# Patient Record
Sex: Female | Born: 1935 | Race: White | Hispanic: No | Smoking: Never smoker
Health system: Southern US, Community
[De-identification: ages and names within clinical notes are randomized; demographics above are authoritative.]

## PROBLEM LIST (undated history)

## (undated) DIAGNOSIS — E039 Hypothyroidism, unspecified: Secondary | ICD-10-CM

## (undated) DIAGNOSIS — M1990 Unspecified osteoarthritis, unspecified site: Secondary | ICD-10-CM

## (undated) DIAGNOSIS — E875 Hyperkalemia: Secondary | ICD-10-CM

## (undated) DIAGNOSIS — I10 Essential (primary) hypertension: Secondary | ICD-10-CM

## (undated) DIAGNOSIS — K449 Diaphragmatic hernia without obstruction or gangrene: Secondary | ICD-10-CM

## (undated) DIAGNOSIS — F419 Anxiety disorder, unspecified: Secondary | ICD-10-CM

## (undated) DIAGNOSIS — I5032 Chronic diastolic (congestive) heart failure: Secondary | ICD-10-CM

## (undated) DIAGNOSIS — I480 Paroxysmal atrial fibrillation: Secondary | ICD-10-CM

## (undated) DIAGNOSIS — D509 Iron deficiency anemia, unspecified: Secondary | ICD-10-CM

## (undated) DIAGNOSIS — E785 Hyperlipidemia, unspecified: Secondary | ICD-10-CM

## (undated) DIAGNOSIS — E669 Obesity, unspecified: Secondary | ICD-10-CM

## (undated) DIAGNOSIS — K219 Gastro-esophageal reflux disease without esophagitis: Secondary | ICD-10-CM

## (undated) DIAGNOSIS — E559 Vitamin D deficiency, unspecified: Secondary | ICD-10-CM

## (undated) HISTORY — DX: Vitamin D deficiency, unspecified: E55.9

## (undated) HISTORY — DX: Anxiety disorder, unspecified: F41.9

## (undated) HISTORY — DX: Unspecified osteoarthritis, unspecified site: M19.90

## (undated) HISTORY — DX: Obesity, unspecified: E66.9

## (undated) HISTORY — DX: Diaphragmatic hernia without obstruction or gangrene: K44.9

## (undated) HISTORY — DX: Hyperlipidemia, unspecified: E78.5

## (undated) HISTORY — DX: Essential (primary) hypertension: I10

## (undated) HISTORY — DX: Gastro-esophageal reflux disease without esophagitis: K21.9

## (undated) HISTORY — PX: CHOLECYSTECTOMY: SHX55

## (undated) HISTORY — DX: Paroxysmal atrial fibrillation: I48.0

---

## 1958-12-10 HISTORY — PX: TUBAL LIGATION: SHX77

## 1999-06-05 ENCOUNTER — Other Ambulatory Visit: Admission: RE | Admit: 1999-06-05 | Discharge: 1999-06-05 | Payer: Self-pay | Admitting: Family Medicine

## 2001-02-17 ENCOUNTER — Encounter: Payer: Self-pay | Admitting: Family Medicine

## 2001-02-17 ENCOUNTER — Ambulatory Visit (HOSPITAL_COMMUNITY): Admission: RE | Admit: 2001-02-17 | Discharge: 2001-02-17 | Payer: Self-pay | Admitting: Family Medicine

## 2001-12-23 ENCOUNTER — Other Ambulatory Visit: Admission: RE | Admit: 2001-12-23 | Discharge: 2001-12-23 | Payer: Self-pay | Admitting: Family Medicine

## 2005-04-20 ENCOUNTER — Encounter: Admission: RE | Admit: 2005-04-20 | Discharge: 2005-04-20 | Payer: Self-pay | Admitting: Family Medicine

## 2005-11-28 ENCOUNTER — Encounter: Admission: RE | Admit: 2005-11-28 | Discharge: 2005-11-28 | Payer: Self-pay | Admitting: Family Medicine

## 2006-04-22 ENCOUNTER — Ambulatory Visit: Payer: Self-pay | Admitting: Gastroenterology

## 2006-05-10 ENCOUNTER — Ambulatory Visit: Payer: Self-pay | Admitting: Gastroenterology

## 2006-05-31 ENCOUNTER — Encounter: Admission: RE | Admit: 2006-05-31 | Discharge: 2006-05-31 | Payer: Self-pay | Admitting: Family Medicine

## 2007-06-02 ENCOUNTER — Encounter: Admission: RE | Admit: 2007-06-02 | Discharge: 2007-06-02 | Payer: Self-pay | Admitting: Family Medicine

## 2007-09-06 ENCOUNTER — Emergency Department (HOSPITAL_COMMUNITY): Admission: EM | Admit: 2007-09-06 | Discharge: 2007-09-06 | Payer: Self-pay | Admitting: Emergency Medicine

## 2008-03-08 ENCOUNTER — Inpatient Hospital Stay (HOSPITAL_COMMUNITY): Admission: EM | Admit: 2008-03-08 | Discharge: 2008-03-17 | Payer: Self-pay | Admitting: Emergency Medicine

## 2008-03-12 ENCOUNTER — Ambulatory Visit: Payer: Self-pay | Admitting: Vascular Surgery

## 2008-03-12 ENCOUNTER — Encounter (INDEPENDENT_AMBULATORY_CARE_PROVIDER_SITE_OTHER): Payer: Self-pay | Admitting: *Deleted

## 2008-06-23 ENCOUNTER — Encounter: Admission: RE | Admit: 2008-06-23 | Discharge: 2008-06-23 | Payer: Self-pay | Admitting: Obstetrics and Gynecology

## 2008-09-22 ENCOUNTER — Ambulatory Visit (HOSPITAL_COMMUNITY): Admission: RE | Admit: 2008-09-22 | Discharge: 2008-09-22 | Payer: Self-pay | Admitting: Family Medicine

## 2009-02-01 IMAGING — CR DG CHEST 2V
2 series · 2 of 2 positions shown · non-contrast
Comparison: [DATE]

CLINICAL DATA: Cough, congestion, hypertension

CHEST - 2 VIEW

[w chest lat *]
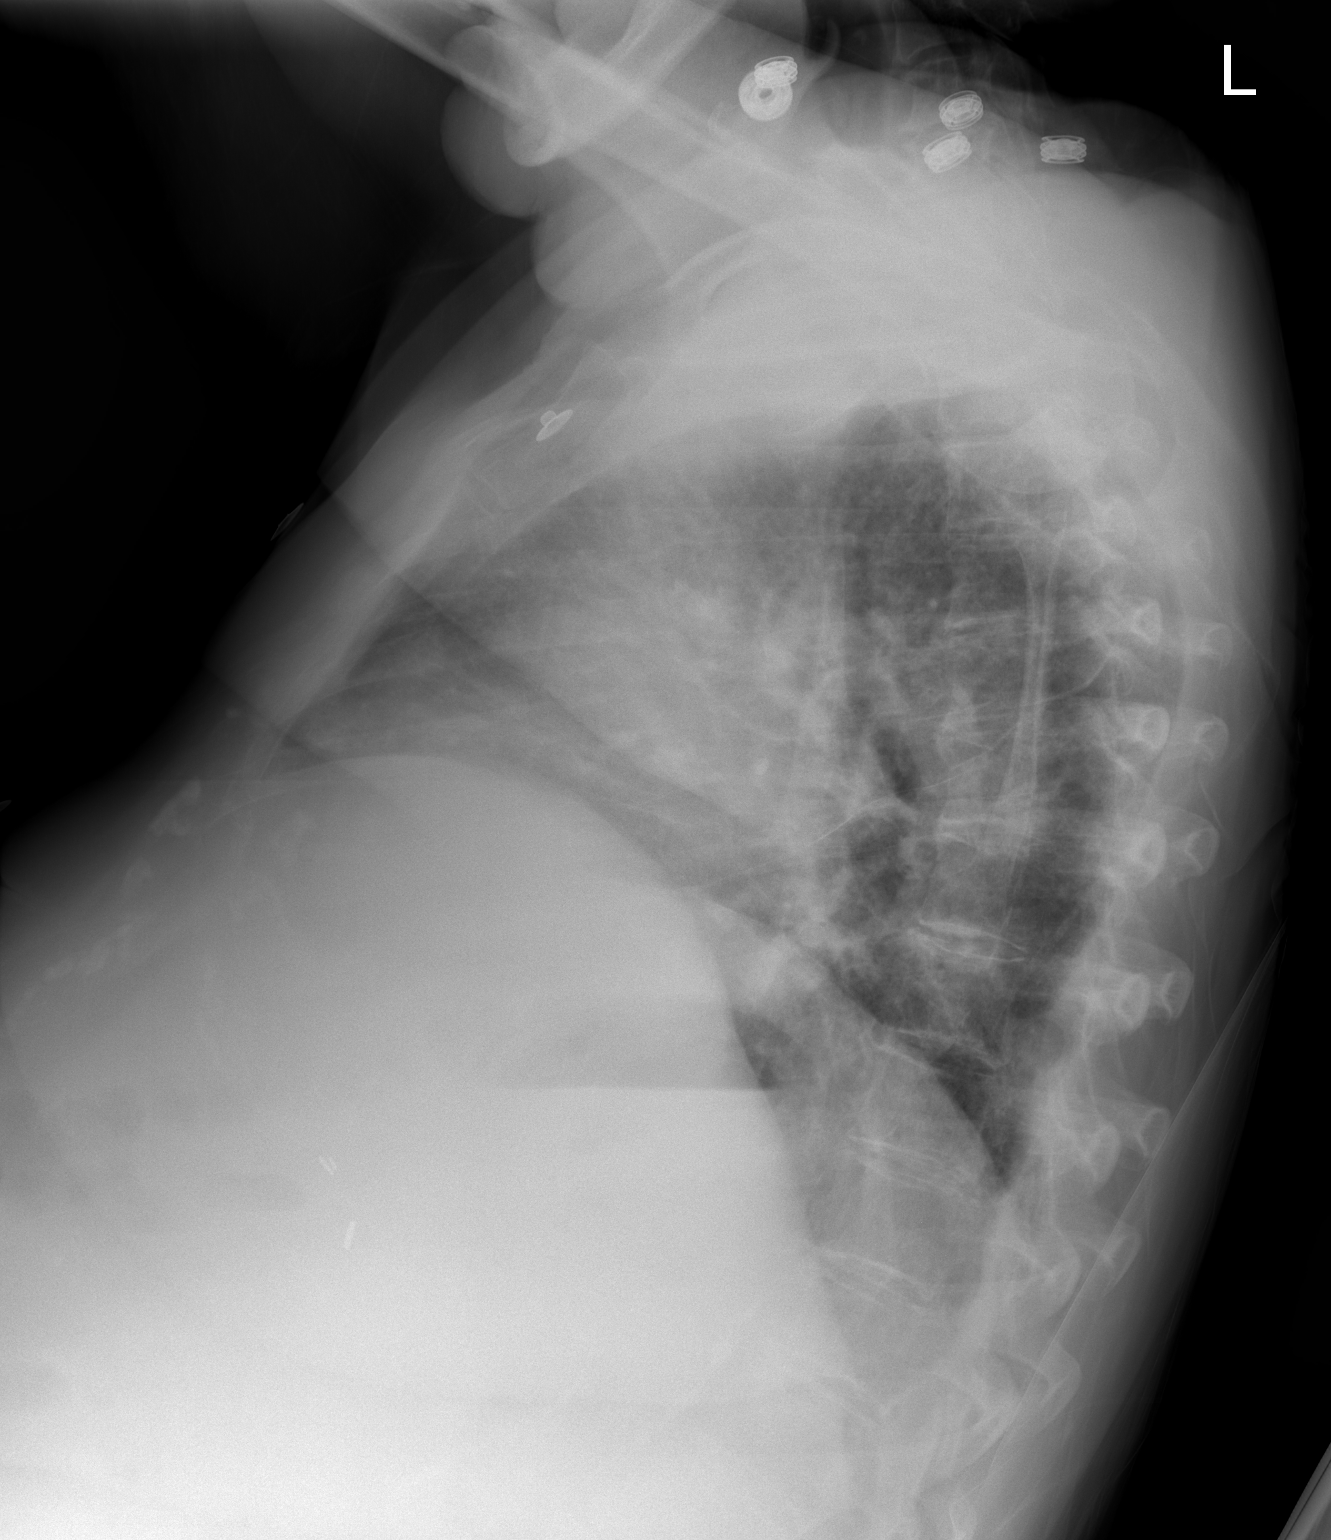

[view not recorded]
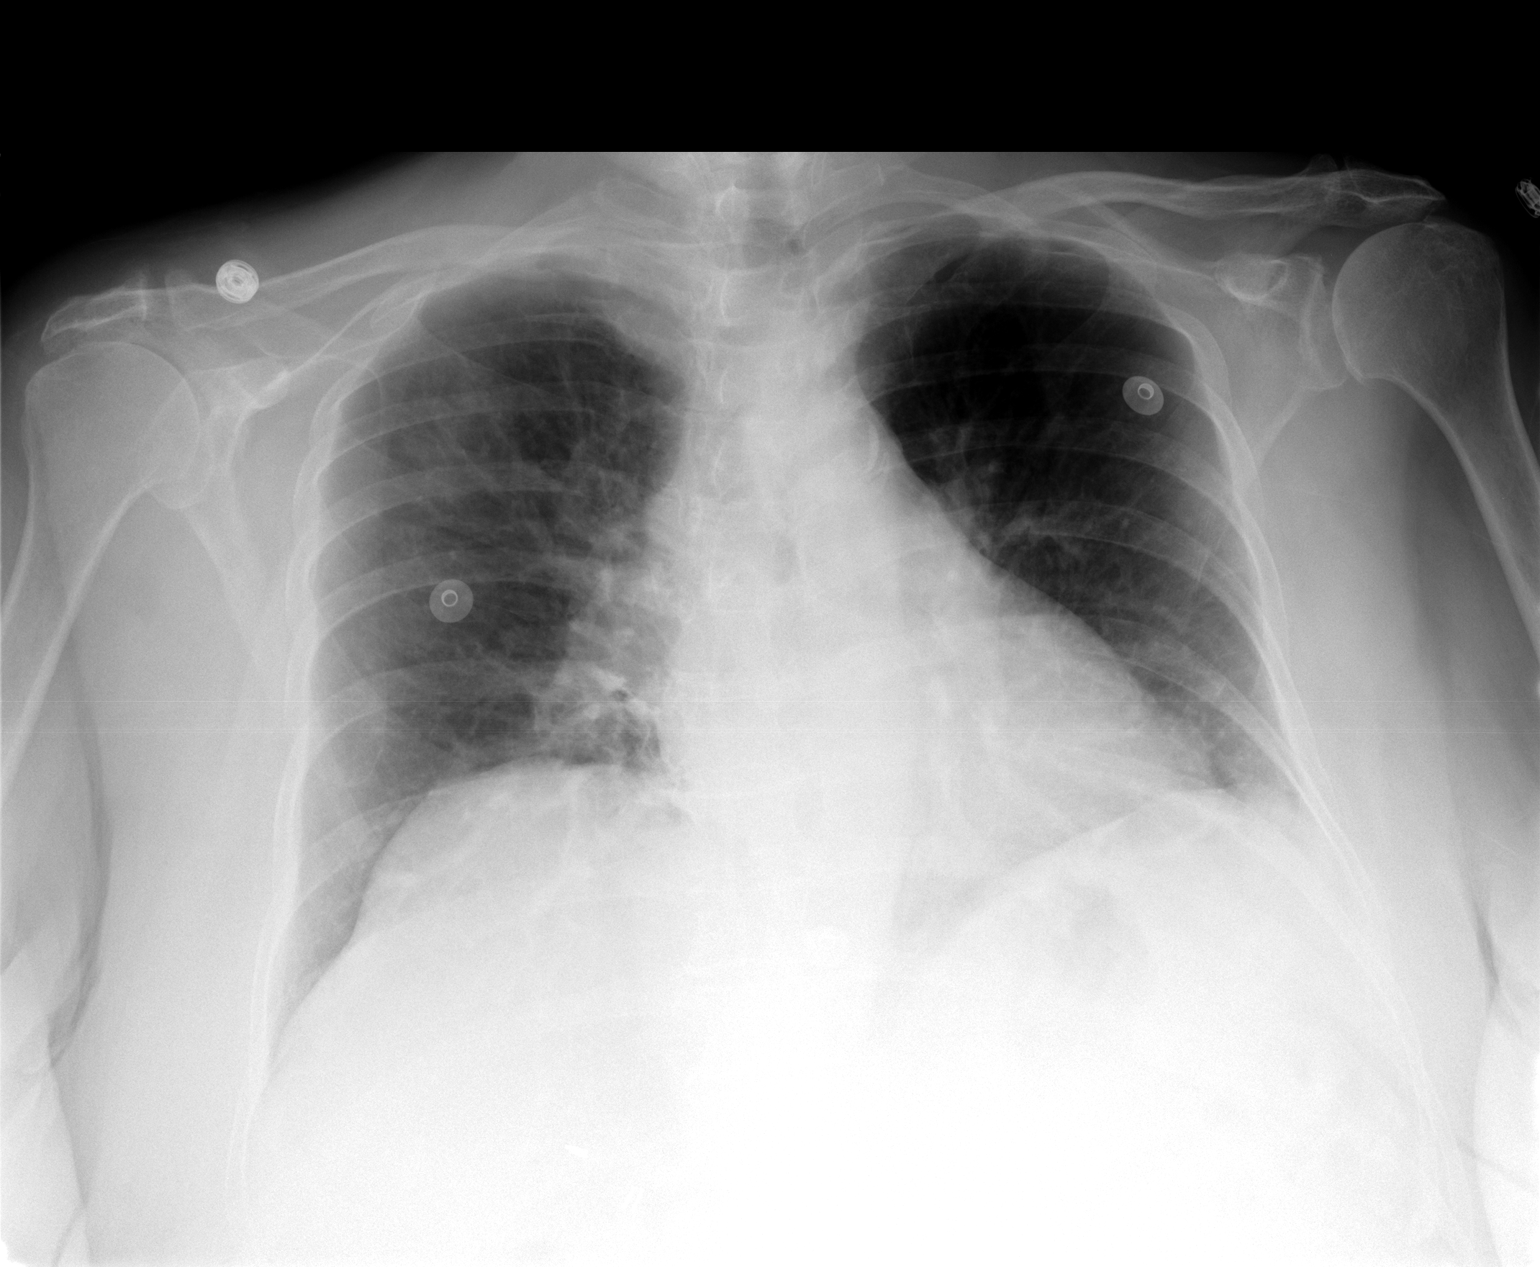

[2 of 2 positions shown; findings below may reference images not displayed]

FINDINGS: Low lung volumes, accentuating the cardiac and hilar
vascular markings.  Stable prominence of the right hilum.  Slight
increased density in the right upper lobe, developing infiltrate is
not excluded.  Bibasilar atelectasis..
IMPRESSION: Low volume exam.  Right upper lobe increased density, suspect
developing infiltrate or pneumonia.

Bibasilar atelectasis..

## 2009-06-27 ENCOUNTER — Encounter: Admission: RE | Admit: 2009-06-27 | Discharge: 2009-06-27 | Payer: Self-pay | Admitting: Obstetrics and Gynecology

## 2009-07-07 ENCOUNTER — Inpatient Hospital Stay (HOSPITAL_COMMUNITY): Admission: EM | Admit: 2009-07-07 | Discharge: 2009-07-11 | Payer: Self-pay | Admitting: Emergency Medicine

## 2010-12-31 ENCOUNTER — Encounter: Payer: Self-pay | Admitting: Obstetrics and Gynecology

## 2011-03-17 LAB — PROTIME-INR
INR: 2.2 — ABNORMAL HIGH (ref 0.00–1.49)
INR: 2.5 — ABNORMAL HIGH (ref 0.00–1.49)
Prothrombin Time: 29.1 seconds — ABNORMAL HIGH (ref 11.6–15.2)

## 2011-03-17 LAB — BASIC METABOLIC PANEL
BUN: 4 mg/dL — ABNORMAL LOW (ref 6–23)
BUN: 6 mg/dL (ref 6–23)
CO2: 22 mEq/L (ref 19–32)
Calcium: 8.7 mg/dL (ref 8.4–10.5)
Chloride: 103 mEq/L (ref 96–112)
Creatinine, Ser: 0.83 mg/dL (ref 0.4–1.2)
Creatinine, Ser: 0.95 mg/dL (ref 0.4–1.2)
GFR calc Af Amer: >60 mL/min (ref >60)
Glucose, Bld: 95 mg/dL (ref 70–99)
Glucose, Bld: 95 mg/dL (ref 70–99)
Sodium: 134 mEq/L — ABNORMAL LOW (ref 135–145)

## 2011-03-17 LAB — CBC
MCV: 86.5 fL (ref 78.0–100.0)
WBC: 4.4 10*3/uL (ref 4.0–10.5)

## 2011-03-18 LAB — COMPREHENSIVE METABOLIC PANEL
ALT: 22 U/L (ref 0–35)
AST: 31 U/L (ref 0–37)
Alkaline Phosphatase: 76 U/L (ref 39–117)
CO2: 26 mEq/L (ref 19–32)
Calcium: 9 mg/dL (ref 8.4–10.5)
Chloride: 78 mEq/L — CL (ref 96–112)
GFR calc Af Amer: 58 mL/min — ABNORMAL LOW (ref >60)
Glucose, Bld: 95 mg/dL (ref 70–99)
Total Protein: 7 g/dL (ref 6.0–8.3)

## 2011-03-18 LAB — BASIC METABOLIC PANEL
BUN: 10 mg/dL (ref 6–23)
BUN: 19 mg/dL (ref 6–23)
CO2: 29 mEq/L (ref 19–32)
CO2: 30 mEq/L (ref 19–32)
Calcium: 8.4 mg/dL (ref 8.4–10.5)
Calcium: 8.9 mg/dL (ref 8.4–10.5)
Calcium: 8.9 mg/dL (ref 8.4–10.5)
Calcium: 9 mg/dL (ref 8.4–10.5)
Calcium: 9.2 mg/dL (ref 8.4–10.5)
Chloride: 78 mEq/L — CL (ref 96–112)
Creatinine, Ser: 1 mg/dL (ref 0.4–1.2)
Creatinine, Ser: 1.04 mg/dL (ref 0.4–1.2)
GFR calc Af Amer: >60 mL/min (ref >60)
GFR calc Af Amer: >60 mL/min (ref >60)
GFR calc non Af Amer: 51 mL/min — ABNORMAL LOW (ref >60)
GFR calc non Af Amer: 54 mL/min — ABNORMAL LOW (ref >60)
GFR calc non Af Amer: 54 mL/min — ABNORMAL LOW (ref >60)
GFR calc non Af Amer: 55 mL/min — ABNORMAL LOW (ref >60)
GFR calc non Af Amer: >60 mL/min (ref >60)
Glucose, Bld: 97 mg/dL (ref 70–99)
Glucose, Bld: 98 mg/dL (ref 70–99)
Potassium: 3.2 mEq/L — ABNORMAL LOW (ref 3.5–5.1)
Potassium: 3.5 mEq/L (ref 3.5–5.1)
Potassium: 3.6 mEq/L (ref 3.5–5.1)
Potassium: 4.6 mEq/L (ref 3.5–5.1)
Potassium: 4.6 mEq/L (ref 3.5–5.1)
Sodium: 116 mEq/L — CL (ref 135–145)
Sodium: 117 mEq/L — CL (ref 135–145)
Sodium: 120 mEq/L — ABNORMAL LOW (ref 135–145)
Sodium: 124 mEq/L — ABNORMAL LOW (ref 135–145)
Sodium: 127 mEq/L — ABNORMAL LOW (ref 135–145)
Sodium: 131 mEq/L — ABNORMAL LOW (ref 135–145)

## 2011-03-18 LAB — CARDIAC PANEL(CRET KIN+CKTOT+MB+TROPI)
CK, MB: 1.6 ng/mL (ref 0.3–4.0)
CK, MB: 2 ng/mL (ref 0.3–4.0)
Relative Index: INVALID (ref 0.0–2.5)
Total CK: 48 U/L (ref 7–177)
Total CK: 59 U/L (ref 7–177)
Troponin I: 0.02 ng/mL (ref 0.00–0.06)

## 2011-03-18 LAB — DIC (DISSEMINATED INTRAVASCULAR COAGULATION)PANEL
Fibrinogen: 485 mg/dL — ABNORMAL HIGH (ref 204–475)
aPTT: 71 seconds — ABNORMAL HIGH (ref 24–37)

## 2011-03-18 LAB — AMMONIA: Ammonia: 29 umol/L (ref 11–35)

## 2011-03-18 LAB — CBC
HCT: 25.4 % — ABNORMAL LOW (ref 36.0–46.0)
HCT: 30.3 % — ABNORMAL LOW (ref 36.0–46.0)
Hemoglobin: 8.7 g/dL — ABNORMAL LOW (ref 12.0–15.0)
MCHC: 34 g/dL (ref 30.0–36.0)
Platelets: 151 10*3/uL (ref 150–400)
Platelets: 154 10*3/uL (ref 150–400)
RBC: 3.56 MIL/uL — ABNORMAL LOW (ref 3.87–5.11)
RDW: 16.8 % — ABNORMAL HIGH (ref 11.5–15.5)
RDW: 16.9 % — ABNORMAL HIGH (ref 11.5–15.5)
WBC: 4.1 10*3/uL (ref 4.0–10.5)

## 2011-03-18 LAB — PROTIME-INR
INR: 2.5 — ABNORMAL HIGH (ref 0.00–1.49)
Prothrombin Time: 36.2 seconds — ABNORMAL HIGH (ref 11.6–15.2)

## 2011-03-18 LAB — HEMOCCULT GUIAC POC 1CARD (OFFICE): Fecal Occult Bld: POSITIVE

## 2011-03-18 LAB — CORTISOL: Cortisol, Plasma: 16 ug/dL

## 2011-04-24 NOTE — H&P (Signed)
Katherine Tyler, Katherine Tyler                   ACCOUNT NO.:  [id number]   MEDICAL RECORD NO.:  [id number]          PATIENT TYPE:  EMS   LOCATION:  ED                           FACILITY:  [facility]   PHYSICIAN:  Lucita Ferrara, MD         DATE OF BIRTH:  [birth year]   DATE OF ADMISSION:  03/08/2008  DATE OF DISCHARGE:                              HISTORY & PHYSICAL   PRIMARY CARE PHYSICIAN:  Dr. Gennette Pac at [facility], last seen 1 month ago.   CHIEF COMPLAINT:  Falls and weakness, abdominal pain.   HISTORY OF PRESENT ILLNESS:  Katherine Tyler is a 77 year old female brought in  by her family members to [facility] with chief complaint  of worsening left lower abdominal pain that has been going on the not  for the last 3 days.  She has also been complaining of falls that has  intractable for the last 1 month. Falls accompanied by generalized  weakness, most occur after she suddenly gets out of her chair and she  denies any prodrome such as chest pain, shortness of breath.  There is  no noted or witnessed seizure activity such as a urinary incontinence  bowel incontinence, foaming at the mouth, or seizure-type activity.  There is also no focal weakness on one side of the body, and no changes  in her visual acuity or her speech content or character.  She states  that she has been falling mostly on her tail bone and her buttocks.  She  denies hitting her head.  She was last seen 1 month ago for generalized  check-up.  She denies a history of anemia.  She states that she is a  chronic drinker.  She has been drinking for the last 30-40 years.  30  years ago, apparently she had been diagnosed with having cirrhosis of  the liver.  This is what was told to her.  She never followed up for  complications secondary to cirrhosis.  She has never had an EGD  colonoscopy.  She denies a history of hemorrhoids she also denies any  fevers, chills.  She states that she has had some  diarrhea stools.  They  also have been black in color.  She denies hematemesis.  She also denies  coffee ground vomitus.  Otherwise 12 point review of systems is  negative.   PAST MEDICAL HISTORY:  1. Significant for hypertension.  2. Gastroesophageal reflux disease.  3. Anxiety.  4. Chronic alcohol intake.  She states she takes so she drinks a half      a pint of alcohol per day.  She is also taking aspirin 81 mg per      day.   ALLERGIES:  No known drug allergies.   MEDICATIONS:  Include Diovan hydrochlorothiazide 160/12.5 one tablet  p.o. daily, Nexium 40 mg daily, Xanax 0.5 mg twice a day, aspirin 81 mg  once daily.   ALLERGIES:  She has no known drug allergies.   SOCIAL HISTORY:  She denies drugs.  She also  denies tobacco.  She drinks  1/2 pint per day.   PAST SURGICAL HISTORY:  There is no mention of any surgeries in the  past.   PHYSICAL EXAMINATION:  Generally, she is a pleasant obese Caucasian  female in no acute distress.  She is talkative and alert, oriented x3,  answers questions appropriately.  Her blood pressure is 130/64, pulse 99, respirations 20, temperature  97.5.  HEENT: Mucous membranes were dry, extraocular muscles intact.  CARDIOVASCULAR:  S1, S2.  Regular rhythm with no rubs or clicks.  Abdomen is the is soft with tenderness generalized with no distention.  There is no hepatosplenomegaly.  RECTAL:  Sphincter tone is good.  Stool is dark in color.  Guaiac is  positive.  NEURO:  Patient is alert, oriented x3.  Cranial nerves 2-12 grossly  intact.  Strength is 5/5 bilateral upper and lower extremities.   EKG shows sinus tachycardia with the right bundle-branch block and T-  wave abnormalities in the lateral wall nonspecific.  ST-T wave changes.  Urinalysis shows negative leukocyte esterase and nitrites positive 100  blood.  Complete metabolic panel sodium 131, potassium 4.4, chloride 90,  CO2 18, BUN 59, creatinine 7.18, alk phos 140, a total bili was  4.6,  albumin low at 2.63, ALT 1151 and calcium is 8.7.  CBC shows a white  count of 8.0 hemoglobin 0.7, hematocrit 30.2 and platelets of 156,000.   ASSESSMENT/PLAN:  A 77 year old; nausea, vomiting, dehydration and  prerenal azotemia, metabolic derangement consistent with non-metabolic  acidosis is being admitted for weakness and falls.  1. Nausea, vomiting and black stools with diarrhea.  Given history of      alcohol abuse and current alcohol intake go ahead and rule out GI      bleed.  We will go ahead and hydrate the patient and repeat H&H,      type and cross and hold 2 units of packed red blood cells.  Check      INR as p.o. as needed.  IV fluids, strict Is and Os, IV antiemetics      Protonix 40 b.i.d. stool for C diff given diarrhea, abdominal      ultrasound and stool for occult blood.  2. Acute renal failure secondary to volume contraction/depletion and      the patient being on hydrochlorothiazide as a contributing factor.      Will go then hold the ACE and/or ARB.  No contrast over nephrotoxic      agents bilateral renal ultrasound strict Is and Os, daily weights      and if creatinine shows no improvement and/or patient's oliguric,      will proceed with a nephrology consultation.  3. Alcohol abuse of 1/2 pints per day.  Ativan protocol initiated and      [facility] 4/4, will go ahead and for completeness check urine drug      screen and blood alcohol level.  Obviously fall precautions.  4. Weakness/falls.  This could really be multifactorial, could be      secondary to #1, #2, or #3 such as the patient is intoxicated.      Will proceed with the PT/OT once the patient is stable.  5. If the patient truly does have a significant anemia and stool for      guaiac is positive the patient will need GI consultation during      this admission.  6. Deep vein thrombosis prophylaxis with a SCD, and GI prophylaxis  with Protonix.  Code status and ethics the patient is Full  Code.      Lucita Ferrara, MD  Electronically Signed     RR/MEDQ  D:  03/08/2008  T:  03/08/2008  Job:  [id number]

## 2011-04-24 NOTE — Discharge Summary (Signed)
Katherine Tyler, Katherine Tyler                   ACCOUNT NO.:  [id number]   MEDICAL RECORD NO.:  [id number]          PATIENT TYPE:  INP   LOCATION:  1433                         FACILITY:  [facility]   PHYSICIAN:  Mobolaji B. Bakare, M.D.DATE OF BIRTH:  11/30/1929   DATE OF ADMISSION:  03/08/2008  DATE OF DISCHARGE:  03/16/2008                               DISCHARGE SUMMARY   PRIMARY CARE PHYSICIAN:  Unassigned;  Dr. Gennette Pac at [facility].   FINAL DIAGNOSES:  1. Alcohol withdrawal.  2. Nausea, vomiting, diarrhea.  3. Alcoholic hepatitis.  4. Acute renal failure, resolved.  5. Microcytic anemia secondary to alcohol.  6. Thrombocytopenia secondary to alcohol.  7. Right knee gout-pseudogout.  8. Degenerative joint disease, right knee.  9. Probable right upper lobe pneumonia.  10.Hypokalemia.  11.Hypomagnesemia.  12.Left lower extremity deep vein thrombosis.  13.Asymptomatic gallstones.  14.Prolonged QTC interval.  15.Probable endometrial polyp.   The patient is to follow up with Dr. Sandria Manly ( [facility], telephone number [phone]).   PROCEDURES:  1. X-ray lumbar spine showed spondylosis without fracture or      subluxation.  2. X-ray of the pelvis shows no acute findings, degenerative changes      in the spine and hips.  3. Ultrasound of the kidneys showed no hydronephrosis, bilateral renal      cortical thinning, simple or mildly complex cyst in the right      kidney.  There is fluid in the endometrial canal with possible      polyp.  4. Chest x-ray done on March 10, 2008, showed no acute abnormality.  5. Ultrasound of the abdomen done on March 11, 2008, showed      splenomegaly and probable hepatomegaly.  There was no      hydronephrosis, possible gallstones. The gallbladder is not well      visualized.  6. Ultrasound of the pelvis showed irregular polypoid-like lesion      arising from the endometrium.  Endometritis cannot be  excluded      neither endometrial polyp. The ovid cannot identified.  There is      ascites.  7. Followup chest x-ray done on March 14, 2008, showed limited      examination right upper lobe increased density, suspect developing      infiltrate or pneumonia.  8. Right knee x-ray done on March 15, 2008, showed moderate to large      knee joint effusion, tricompartment degenerative changes with      possible large loose body anteriorly. No acute findings      demonstrated.  9. Lower extremity Dopplers done on March 12, 2008, showed DVT in the      popliteal posterior tibial veins on the left. No evidence of DVT on      the right.   BRIEF HISTORY:  Please refer to the admission H&P dictated by Dr. Flonnie Overman.  In brief, Katherine Tyler is a 77 year old Caucasian female who presented with  nausea, vomiting, dehydration, acute renal failure and acidosis.  She  was falling  at home just prior to hospitalization.  She was extremely  weak.  The patient drinks alcohol about 1/2 pint of liquor a day.  She  was in incipient alcohol withdrawal at the time of admission.  She  eventually went into full withdrawal during the course of  hospitalization with tachycardia and confusion.   HOSPITAL COURSE:  #1.  NAUSEA, VOMITING, DIARRHEA:  This was managed symptomatically with  IV fluid and Phenergan. Stool samples were sent, and occult blood was  negative.  Unfortunately,  no C. difficile was obtainable.  Nausea,  vomiting and diarrhea have resolved with symptomatic management.  This  is probably related to a viral infection.   #2.  ACUTE RENAL FAILURE:  The patient responded well to IV fluids.  She  had non-anion gap metabolic acidosis.  She was given bicarb infusion.  At the time of dictation, BUN was 14, creatinine 1.15, essentially  normal.  The patient was well hydrated.   #3.  ALCOHOLIC HEPATITIS:  She had abnormal liver enzymes, and this was  in keeping with alcohol-induced abnormality.  She had ultrasound  of the  gallbladder which showed probable gallstones, but CBD was normal.  Hepatitis panel was negative for hepatitis B and C serology.  At the  time of dictation, AST and ALT have improved to 93 and 40, respectively.  Total bilirubin is 2.2, and alkaline phosphatase is normal at 106.   #4.  BI-CYTOPENIA:  The patient had an anemia and thrombocytopenia, both  felt to be secondary to alcohol.  She had an anemia panel which showed  ferritin of 804 and TIBC of 172, normal iron of 94, percent saturation  was 55.  Vitamin B12 was 778, and folate was greater than 20.   #5.  ALCOHOL WITHDRAWAL:  The patient was placed on Ativan withdrawal  protocol.  She did fairly well, and tachycardia has subsided.  She was  placed on multivitamin, thiamine as well.  She will continue with these  as outpatient.  She no will need followup alcohol rehabilitation.   #6.  RIGHT KNEE DEGENERATIVE JOINT DISEASE, GOUT-PSEUDOGOUT:  The  patient developed acute right knee swelling, pain, and warmth.  X-ray of  the knee indicated degenerative joint disease with probable loose body.  She had light effusion.  At this time, the patient was fully  anticoagulated; hence, aspiration of the joint was not done.  She was  started on IV Solu-Medrol 20 mg q.6 h, and this will be transitioned to  a short course of prednisone.  She is also on colchicine 4.6 mg daily.   #7.  ELECTROLYTE IMBALANCE:  she had hypokalemia and hypomagnesemia.  These were corrected. In addition, she was noted to have prolonged QTC  interval, and this is trending downwards.  At the time of admission, the  patient's QTC interval was 630; the most recent QTC interval done on  April 5 was 513.  The patient is asymptomatic.   #8.  LEFT LOWER EXTREMITY DEEP VEIN THROMBOSIS:  The patient was noted  to have increased swelling in her left lower extremity.  An ultrasound  revealed popliteal and tibial vein deep vein thrombosis.  The patient  was started on  Lovenox and Coumadin.  At the time of dictation,  PT and  INR were 37.5 and 3.6, respectively.  Coumadin is being held today.  Further adjustment to Coumadin will be made prior to discharge. The  patient will need 3-6 months of treatment at the discretion of his  primary care physician.   #9.  ENDOMETRIAL POLYP:  This was noted on routine ultrasound of the  abdomen, and this was followed up by specific ultrasound of the pelvis.  Endometrial polyp was suspected, but endometrial cancer could not be  excluded.  Hence, discussion was held with Dr. Sandria Manly. on call for Wonda Olds on this particular day [facility],  telephone number [phone]).  Patient is to follow up with him in 1-2  weeks post discharge.  There would be sufficient coordination in view of  anticoagulation before any procedure can be done.   #10.  PROBABLE RIGHT UPPER LOBE PNEUMONIA:  The patient has abnormal  lung sounds; hence,  chest x-ray was obtained.  There was a subtle  change from previous chest x-ray suspicious for developing new  infiltrate.  She was started on Zosyn.  This will be transitioned to  Augmentin to complete 7 days of treatment.   DISPOSITION:  The patient was seen by PT and OT, and recommendation a  skilled nursing facility placement.   DISCHARGE MEDICATIONS:  1. Xanax 0.5 mg p.o. b.i.d.  2. Colchicine 0.6 mg p.o. daily.  3. Prednisone 40 mg daily through March 19, 2008.  4. Multivitamin one p.o. daily.  5. Protonix 40 mg p.o. daily.  6. Augmentin 875 mg p.o. b.i.d. through March 21, 2008.  7. Thiamine 100 mg daily.  8. Coumadin dosing to be included at the time of discharge.  9. Albuterol 2.5 mg q. 2 h p.r.n.  10.Vicodin one to two p.o. q. 4 h p.r.n. pain.   DISCHARGE LABORATORY DATA:  Magnesium 1.8, potassium 4.4. White cell  4.2, hemoglobin 9.8, hematocrit 28, MCV 104, platelets 68. Acute  hepatitis panel negative.  Uric acid level 9.8.  Sodium 137, chloride  103, CO2  26, glucose 95, BUN 14, creatinine 1.15.  Alkaline phosphatase  109, total bilirubin 2.2, AST 93, ALT 42, total protein 12.2,  albumin  2.1, calcium 7.3.  Fecal occult blood was negative.   RECOMMENDATIONS:  1. Continue PT, OT at the skilled nursing facility.  2. Check BMET and magnesium 1 week post discharge.  3. Follow up with Dr. Sandria Manly at [facility],      telephone number [phone], two  weeks post discharge.      Mobolaji B. Corky Downs, M.D.  Electronically Signed     MBB/MEDQ  D:  03/16/2008  T:  03/16/2008  Job:  [id number]   cc:   Dr. Gennette Pac  [facility]

## 2011-04-24 NOTE — Discharge Summary (Signed)
Katherine Tyler, Katherine Tyler                   ACCOUNT NO.:  [id number]   MEDICAL RECORD NO.:  [id number]          PATIENT TYPE:  INP   LOCATION:  3004                         FACILITY:  [facility]   PHYSICIAN:  Lonia Blood, M.D.DATE OF BIRTH:  Mar 30, 1929   DATE OF ADMISSION:  07/07/2009  DATE OF DISCHARGE:  07/11/2009                               DISCHARGE SUMMARY   PRIMARY CARE PHYSICIAN:  [facility].   DISCHARGE DIAGNOSES:  1. Severe hypokalemia ischemia severe hyponatremia.      a.     Sodium 113 initially.      b.     Felt to be dehydration and diuretic-therapy related.      c.     Completely resolved with sodium 134 at discharge.      d.     Diuretic therapy discontinued.  2. Deep venous thrombosis, on chronic Coumadin therapy.  3. Hypertension.  4. Gout.  5. Status post cholecystectomy.  6. 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      a.     Discontinued alcohol abuse 1 year ago.      b.     LFTs normal.  7. Presbyesophagus:  Calcium channel blocker trial initiated.  8. Normocytic anemia.      a.     Studies suggestive of iron deficiency.      b.     Hemoglobin 8.7 but stable.      c.     Guaiac positive.      d.     Recommended outpatient screening colonoscopy if not       accomplished within the past 2 years.  9. Allergy to [facility].   DISCHARGE MEDICATIONS:  1. Cardizem CD 180 mg p.o. daily  2. Vitamin B1 100 mg p.o. daily.  3. RioCite Plus 1 capsule p.o. daily  4. Coumadin 3 mg on Tuesday, Thursday, Saturday and Sunday and 4.5 mg      on Monday, Wednesday, Friday.  5. Hydrocodone/acetaminophen 5/500 one p.o. q.6h. p.r.n. severe low      back pain.  6. Alprazolam 0.5 mg p.o. t.i.d. p.r.n. anxiety.  7. Hyoscyamine 0.125 mg p.o. t.i.d.  8. Magnesium oxide 400 mg p.o. daily.  9. Colchicine 0.6 mg p.o. daily   FOLLOW UP:  The patient is advised to follow up with her primary care  physician at [facility] family practice in 3 to 5 days.   It is  recommended that a BMET be obtained at that time to reassess the  patient's sodium.  As discussed above, it is 134 at the time of her  discharge.  Additionally, the patient's INR will need to be assessed at  that time, given her chronic Coumadin therapy.  Furthermore,  arrangements should be made to schedule an outpatient colonoscopy for  evaluation of this patient's guaiac-positive normocytic anemia unless  the patient has undergone a recent colonoscopy in the outpatient  setting.   PROCEDURE:  Barium esophagram July 08, 2009:  Esophageal dysmotility  most consistent with presbyesophagus.  Small hiatal hernia, mild  gastroesophageal reflux.  CONSULTATIONS:  None.   HOSPITAL COURSE:  Ms. Katherine Tyler is a very pleasant 77 year old female  who presented to her primary care physician with complaints of  generalized lethargy.  Lab values were obtained through the office, and  the patient was found to have a sodium of 113.  She was therefore  contacted and told to report to the emergency room at the hospital.  In  the emergency room, the patient complained of ongoing severe lethargy.  She was admitted to the hospital for acute evaluation and treatment of  her hyponatremia.   LABORATORY STUDIES:  Osmolality was assessed.  Ultimately final  diagnosis was a severe hyponatremia due to volume depletion as well as  ongoing diuretic therapy.  All diuretics were discontinued.  The patient  was volume-resuscitated using crystalloid isotonic solution.  The  patient tolerated this without difficulty.  The patient's sodium level  improved nicely.  The patient was followed to assure that her sodium did  not correct greater than 12 mEq per 24-hour period.  With improvement of  the patient's sodium to greater than 130, the patient's fatigue  resolved.  She was able to ambulate without difficulty.  Mental status  was at baseline.  The patient, though originally orthostatic,  experienced no  further orthostasis or hypotension after her volume was  replaced.   The patient does have a history of chronic DVT and is on chronic  Coumadin therapy.  Throughout the hospital stay, no complications were  encountered concerning her Coumadin therapy.  INR remained therapeutic  during her stay.  She is advised to follow up with her primary care  physician for ongoing close monitoring of her Coumadin therapy.  No  changes were made to her dosing regimen, and no complications were  encountered.  She is allowed to keep her previously scheduled follow-up  appointments concerning her anticoagulation.   During the patient's hospital stay, as her fatigue began to improve, the  patient also complained of some dysphagia and odynophagia.  As a result,  a barium esophagram was carried out.  Findings were most consistent with  presbyesophagus.  Calcium channel blocker was initiated empirically.  The patient tolerated this without difficulty.  She in effect  experienced significant improvement in her symptoms.  In the future,  should the patient's symptoms return, consideration could be given to  adding a proton pump inhibitor and possibly Reglan therapy.   During the patient's hospital stay, a significant anemia was  appreciated.  With hydration, the patient's hemoglobin stabilized at  approximately 8.7.  MCV was 86.  Stools were guaiaced and found to be  trace positive.  Review of old records suggested the patient has a  history of chronic iron deficiency anemia.  The extent to which she has  undergone an outpatient workup is not clear.  The patient is advised  that she would benefit from an outpatient screening colonoscopy if she  has not received one in the last 2 years.  In that the patient's  hemoglobin is stable, and there is no evidence of significant blood  loss, it is not felt that prolongation of inpatient stay is necessary.  Outpatient evaluation is, however, felt to be appropriate.    On July 11, 2009, the patient was deemed to be stable for discharge  home.  Vital signs were stable.  She was afebrile.  Sodium level was  stable at 134, and INR was 2.5.      Lonia Blood, M.D.  Electronically  Signed     Lonia Blood, M.D.  Electronically Signed    JTM/MEDQ  D:  07/11/2009  T:  07/11/2009  Job:  [id number]   cc:   [facility]

## 2011-04-24 NOTE — H&P (Signed)
Katherine Tyler, Katherine Tyler                   ACCOUNT NO.:  [id number]   MEDICAL RECORD NO.:  [id number]          PATIENT TYPE:  INP   LOCATION:  4732                         FACILITY:  [facility]   PHYSICIAN:  Eduard Clos, MDDATE OF BIRTH:  Nov 29, 1929   DATE OF ADMISSION:  07/07/2009  DATE OF DISCHARGE:                              HISTORY & PHYSICAL   PRIMARY CARE PHYSICIAN:  [facility].   CHIEF COMPLAINT:  Abnormal labs, sodium very low.   HISTORY OF PRESENT ILLNESS:  A 77 year old female with history of  cirrhosis secondary to alcohol, history of DVT on Coumadin, iron  deficiency anemia, chronic pain, hypertension was having fatigue over  the last few weeks, had gone to her primary care physician yesterday.  Had done some labs and was found to have intense severe hyponatremia  around 113 and was told to come to the hospital.  Presently, the patient  is in the hospital, is only complaining of fatigue and weakness, but she  is unable to walk well.  Denies any loss of consciousness, dizziness,  chest pain, shortness of breath, weakness of limbs, nausea, vomiting.  Does have difficulty swallowing and notes when she eats something, she  has pain.  At this time, labs have been ordered.  EKG done did not show  any acute changes except for nonspecific ST changes, particularly in the  inferior leads.  Reviewed following cardiac enzymes.  The patient has  been admitted for further observation and management.   PAST MEDICAL HISTORY:  1. Cirrhosis of liver second alcohol.  2. DVT on Coumadin.  3. Chronic pain.  4. Hypertension.  5. Gout.   PAST SURGICAL HISTORY:  Cholecystectomy.   ALLERGIES:  Keflex.   MEDICATIONS ON ADMISSION:  1. Aspirin 325 mg daily.  2. Zoloft 50 mg daily.  3. Coumadin 3 mg.  4. Vicodin 5/500 p.o. q.6 h p.r.n. pain.  5. Alprazolam 0.5 mg p.o. daily p.r.n. for anxiety.  6. Norvasc 10 mg daily.  7. Avalide 300 mg/2.5 international units  daily.  8. Omeprazole 20 mg p.o. daily.  9. Maxzide 40 mg p.o. daily.  10.Lasix 20 mg p.o. b.i.d.  11.Vitamin B1 p.o. daily.  12.Hemocyte Plus b.i.d.  13.Levsin 0.125 p.r.n.  14.Colchicine 0.6 mg p.o. daily.   FAMILY HISTORY:  Noncontributory.   SOCIAL HISTORY:  The patient quit drinking alcohol a year ago.  Denies  smoking cigarettes or drugs.  Living in close proximity with her family  who checks on her every day.  The patient lives by herself.  Sometimes  the family cooks for her, sometimes she is mostly independent.   REVIEW OF SYSTEMS:  As per history of present illness, nothing else  significant.   PHYSICAL EXAMINATION:  GENERAL:  The patient examined at bedside not in  acute distress.  VITAL SIGNS:  Blood pressure 114/80 on standing is 109/70, pulse 80,  temperature 97.4, respirations 18, O2 sat 27%.  HEENT:  Anicteric.  No pallor.  CHEST:  Bilateral air entry present.  No rhonchi, no crepitation.  HEART:  S1, S2 heard.  ABDOMEN:  Soft, nontender.  Bowel sounds heard.  CNS:  Patient alert and oriented to time, place and person.  Moves upper  and lower extremities 5/5.  EXTREMITIES:  Peripheral pulses felt.  No edema.   LABORATORY DATA:  CBC, the patient had a WBC of 5.2, hemoglobin 10.1,  hematocrit 30.3, platelets 172.  C-met:  Urine sodium, urine lytes,  urine osmolality has been ordered, we await for the results.  EKG as  described did not show anything acute except for nonspecific ST wave  changes.   FINAL DIAGNOSES:  1. Severe hyponatremia.  At this time, probably from dehydration, but      could be also related to effects of hydrochlorothiazide which will      be discontinuing.  2. Normocytic normochromic anemia.  3. History of alcoholic liver cirrhosis.  4. Hypertension.  5. Dysphagia.   PLAN:  1. We will admit the patient to telemetry as the patient has some      chest pain which most likely is secondary to dysphagia.  We will      check cardiac enzymes  and if negative, probably discontinue      telemetry after that.  2. Will check B-mets frequently.  For next 24 hours, check urine      sodium, urine chloride, urine osmolality, serum osmolality, and      further recommendation of her hyponatremia will be based on these      results and how her B-met progresses.  For now, I am going to start      the patient on IV normal sinus 70 cc considering that she may be      dehydrated as she seems to be fatigued.  3. Will stop hydrochlorothiazide and continue most of her other      medications.  Coumadin will be followed by      pharmacy for DVT.  4. Check anemia profile, stool for occult blood for her anemia.  At      this time, the patient has no symptoms of any SBP, so no      antibiotics have been started, further recommendation as condition      evolves.      Eduard Clos, MD  Electronically Signed     Eduard Clos, MD  Electronically Signed    ANK/MEDQ  D:  07/07/2009  T:  07/07/2009  Job:  [id number]   cc:   [facility]

## 2011-04-24 NOTE — Discharge Summary (Signed)
NAMELERIN, JECH                   ACCOUNT NO.:  [id number]   MEDICAL RECORD NO.:  [id number]          PATIENT TYPE:  INP   LOCATION:  1433                         FACILITY:  [facility]   PHYSICIAN:  Hillery Aldo, M.D.   DATE OF BIRTH:  06-Jan-1929   DATE OF ADMISSION:  03/08/2008  DATE OF DISCHARGE:  03/17/2008                               DISCHARGE SUMMARY   ADDENDUM:   DATE OF DISCHARGE:  March 17, 2008, pending bed availability at a skilled  nursing facility   Addendum to previously dictated discharge summary.   ADDITIONAL DISCHARGE MEDICATIONS.:  Coumadin 4 mg daily, to begin March 18, 2008.  Thereafter, the Coumadin should be dosed and adjusted based on  a daily INR readings until a stable dose is found to maintain a goal INR  between 2-3.   REMAINDER OF HOSPITAL COURSE:  The patient remains medically stable and  awaiting skilled nursing facility placement.  For the full details of  her Hospital Course, please see the previously dictated Discharge  Summary done [facility].      Hillery Aldo, M.D.  Electronically Signed     CR/MEDQ  D:  03/17/2008  T:  03/17/2008  Job:  [id number]

## 2011-09-03 LAB — CBC
HCT: 26.8 — ABNORMAL LOW
HCT: 30.2 — ABNORMAL LOW
Hemoglobin: 10.7 — ABNORMAL LOW
Hemoglobin: 9.3 — ABNORMAL LOW
MCHC: 34.6
MCV: 103.8 — ABNORMAL HIGH
MCV: 104.4 — ABNORMAL HIGH
Platelets: 156
RBC: 2.89 — ABNORMAL LOW
RDW: 15.5
WBC: 8

## 2011-09-03 LAB — COMPREHENSIVE METABOLIC PANEL
AST: 111 — ABNORMAL HIGH
Albumin: 2.6 — ABNORMAL LOW
Alkaline Phosphatase: 125 — ABNORMAL HIGH
Alkaline Phosphatase: 148 — ABNORMAL HIGH
BUN: 58 — ABNORMAL HIGH
BUN: 59 — ABNORMAL HIGH
Calcium: 7.5 — ABNORMAL LOW
Creatinine, Ser: 5.69 — ABNORMAL HIGH
GFR calc Af Amer: 7 — ABNORMAL LOW
Glucose, Bld: 100 — ABNORMAL HIGH
Potassium: 4.1
Potassium: 4.4
Total Protein: 5 — ABNORMAL LOW
Total Protein: 6.1

## 2011-09-03 LAB — FOLATE: Folate: >20

## 2011-09-03 LAB — HEMOGLOBIN AND HEMATOCRIT, BLOOD: Hemoglobin: 9.3 — ABNORMAL LOW

## 2011-09-03 LAB — URINALYSIS, ROUTINE W REFLEX MICROSCOPIC
Leukocytes, UA: NEGATIVE
Nitrite: NEGATIVE
Protein, ur: 100 — AB
Urobilinogen, UA: 1
pH: 5

## 2011-09-03 LAB — DIFFERENTIAL
Basophils Relative: 1
Eosinophils Relative: 0
Lymphocytes Relative: 10 — ABNORMAL LOW
Monocytes Absolute: 0.5
Monocytes Relative: 7
Neutro Abs: 6.6

## 2011-09-03 LAB — RETICULOCYTES
RBC.: 2.81 — ABNORMAL LOW
Retic Count, Absolute: 36.5

## 2011-09-03 LAB — PROTIME-INR
INR: 1.3
Prothrombin Time: 16.1 — ABNORMAL HIGH

## 2011-09-03 LAB — CROSSMATCH: Antibody Screen: NEGATIVE

## 2011-09-03 LAB — MAGNESIUM: Magnesium: 1.6

## 2011-09-03 LAB — B-NATRIURETIC PEPTIDE (CONVERTED LAB): Pro B Natriuretic peptide (BNP): 209 — ABNORMAL HIGH

## 2011-09-03 LAB — URINE MICROSCOPIC-ADD ON

## 2011-09-03 LAB — ABO/RH: ABO/RH(D): A POS

## 2011-09-03 LAB — IRON AND TIBC: TIBC: 172 — ABNORMAL LOW

## 2011-09-04 LAB — COMPREHENSIVE METABOLIC PANEL
Albumin: 2.1 — ABNORMAL LOW
Alkaline Phosphatase: 109
Alkaline Phosphatase: 124 — ABNORMAL HIGH
BUN: 14
BUN: 50 — ABNORMAL HIGH
Calcium: 7.3 — ABNORMAL LOW
Chloride: 105
Creatinine, Ser: 1.15
GFR calc non Af Amer: 13 — ABNORMAL LOW
Glucose, Bld: 91
Potassium: 3.2 — ABNORMAL LOW
Potassium: 3.4 — ABNORMAL LOW
Total Bilirubin: 3.2 — ABNORMAL HIGH
Total Protein: 5 — ABNORMAL LOW
Total Protein: 5.2 — ABNORMAL LOW

## 2011-09-04 LAB — CBC
HCT: 24.4 — ABNORMAL LOW
HCT: 25.5 — ABNORMAL LOW
HCT: 27.1 — ABNORMAL LOW
HCT: 27.9 — ABNORMAL LOW
HCT: 27.9 — ABNORMAL LOW
HCT: 28.4 — ABNORMAL LOW
Hemoglobin: 9.1 — ABNORMAL LOW
Hemoglobin: 9.8 — ABNORMAL LOW
Hemoglobin: 9.9 — ABNORMAL LOW
Hemoglobin: 9.9 — ABNORMAL LOW
MCHC: 35.6
MCHC: 35.7
MCHC: 35.7
MCHC: 35.9
MCV: 103.9 — ABNORMAL HIGH
MCV: 104.6 — ABNORMAL HIGH
MCV: 104.6 — ABNORMAL HIGH
MCV: 105.3 — ABNORMAL HIGH
Platelets: 82 — ABNORMAL LOW
Platelets: 82 — ABNORMAL LOW
Platelets: 83 — ABNORMAL LOW
Platelets: 84 — ABNORMAL LOW
Platelets: 91 — ABNORMAL LOW
Platelets: 92 — ABNORMAL LOW
Platelets: 95 — ABNORMAL LOW
RBC: 2.44 — ABNORMAL LOW
RBC: 2.69 — ABNORMAL LOW
RDW: 15.1
RDW: 15.3
RDW: 15.4
RDW: 15.5
RDW: 15.7 — ABNORMAL HIGH
RDW: 15.8 — ABNORMAL HIGH
WBC: 4
WBC: 4.1
WBC: 4.2
WBC: 4.4
WBC: 7.9

## 2011-09-04 LAB — DIFFERENTIAL
Basophils Absolute: 0
Basophils Absolute: 0
Basophils Absolute: 0
Basophils Absolute: 0.1
Basophils Absolute: 0.1
Basophils Relative: 1
Basophils Relative: 1
Basophils Relative: 2 — ABNORMAL HIGH
Basophils Relative: 2 — ABNORMAL HIGH
Eosinophils Absolute: 0.1
Eosinophils Absolute: 0.2
Eosinophils Absolute: 0.2
Eosinophils Relative: 0
Eosinophils Relative: 0
Eosinophils Relative: 2
Eosinophils Relative: 4
Lymphocytes Relative: 12
Lymphocytes Relative: 22
Lymphocytes Relative: 22
Lymphocytes Relative: 26
Lymphocytes Relative: 26
Lymphocytes Relative: 9 — ABNORMAL LOW
Lymphs Abs: 0.5 — ABNORMAL LOW
Lymphs Abs: 0.7
Lymphs Abs: 1
Lymphs Abs: 1.1
Lymphs Abs: 1.2
Lymphs Abs: 1.2
Monocytes Absolute: 0.2
Monocytes Absolute: 0.4
Monocytes Absolute: 0.5
Monocytes Absolute: 0.6
Monocytes Absolute: 0.8
Monocytes Relative: 11
Monocytes Relative: 13 — ABNORMAL HIGH
Monocytes Relative: 16 — ABNORMAL HIGH
Neutro Abs: 2.4
Neutro Abs: 2.4
Neutro Abs: 2.5
Neutro Abs: 2.8
Neutro Abs: 3.1
Neutro Abs: 3.5
Neutro Abs: 6.8
Neutrophils Relative %: 55
Neutrophils Relative %: 56
Neutrophils Relative %: 59
Neutrophils Relative %: 62
Neutrophils Relative %: 65

## 2011-09-04 LAB — RENAL FUNCTION PANEL
Albumin: 2.1 — ABNORMAL LOW
BUN: 26 — ABNORMAL HIGH
CO2: 18 — ABNORMAL LOW
CO2: 20
Calcium: 7.1 — ABNORMAL LOW
Calcium: 7.2 — ABNORMAL LOW
Chloride: 112
Creatinine, Ser: 1.17
Creatinine, Ser: 1.83 — ABNORMAL HIGH
GFR calc Af Amer: 32 — ABNORMAL LOW
GFR calc non Af Amer: 45 — ABNORMAL LOW
Glucose, Bld: 117 — ABNORMAL HIGH
Glucose, Bld: 80
Phosphorus: 1.8 — ABNORMAL LOW
Phosphorus: 2.1 — ABNORMAL LOW
Potassium: 4
Sodium: 138
Sodium: 139

## 2011-09-04 LAB — BASIC METABOLIC PANEL WITH GFR
BUN: 16
BUN: 18
CO2: 28
CO2: 29
Calcium: 7.3 — ABNORMAL LOW
Calcium: 7.4 — ABNORMAL LOW
Chloride: 105
Chloride: 106
Creatinine, Ser: 1.03
Creatinine, Ser: 1.08
GFR calc non Af Amer: 49 — ABNORMAL LOW
GFR calc non Af Amer: 52 — ABNORMAL LOW
Glucose, Bld: 113 — ABNORMAL HIGH
Glucose, Bld: 91
Potassium: 3.5
Potassium: 3.5
Sodium: 139
Sodium: 142

## 2011-09-04 LAB — PROTIME-INR
INR: 1.3
INR: 1.3
INR: 1.6 — ABNORMAL HIGH
INR: 2.1 — ABNORMAL HIGH
INR: 3.6 — ABNORMAL HIGH
Prothrombin Time: 16.5 — ABNORMAL HIGH
Prothrombin Time: 16.6 — ABNORMAL HIGH
Prothrombin Time: 19.5 — ABNORMAL HIGH
Prothrombin Time: 37.5 — ABNORMAL HIGH
Prothrombin Time: 40.4 — ABNORMAL HIGH

## 2011-09-04 LAB — POTASSIUM: Potassium: 4.4

## 2011-09-04 LAB — HEPATIC FUNCTION PANEL
ALT: 38 — ABNORMAL HIGH
ALT: 40 — ABNORMAL HIGH
AST: 78 — ABNORMAL HIGH
AST: 90 — ABNORMAL HIGH
Albumin: 2.1 — ABNORMAL LOW
Alkaline Phosphatase: 112
Bilirubin, Direct: 1 — ABNORMAL HIGH
Indirect Bilirubin: 1.2 — ABNORMAL HIGH
Total Bilirubin: 2.2 — ABNORMAL HIGH
Total Protein: 5 — ABNORMAL LOW
Total Protein: 5 — ABNORMAL LOW

## 2011-09-04 LAB — HEPATITIS PANEL, ACUTE
HCV Ab: NEGATIVE
Hep A IgM: NEGATIVE
Hep B C IgM: NEGATIVE
Hepatitis B Surface Ag: NEGATIVE

## 2011-09-04 LAB — B-NATRIURETIC PEPTIDE (CONVERTED LAB): Pro B Natriuretic peptide (BNP): 261 — ABNORMAL HIGH

## 2011-09-04 LAB — HEMOGLOBIN AND HEMATOCRIT, BLOOD
HCT: 27.3 — ABNORMAL LOW
Hemoglobin: 9.7 — ABNORMAL LOW

## 2011-09-04 LAB — MAGNESIUM: Magnesium: 1 — ABNORMAL LOW

## 2011-09-04 LAB — URIC ACID: Uric Acid, Serum: 9.8 — ABNORMAL HIGH

## 2011-09-04 LAB — OCCULT BLOOD (STOOL CUP TO LAB): Fecal Occult Bld: NEGATIVE

## 2011-09-20 LAB — COMPREHENSIVE METABOLIC PANEL
ALT: 32
AST: 57 — ABNORMAL HIGH
Alkaline Phosphatase: 52
CO2: 26
Chloride: 89 — ABNORMAL LOW
GFR calc Af Amer: >60
GFR calc non Af Amer: 54 — ABNORMAL LOW
Glucose, Bld: 138 — ABNORMAL HIGH
Potassium: 2.7 — CL
Sodium: 128 — ABNORMAL LOW
Total Bilirubin: 1.3 — ABNORMAL HIGH

## 2011-09-20 LAB — URINE MICROSCOPIC-ADD ON

## 2011-09-20 LAB — DIFFERENTIAL
Basophils Absolute: 0
Basophils Relative: 0
Eosinophils Absolute: 0
Eosinophils Relative: 0
Lymphs Abs: 0.6 — ABNORMAL LOW
Neutrophils Relative %: 79 — ABNORMAL HIGH

## 2011-09-20 LAB — URINALYSIS, ROUTINE W REFLEX MICROSCOPIC
Glucose, UA: NEGATIVE
Hgb urine dipstick: NEGATIVE
Ketones, ur: NEGATIVE
pH: 7

## 2011-09-20 LAB — URINE CULTURE: Colony Count: 5000

## 2011-09-20 LAB — POCT CARDIAC MARKERS: Troponin i, poc: <0.05

## 2011-09-20 LAB — PROTIME-INR
INR: 1
Prothrombin Time: 13

## 2011-09-20 LAB — CBC
Hemoglobin: 12.9
MCHC: 34.1
RBC: 3.87
WBC: 5.3

## 2012-02-04 DIAGNOSIS — E559 Vitamin D deficiency, unspecified: Secondary | ICD-10-CM | POA: Diagnosis not present

## 2012-02-04 DIAGNOSIS — E039 Hypothyroidism, unspecified: Secondary | ICD-10-CM | POA: Diagnosis not present

## 2012-02-04 DIAGNOSIS — I10 Essential (primary) hypertension: Secondary | ICD-10-CM | POA: Diagnosis not present

## 2012-02-04 DIAGNOSIS — E785 Hyperlipidemia, unspecified: Secondary | ICD-10-CM | POA: Diagnosis not present

## 2012-03-03 DIAGNOSIS — H60399 Other infective otitis externa, unspecified ear: Secondary | ICD-10-CM | POA: Diagnosis not present

## 2012-03-03 DIAGNOSIS — I10 Essential (primary) hypertension: Secondary | ICD-10-CM | POA: Diagnosis not present

## 2012-03-31 DIAGNOSIS — R059 Cough, unspecified: Secondary | ICD-10-CM | POA: Diagnosis not present

## 2012-03-31 DIAGNOSIS — I10 Essential (primary) hypertension: Secondary | ICD-10-CM | POA: Diagnosis not present

## 2012-05-30 DIAGNOSIS — E559 Vitamin D deficiency, unspecified: Secondary | ICD-10-CM | POA: Diagnosis not present

## 2012-05-30 DIAGNOSIS — I10 Essential (primary) hypertension: Secondary | ICD-10-CM | POA: Diagnosis not present

## 2012-05-30 DIAGNOSIS — E119 Type 2 diabetes mellitus without complications: Secondary | ICD-10-CM | POA: Diagnosis not present

## 2012-05-30 DIAGNOSIS — E039 Hypothyroidism, unspecified: Secondary | ICD-10-CM | POA: Diagnosis not present

## 2012-05-30 DIAGNOSIS — E785 Hyperlipidemia, unspecified: Secondary | ICD-10-CM | POA: Diagnosis not present

## 2012-07-08 DIAGNOSIS — E1149 Type 2 diabetes mellitus with other diabetic neurological complication: Secondary | ICD-10-CM | POA: Diagnosis not present

## 2012-07-08 DIAGNOSIS — L851 Acquired keratosis [keratoderma] palmaris et plantaris: Secondary | ICD-10-CM | POA: Diagnosis not present

## 2012-08-01 DIAGNOSIS — E559 Vitamin D deficiency, unspecified: Secondary | ICD-10-CM | POA: Diagnosis not present

## 2012-08-01 DIAGNOSIS — I10 Essential (primary) hypertension: Secondary | ICD-10-CM | POA: Diagnosis not present

## 2012-08-01 DIAGNOSIS — E039 Hypothyroidism, unspecified: Secondary | ICD-10-CM | POA: Diagnosis not present

## 2012-08-01 DIAGNOSIS — E785 Hyperlipidemia, unspecified: Secondary | ICD-10-CM | POA: Diagnosis not present

## 2012-09-04 DIAGNOSIS — Z23 Encounter for immunization: Secondary | ICD-10-CM | POA: Diagnosis not present

## 2012-09-30 DIAGNOSIS — E559 Vitamin D deficiency, unspecified: Secondary | ICD-10-CM | POA: Diagnosis not present

## 2012-09-30 DIAGNOSIS — E039 Hypothyroidism, unspecified: Secondary | ICD-10-CM | POA: Diagnosis not present

## 2012-09-30 DIAGNOSIS — E119 Type 2 diabetes mellitus without complications: Secondary | ICD-10-CM | POA: Diagnosis not present

## 2012-09-30 DIAGNOSIS — R635 Abnormal weight gain: Secondary | ICD-10-CM | POA: Diagnosis not present

## 2012-09-30 DIAGNOSIS — E785 Hyperlipidemia, unspecified: Secondary | ICD-10-CM | POA: Diagnosis not present

## 2012-10-10 DIAGNOSIS — IMO0001 Reserved for inherently not codable concepts without codable children: Secondary | ICD-10-CM | POA: Diagnosis not present

## 2012-10-10 DIAGNOSIS — E785 Hyperlipidemia, unspecified: Secondary | ICD-10-CM | POA: Diagnosis not present

## 2012-10-28 DIAGNOSIS — L851 Acquired keratosis [keratoderma] palmaris et plantaris: Secondary | ICD-10-CM | POA: Diagnosis not present

## 2012-10-28 DIAGNOSIS — E1149 Type 2 diabetes mellitus with other diabetic neurological complication: Secondary | ICD-10-CM | POA: Diagnosis not present

## 2013-01-02 DIAGNOSIS — E785 Hyperlipidemia, unspecified: Secondary | ICD-10-CM | POA: Diagnosis not present

## 2013-01-02 DIAGNOSIS — E119 Type 2 diabetes mellitus without complications: Secondary | ICD-10-CM | POA: Diagnosis not present

## 2013-01-02 DIAGNOSIS — I10 Essential (primary) hypertension: Secondary | ICD-10-CM | POA: Diagnosis not present

## 2013-01-16 DIAGNOSIS — I10 Essential (primary) hypertension: Secondary | ICD-10-CM | POA: Diagnosis not present

## 2013-01-16 DIAGNOSIS — E785 Hyperlipidemia, unspecified: Secondary | ICD-10-CM | POA: Diagnosis not present

## 2013-03-23 ENCOUNTER — Other Ambulatory Visit: Payer: Self-pay | Admitting: *Deleted

## 2013-03-23 MED ORDER — LEVOTHYROXINE SODIUM 100 MCG PO TABS: 100.0000 ug | ORAL_TABLET | Freq: Every day | ORAL | Status: DC

## 2013-03-23 MED ORDER — CITALOPRAM HYDROBROMIDE 20 MG PO TABS: 20.0000 mg | ORAL_TABLET | Freq: Every day | ORAL | Status: DC

## 2013-04-16 ENCOUNTER — Encounter: Payer: Self-pay | Admitting: Family Medicine

## 2013-04-16 ENCOUNTER — Ambulatory Visit (INDEPENDENT_AMBULATORY_CARE_PROVIDER_SITE_OTHER): Payer: Medicare Other | Admitting: Family Medicine

## 2013-04-16 VITALS — BP 174/82 | HR 87 | Temp 98.7°F | Wt 215.0 lb

## 2013-04-16 DIAGNOSIS — E559 Vitamin D deficiency, unspecified: Secondary | ICD-10-CM | POA: Diagnosis not present

## 2013-04-16 DIAGNOSIS — E669 Obesity, unspecified: Secondary | ICD-10-CM | POA: Insufficient documentation

## 2013-04-16 DIAGNOSIS — I10 Essential (primary) hypertension: Secondary | ICD-10-CM | POA: Insufficient documentation

## 2013-04-16 DIAGNOSIS — E119 Type 2 diabetes mellitus without complications: Secondary | ICD-10-CM

## 2013-04-16 DIAGNOSIS — F329 Major depressive disorder, single episode, unspecified: Secondary | ICD-10-CM

## 2013-04-16 DIAGNOSIS — E785 Hyperlipidemia, unspecified: Secondary | ICD-10-CM | POA: Diagnosis not present

## 2013-04-16 DIAGNOSIS — F3289 Other specified depressive episodes: Secondary | ICD-10-CM

## 2013-04-16 DIAGNOSIS — E039 Hypothyroidism, unspecified: Secondary | ICD-10-CM | POA: Insufficient documentation

## 2013-04-16 DIAGNOSIS — F32A Depression, unspecified: Secondary | ICD-10-CM

## 2013-04-16 DIAGNOSIS — M21069 Valgus deformity, not elsewhere classified, unspecified knee: Secondary | ICD-10-CM | POA: Insufficient documentation

## 2013-04-16 LAB — TSH: TSH: 6.036 u[IU]/mL — ABNORMAL HIGH (ref 0.350–4.500)

## 2013-04-16 MED ORDER — DILTIAZEM HCL ER 240 MG PO CP24: 240.0000 mg | ORAL_CAPSULE | Freq: Two times a day (BID) | ORAL | Status: DC

## 2013-04-16 MED ORDER — METFORMIN HCL 500 MG PO TABS: 500.0000 mg | ORAL_TABLET | Freq: Every day | ORAL | Status: DC

## 2013-04-16 MED ORDER — LEVOTHYROXINE SODIUM 100 MCG PO TABS: 100.0000 ug | ORAL_TABLET | Freq: Every day | ORAL | Status: DC

## 2013-04-16 MED ORDER — LOSARTAN POTASSIUM 50 MG PO TABS: 50.0000 mg | ORAL_TABLET | Freq: Every day | ORAL | Status: DC

## 2013-04-16 MED ORDER — CITALOPRAM HYDROBROMIDE 20 MG PO TABS: 20.0000 mg | ORAL_TABLET | Freq: Every day | ORAL | Status: DC

## 2013-04-16 MED ORDER — CHLORTHALIDONE 25 MG PO TABS: 12.5000 mg | ORAL_TABLET | Freq: Every day | ORAL | Status: DC

## 2013-04-16 NOTE — Progress Notes (Signed)
Patient ID: Katherine Tyler, female   DOB: 10-May-1929, 79 y.o.   MRN: [id number] SUBJECTIVE: HPI: Patient is here for follow up of hypertension/hyperlipidemia/dm: denies Headache;deniesChest Pain;denies weakness;denies Shortness of Breath or Orthopnea;denies Visual changes;denies palpitations;denies cough;denies pedal edema;denies symptoms of TIA or stroke; admits to Compliance with medications. denies Problems with medications. Hasn't checked her sugars consistently.   PMH/PSH: reviewed/updated in Epic  SH/FH: reviewed/updated in Epic  Allergies: reviewed/updated in Epic  Medications: reviewed/updated in Epic  Immunizations: reviewed/updated in Epic  ROS: As above in the HPI. All other systems are stable or negative.  OBJECTIVE: APPEARANCE:  Patient in no acute distress.The patient appeared well nourished and normally developed. Acyanotic. Waist: VITAL SIGNS:BP 174/82  Pulse 87  Temp(Src) 98.7 F (37.1 C) (Oral)  Wt 215 lb (97.523 kg) Obese WF  SKIN: warm and  Dry without overt rashes, tattoos and scars  HEAD and Neck: without JVD, Head and scalp: normal Eyes:No scleral icterus. Fundi normal, eye movements normal. Ears: Auricle normal, canal normal, Tympanic membranes normal, insufflation normal. Nose: normal Throat: normal Neck & thyroid: normal  CHEST & LUNGS: Chest wall: normal Lungs: Clear  CVS: Reveals the PMI to be normally located. Regular rhythm, First and Second Heart sounds are normal,  absence of murmurs, rubs or gallops. Peripheral vasculature: Radial pulses: normal Dorsal pedis pulses: normal Posterior pulses: normal  ABDOMEN:  Appearance:obese Benign,, no organomegaly, no masses, no Abdominal Aortic enlargement. No Guarding , no rebound. No Bruits. Bowel sounds: normal  RECTAL: N/A GU: N/A  EXTREMETIES: nonedematous. Both Femoral and Pedal pulses are normal.  MUSCULOSKELETAL:  Spine: normal Joints: intact  NEUROLOGIC: oriented to  time,place and person; nonfocal. Strength is normal Sensory is normal Reflexes are normal Cranial Nerves are normal. Monofilament foot check intact.  ASSESSMENT: HTN (hypertension) - Plan: BASIC METABOLIC PANEL WITH GFR, diltiazem (DILACOR XR) 240 MG 24 hr capsule, chlorthalidone (HYGROTON) 25 MG tablet, losartan (COZAAR) 50 MG tablet  HLD (hyperlipidemia) - Plan: Hepatic function panel, NMR Lipoprofile with Lipids  Obesity, unspecified  Unspecified vitamin D deficiency - Plan: Vitamin D 25 hydroxy  Unspecified hypothyroidism - Plan: levothyroxine (SYNTHROID, LEVOTHROID) 100 MCG tablet  DM (diabetes mellitus) - Plan: POCT glycosylated hemoglobin (Hb A1C), POCT UA - Microalbumin, TSH, metFORMIN (GLUCOPHAGE) 500 MG tablet  Genu valgum (acquired)  Depression - Plan: citalopram (CELEXA) 20 MG tablet    PLAN: Orders Placed This Encounter  Procedures  . BASIC METABOLIC PANEL WITH GFR  . Hepatic function panel  . NMR Lipoprofile with Lipids  . TSH  . Vitamin D 25 hydroxy  . POCT glycosylated hemoglobin (Hb A1C)  . POCT UA - Microalbumin   Meds ordered this encounter  Medications  . DISCONTD: chlorthalidone (HYGROTON) 25 MG tablet    Sig: Take 12.5 mg by mouth daily.  Marland Kitchen DISCONTD: diltiazem (DILACOR XR) 240 MG 24 hr capsule    Sig: Take 240 mg by mouth 2 (two) times daily.   . fluticasone (FLONASE) 50 MCG/ACT nasal spray    Sig: Place 1 spray into the nose daily.   Marland Kitchen DISCONTD: metFORMIN (GLUCOPHAGE) 500 MG tablet    Sig: Take 500 mg by mouth daily with breakfast.   . aspirin 81 MG tablet    Sig: Take 81 mg by mouth daily.  . Ferrous Sulfate Dried (SLOW RELEASE IRON) 45 MG TBCR    Sig: Take 45 tablets by mouth daily.  . Cholecalciferol (D3-1000) 1000 UNITS capsule    Sig: Take 1,000 Units by mouth daily.  Marland Kitchen  magnesium oxide (MAG-OX) 400 MG tablet    Sig: Take 400 mg by mouth daily.  Marland Kitchen levothyroxine (SYNTHROID, LEVOTHROID) 100 MCG tablet    Sig: Take 1 tablet (100 mcg  total) by mouth daily.    Dispense:  30 tablet    Refill:  5  . citalopram (CELEXA) 20 MG tablet    Sig: Take 1 tablet (20 mg total) by mouth daily.    Dispense:  30 tablet    Refill:  5  . metFORMIN (GLUCOPHAGE) 500 MG tablet    Sig: Take 1 tablet (500 mg total) by mouth daily with breakfast.    Dispense:  30 tablet    Refill:  5  . diltiazem (DILACOR XR) 240 MG 24 hr capsule    Sig: Take 1 capsule (240 mg total) by mouth 2 (two) times daily.    Dispense:  60 capsule    Refill:  5  . chlorthalidone (HYGROTON) 25 MG tablet    Sig: Take 0.5 tablets (12.5 mg total) by mouth daily.    Dispense:  30 tablet    Refill:  5  . losartan (COZAAR) 50 MG tablet    Sig: Take 1 tablet (50 mg total) by mouth daily.    Dispense:  30 tablet    Refill:  5   Patient was supposed to be on an ACE/ARB. And somehow she is not on any.  Poor compliance with meds.  Diet and exercise, keeping active  Discussed. Patient to bring meds to every visit.  Meds adjusted.  RTC 2 weeks to check BP.  Caster Fayette P. Modesto Charon, M.D.

## 2013-04-17 LAB — HEPATIC FUNCTION PANEL
ALT: 12 U/L (ref 0–35)
AST: 20 U/L (ref 0–37)
Albumin: 4.2 g/dL (ref 3.5–5.2)
Alkaline Phosphatase: 70 U/L (ref 39–117)
Bilirubin, Direct: 0.1 mg/dL (ref 0.0–0.3)
Indirect Bilirubin: 0.3 mg/dL (ref 0.0–0.9)
Total Bilirubin: 0.4 mg/dL (ref 0.3–1.2)
Total Protein: 7.3 g/dL (ref 6.0–8.3)

## 2013-04-17 LAB — BASIC METABOLIC PANEL WITH GFR
BUN: 23 mg/dL (ref 6–23)
CO2: 28 mEq/L (ref 19–32)
Calcium: 9.7 mg/dL (ref 8.4–10.5)
Chloride: 95 mEq/L — ABNORMAL LOW (ref 96–112)
Creat: 1.11 mg/dL — ABNORMAL HIGH (ref 0.50–1.10)
GFR, Est African American: 54 mL/min — ABNORMAL LOW
GFR, Est Non African American: 47 mL/min — ABNORMAL LOW
Glucose, Bld: 88 mg/dL (ref 70–99)
Potassium: 3.7 mEq/L (ref 3.5–5.3)
Sodium: 132 mEq/L — ABNORMAL LOW (ref 135–145)

## 2013-04-17 LAB — VITAMIN D 25 HYDROXY (VIT D DEFICIENCY, FRACTURES): Vit D, 25-Hydroxy: 31 ng/mL (ref 30–89)

## 2013-04-20 LAB — NMR LIPOPROFILE WITH LIPIDS
Cholesterol, Total: 168 mg/dL (ref <200)
HDL Particle Number: 30.4 umol/L — ABNORMAL LOW (ref >=30.5)
HDL Size: 9.9 nm (ref >=9.2)
HDL-C: 58 mg/dL (ref >=40)
LDL (calc): 93 mg/dL (ref <100)
LDL Particle Number: 1291 nmol/L — ABNORMAL HIGH (ref <1000)
LDL Size: 21 nm (ref >20.5)
LP-IR Score: 37 (ref <=45)
Large HDL-P: 11.9 umol/L (ref >=4.8)
Large VLDL-P: 2.5 nmol/L (ref <=2.7)
Small LDL Particle Number: 478 nmol/L (ref <=527)
Triglycerides: 85 mg/dL (ref <150)
VLDL Size: 53.5 nm — ABNORMAL HIGH (ref <=46.6)

## 2013-04-21 ENCOUNTER — Other Ambulatory Visit: Payer: Self-pay | Admitting: Family Medicine

## 2013-04-21 DIAGNOSIS — E039 Hypothyroidism, unspecified: Secondary | ICD-10-CM

## 2013-04-21 MED ORDER — LEVOTHYROXINE SODIUM 112 MCG PO TABS: 112.0000 ug | ORAL_TABLET | Freq: Every day | ORAL | Status: DC

## 2013-04-30 ENCOUNTER — Encounter: Payer: Self-pay | Admitting: Family Medicine

## 2013-04-30 ENCOUNTER — Ambulatory Visit (INDEPENDENT_AMBULATORY_CARE_PROVIDER_SITE_OTHER): Payer: Medicare Other | Admitting: Family Medicine

## 2013-04-30 ENCOUNTER — Telehealth: Payer: Self-pay | Admitting: Family Medicine

## 2013-04-30 VITALS — BP 145/68 | HR 75 | Temp 97.4°F | Ht 64.0 in | Wt 212.0 lb

## 2013-04-30 DIAGNOSIS — E785 Hyperlipidemia, unspecified: Secondary | ICD-10-CM | POA: Diagnosis not present

## 2013-04-30 DIAGNOSIS — J309 Allergic rhinitis, unspecified: Secondary | ICD-10-CM

## 2013-04-30 DIAGNOSIS — E039 Hypothyroidism, unspecified: Secondary | ICD-10-CM

## 2013-04-30 DIAGNOSIS — I10 Essential (primary) hypertension: Secondary | ICD-10-CM

## 2013-04-30 DIAGNOSIS — J302 Other seasonal allergic rhinitis: Secondary | ICD-10-CM | POA: Insufficient documentation

## 2013-04-30 MED ORDER — FLUTICASONE PROPIONATE 50 MCG/ACT NA SUSP: 2.0000 | Freq: Every day | NASAL | Status: DC

## 2013-04-30 NOTE — Telephone Encounter (Signed)
Daughter called concerned that mother did not tell her that she had an appt today and daughter manages meds. Mom had forgot to let her know and caught a ride to appt. Advised daughter no change in meds. Will call in a few days with lab results

## 2013-04-30 NOTE — Progress Notes (Signed)
Patient ID: Katherine Tyler, female   DOB: 1929-11-07, 79 y.o.   MRN: [id number] SUBJECTIVE: HPI: Patient is here for follow up of hypertension: ARB was added BP is better.  denies Headache;deniesChest Pain;denies weakness;denies Shortness of Breath or Orthopnea;denies Visual changes;denies palpitations;denies cough;denies pedal edema;denies symptoms of TIA or stroke; admits to Compliance with medications. denies Problems with medications. Having allergy flare up. Congested nose.  PMH/PSH: reviewed/updated in Epic  SH/FH: reviewed/updated in Epic  Allergies: reviewed/updated in Epic  Medications: reviewed/updated in Epic  Immunizations: reviewed/updated in Epic  ROS: As above in the HPI. All other systems are stable or negative.  OBJECTIVE: APPEARANCE:  Patient in no acute distress.The patient appeared well nourished and normally developed. Acyanotic. Waist: VITAL SIGNS:  SKIN: warm and  Dry without overt rashes, tattoos and scars  HEAD and Neck: without JVD, Head and scalp: normal Eyes:No scleral icterus. Fundi normal, eye movements normal. Ears: Auricle normal, canal normal, Tympanic membranes normal, insufflation normal. Nose:Rhinitis rhinorrhea. Throat: normal Neck & thyroid: normal  CHEST & LUNGS: Chest wall: normal Lungs: Clear  CVS: Reveals the PMI to be normally located. Regular rhythm, First and Second Heart sounds are normal,  absence of murmurs, rubs or gallops. Peripheral vasculature: Radial pulses: normal Dorsal pedis pulses: normal Posterior pulses: normal  ABDOMEN:  Appearance: normal Benign,, no organomegaly, no masses, no Abdominal Aortic enlargement. No Guarding , no rebound. No Bruits. Bowel sounds: normal  RECTAL: N/A GU: N/A  EXTREMETIES: nonedematous.  NEUROLOGIC: oriented to time,place and person; nonfocal.  ASSESSMENT: HTN (hypertension)  HLD (hyperlipidemia)  Unspecified hypothyroidism  Seasonal allergic rhinitis - Plan:  fluticasone (FLONASE) 50 MCG/ACT nasal spray BP much better now.  PLAN: No orders of the defined types were placed in this encounter.   Results for orders placed in visit on 04/16/13  BASIC METABOLIC PANEL WITH GFR      Result Value Range   Sodium 132 (*) 135 - 145 mEq/L   Potassium 3.7  3.5 - 5.3 mEq/L   Chloride 95 (*) 96 - 112 mEq/L   CO2 28  19 - 32 mEq/L   Glucose, Bld 88  70 - 99 mg/dL   BUN 23  6 - 23 mg/dL   Creat 2.13 (*) 0.86 - 1.10 mg/dL   Calcium 9.7  8.4 - 57.8 mg/dL   GFR, Est African American 54 (*)    GFR, Est Non African American 47 (*)   HEPATIC FUNCTION PANEL      Result Value Range   Total Bilirubin 0.4  0.3 - 1.2 mg/dL   Bilirubin, Direct 0.1  0.0 - 0.3 mg/dL   Indirect Bilirubin 0.3  0.0 - 0.9 mg/dL   Alkaline Phosphatase 70  39 - 117 U/L   AST 20  0 - 37 U/L   ALT 12  0 - 35 U/L   Total Protein 7.3  6.0 - 8.3 g/dL   Albumin 4.2  3.5 - 5.2 g/dL  NMR LIPOPROFILE WITH LIPIDS      Result Value Range   LDL Particle Number [id number] (*) <1000 nmol/L   LDL (calc) 93  <100 mg/dL   HDL-C 58  >=46 mg/dL   Triglycerides 85  <962 mg/dL   Cholesterol, Total 952  <200 mg/dL   HDL Particle Number 84.1 (*) >=30.5 umol/L   Large HDL-P 11.9  >=4.8 umol/L   Large VLDL-P 2.5  <=2.7 nmol/L   Small LDL Particle Number 478  <=527 nmol/L   LDL Size 21.0  >20.5 nm  HDL Size 9.9  >=9.2 nm   VLDL Size 53.5 (*) <=46.6 nm   LP-IR Score 37  <=45  TSH      Result Value Range   TSH 6.036 (*) 0.350 - 4.500 uIU/mL  VITAMIN D 25 HYDROXY      Result Value Range   Vit D, 25-Hydroxy 31  30 - 89 ng/mL   Meds ordered this encounter  Medications  . fluticasone (FLONASE) 50 MCG/ACT nasal spray    Sig: Place 2 sprays into the nose daily.    Dispense:  16 g    Refill:  3  levothyroxine  Was increased recently RTC 6 weeks. For follow up of BP and TSH  Ab Leaming P. Modesto Charon, M.D.

## 2013-05-01 ENCOUNTER — Other Ambulatory Visit: Payer: Self-pay | Admitting: Family Medicine

## 2013-06-05 ENCOUNTER — Other Ambulatory Visit: Payer: Self-pay | Admitting: Nurse Practitioner

## 2013-06-15 ENCOUNTER — Ambulatory Visit: Payer: Medicare Other

## 2013-06-24 ENCOUNTER — Ambulatory Visit (INDEPENDENT_AMBULATORY_CARE_PROVIDER_SITE_OTHER): Payer: Medicare Other | Admitting: Family Medicine

## 2013-06-24 ENCOUNTER — Encounter: Payer: Self-pay | Admitting: Family Medicine

## 2013-06-24 VITALS — BP 157/67 | HR 77 | Temp 97.3°F | Ht 64.0 in | Wt 209.0 lb

## 2013-06-24 DIAGNOSIS — E039 Hypothyroidism, unspecified: Secondary | ICD-10-CM | POA: Diagnosis not present

## 2013-06-24 DIAGNOSIS — E669 Obesity, unspecified: Secondary | ICD-10-CM

## 2013-06-24 DIAGNOSIS — E559 Vitamin D deficiency, unspecified: Secondary | ICD-10-CM

## 2013-06-24 DIAGNOSIS — E119 Type 2 diabetes mellitus without complications: Secondary | ICD-10-CM

## 2013-06-24 DIAGNOSIS — M21069 Valgus deformity, not elsewhere classified, unspecified knee: Secondary | ICD-10-CM

## 2013-06-24 DIAGNOSIS — I8390 Asymptomatic varicose veins of unspecified lower extremity: Secondary | ICD-10-CM | POA: Insufficient documentation

## 2013-06-24 DIAGNOSIS — I10 Essential (primary) hypertension: Secondary | ICD-10-CM | POA: Diagnosis not present

## 2013-06-24 DIAGNOSIS — E785 Hyperlipidemia, unspecified: Secondary | ICD-10-CM

## 2013-06-24 LAB — POCT GLYCOSYLATED HEMOGLOBIN (HGB A1C): Hemoglobin A1C: 5.1

## 2013-06-24 NOTE — Progress Notes (Signed)
Patient ID: Katherine Tyler, female   DOB: 1929-01-10, 79 y.o.   MRN: [id number] SUBJECTIVE: CC: Chief Complaint  Patient presents with  . Follow-up    6 week  . knot on leg    HPI: Here to follow up on BP and do TSH. However, a knew soft nontender knot came out above the medial malleolus of the right ankle. No chest pain , no shortness of breath. Here to recheck the BP. She has brought her BPs in and they are all fine. But when she gets here she gets nervous and it goes up. denies Headache;deniesChest Pain;denies weakness;denies Shortness of Breath or Orthopnea;denies Visual changes;denies palpitations;denies cough;denies pedal edema;denies symptoms of TIA or stroke; admits to Compliance with medications. denies Problems with medications.  Past Medical History  Diagnosis Date  . Hyperlipidemia   . Hypertension   . Arthritis   . Hiatal hernia   . Anxiety   . GERD (gastroesophageal reflux disease)   . Vitamin D deficiency   . Thyroid disease   . Diabetes mellitus without complication   . Obesity    History reviewed. No pertinent past surgical history. History   Social History  . Marital Status: Widowed    Spouse Name: N/A    Number of Children: N/A  . Years of Education: N/A   Occupational History  . Not on file.   Social History Main Topics  . Smoking status: Never Smoker   . Smokeless tobacco: Not on file  . Alcohol Use: Not on file  . Drug Use: Not on file  . Sexually Active: Not on file   Other Topics Concern  . Not on file   Social History Narrative  . No narrative on file   History reviewed. No pertinent family history. Current Outpatient Prescriptions on File Prior to Visit  Medication Sig Dispense Refill  . aspirin 81 MG tablet Take 81 mg by mouth daily.      . chlorthalidone (HYGROTON) 25 MG tablet Take 0.5 tablets (12.5 mg total) by mouth daily.  30 tablet  5  . Cholecalciferol (D3-1000) 1000 UNITS capsule Take 1,000 Units by mouth daily.      .  citalopram (CELEXA) 20 MG tablet Take 1 tablet (20 mg total) by mouth daily.  30 tablet  5  . diltiazem (DILACOR XR) 240 MG 24 hr capsule Take 1 capsule (240 mg total) by mouth 2 (two) times daily.  60 capsule  5  . Ferrous Sulfate Dried (SLOW RELEASE IRON) 45 MG TBCR Take 45 tablets by mouth daily.      . fluticasone (FLONASE) 50 MCG/ACT nasal spray Place 2 sprays into the nose daily.  16 g  3  . levothyroxine (SYNTHROID, LEVOTHROID) 112 MCG tablet Take 1 tablet (112 mcg total) by mouth daily.  30 tablet  5  . losartan (COZAAR) 50 MG tablet Take 1 tablet (50 mg total) by mouth daily.  30 tablet  5  . magnesium oxide (MAG-OX) 400 MG tablet Take 400 mg by mouth daily.      . metFORMIN (GLUCOPHAGE) 500 MG tablet Take 1 tablet (500 mg total) by mouth daily with breakfast.  30 tablet  5   No current facility-administered medications on file prior to visit.   Allergies  Allergen Reactions  . Keflex (Cephalexin)    Immunization History  Administered Date(s) Administered  . Pneumococcal Polysaccharide 12/10/1996   Prior to Admission medications   Medication Sig Start Date End Date Taking? Authorizing Provider  aspirin 81  MG tablet Take 81 mg by mouth daily.   Yes Historical Provider, MD  chlorthalidone (HYGROTON) 25 MG tablet Take 0.5 tablets (12.5 mg total) by mouth daily. 04/16/13  Yes Ileana Ladd, MD  Cholecalciferol (D3-1000) 1000 UNITS capsule Take 1,000 Units by mouth daily.   Yes Historical Provider, MD  citalopram (CELEXA) 20 MG tablet Take 1 tablet (20 mg total) by mouth daily. 04/16/13  Yes Ileana Ladd, MD  diltiazem (DILACOR XR) 240 MG 24 hr capsule Take 1 capsule (240 mg total) by mouth 2 (two) times daily. 04/16/13  Yes Ileana Ladd, MD  Ferrous Sulfate Dried (SLOW RELEASE IRON) 45 MG TBCR Take 45 tablets by mouth daily.   Yes Historical Provider, MD  fluticasone (FLONASE) 50 MCG/ACT nasal spray Place 2 sprays into the nose daily. 04/30/13  Yes Ileana Ladd, MD  levothyroxine  (SYNTHROID, LEVOTHROID) 112 MCG tablet Take 1 tablet (112 mcg total) by mouth daily. 04/21/13  Yes Ileana Ladd, MD  losartan (COZAAR) 50 MG tablet Take 1 tablet (50 mg total) by mouth daily. 04/16/13  Yes Ileana Ladd, MD  magnesium oxide (MAG-OX) 400 MG tablet Take 400 mg by mouth daily.   Yes Historical Provider, MD  metFORMIN (GLUCOPHAGE) 500 MG tablet Take 1 tablet (500 mg total) by mouth daily with breakfast. 04/16/13  Yes Ileana Ladd, MD    ROS: As above in the HPI. All other systems are stable or negative.  OBJECTIVE: APPEARANCE:  Patient in no acute distress.The patient appeared well nourished and normally developed. Acyanotic. Waist: VITAL SIGNS:BP 157/67  Pulse 77  Temp(Src) 97.3 F (36.3 C) (Oral)  Ht 5\' 4"  (1.626 m)  Wt 209 lb (94.802 kg)  BMI 35.86 kg/m2 Obese WF Recheck BP 140/75  SKIN: warm and  Dry without overt rashes, tattoos and scars  HEAD and Neck: without JVD, Head and scalp: normal Eyes:No scleral icterus. Fundi normal, eye movements normal. Ears: Auricle normal, canal normal, Tympanic membranes normal, insufflation normal. Nose: normal Throat: normal Neck & thyroid: normal  CHEST & LUNGS: Chest wall: normal Lungs: Clear  CVS: Reveals the PMI to be normally located. Regular rhythm, First and Second Heart sounds are normal,  absence of murmurs, rubs or gallops. Peripheral vasculature: Radial pulses: normal Dorsal pedis pulses: normal Posterior pulses: normal Marked varicose veins with varix protuberances where the perforators are, are ballooned out.  ABDOMEN:  Appearance: obese Benign, no organomegaly, no masses, no Abdominal Aortic enlargement. No Guarding , no rebound. No Bruits. Bowel sounds: normal  RECTAL: N/A GU: N/A  EXTREMETIES: nonedematous. Both Femoral and Pedal pulses are normal.  MUSCULOSKELETAL:  Spine: normal Joints: hips and knees arthritic and ambulates with a cane.  NEUROLOGIC: oriented to time,place and  person; nonfocal.  ASSESSMENT: Varicose veins  Unspecified hypothyroidism - Plan: TSH  HTN (hypertension)  DM (diabetes mellitus) - Plan: POCT glycosylated hemoglobin (Hb A1C)  Genu valgum (acquired)  HLD (hyperlipidemia)  Obesity, unspecified  Unspecified vitamin D deficiency  bp is better. Acceptable for age.  PLAN:  Rx for knee high compression stockings 20-30 mmhg. #1 pair RF prnx3. Elevate  Legs when sitting. Discussed the varicosities. Patient defers any CVTS intervention at this time.  Orders Placed This Encounter  Procedures  . TSH  . POCT glycosylated hemoglobin (Hb A1C)   Reassurance about the varices. There are no thrombosed veins.  Return in about 2 months (around 08/25/2013).  Chi Woodham P. Modesto Charon, M.D.

## 2013-06-25 LAB — TSH: TSH: 1.816 u[IU]/mL (ref 0.350–4.500)

## 2013-06-27 NOTE — Progress Notes (Signed)
Quick Note:  Lab result at goal. Thyroid is at goal. however the hgba1C is too low She can stop the metformin Keep follow up. ______

## 2013-07-05 ENCOUNTER — Other Ambulatory Visit: Payer: Self-pay | Admitting: Family Medicine

## 2013-07-30 ENCOUNTER — Encounter: Payer: Self-pay | Admitting: *Deleted

## 2013-08-20 ENCOUNTER — Other Ambulatory Visit: Payer: Self-pay | Admitting: Family Medicine

## 2013-08-25 ENCOUNTER — Ambulatory Visit: Payer: Medicare Other | Admitting: Family Medicine

## 2013-08-26 ENCOUNTER — Ambulatory Visit: Payer: Medicare Other | Admitting: Family Medicine

## 2013-08-31 ENCOUNTER — Encounter: Payer: Self-pay | Admitting: Family Medicine

## 2013-08-31 ENCOUNTER — Ambulatory Visit (INDEPENDENT_AMBULATORY_CARE_PROVIDER_SITE_OTHER): Payer: Medicare Other | Admitting: Family Medicine

## 2013-08-31 VITALS — BP 147/72 | HR 80 | Temp 98.5°F | Ht 64.0 in | Wt 213.2 lb

## 2013-08-31 DIAGNOSIS — E119 Type 2 diabetes mellitus without complications: Secondary | ICD-10-CM

## 2013-08-31 DIAGNOSIS — M21069 Valgus deformity, not elsewhere classified, unspecified knee: Secondary | ICD-10-CM

## 2013-08-31 DIAGNOSIS — E785 Hyperlipidemia, unspecified: Secondary | ICD-10-CM

## 2013-08-31 DIAGNOSIS — Z23 Encounter for immunization: Secondary | ICD-10-CM | POA: Diagnosis not present

## 2013-08-31 DIAGNOSIS — I8390 Asymptomatic varicose veins of unspecified lower extremity: Secondary | ICD-10-CM

## 2013-08-31 DIAGNOSIS — R635 Abnormal weight gain: Secondary | ICD-10-CM | POA: Diagnosis not present

## 2013-08-31 DIAGNOSIS — E039 Hypothyroidism, unspecified: Secondary | ICD-10-CM

## 2013-08-31 DIAGNOSIS — I10 Essential (primary) hypertension: Secondary | ICD-10-CM

## 2013-08-31 DIAGNOSIS — E669 Obesity, unspecified: Secondary | ICD-10-CM

## 2013-08-31 DIAGNOSIS — E559 Vitamin D deficiency, unspecified: Secondary | ICD-10-CM

## 2013-08-31 NOTE — Progress Notes (Signed)
Patient ID: Katherine Tyler, female   DOB: 02/06/1929, 79 y.o.   MRN: [id number] SUBJECTIVE: CC: Chief Complaint  Patient presents with  . Follow-up    wants flu shot    HPI:  Breakfast: coffee and juice Lunch:baked chicken,potato Supper: piece of apple pie, piece of chicken.  Patient is here for follow up of Diabetes Mellitus: Symptoms evaluated: Denies Nocturia ,Denies Urinary Frequency , denies Blurred vision ,deniesDizziness,denies.Dysuria,denies paresthesias, denies extremity pain or ulcers.Marland Kitchendenies chest pain. has had an annual eye exam. do check the feet. Does check CBGs. Average CBG:not checking the sugars since the metformin was discontinued. Denies episodes of hypoglycemia. Does have an emergency hypoglycemic plan. admits toCompliance with medications. Denies Problems with medications.   Past Medical History  Diagnosis Date  . Hyperlipidemia   . Hypertension   . Arthritis   . Hiatal hernia   . Anxiety   . GERD (gastroesophageal reflux disease)   . Vitamin D deficiency   . Thyroid disease   . Diabetes mellitus without complication   . Obesity    No past surgical history on file. History   Social History  . Marital Status: Widowed    Spouse Name: N/A    Number of Children: N/A  . Years of Education: N/A   Occupational History  . Not on file.   Social History Main Topics  . Smoking status: Never Smoker   . Smokeless tobacco: Not on file  . Alcohol Use: Not on file  . Drug Use: Not on file  . Sexual Activity: Not on file   Other Topics Concern  . Not on file   Social History Narrative  . No narrative on file   No family history on file. Current Outpatient Prescriptions on File Prior to Visit  Medication Sig Dispense Refill  . aspirin 81 MG tablet Take 81 mg by mouth daily.      . chlorthalidone (HYGROTON) 25 MG tablet Take 0.5 tablets (12.5 mg total) by mouth daily.  30 tablet  5  . Cholecalciferol (D3-1000) 1000 UNITS capsule Take 1,000 Units by  mouth daily.      . citalopram (CELEXA) 20 MG tablet Take 1 tablet (20 mg total) by mouth daily.  30 tablet  5  . diltiazem (DILACOR XR) 240 MG 24 hr capsule Take 1 capsule (240 mg total) by mouth 2 (two) times daily.  60 capsule  5  . Ferrous Sulfate Dried (SLOW RELEASE IRON) 45 MG TBCR Take 45 tablets by mouth daily.      . fluticasone (FLONASE) 50 MCG/ACT nasal spray PLACE 2 SPRAYS INTO THE NOSE DAILY.  16 g  3  . levothyroxine (SYNTHROID, LEVOTHROID) 112 MCG tablet Take 1 tablet (112 mcg total) by mouth daily.  30 tablet  5  . magnesium oxide (MAG-OX) 400 (241.3 MG) MG tablet TAKE 1 TABLET BY MOUTH DAILY  30 tablet  2  . losartan (COZAAR) 50 MG tablet Take 1 tablet (50 mg total) by mouth daily.  30 tablet  5   No current facility-administered medications on file prior to visit.   Allergies  Allergen Reactions  . Keflex [Cephalexin]    Immunization History  Administered Date(s) Administered  . Pneumococcal Polysaccharide 12/10/1996   Prior to Admission medications   Medication Sig Start Date End Date Taking? Authorizing Provider  aspirin 81 MG tablet Take 81 mg by mouth daily.   Yes Historical Provider, MD  chlorthalidone (HYGROTON) 25 MG tablet Take 0.5 tablets (12.5 mg total) by mouth daily.  04/16/13  Yes Ileana Ladd, MD  Cholecalciferol (D3-1000) 1000 UNITS capsule Take 1,000 Units by mouth daily.   Yes Historical Provider, MD  citalopram (CELEXA) 20 MG tablet Take 1 tablet (20 mg total) by mouth daily. 04/16/13  Yes Ileana Ladd, MD  diltiazem (DILACOR XR) 240 MG 24 hr capsule Take 1 capsule (240 mg total) by mouth 2 (two) times daily. 04/16/13  Yes Ileana Ladd, MD  Ferrous Sulfate Dried (SLOW RELEASE IRON) 45 MG TBCR Take 45 tablets by mouth daily.   Yes Historical Provider, MD  fluticasone (FLONASE) 50 MCG/ACT nasal spray PLACE 2 SPRAYS INTO THE NOSE DAILY. 08/20/13  Yes Ernestina Penna, MD  levothyroxine (SYNTHROID, LEVOTHROID) 112 MCG tablet Take 1 tablet (112 mcg total) by  mouth daily. 04/21/13  Yes Ileana Ladd, MD  magnesium oxide (MAG-OX) 400 (241.3 MG) MG tablet TAKE 1 TABLET BY MOUTH DAILY 07/05/13  Yes Ileana Ladd, MD  losartan (COZAAR) 50 MG tablet Take 1 tablet (50 mg total) by mouth daily. 04/16/13   Ileana Ladd, MD     ROS: As above in the HPI. All other systems are stable or negative.  OBJECTIVE: APPEARANCE:  Patient in no acute distress.The patient appeared well nourished and normally developed. Acyanotic. Waist: VITAL SIGNS:BP 147/72  Pulse 80  Temp(Src) 98.5 F (36.9 C) (Oral)  Ht 5\' 4"  (1.626 m)  Wt 213 lb 3.2 oz (96.707 kg)  BMI 36.58 kg/m2 WF Obesity  SKIN: warm and  Dry without overt rashes, tattoos and scars  HEAD and Neck: without JVD, Head and scalp: normal Eyes:No scleral icterus. Fundi normal, eye movements normal. Ears: Auricle normal, canal normal, Tympanic membranes normal, insufflation normal. Nose: normal Throat: normal Neck & thyroid: normal  CHEST & LUNGS: Chest wall: normal Lungs: Clear  CVS: Reveals the PMI to be normally located. Regular rhythm, First and Second Heart sounds are normal,  absence of murmurs, rubs or gallops. Peripheral vasculature: Radial pulses: normal Dorsal pedis pulses: normal Posterior pulses: normal  ABDOMEN:  Appearance:Obesity Benign, no organomegaly, no masses, no Abdominal Aortic enlargement. No Guarding , no rebound. No Bruits. Bowel sounds: normal  RECTAL: N/A GU: N/A  EXTREMETIES: nonedematous.  MUSCULOSKELETAL:  Spine: normal Joints: genu valgus  NEUROLOGIC: oriented to time,place and person; nonfocal. Strength is normal Sensory is normal Reflexes are normal Cranial Nerves are normal.  Results for orders placed in visit on 06/24/13  TSH      Result Value Range   TSH 1.816  0.350 - 4.500 uIU/mL  POCT GLYCOSYLATED HEMOGLOBIN (HGB A1C)      Result Value Range   Hemoglobin A1C 5.1      ASSESSMENT: DM (diabetes mellitus)  Genu valgum  (acquired)  HLD (hyperlipidemia)  Obesity, unspecified  Unspecified hypothyroidism  HTN (hypertension)  Unspecified vitamin D deficiency  Varicose veins  Need for prophylactic vaccination and inoculation against influenza   PLAN: Doing well off of the metformin. Keep on a healthy lifestyle. Exercise in a chair. increase vegetables in diet Same medications. Labs reviewed with patient.  Return in about 2 months (around 10/31/2013) for Recheck medical problems.  Denece Shearer P. [place] Charon, M.D. .

## 2013-10-02 ENCOUNTER — Other Ambulatory Visit: Payer: Self-pay | Admitting: Family Medicine

## 2013-10-06 ENCOUNTER — Other Ambulatory Visit: Payer: Self-pay | Admitting: Family Medicine

## 2013-10-11 ENCOUNTER — Other Ambulatory Visit: Payer: Self-pay | Admitting: Family Medicine

## 2013-10-13 ENCOUNTER — Other Ambulatory Visit: Payer: Self-pay | Admitting: Family Medicine

## 2013-10-17 ENCOUNTER — Other Ambulatory Visit: Payer: Self-pay | Admitting: Family Medicine

## 2013-10-27 ENCOUNTER — Other Ambulatory Visit: Payer: Self-pay | Admitting: Family Medicine

## 2013-10-28 NOTE — Telephone Encounter (Signed)
NTBS.

## 2013-11-10 ENCOUNTER — Encounter: Payer: Self-pay | Admitting: Family Medicine

## 2013-11-10 ENCOUNTER — Ambulatory Visit (INDEPENDENT_AMBULATORY_CARE_PROVIDER_SITE_OTHER): Payer: Medicare Other | Admitting: Family Medicine

## 2013-11-10 VITALS — BP 166/73 | HR 86 | Temp 98.4°F | Ht 64.0 in | Wt 211.6 lb

## 2013-11-10 DIAGNOSIS — I8390 Asymptomatic varicose veins of unspecified lower extremity: Secondary | ICD-10-CM

## 2013-11-10 DIAGNOSIS — E785 Hyperlipidemia, unspecified: Secondary | ICD-10-CM

## 2013-11-10 DIAGNOSIS — I10 Essential (primary) hypertension: Secondary | ICD-10-CM | POA: Diagnosis not present

## 2013-11-10 DIAGNOSIS — M21069 Valgus deformity, not elsewhere classified, unspecified knee: Secondary | ICD-10-CM

## 2013-11-10 DIAGNOSIS — E559 Vitamin D deficiency, unspecified: Secondary | ICD-10-CM | POA: Diagnosis not present

## 2013-11-10 DIAGNOSIS — E119 Type 2 diabetes mellitus without complications: Secondary | ICD-10-CM | POA: Diagnosis not present

## 2013-11-10 DIAGNOSIS — E669 Obesity, unspecified: Secondary | ICD-10-CM

## 2013-11-10 DIAGNOSIS — E039 Hypothyroidism, unspecified: Secondary | ICD-10-CM | POA: Diagnosis not present

## 2013-11-10 DIAGNOSIS — J309 Allergic rhinitis, unspecified: Secondary | ICD-10-CM

## 2013-11-10 DIAGNOSIS — J302 Other seasonal allergic rhinitis: Secondary | ICD-10-CM

## 2013-11-10 LAB — POCT GLYCOSYLATED HEMOGLOBIN (HGB A1C): Hemoglobin A1C: 4.8

## 2013-11-10 NOTE — Progress Notes (Signed)
Patient ID: Katherine Tyler, female   DOB: 08/18/1929, 79 y.o.   MRN: [id number] SUBJECTIVE: CC: Chief Complaint  Patient presents with  . Follow-up    2 month follow up    HPI: Patient is here for follow up of Diabetes Mellitus/hypothyroidism/HTN/HLD: Symptoms evaluated: Denies Nocturia ,Denies Urinary Frequency , denies Blurred vision ,deniesDizziness,denies.Dysuria,denies paresthesias, denies extremity pain or ulcers.Marland Kitchendenies chest pain. has had an annual eye exam. do check the feet. Does check CBGs. Average CBG:117 Denies episodes of hypoglycemia. Does have an emergency hypoglycemic plan. admits toCompliance with medications. Denies Problems with medications.  Past Medical History  Diagnosis Date  . Hyperlipidemia   . Hypertension   . Arthritis   . Hiatal hernia   . Anxiety   . GERD (gastroesophageal reflux disease)   . Vitamin D deficiency   . Thyroid disease   . Diabetes mellitus without complication   . Obesity    No past surgical history on file. History   Social History  . Marital Status: Widowed    Spouse Name: N/A    Number of Children: N/A  . Years of Education: N/A   Occupational History  . Not on file.   Social History Main Topics  . Smoking status: Never Smoker   . Smokeless tobacco: Not on file  . Alcohol Use: Not on file  . Drug Use: Not on file  . Sexual Activity: Not on file   Other Topics Concern  . Not on file   Social History Narrative  . No narrative on file   No family history on file. Current Outpatient Prescriptions on File Prior to Visit  Medication Sig Dispense Refill  . aspirin 81 MG tablet Take 81 mg by mouth daily.      . chlorthalidone (HYGROTON) 25 MG tablet Take 0.5 tablets (12.5 mg total) by mouth daily.  30 tablet  5  . Cholecalciferol (D3-1000) 1000 UNITS capsule Take 1,000 Units by mouth daily.      . citalopram (CELEXA) 20 MG tablet TAKE 1 TABLET (20 MG TOTAL) BY MOUTH DAILY.  30 tablet  0  . diltiazem (DILACOR XR) 240 MG  24 hr capsule TAKE 1 CAPSULE (240 MG TOTAL) BY MOUTH 2 (TWO) TIMES DAILY.  60 capsule  2  . Ferrous Sulfate Dried (SLOW RELEASE IRON) 45 MG TBCR Take 45 tablets by mouth daily.      . fluticasone (FLONASE) 50 MCG/ACT nasal spray PLACE 2 SPRAYS INTO THE NOSE DAILY.  16 g  3  . levothyroxine (SYNTHROID, LEVOTHROID) 112 MCG tablet TAKE 1 TABLET (112 MCG TOTAL) BY MOUTH DAILY.  30 tablet  6  . losartan (COZAAR) 50 MG tablet TAKE 1 TABLET (50 MG TOTAL) BY MOUTH DAILY.  30 tablet  4  . magnesium oxide (MAG-OX) 400 (241.3 MG) MG tablet TAKE 1 TABLET BY MOUTH DAILY  30 tablet  3   No current facility-administered medications on file prior to visit.   Allergies  Allergen Reactions  . Keflex [Cephalexin]    Immunization History  Administered Date(s) Administered  . Influenza,inj,Quad PF,36+ Mos 08/31/2013  . Pneumococcal Polysaccharide-23 12/10/1996   Prior to Admission medications   Medication Sig Start Date End Date Taking? Authorizing Provider  aspirin 81 MG tablet Take 81 mg by mouth daily.    Historical Provider, MD  chlorthalidone (HYGROTON) 25 MG tablet Take 0.5 tablets (12.5 mg total) by mouth daily. 04/16/13   Ileana Ladd, MD  Cholecalciferol (D3-1000) 1000 UNITS capsule Take 1,000 Units by mouth  daily.    Historical Provider, MD  citalopram (CELEXA) 20 MG tablet TAKE 1 TABLET (20 MG TOTAL) BY MOUTH DAILY. 10/27/13   Ileana Ladd, MD  diltiazem (DILACOR XR) 240 MG 24 hr capsule TAKE 1 CAPSULE (240 MG TOTAL) BY MOUTH 2 (TWO) TIMES DAILY. 10/11/13   Ileana Ladd, MD  Ferrous Sulfate Dried (SLOW RELEASE IRON) 45 MG TBCR Take 45 tablets by mouth daily.    Historical Provider, MD  fluticasone (FLONASE) 50 MCG/ACT nasal spray PLACE 2 SPRAYS INTO THE NOSE DAILY. 08/20/13   Ernestina Penna, MD  levothyroxine (SYNTHROID, LEVOTHROID) 112 MCG tablet TAKE 1 TABLET (112 MCG TOTAL) BY MOUTH DAILY. 10/17/13   Ileana Ladd, MD  losartan (COZAAR) 50 MG tablet TAKE 1 TABLET (50 MG TOTAL) BY MOUTH DAILY.  10/02/13   Ileana Ladd, MD  magnesium oxide (MAG-OX) 400 (241.3 MG) MG tablet TAKE 1 TABLET BY MOUTH DAILY 10/06/13   Ileana Ladd, MD     ROS: As above in the HPI. All other systems are stable or negative.  OBJECTIVE: APPEARANCE:  Patient in no acute distress.The patient appeared well nourished and normally developed. Acyanotic. Waist: VITAL SIGNS:BP 166/73  Pulse 86  Temp(Src) 98.4 F (36.9 C) (Oral)  Ht 5\' 4"  (1.626 m)  Wt 211 lb 9.6 oz (95.981 kg)  BMI 36.30 kg/m2  WF obese  SKIN: warm and  Dry without overt rashes, tattoos and scars  HEAD and Neck: without JVD, Head and scalp: normal Eyes:No scleral icterus. Fundi normal, eye movements normal. Ears: Auricle normal, canal normal, Tympanic membranes normal, insufflation normal. Nose: normal Throat: normal Neck & thyroid: normal  CHEST & LUNGS: Chest wall: normal Lungs: Clear  CVS: Reveals the PMI to be normally located. Regular rhythm, First and Second Heart sounds are normal,  absence of murmurs, rubs or gallops. Peripheral vasculature: Radial pulses: normal Dorsal pedis pulses: normal Posterior pulses: normal  ABDOMEN:  Appearance: obese Benign, no organomegaly, no masses, no Abdominal Aortic enlargement. No Guarding , no rebound. No Bruits. Bowel sounds: normal  RECTAL: N/A GU: N/A  EXTREMETIES: nonedematous.  MUSCULOSKELETAL:  Spine: reduced ROM Joints: hip and knees reduced ROM. Crepitus of knees.  Genu Valgum of the left knee.  NEUROLOGIC: oriented to time,place and person; nonfocal. Strength is normal Sensory is normal Reflexes are normal Cranial Nerves are normal.  ASSESSMENT:  HTN (hypertension) - Plan: CMP14+EGFR  HLD (hyperlipidemia) - Plan: CMP14+EGFR, NMR, lipoprofile  DM (diabetes mellitus) - Plan: POCT glycosylated hemoglobin (Hb A1C)  Obesity, unspecified  Varicose veins  Unspecified vitamin D deficiency - Plan: Vit D  25 hydroxy (rtn osteoporosis  monitoring)  Unspecified hypothyroidism - Plan: TSH  Seasonal allergic rhinitis  Genu valgum (acquired)  PLAN:   DASH Diet Monitor BP and record and bring in. Plant based diet to help in control of medical problems and weight loss Exercise : water based, machine based. Because of arthritis of the knee arthritis and genu Valgum. Orders Placed This Encounter  Procedures  . CMP14+EGFR  . TSH  . NMR, lipoprofile  . Vit D  25 hydroxy (rtn osteoporosis monitoring)  . POCT glycosylated hemoglobin (Hb A1C)   No orders of the defined types were placed in this encounter.   There are no discontinued medications. Return in about 4 weeks (around 12/08/2013) for recheck BP.  Herlinda Heady P. [place] Charon, M.D.

## 2013-11-10 NOTE — Patient Instructions (Signed)
DASH Diet  The DASH diet stands for "Dietary Approaches to Stop Hypertension." It is a healthy eating plan that has been shown to reduce high blood pressure (hypertension) in as little as 14 days, while also possibly providing other significant health benefits. These other health benefits include reducing the risk of breast cancer after menopause and reducing the risk of type 2 diabetes, heart disease, colon cancer, and stroke. Health benefits also include weight loss and slowing kidney failure in patients with chronic kidney disease.   DIET GUIDELINES  · Limit salt (sodium). Your diet should contain less than 1500 mg of sodium daily.  · Limit refined or processed carbohydrates. Your diet should include mostly whole grains. Desserts and added sugars should be used sparingly.  · Include small amounts of heart-healthy fats. These types of fats include nuts, oils, and tub margarine. Limit saturated and trans fats. These fats have been shown to be harmful in the body.  CHOOSING FOODS   The following food groups are based on a 2000 calorie diet. See your Registered Dietitian for individual calorie needs.  Grains and Grain Products (6 to 8 servings daily)  · Eat More Often: Whole-wheat bread, brown rice, whole-grain or wheat pasta, quinoa, popcorn without added fat or salt (air popped).  · Eat Less Often: White bread, white pasta, white rice, cornbread.  Vegetables (4 to 5 servings daily)  · Eat More Often: Fresh, frozen, and canned vegetables. Vegetables may be raw, steamed, roasted, or grilled with a minimal amount of fat.  · Eat Less Often/Avoid: Creamed or fried vegetables. Vegetables in a cheese sauce.  Fruit (4 to 5 servings daily)  · Eat More Often: All fresh, canned (in natural juice), or frozen fruits. Dried fruits without added sugar. One hundred percent fruit juice (½ cup [237 mL] daily).  · Eat Less Often: Dried fruits with added sugar. Canned fruit in light or heavy syrup.  Lean Meats, Fish, and Poultry (2  servings or less daily. One serving is 3 to 4 oz [85-114 g]).  · Eat More Often: Ninety percent or leaner ground beef, tenderloin, sirloin. Round cuts of beef, chicken breast, turkey breast. All fish. Grill, bake, or broil your meat. Nothing should be fried.  · Eat Less Often/Avoid: Fatty cuts of meat, turkey, or chicken leg, thigh, or wing. Fried cuts of meat or fish.  Dairy (2 to 3 servings)  · Eat More Often: Low-fat or fat-free milk, low-fat plain or light yogurt, reduced-fat or part-skim cheese.  · Eat Less Often/Avoid: Milk (whole, 2%). Whole milk yogurt. Full-fat cheeses.  Nuts, Seeds, and Legumes (4 to 5 servings per week)  · Eat More Often: All without added salt.  · Eat Less Often/Avoid: Salted nuts and seeds, canned beans with added salt.  Fats and Sweets (limited)  · Eat More Often: Vegetable oils, tub margarines without trans fats, sugar-free gelatin. Mayonnaise and salad dressings.  · Eat Less Often/Avoid: Coconut oils, palm oils, butter, stick margarine, cream, half and half, cookies, candy, pie.  FOR MORE INFORMATION  The Dash Diet Eating Plan: www.dashdiet.org  Document Released: 11/15/2011 Document Revised: 02/18/2012 Document Reviewed: 11/15/2011  [employer]® Patient Information ©2014 [employer].

## 2013-11-12 ENCOUNTER — Other Ambulatory Visit: Payer: Self-pay | Admitting: Family Medicine

## 2013-11-12 DIAGNOSIS — E039 Hypothyroidism, unspecified: Secondary | ICD-10-CM

## 2013-11-12 LAB — NMR, LIPOPROFILE
Cholesterol: 161 mg/dL (ref <200)
HDL Cholesterol by NMR: 58 mg/dL (ref >=40)
HDL Particle Number: 34.2 umol/L (ref >=30.5)
LDL Particle Number: 1397 nmol/L — ABNORMAL HIGH (ref <1000)
LDL Size: 21.4 nm (ref >20.5)
LDLC SERPL CALC-MCNC: 86 mg/dL (ref <100)
LP-IR Score: <25 (ref <=45)
Small LDL Particle Number: 95 nmol/L (ref <=527)
Triglycerides by NMR: 85 mg/dL (ref <150)

## 2013-11-12 LAB — CMP14+EGFR
ALT: 13 IU/L (ref 0–32)
AST: 20 IU/L (ref 0–40)
Albumin/Globulin Ratio: 1.7 (ref 1.1–2.5)
Albumin: 4.4 g/dL (ref 3.5–4.7)
Alkaline Phosphatase: 75 IU/L (ref 39–117)
BUN/Creatinine Ratio: 19 (ref 11–26)
BUN: 21 mg/dL (ref 8–27)
CO2: 24 mmol/L (ref 18–29)
Calcium: 9.7 mg/dL (ref 8.6–10.2)
Chloride: 92 mmol/L — ABNORMAL LOW (ref 97–108)
Creatinine, Ser: 1.13 mg/dL — ABNORMAL HIGH (ref 0.57–1.00)
GFR calc Af Amer: 52 mL/min/{1.73_m2} — ABNORMAL LOW (ref >59)
GFR calc non Af Amer: 45 mL/min/{1.73_m2} — ABNORMAL LOW (ref >59)
Globulin, Total: 2.6 g/dL (ref 1.5–4.5)
Glucose: 89 mg/dL (ref 65–99)
Potassium: 4.3 mmol/L (ref 3.5–5.2)
Sodium: 132 mmol/L — ABNORMAL LOW (ref 134–144)
Total Bilirubin: 0.4 mg/dL (ref 0.0–1.2)
Total Protein: 7 g/dL (ref 6.0–8.5)

## 2013-11-12 LAB — VITAMIN D 25 HYDROXY (VIT D DEFICIENCY, FRACTURES): Vit D, 25-Hydroxy: 28.9 ng/mL — ABNORMAL LOW (ref 30.0–100.0)

## 2013-11-12 LAB — TSH: TSH: 6.14 u[IU]/mL — ABNORMAL HIGH (ref 0.450–4.500)

## 2013-11-12 MED ORDER — LEVOTHYROXINE SODIUM 125 MCG PO TABS: ORAL_TABLET | ORAL | Status: DC

## 2013-11-12 NOTE — Progress Notes (Signed)
Quick Note:  Call Patient Labs abnormal: Needs to increase the dose of her thyroid medication. Ordered in EPIC. Also needs to increase her vit D to 2000 units daily.double from the 1000 units  Recommendations: Keep the follow up in 4 weeks.   ______

## 2013-11-27 ENCOUNTER — Other Ambulatory Visit: Payer: Self-pay | Admitting: Family Medicine

## 2013-12-09 ENCOUNTER — Encounter: Payer: Self-pay | Admitting: Family Medicine

## 2013-12-09 ENCOUNTER — Ambulatory Visit (INDEPENDENT_AMBULATORY_CARE_PROVIDER_SITE_OTHER): Payer: Medicare Other | Admitting: Family Medicine

## 2013-12-09 ENCOUNTER — Ambulatory Visit: Payer: Medicare Other | Admitting: Family Medicine

## 2013-12-09 VITALS — BP 173/62 | HR 82 | Temp 98.1°F | Ht 64.0 in | Wt 211.4 lb

## 2013-12-09 DIAGNOSIS — E559 Vitamin D deficiency, unspecified: Secondary | ICD-10-CM | POA: Diagnosis not present

## 2013-12-09 DIAGNOSIS — M21069 Valgus deformity, not elsewhere classified, unspecified knee: Secondary | ICD-10-CM

## 2013-12-09 DIAGNOSIS — E785 Hyperlipidemia, unspecified: Secondary | ICD-10-CM

## 2013-12-09 DIAGNOSIS — E669 Obesity, unspecified: Secondary | ICD-10-CM

## 2013-12-09 DIAGNOSIS — E119 Type 2 diabetes mellitus without complications: Secondary | ICD-10-CM

## 2013-12-09 DIAGNOSIS — I10 Essential (primary) hypertension: Secondary | ICD-10-CM | POA: Diagnosis not present

## 2013-12-09 DIAGNOSIS — E039 Hypothyroidism, unspecified: Secondary | ICD-10-CM

## 2013-12-09 DIAGNOSIS — I8390 Asymptomatic varicose veins of unspecified lower extremity: Secondary | ICD-10-CM

## 2013-12-09 NOTE — Progress Notes (Signed)
Patient ID: Katherine Tyler, female   DOB: 07-02-1929, 79 y.o.   MRN: [id number] SUBJECTIVE: CC: Chief Complaint  Patient presents with  . Follow-up    4 week follow up BP changed pharmacy needs all new refills     HPI: Patient is here for follow up of hypertension: denies Headache;deniesChest Pain;denies weakness;denies Shortness of Breath or Orthopnea;denies Visual changes;denies palpitations;denies cough;denies pedal edema;denies symptoms of TIA or stroke; admits to Compliance with medications. denies Problems with medications. Claims that her BP is 119/57.  Past Medical History  Diagnosis Date  . Hyperlipidemia   . Hypertension   . Arthritis   . Hiatal hernia   . Anxiety   . GERD (gastroesophageal reflux disease)   . Vitamin D deficiency   . Thyroid disease   . Diabetes mellitus without complication   . Obesity    No past surgical history on file. History   Social History  . Marital Status: Widowed    Spouse Name: N/A    Number of Children: N/A  . Years of Education: N/A   Occupational History  . Not on file.   Social History Main Topics  . Smoking status: Never Smoker   . Smokeless tobacco: Not on file  . Alcohol Use: Not on file  . Drug Use: Not on file  . Sexual Activity: Not on file   Other Topics Concern  . Not on file   Social History Narrative  . No narrative on file   No family history on file. Current Outpatient Prescriptions on File Prior to Visit  Medication Sig Dispense Refill  . aspirin 81 MG tablet Take 81 mg by mouth daily.      . chlorthalidone (HYGROTON) 25 MG tablet Take 0.5 tablets (12.5 mg total) by mouth daily.  30 tablet  5  . Cholecalciferol (D3-1000) 1000 UNITS capsule Take 2,000 Units by mouth daily.       . citalopram (CELEXA) 20 MG tablet TAKE 1 TABLET (20 MG TOTAL) BY MOUTH DAILY.  30 tablet  2  . diltiazem (DILACOR XR) 240 MG 24 hr capsule TAKE 1 CAPSULE (240 MG TOTAL) BY MOUTH 2 (TWO) TIMES DAILY.  60 capsule  2  . Ferrous Sulfate  Dried (SLOW RELEASE IRON) 45 MG TBCR Take 45 tablets by mouth daily.      . fluticasone (FLONASE) 50 MCG/ACT nasal spray PLACE 2 SPRAYS INTO THE NOSE DAILY.  16 g  3  . levothyroxine (SYNTHROID, LEVOTHROID) 125 MCG tablet TAKE 1 TABLET (112 MCG TOTAL) BY MOUTH DAILY.  30 tablet  6  . losartan (COZAAR) 50 MG tablet TAKE 1 TABLET (50 MG TOTAL) BY MOUTH DAILY.  30 tablet  4  . magnesium oxide (MAG-OX) 400 (241.3 MG) MG tablet TAKE 1 TABLET BY MOUTH DAILY  30 tablet  3   No current facility-administered medications on file prior to visit.   Allergies  Allergen Reactions  . Keflex [Cephalexin]    Immunization History  Administered Date(s) Administered  . Influenza,inj,Quad PF,36+ Mos 08/31/2013  . Pneumococcal Polysaccharide-23 12/10/1996   Prior to Admission medications   Medication Sig Start Date End Date Taking? Authorizing Provider  aspirin 81 MG tablet Take 81 mg by mouth daily.    Historical Provider, MD  chlorthalidone (HYGROTON) 25 MG tablet Take 0.5 tablets (12.5 mg total) by mouth daily. 04/16/13   Ileana Ladd, MD  Cholecalciferol (D3-1000) 1000 UNITS capsule Take 1,000 Units by mouth daily.    Historical Provider, MD  citalopram (CELEXA)  20 MG tablet TAKE 1 TABLET (20 MG TOTAL) BY MOUTH DAILY. 11/27/13   Ileana Ladd, MD  diltiazem (DILACOR XR) 240 MG 24 hr capsule TAKE 1 CAPSULE (240 MG TOTAL) BY MOUTH 2 (TWO) TIMES DAILY. 10/11/13   Ileana Ladd, MD  Ferrous Sulfate Dried (SLOW RELEASE IRON) 45 MG TBCR Take 45 tablets by mouth daily.    Historical Provider, MD  fluticasone (FLONASE) 50 MCG/ACT nasal spray PLACE 2 SPRAYS INTO THE NOSE DAILY. 08/20/13   Ernestina Penna, MD  levothyroxine (SYNTHROID, LEVOTHROID) 125 MCG tablet TAKE 1 TABLET (112 MCG TOTAL) BY MOUTH DAILY. 11/12/13   Ileana Ladd, MD  losartan (COZAAR) 50 MG tablet TAKE 1 TABLET (50 MG TOTAL) BY MOUTH DAILY. 10/02/13   Ileana Ladd, MD  magnesium oxide (MAG-OX) 400 (241.3 MG) MG tablet TAKE 1 TABLET BY MOUTH  DAILY 10/06/13   Ileana Ladd, MD     ROS: As above in the HPI. All other systems are stable or negative.  OBJECTIVE: APPEARANCE:  Patient in no acute distress.The patient appeared well nourished and normally developed. Acyanotic. Waist: VITAL SIGNS:BP 173/62  Pulse 82  Temp(Src) 98.1 F (36.7 C) (Oral)  Ht 5\' 4"  (1.626 m)  Wt 211 lb 6.4 oz (95.89 kg)  BMI 36.27 kg/m2  Obese WF SKIN: warm and  Dry without overt rashes, tattoos and scars  HEAD and Neck: without JVD, Head and scalp: normal Eyes:No scleral icterus. Fundi normal, eye movements normal. Ears: Auricle normal, canal normal, Tympanic membranes normal, insufflation normal. Nose: normal Throat: normal Neck & thyroid: normal  CHEST & LUNGS: Chest wall: normal Lungs: Clear  CVS: Reveals the PMI to be normally located. Regular rhythm, First and Second Heart sounds are normal,  absence of murmurs, rubs or gallops. Peripheral vasculature: Radial pulses: normal Dorsal pedis pulses: normal Posterior pulses: normal  ABDOMEN:  Appearance: normal Benign, no organomegaly, no masses, no Abdominal Aortic enlargement. No Guarding , no rebound. No Bruits. Bowel sounds: normal  RECTAL: N/A GU: N/A  EXTREMETIES: nonedematous.  MUSCULOSKELETAL:  Spine: normal Joints: genu Valgum  NEUROLOGIC: oriented to time,place and person; nonfocal. Results for orders placed in visit on 11/10/13  CMP14+EGFR      Result Value Range   Glucose 89  65 - 99 mg/dL   BUN 21  8 - 27 mg/dL   Creatinine, Ser 1.61 (*) 0.57 - 1.00 mg/dL   GFR calc non Af Amer 45 (*) >59 mL/min/1.73   GFR calc Af Amer 52 (*) >59 mL/min/1.73   BUN/Creatinine Ratio 19  11 - 26   Sodium 132 (*) 134 - 144 mmol/L   Potassium 4.3  3.5 - 5.2 mmol/L   Chloride 92 (*) 97 - 108 mmol/L   CO2 24  18 - 29 mmol/L   Calcium 9.7  8.6 - 10.2 mg/dL   Total Protein 7.0  6.0 - 8.5 g/dL   Albumin 4.4  3.5 - 4.7 g/dL   Globulin, Total 2.6  1.5 - 4.5 g/dL    Albumin/Globulin Ratio 1.7  1.1 - 2.5   Total Bilirubin 0.4  0.0 - 1.2 mg/dL   Alkaline Phosphatase 75  39 - 117 IU/L   AST 20  0 - 40 IU/L   ALT 13  0 - 32 IU/L  TSH      Result Value Range   TSH 6.140 (*) 0.450 - 4.500 uIU/mL  NMR, LIPOPROFILE      Result Value Range   LDL Particle  Number [id number] (*) <1000 nmol/L   LDLC SERPL CALC-MCNC 86  <100 mg/dL   HDL Cholesterol by NMR 58  >=40 mg/dL   Triglycerides by NMR 85  <150 mg/dL   Cholesterol 119  <147 mg/dL   HDL Particle Number 82.9  >=56.2 umol/L   Small LDL Particle Number 95  <=527 nmol/L   LDL Size 21.4  >20.5 nm   LP-IR Score < 25  <= 45  VITAMIN D 25 HYDROXY      Result Value Range   Vit D, 25-Hydroxy 28.9 (*) 30.0 - 100.0 ng/mL  POCT GLYCOSYLATED HEMOGLOBIN (HGB A1C)      Result Value Range   Hemoglobin A1C 4.8      ASSESSMENT: DM (diabetes mellitus)  HLD (hyperlipidemia)  HTN (hypertension)  Obesity, unspecified  Unspecified vitamin D deficiency  Varicose veins  Unspecified hypothyroidism  Genu valgum (acquired)  BP not at goal. Patient needed to bring her machine to compare. She claims to have white coat syndrome.  PLAN:  No orders of the defined types were placed in this encounter.   Meds ordered this encounter  Medications  . DISCONTD: diltiazem (CARDIZEM CD) 240 MG 24 hr capsule    Sig:    Medications Discontinued During This Encounter  Medication Reason  . diltiazem (CARDIZEM CD) 240 MG 24 hr capsule Duplicate   Return in about 2 days (around 12/11/2013) for recheck BP and bring machine in. to compare.  Neal Oshea P. [place] Charon, M.D.

## 2013-12-11 ENCOUNTER — Ambulatory Visit (INDEPENDENT_AMBULATORY_CARE_PROVIDER_SITE_OTHER): Payer: Medicare Other | Admitting: *Deleted

## 2013-12-11 VITALS — BP 156/78 | HR 80

## 2013-12-11 DIAGNOSIS — I10 Essential (primary) hypertension: Secondary | ICD-10-CM

## 2013-12-11 NOTE — Progress Notes (Signed)
Patient came in today to check the accuracy of her home blood pressure monitors.  She has a wrist cuff and an arm cuff. I checked her blood pressure with both of her cuffs and compared to our automatic cuff and my manual reading. Our automatic cuff and my manual reading were consistent. Her cuffs read much higher. Advised patient that I would forward these readings to Dr. Modesto CharonWong and that we would let her know when to f/u.

## 2013-12-13 ENCOUNTER — Other Ambulatory Visit: Payer: Self-pay | Admitting: Family Medicine

## 2013-12-13 MED ORDER — LOSARTAN POTASSIUM 100 MG PO TABS: 100.0000 mg | ORAL_TABLET | Freq: Every day | ORAL | Status: DC

## 2013-12-13 NOTE — Progress Notes (Signed)
BP not at goal. Will need to increase the losartan. Will double dose to 100 mg daily. Follow up office visit in 2 weeks.

## 2013-12-14 NOTE — Progress Notes (Signed)
See note. I increased the losartan and she is to have a follow up in a couple of weeks with me to check BP AND BMP. Signe Tackitt P. [place] CharonWong, M.D.

## 2013-12-14 NOTE — Progress Notes (Signed)
Discussed recommendation with patient and appointment scheduled to follow-up in 2 weeks.

## 2013-12-29 ENCOUNTER — Ambulatory Visit (INDEPENDENT_AMBULATORY_CARE_PROVIDER_SITE_OTHER): Payer: Medicare Other | Admitting: Family Medicine

## 2013-12-29 ENCOUNTER — Encounter: Payer: Self-pay | Admitting: Family Medicine

## 2013-12-29 VITALS — BP 163/67 | HR 84 | Temp 97.4°F | Ht 64.0 in | Wt 208.8 lb

## 2013-12-29 DIAGNOSIS — E039 Hypothyroidism, unspecified: Secondary | ICD-10-CM

## 2013-12-29 DIAGNOSIS — M21069 Valgus deformity, not elsewhere classified, unspecified knee: Secondary | ICD-10-CM

## 2013-12-29 DIAGNOSIS — I8390 Asymptomatic varicose veins of unspecified lower extremity: Secondary | ICD-10-CM | POA: Diagnosis not present

## 2013-12-29 DIAGNOSIS — E559 Vitamin D deficiency, unspecified: Secondary | ICD-10-CM | POA: Diagnosis not present

## 2013-12-29 DIAGNOSIS — E785 Hyperlipidemia, unspecified: Secondary | ICD-10-CM

## 2013-12-29 DIAGNOSIS — E119 Type 2 diabetes mellitus without complications: Secondary | ICD-10-CM | POA: Diagnosis not present

## 2013-12-29 DIAGNOSIS — E669 Obesity, unspecified: Secondary | ICD-10-CM

## 2013-12-29 DIAGNOSIS — J302 Other seasonal allergic rhinitis: Secondary | ICD-10-CM

## 2013-12-29 DIAGNOSIS — J309 Allergic rhinitis, unspecified: Secondary | ICD-10-CM

## 2013-12-29 DIAGNOSIS — I10 Essential (primary) hypertension: Secondary | ICD-10-CM

## 2013-12-29 MED ORDER — TERAZOSIN HCL 1 MG PO CAPS: 1.0000 mg | ORAL_CAPSULE | Freq: Every day | ORAL | Status: DC

## 2013-12-29 NOTE — Patient Instructions (Signed)
DASH Diet  The DASH diet stands for "Dietary Approaches to Stop Hypertension." It is a healthy eating plan that has been shown to reduce high blood pressure (hypertension) in as little as 14 days, while also possibly providing other significant health benefits. These other health benefits include reducing the risk of breast cancer after menopause and reducing the risk of type 2 diabetes, heart disease, colon cancer, and stroke. Health benefits also include weight loss and slowing kidney failure in patients with chronic kidney disease.   DIET GUIDELINES  · Limit salt (sodium). Your diet should contain less than 1500 mg of sodium daily.  · Limit refined or processed carbohydrates. Your diet should include mostly whole grains. Desserts and added sugars should be used sparingly.  · Include small amounts of heart-healthy fats. These types of fats include nuts, oils, and tub margarine. Limit saturated and trans fats. These fats have been shown to be harmful in the body.  CHOOSING FOODS   The following food groups are based on a 2000 calorie diet. See your Registered Dietitian for individual calorie needs.  Grains and Grain Products (6 to 8 servings daily)  · Eat More Often: Whole-wheat bread, brown rice, whole-grain or wheat pasta, quinoa, popcorn without added fat or salt (air popped).  · Eat Less Often: White bread, white pasta, white rice, cornbread.  Vegetables (4 to 5 servings daily)  · Eat More Often: Fresh, frozen, and canned vegetables. Vegetables may be raw, steamed, roasted, or grilled with a minimal amount of fat.  · Eat Less Often/Avoid: Creamed or fried vegetables. Vegetables in a cheese sauce.  Fruit (4 to 5 servings daily)  · Eat More Often: All fresh, canned (in natural juice), or frozen fruits. Dried fruits without added sugar. One hundred percent fruit juice (½ cup [237 mL] daily).  · Eat Less Often: Dried fruits with added sugar. Canned fruit in light or heavy syrup.  Lean Meats, Fish, and Poultry (2  servings or less daily. One serving is 3 to 4 oz [85-114 g]).  · Eat More Often: Ninety percent or leaner ground beef, tenderloin, sirloin. Round cuts of beef, chicken breast, turkey breast. All fish. Grill, bake, or broil your meat. Nothing should be fried.  · Eat Less Often/Avoid: Fatty cuts of meat, turkey, or chicken leg, thigh, or wing. Fried cuts of meat or fish.  Dairy (2 to 3 servings)  · Eat More Often: Low-fat or fat-free milk, low-fat plain or light yogurt, reduced-fat or part-skim cheese.  · Eat Less Often/Avoid: Milk (whole, 2%). Whole milk yogurt. Full-fat cheeses.  Nuts, Seeds, and Legumes (4 to 5 servings per week)  · Eat More Often: All without added salt.  · Eat Less Often/Avoid: Salted nuts and seeds, canned beans with added salt.  Fats and Sweets (limited)  · Eat More Often: Vegetable oils, tub margarines without trans fats, sugar-free gelatin. Mayonnaise and salad dressings.  · Eat Less Often/Avoid: Coconut oils, palm oils, butter, stick margarine, cream, half and half, cookies, candy, pie.  FOR MORE INFORMATION  The Dash Diet Eating Plan: www.dashdiet.org  Document Released: 11/15/2011 Document Revised: 02/18/2012 Document Reviewed: 11/15/2011  [employer]® Patient Information ©2014 [employer].

## 2013-12-29 NOTE — Progress Notes (Signed)
Patient ID: Katherine Tyler, female   DOB: May 30, 1929, 80 y.o.   MRN: [id number] SUBJECTIVE: CC: Chief Complaint  Patient presents with  . Follow-up    2 WEEK FOLLOW UP BP    HPI: Patient is here for follow up of hypertension: denies Headache;deniesChest Pain;denies weakness;denies Shortness of Breath or Orthopnea;denies Visual changes;denies palpitations;denies cough;denies pedal edema;denies symptoms of TIA or stroke; admits to Compliance with medications.!!!! denies Problems with medications. BP still running high. She checks it with a wrist BP machine and had compared it the last time and it was close to our reading.   Past Medical History  Diagnosis Date  . Hyperlipidemia   . Hypertension   . Arthritis   . Hiatal hernia   . Anxiety   . GERD (gastroesophageal reflux disease)   . Vitamin D deficiency   . Thyroid disease   . Diabetes mellitus without complication   . Obesity    No past surgical history on file. History   Social History  . Marital Status: Widowed    Spouse Name: N/A    Number of Children: N/A  . Years of Education: N/A   Occupational History  . Not on file.   Social History Main Topics  . Smoking status: Never Smoker   . Smokeless tobacco: Not on file  . Alcohol Use: Not on file  . Drug Use: Not on file  . Sexual Activity: Not on file   Other Topics Concern  . Not on file   Social History Narrative  . No narrative on file   No family history on file. Current Outpatient Prescriptions on File Prior to Visit  Medication Sig Dispense Refill  . aspirin 81 MG tablet Take 81 mg by mouth daily.      . chlorthalidone (HYGROTON) 25 MG tablet Take 0.5 tablets (12.5 mg total) by mouth daily.  30 tablet  5  . Cholecalciferol (D3-1000) 1000 UNITS capsule Take 2,000 Units by mouth daily.       . citalopram (CELEXA) 20 MG tablet TAKE 1 TABLET (20 MG TOTAL) BY MOUTH DAILY.  30 tablet  2  . diltiazem (DILACOR XR) 240 MG 24 hr capsule TAKE 1 CAPSULE (240 MG TOTAL)  BY MOUTH 2 (TWO) TIMES DAILY.  60 capsule  2  . Ferrous Sulfate Dried (SLOW RELEASE IRON) 45 MG TBCR Take 45 tablets by mouth daily.      . fluticasone (FLONASE) 50 MCG/ACT nasal spray PLACE 2 SPRAYS INTO THE NOSE DAILY.  16 g  3  . levothyroxine (SYNTHROID, LEVOTHROID) 125 MCG tablet TAKE 1 TABLET (112 MCG TOTAL) BY MOUTH DAILY.  30 tablet  6  . losartan (COZAAR) 100 MG tablet Take 1 tablet (100 mg total) by mouth daily.  30 tablet  4  . magnesium oxide (MAG-OX) 400 (241.3 MG) MG tablet TAKE 1 TABLET BY MOUTH DAILY  30 tablet  3   No current facility-administered medications on file prior to visit.   Allergies  Allergen Reactions  . Keflex [Cephalexin]    Immunization History  Administered Date(s) Administered  . Influenza,inj,Quad PF,36+ Mos 08/31/2013  . Pneumococcal Polysaccharide-23 12/10/1996   Prior to Admission medications   Medication Sig Start Date End Date Taking? Authorizing Provider  aspirin 81 MG tablet Take 81 mg by mouth daily.   Yes Historical Provider, MD  chlorthalidone (HYGROTON) 25 MG tablet Take 0.5 tablets (12.5 mg total) by mouth daily. 04/16/13  Yes Vernie Shanks, MD  Cholecalciferol (D3-1000) 1000 UNITS capsule Take  2,000 Units by mouth daily.    Yes Historical Provider, MD  citalopram (CELEXA) 20 MG tablet TAKE 1 TABLET (20 MG TOTAL) BY MOUTH DAILY. 11/27/13  Yes Vernie Shanks, MD  diltiazem (DILACOR XR) 240 MG 24 hr capsule TAKE 1 CAPSULE (240 MG TOTAL) BY MOUTH 2 (TWO) TIMES DAILY. 10/11/13  Yes Vernie Shanks, MD  Ferrous Sulfate Dried (SLOW RELEASE IRON) 45 MG TBCR Take 45 tablets by mouth daily.   Yes Historical Provider, MD  fluticasone (FLONASE) 50 MCG/ACT nasal spray PLACE 2 SPRAYS INTO THE NOSE DAILY. 08/20/13  Yes Chipper Herb, MD  levothyroxine (SYNTHROID, LEVOTHROID) 125 MCG tablet TAKE 1 TABLET (112 MCG TOTAL) BY MOUTH DAILY. 11/12/13  Yes Vernie Shanks, MD  losartan (COZAAR) 100 MG tablet Take 1 tablet (100 mg total) by mouth daily. 12/13/13  Yes  Vernie Shanks, MD  magnesium oxide (MAG-OX) 400 (241.3 MG) MG tablet TAKE 1 TABLET BY MOUTH DAILY 10/06/13  Yes Vernie Shanks, MD     ROS: As above in the HPI. All other systems are stable or negative.  OBJECTIVE: APPEARANCE:  Patient in no acute distress.The patient appeared well nourished and normally developed. Acyanotic. Waist: VITAL SIGNS:BP 163/67  Pulse 84  Temp(Src) 97.4 F (36.3 C) (Oral)  Ht _0  (1.626 m)  Wt 208 lb 12.8 oz (94.711 kg)  BMI 35.82 kg/m2  OBbese elderly WF Limps due to OA of knees  SKIN: warm and  Dry without overt rashes, tattoos and scars  HEAD and Neck: without JVD, Head and scalp: normal Eyes:No scleral icterus. Fundi normal, eye movements normal. Ears: Auricle normal, canal normal, Tympanic membranes normal, insufflation normal. Nose: normal Throat: normal Neck & thyroid: normal  CHEST & LUNGS: Chest wall: normal Lungs: Clear  CVS: Reveals the PMI to be normally located. Regular rhythm, First and Second Heart sounds are normal,  absence of murmurs, rubs or gallops. Peripheral vasculature: Radial pulses: normal Dorsal pedis pulses: normal Posterior pulses: normal  ABDOMEN:  Appearance: obese. Benign, no organomegaly, no masses, no Abdominal Aortic enlargement. No Guarding , no rebound. No Bruits. Bowel sounds: normal  RECTAL: N/A GU: N/A  EXTREMETIES: nonedematous.  MUSCULOSKELETAL:  Spine: reduced ROM Joints: Genu Valgus with crepitus of knees.   NEUROLOGIC: oriented to time,place and person; nonfocal.  Results for orders placed in visit on 11/10/13  CMP14+EGFR      Result Value Range   Glucose 89  65 - 99 mg/dL   BUN 21  8 - 27 mg/dL   Creatinine, Ser 1.13 (*) 0.57 - 1.00 mg/dL   GFR calc non Af Amer 45 (*) >59 mL/min/1.73   GFR calc Af Amer 52 (*) >59 mL/min/1.73   BUN/Creatinine Ratio 19  11 - 26   Sodium 132 (*) 134 - 144 mmol/L   Potassium 4.3  3.5 - 5.2 mmol/L   Chloride 92 (*) 97 - 108 mmol/L   CO2  24  18 - 29 mmol/L   Calcium 9.7  8.6 - 10.2 mg/dL   Total Protein 7.0  6.0 - 8.5 g/dL   Albumin 4.4  3.5 - 4.7 g/dL   Globulin, Total 2.6  1.5 - 4.5 g/dL   Albumin/Globulin Ratio 1.7  1.1 - 2.5   Total Bilirubin 0.4  0.0 - 1.2 mg/dL   Alkaline Phosphatase 75  39 - 117 IU/L   AST 20  0 - 40 IU/L   ALT 13  0 - 32 IU/L  TSH  Result Value Range   TSH 6.140 (*) 0.450 - 4.500 uIU/mL  NMR, LIPOPROFILE      Result Value Range   LDL Particle Number [id number] (*) <1000 nmol/L   LDLC SERPL CALC-MCNC 86  <100 mg/dL   HDL Cholesterol by NMR 58  >=40 mg/dL   Triglycerides by NMR 85  <150 mg/dL   Cholesterol 161  <200 mg/dL   HDL Particle Number 34.2  >=30.5 umol/L   Small LDL Particle Number 95  <=527 nmol/L   LDL Size 21.4  >20.5 nm   LP-IR Score < 25  <= 45  VITAMIN D 25 HYDROXY      Result Value Range   Vit D, 25-Hydroxy 28.9 (*) 30.0 - 100.0 ng/mL  POCT GLYCOSYLATED HEMOGLOBIN (HGB A1C)      Result Value Range   Hemoglobin A1C 4.8      ASSESSMENT:  Obesity, unspecified  HTN (hypertension) - Plan: terazosin (HYTRIN) 1 MG capsule, BMP8+EGFR  HLD (hyperlipidemia)  DM (diabetes mellitus)  Varicose veins  Unspecified vitamin D deficiency - Plan: Vit D  25 hydroxy (rtn osteoporosis monitoring)  Unspecified hypothyroidism - Plan: TSH  Seasonal allergic rhinitis  Genu valgum (acquired) Thyroid medication was recently adjusted and the Vit D was incraesed.  PLAN:  DASH diet discussed and handout in the AVS.  Orders Placed This Encounter  Procedures  . BMP8+EGFR  . TSH  . Vit D  25 hydroxy (rtn osteoporosis monitoring)   Meds ordered this encounter  Medications  . DISCONTD: diltiazem (CARDIZEM CD) 240 MG 24 hr capsule    Sig:   . terazosin (HYTRIN) 1 MG capsule    Sig: Take 1 capsule (1 mg total) by mouth at bedtime.    Dispense:  30 capsule    Refill:  1   Medications Discontinued During This Encounter  Medication Reason  . diltiazem (CARDIZEM CD) 240 MG 24 hr  capsule Duplicate   Return in about 4 weeks (around 01/26/2014) for recheck BP.  Maythe Deramo P. Jacelyn Grip, M.D.

## 2013-12-30 LAB — BMP8+EGFR
BUN/Creatinine Ratio: 24 (ref 11–26)
BUN: 26 mg/dL (ref 8–27)
CO2: 19 mmol/L (ref 18–29)
Calcium: 9.9 mg/dL (ref 8.7–10.3)
Chloride: 95 mmol/L — ABNORMAL LOW (ref 97–108)
Creatinine, Ser: 1.08 mg/dL — ABNORMAL HIGH (ref 0.57–1.00)
GFR calc Af Amer: 55 mL/min/{1.73_m2} — ABNORMAL LOW (ref >59)
GFR calc non Af Amer: 48 mL/min/{1.73_m2} — ABNORMAL LOW (ref >59)
Glucose: 100 mg/dL — ABNORMAL HIGH (ref 65–99)
Potassium: 4.5 mmol/L (ref 3.5–5.2)
Sodium: 134 mmol/L (ref 134–144)

## 2013-12-30 LAB — TSH: TSH: 3.73 u[IU]/mL (ref 0.450–4.500)

## 2013-12-30 LAB — VITAMIN D 25 HYDROXY (VIT D DEFICIENCY, FRACTURES): Vit D, 25-Hydroxy: 31.3 ng/mL (ref 30.0–100.0)

## 2014-01-13 ENCOUNTER — Other Ambulatory Visit: Payer: Self-pay | Admitting: *Deleted

## 2014-01-13 MED ORDER — DILTIAZEM HCL ER 240 MG PO CP24: ORAL_CAPSULE | ORAL | Status: DC

## 2014-01-26 ENCOUNTER — Ambulatory Visit: Payer: Medicare Other | Admitting: Family Medicine

## 2014-01-28 ENCOUNTER — Other Ambulatory Visit: Payer: Self-pay

## 2014-01-28 MED ORDER — CITALOPRAM HYDROBROMIDE 20 MG PO TABS: 20.0000 mg | ORAL_TABLET | Freq: Every day | ORAL | Status: DC

## 2014-02-03 ENCOUNTER — Other Ambulatory Visit: Payer: Self-pay | Admitting: *Deleted

## 2014-02-03 MED ORDER — MAGNESIUM OXIDE 400 (241.3 MG) MG PO TABS: 400.0000 mg | ORAL_TABLET | Freq: Every day | ORAL | Status: DC

## 2014-02-04 ENCOUNTER — Ambulatory Visit: Payer: Medicare Other | Admitting: Family Medicine

## 2014-02-08 ENCOUNTER — Telehealth: Payer: Self-pay | Admitting: Family Medicine

## 2014-02-08 ENCOUNTER — Other Ambulatory Visit: Payer: Self-pay | Admitting: Family Medicine

## 2014-02-08 MED ORDER — LOSARTAN POTASSIUM 100 MG PO TABS: 100.0000 mg | ORAL_TABLET | Freq: Every day | ORAL | Status: DC

## 2014-02-08 NOTE — Telephone Encounter (Signed)
Call patient : Prescription refilled & sent to pharmacy in [facility]. 

## 2014-02-08 NOTE — Telephone Encounter (Signed)
Sent in by RX request

## 2014-02-24 ENCOUNTER — Other Ambulatory Visit: Payer: Self-pay | Admitting: *Deleted

## 2014-02-24 DIAGNOSIS — I10 Essential (primary) hypertension: Secondary | ICD-10-CM

## 2014-02-24 MED ORDER — CHLORTHALIDONE 25 MG PO TABS: 12.5000 mg | ORAL_TABLET | Freq: Every day | ORAL | Status: DC

## 2014-02-24 NOTE — Telephone Encounter (Signed)
Call patient : Prescription refilled & sent to pharmacy in [facility]. 

## 2014-02-24 NOTE — Telephone Encounter (Signed)
Pt was suppose to return in Feb for B/P rck and didn't. Has appt 03/05/14. Hytrin was ordered 12/29/13. Please review. Thanks.

## 2014-03-05 ENCOUNTER — Ambulatory Visit (INDEPENDENT_AMBULATORY_CARE_PROVIDER_SITE_OTHER): Payer: Medicare Other | Admitting: Family Medicine

## 2014-03-05 ENCOUNTER — Encounter: Payer: Self-pay | Admitting: Family Medicine

## 2014-03-05 VITALS — BP 168/67 | HR 99 | Temp 97.1°F | Ht 64.0 in | Wt 203.8 lb

## 2014-03-05 DIAGNOSIS — I10 Essential (primary) hypertension: Secondary | ICD-10-CM | POA: Diagnosis not present

## 2014-03-05 DIAGNOSIS — E785 Hyperlipidemia, unspecified: Secondary | ICD-10-CM

## 2014-03-05 DIAGNOSIS — E119 Type 2 diabetes mellitus without complications: Secondary | ICD-10-CM | POA: Diagnosis not present

## 2014-03-05 DIAGNOSIS — E559 Vitamin D deficiency, unspecified: Secondary | ICD-10-CM

## 2014-03-05 DIAGNOSIS — E669 Obesity, unspecified: Secondary | ICD-10-CM

## 2014-03-05 DIAGNOSIS — E039 Hypothyroidism, unspecified: Secondary | ICD-10-CM | POA: Diagnosis not present

## 2014-03-05 DIAGNOSIS — M21069 Valgus deformity, not elsewhere classified, unspecified knee: Secondary | ICD-10-CM

## 2014-03-05 MED ORDER — DILTIAZEM HCL ER 180 MG PO CP24: 360.0000 mg | ORAL_CAPSULE | Freq: Every day | ORAL | Status: DC

## 2014-03-05 MED ORDER — CITALOPRAM HYDROBROMIDE 20 MG PO TABS: 20.0000 mg | ORAL_TABLET | Freq: Every day | ORAL | Status: DC

## 2014-03-05 NOTE — Patient Instructions (Signed)
DASH Diet  The DASH diet stands for "Dietary Approaches to Stop Hypertension." It is a healthy eating plan that has been shown to reduce high blood pressure (hypertension) in as little as 14 days, while also possibly providing other significant health benefits. These other health benefits include reducing the risk of breast cancer after menopause and reducing the risk of type 2 diabetes, heart disease, colon cancer, and stroke. Health benefits also include weight loss and slowing kidney failure in patients with chronic kidney disease.   DIET GUIDELINES  · Limit salt (sodium). Your diet should contain less than 1500 mg of sodium daily.  · Limit refined or processed carbohydrates. Your diet should include mostly whole grains. Desserts and added sugars should be used sparingly.  · Include small amounts of heart-healthy fats. These types of fats include nuts, oils, and tub margarine. Limit saturated and trans fats. These fats have been shown to be harmful in the body.  CHOOSING FOODS   The following food groups are based on a 2000 calorie diet. See your Registered Dietitian for individual calorie needs.  Grains and Grain Products (6 to 8 servings daily)  · Eat More Often: Whole-wheat bread, brown rice, whole-grain or wheat pasta, quinoa, popcorn without added fat or salt (air popped).  · Eat Less Often: White bread, white pasta, white rice, cornbread.  Vegetables (4 to 5 servings daily)  · Eat More Often: Fresh, frozen, and canned vegetables. Vegetables may be raw, steamed, roasted, or grilled with a minimal amount of fat.  · Eat Less Often/Avoid: Creamed or fried vegetables. Vegetables in a cheese sauce.  Fruit (4 to 5 servings daily)  · Eat More Often: All fresh, canned (in natural juice), or frozen fruits. Dried fruits without added sugar. One hundred percent fruit juice (½ cup [237 mL] daily).  · Eat Less Often: Dried fruits with added sugar. Canned fruit in light or heavy syrup.  Lean Meats, Fish, and Poultry (2  servings or less daily. One serving is 3 to 4 oz [85-114 g]).  · Eat More Often: Ninety percent or leaner ground beef, tenderloin, sirloin. Round cuts of beef, chicken breast, turkey breast. All fish. Grill, bake, or broil your meat. Nothing should be fried.  · Eat Less Often/Avoid: Fatty cuts of meat, turkey, or chicken leg, thigh, or wing. Fried cuts of meat or fish.  Dairy (2 to 3 servings)  · Eat More Often: Low-fat or fat-free milk, low-fat plain or light yogurt, reduced-fat or part-skim cheese.  · Eat Less Often/Avoid: Milk (whole, 2%). Whole milk yogurt. Full-fat cheeses.  Nuts, Seeds, and Legumes (4 to 5 servings per week)  · Eat More Often: All without added salt.  · Eat Less Often/Avoid: Salted nuts and seeds, canned beans with added salt.  Fats and Sweets (limited)  · Eat More Often: Vegetable oils, tub margarines without trans fats, sugar-free gelatin. Mayonnaise and salad dressings.  · Eat Less Often/Avoid: Coconut oils, palm oils, butter, stick margarine, cream, half and half, cookies, candy, pie.  FOR MORE INFORMATION  The Dash Diet Eating Plan: www.dashdiet.org  Document Released: 11/15/2011 Document Revised: 02/18/2012 Document Reviewed: 11/15/2011  [employer]® Patient Information ©2014 [employer].

## 2014-03-05 NOTE — Progress Notes (Signed)
Patient ID: Katherine Tyler, female   DOB: 04/23/1929, 80 y.o.   MRN: [id number] SUBJECTIVE: CC: Chief Complaint  Patient presents with  . Follow-up    HPI: Patient is here for follow up of hypertension: denies Headache;deniesChest Pain;denies weakness;denies Shortness of Breath or Orthopnea;denies Visual changes;denies palpitations;denies cough;denies pedal edema;denies symptoms of TIA or stroke; admits to Compliance with medications. denies Problems with medications. couldn't take the hytrin because it caused muscle and joint aches.  Past Medical History  Diagnosis Date  . Hyperlipidemia   . Hypertension   . Arthritis   . Hiatal hernia   . Anxiety   . GERD (gastroesophageal reflux disease)   . Vitamin D deficiency   . Thyroid disease   . Diabetes mellitus without complication   . Obesity    No past surgical history on file. History   Social History  . Marital Status: Widowed    Spouse Name: N/A    Number of Children: N/A  . Years of Education: N/A   Occupational History  . Not on file.   Social History Main Topics  . Smoking status: Never Smoker   . Smokeless tobacco: Not on file  . Alcohol Use: Not on file  . Drug Use: Not on file  . Sexual Activity: Not on file   Other Topics Concern  . Not on file   Social History Narrative  . No narrative on file   No family history on file. Current Outpatient Prescriptions on File Prior to Visit  Medication Sig Dispense Refill  . aspirin 81 MG tablet Take 81 mg by mouth daily.      . chlorthalidone (HYGROTON) 25 MG tablet Take 0.5 tablets (12.5 mg total) by mouth daily.  30 tablet  3  . Cholecalciferol (D3-1000) 1000 UNITS capsule Take 2,000 Units by mouth daily.       . Ferrous Sulfate Dried (SLOW RELEASE IRON) 45 MG TBCR Take 45 tablets by mouth daily.      . fluticasone (FLONASE) 50 MCG/ACT nasal spray PLACE 2 SPRAYS INTO THE NOSE DAILY.  16 g  3  . levothyroxine (SYNTHROID, LEVOTHROID) 125 MCG tablet TAKE 1 TABLET (112  MCG TOTAL) BY MOUTH DAILY.  30 tablet  6  . losartan (COZAAR) 100 MG tablet Take 1 tablet (100 mg total) by mouth daily.  30 tablet  4  . magnesium oxide (MAG-OX) 400 (241.3 MG) MG tablet Take 1 tablet (400 mg total) by mouth daily.  30 tablet  2   No current facility-administered medications on file prior to visit.   Allergies  Allergen Reactions  . Terazosin Hcl Other (See Comments)    Muscle aches  . Keflex [Cephalexin]    Immunization History  Administered Date(s) Administered  . Influenza,inj,Quad PF,36+ Mos 08/31/2013  . Pneumococcal Polysaccharide-23 12/10/1996   Prior to Admission medications   Medication Sig Start Date End Date Taking? Authorizing Provider  aspirin 81 MG tablet Take 81 mg by mouth daily.    Historical Provider, MD  chlorthalidone (HYGROTON) 25 MG tablet Take 0.5 tablets (12.5 mg total) by mouth daily. 02/24/14   Ileana Ladd, MD  Cholecalciferol (D3-1000) 1000 UNITS capsule Take 2,000 Units by mouth daily.     Historical Provider, MD  citalopram (CELEXA) 20 MG tablet Take 1 tablet (20 mg total) by mouth daily. 01/28/14   Ileana Ladd, MD  diltiazem (DILACOR XR) 240 MG 24 hr capsule TAKE 1 CAPSULE (240 MG TOTAL) BY MOUTH 2 (TWO) TIMES DAILY. 01/13/14  Ileana LaddFrancis P Cuthbert Turton, MD  Ferrous Sulfate Dried (SLOW RELEASE IRON) 45 MG TBCR Take 45 tablets by mouth daily.    Historical Provider, MD  fluticasone (FLONASE) 50 MCG/ACT nasal spray PLACE 2 SPRAYS INTO THE NOSE DAILY. 08/20/13   Ernestina Pennaonald W Moore, MD  levothyroxine (SYNTHROID, LEVOTHROID) 125 MCG tablet TAKE 1 TABLET (112 MCG TOTAL) BY MOUTH DAILY. 11/12/13   Ileana LaddFrancis P Fadumo Heng, MD  losartan (COZAAR) 100 MG tablet Take 1 tablet (100 mg total) by mouth daily. 02/08/14   Ileana LaddFrancis P Tryphena Perkovich, MD  magnesium oxide (MAG-OX) 400 (241.3 MG) MG tablet Take 1 tablet (400 mg total) by mouth daily. 02/03/14   Ileana LaddFrancis P Torsha Lemus, MD  terazosin (HYTRIN) 1 MG capsule Take 1 capsule (1 mg total) by mouth at bedtime. 12/29/13   Ileana LaddFrancis P Trayven Lumadue, MD      ROS: As above in the HPI. All other systems are stable or negative.  OBJECTIVE: APPEARANCE:  Patient in no acute distress.The patient appeared well nourished and normally developed. Acyanotic. Waist: VITAL SIGNS:BP 168/67  Pulse 99  Temp(Src) 97.1 F (36.2 C) (Oral)  Ht 5\' 4"  (1.626 m)  Wt 203 lb 12.8 oz (92.443 kg)  BMI 34.96 kg/m2 Obese WM Recheck BP 164/70  SKIN: warm and  Dry without overt rashes, tattoos and scars  HEAD and Neck: without JVD, Head and scalp: normal Eyes:No scleral icterus. Fundi normal, eye movements normal. Ears: Auricle normal, canal normal, Tympanic membranes normal, insufflation normal. Nose: normal Throat: normal Neck & thyroid: normal  CHEST & LUNGS: Chest wall: normal Lungs: Clear  CVS: Reveals the PMI to be normally located. Regular rhythm, First and Second Heart sounds are normal,  absence of murmurs, rubs or gallops. Peripheral vasculature: Radial pulses: normal Dorsal pedis pulses: normal Posterior pulses: normal  ABDOMEN:  Appearance: obese Benign, no organomegaly, no masses, no Abdominal Aortic enlargement. No Guarding , no rebound. No Bruits. Bowel sounds: normal  RECTAL: N/A GU: N/A  EXTREMETIES: nonedematous.  MUSCULOSKELETAL:  Spine: normal Joints: genu valgum Ambulates with a cane  NEUROLOGIC: oriented to time,place and person; nonfocal. Strength is normal Sensory is normal Reflexes are normal Cranial Nerves are normal.  ASSESSMENT:  HTN (hypertension)  Obesity, unspecified  HLD (hyperlipidemia)  DM (diabetes mellitus)  Unspecified hypothyroidism  Genu valgum (acquired)  Unspecified vitamin D deficiency  BP not at goal and patient had muscle and joint aches which is a very rare side effect of hytrin.   PLAN: Dash diet Will adjust the diltiazem dose. No orders of the defined types were placed in this encounter.   Meds ordered this encounter  Medications  . diltiazem (DILACOR XR) 180  MG 24 hr capsule    Sig: Take 2 capsules (360 mg total) by mouth daily.    Dispense:  60 capsule    Refill:  2  . citalopram (CELEXA) 20 MG tablet    Sig: Take 1 tablet (20 mg total) by mouth daily.    Dispense:  30 tablet    Refill:  6   Medications Discontinued During This Encounter  Medication Reason  . terazosin (HYTRIN) 1 MG capsule Side effect (s)  . diltiazem (DILACOR XR) 240 MG 24 hr capsule Reorder  . citalopram (CELEXA) 20 MG tablet Reorder   Return in about 4 weeks (around 04/02/2014) for recheck BP.  Shemika Robbs P. [place] CharonWong, M.D.

## 2014-04-05 ENCOUNTER — Encounter: Payer: Self-pay | Admitting: Family Medicine

## 2014-04-05 ENCOUNTER — Ambulatory Visit (INDEPENDENT_AMBULATORY_CARE_PROVIDER_SITE_OTHER): Payer: Medicare Other | Admitting: Family Medicine

## 2014-04-05 VITALS — BP 186/80 | HR 98 | Temp 97.6°F | Ht 64.0 in | Wt 209.8 lb

## 2014-04-05 DIAGNOSIS — I8390 Asymptomatic varicose veins of unspecified lower extremity: Secondary | ICD-10-CM

## 2014-04-05 DIAGNOSIS — E559 Vitamin D deficiency, unspecified: Secondary | ICD-10-CM | POA: Diagnosis not present

## 2014-04-05 DIAGNOSIS — E039 Hypothyroidism, unspecified: Secondary | ICD-10-CM

## 2014-04-05 DIAGNOSIS — M21069 Valgus deformity, not elsewhere classified, unspecified knee: Secondary | ICD-10-CM

## 2014-04-05 DIAGNOSIS — I10 Essential (primary) hypertension: Secondary | ICD-10-CM | POA: Diagnosis not present

## 2014-04-05 DIAGNOSIS — E785 Hyperlipidemia, unspecified: Secondary | ICD-10-CM | POA: Diagnosis not present

## 2014-04-05 DIAGNOSIS — E669 Obesity, unspecified: Secondary | ICD-10-CM

## 2014-04-05 DIAGNOSIS — E119 Type 2 diabetes mellitus without complications: Secondary | ICD-10-CM

## 2014-04-05 DIAGNOSIS — J302 Other seasonal allergic rhinitis: Secondary | ICD-10-CM

## 2014-04-05 DIAGNOSIS — J309 Allergic rhinitis, unspecified: Secondary | ICD-10-CM

## 2014-04-05 LAB — POCT GLYCOSYLATED HEMOGLOBIN (HGB A1C): Hemoglobin A1C: 5

## 2014-04-05 MED ORDER — DILTIAZEM HCL ER 180 MG PO CP24: 360.0000 mg | ORAL_CAPSULE | Freq: Every day | ORAL | Status: DC

## 2014-04-05 MED ORDER — LOSARTAN POTASSIUM 100 MG PO TABS: 100.0000 mg | ORAL_TABLET | Freq: Every day | ORAL | Status: DC

## 2014-04-05 MED ORDER — MAGNESIUM OXIDE 400 (241.3 MG) MG PO TABS: 400.0000 mg | ORAL_TABLET | Freq: Every day | ORAL | Status: DC

## 2014-04-05 MED ORDER — CHLORTHALIDONE 25 MG PO TABS: 12.5000 mg | ORAL_TABLET | Freq: Every day | ORAL | Status: DC

## 2014-04-05 MED ORDER — CITALOPRAM HYDROBROMIDE 20 MG PO TABS: 20.0000 mg | ORAL_TABLET | Freq: Every day | ORAL | Status: DC

## 2014-04-05 MED ORDER — LEVOTHYROXINE SODIUM 125 MCG PO TABS: ORAL_TABLET | ORAL | Status: DC

## 2014-04-05 MED ORDER — CLONIDINE HCL 0.1 MG PO TABS: 0.1000 mg | ORAL_TABLET | Freq: Every day | ORAL | Status: DC

## 2014-04-05 NOTE — Addendum Note (Signed)
Addended by: Prescott GumLAND, Shanece Cochrane M on: 04/05/2014 11:54 AM   Modules accepted: Orders

## 2014-04-05 NOTE — Patient Instructions (Signed)
DASH Diet The DASH diet stands for "Dietary Approaches to Stop Hypertension." It is a healthy eating plan that has been shown to reduce high blood pressure (hypertension) in as little as 14 days, while also possibly providing other significant health benefits. These other health benefits include reducing the risk of breast cancer after menopause and reducing the risk of type 2 diabetes, heart disease, colon cancer, and stroke. Health benefits also include weight loss and slowing kidney failure in patients with chronic kidney disease.  DIET GUIDELINES  Limit salt (sodium). Your diet should contain less than 1500 mg of sodium daily.  Limit refined or processed carbohydrates. Your diet should include mostly whole grains. Desserts and added sugars should be used sparingly.  Include small amounts of heart-healthy fats. These types of fats include nuts, oils, and tub margarine. Limit saturated and trans fats. These fats have been shown to be harmful in the body. CHOOSING FOODS  The following food groups are based on a 2000 calorie diet. See your Registered Dietitian for individual calorie needs. Grains and Grain Products (6 to 8 servings daily)  Eat More Often: Whole-wheat bread, brown rice, whole-grain or wheat pasta, quinoa, popcorn without added fat or salt (air popped).  Eat Less Often: White bread, white pasta, white rice, cornbread. Vegetables (4 to 5 servings daily)  Eat More Often: Fresh, frozen, and canned vegetables. Vegetables may be raw, steamed, roasted, or grilled with a minimal amount of fat.  Eat Less Often/Avoid: Creamed or fried vegetables. Vegetables in a cheese sauce. Fruit (4 to 5 servings daily)  Eat More Often: All fresh, canned (in natural juice), or frozen fruits. Dried fruits without added sugar. One hundred percent fruit juice ( cup [237 mL] daily).  Eat Less Often: Dried fruits with added sugar. Canned fruit in light or heavy syrup. Foot LockerLean Meats, Fish, and Poultry (2  servings or less daily. One serving is 3 to 4 oz [85-114 g]).  Eat More Often: Ninety percent or leaner ground beef, tenderloin, sirloin. Round cuts of beef, chicken breast, [place]key breast. All fish. Grill, bake, or broil your meat. Nothing should be fried.  Eat Less Often/Avoid: Fatty cuts of meat, Mal[place]key, or chicken leg, thigh, or wing. Fried cuts of meat or fish. Dairy (2 to 3 servings)  Eat More Often: Low-fat or fat-free milk, low-fat plain or light yogurt, reduced-fat or part-skim cheese.  Eat Less Often/Avoid: Milk (whole, 2%).Whole milk yogurt. Full-fat cheeses. Nuts, Seeds, and Legumes (4 to 5 servings per week)  Eat More Often: All without added salt.  Eat Less Often/Avoid: Salted nuts and seeds, canned beans with added salt. Fats and Sweets (limited)  Eat More Often: Vegetable oils, tub margarines without trans fats, sugar-free gelatin. Mayonnaise and salad dressings.  Eat Less Often/Avoid: Coconut oils, palm oils, butter, stick margarine, cream, half and half, cookies, candy, pie. FOR MORE INFORMATION The Dash Diet Eating Plan: www.dashdiet.org Document Released: 11/15/2011 Document Revised: 02/18/2012 Document Reviewed: 11/15/2011 [facility] Patient Information 2014 [place], [place].    Bring BP readings

## 2014-04-05 NOTE — Progress Notes (Addendum)
Patient ID: Katherine Tyler, female   DOB: Mar 02, 1929, 80 y.o.   MRN: [id number] SUBJECTIVE: CC: Chief Complaint  Patient presents with  . Follow-up    4 week follow up  BP  states wants refills on alll meds    HPI: Patient is here for follow up of hypertension: denies Headache;deniesChest Pain;denies weakness;denies Shortness of Breath or Orthopnea;denies Visual changes;denies palpitations;denies cough;denies pedal edema;denies symptoms of TIA or stroke; admits to Compliance with medications. denies Problems with medications. Drank strong black coffee prior to coming. Thinks her BPs are good at home and coming here raises her BP. She has a machine but doesn't check her BPs.   Past Medical History  Diagnosis Date  . Hyperlipidemia   . Hypertension   . Arthritis   . Hiatal hernia   . Anxiety   . GERD (gastroesophageal reflux disease)   . Vitamin D deficiency   . Thyroid disease   . Diabetes mellitus without complication   . Obesity    No past surgical history on file. History   Social History  . Marital Status: Widowed    Spouse Name: N/A    Number of Children: N/A  . Years of Education: N/A   Occupational History  . Not on file.   Social History Main Topics  . Smoking status: Never Smoker   . Smokeless tobacco: Not on file  . Alcohol Use: Not on file  . Drug Use: Not on file  . Sexual Activity: Not on file   Other Topics Concern  . Not on file   Social History Narrative  . No narrative on file   No family history on file. Current Outpatient Prescriptions on File Prior to Visit  Medication Sig Dispense Refill  . aspirin 81 MG tablet Take 81 mg by mouth daily.      . Cholecalciferol (D3-1000) 1000 UNITS capsule Take 2,000 Units by mouth daily.       . Ferrous Sulfate Dried (SLOW RELEASE IRON) 45 MG TBCR Take 45 tablets by mouth daily.      . fluticasone (FLONASE) 50 MCG/ACT nasal spray PLACE 2 SPRAYS INTO THE NOSE DAILY.  16 g  3   No current facility-administered  medications on file prior to visit.   Allergies  Allergen Reactions  . Terazosin Hcl Other (See Comments)    Muscle aches  . Keflex [Cephalexin]    Immunization History  Administered Date(s) Administered  . Influenza,inj,Quad PF,36+ Mos 08/31/2013  . Pneumococcal Polysaccharide-23 12/10/1996   Prior to Admission medications   Medication Sig Start Date End Date Taking? Authorizing Provider  aspirin 81 MG tablet Take 81 mg by mouth daily.   Yes Historical Provider, MD  chlorthalidone (HYGROTON) 25 MG tablet Take 0.5 tablets (12.5 mg total) by mouth daily. 02/24/14  Yes Vernie Shanks, MD  Cholecalciferol (D3-1000) 1000 UNITS capsule Take 2,000 Units by mouth daily.    Yes Historical Provider, MD  citalopram (CELEXA) 20 MG tablet Take 1 tablet (20 mg total) by mouth daily. 03/05/14  Yes Vernie Shanks, MD  diltiazem (DILACOR XR) 180 MG 24 hr capsule Take 2 capsules (360 mg total) by mouth daily. 03/05/14  Yes Vernie Shanks, MD  levothyroxine (SYNTHROID, LEVOTHROID) 125 MCG tablet TAKE 1 TABLET (112 MCG TOTAL) BY MOUTH DAILY. 11/12/13  Yes Vernie Shanks, MD  losartan (COZAAR) 100 MG tablet Take 1 tablet (100 mg total) by mouth daily. 02/08/14  Yes Vernie Shanks, MD  magnesium oxide (MAG-OX) 400 (241.3  MG) MG tablet Take 1 tablet (400 mg total) by mouth daily. 02/03/14  Yes Vernie Shanks, MD  Ferrous Sulfate Dried (SLOW RELEASE IRON) 45 MG TBCR Take 45 tablets by mouth daily.    Historical Provider, MD  fluticasone (FLONASE) 50 MCG/ACT nasal spray PLACE 2 SPRAYS INTO THE NOSE DAILY. 08/20/13   Chipper Herb, MD     ROS: As above in the HPI. All other systems are stable or negative.  OBJECTIVE: APPEARANCE:  Patient in no acute distress.The patient appeared well nourished and normally developed. Acyanotic. Waist: VITAL SIGNS:BP 186/80  Pulse 98  Temp(Src) 97.6 F (36.4 C) (Oral)  Ht 5' 4"  (1.626 m)  Wt 209 lb 12.8 oz (95.165 kg)  BMI 35.99 kg/m2  Recheck BP 180/80 WF Obese  SKIN:  warm and  Dry without overt rashes, tattoos and scars  HEAD and Neck: without JVD, Head and scalp: normal Eyes:No scleral icterus. Fundi normal, eye movements normal. Ears: Auricle normal, canal normal, Tympanic membranes normal, insufflation normal. Nose: normal Throat: normal Neck & thyroid: normal  CHEST & LUNGS: Chest wall: normal Lungs: Clear  CVS: Reveals the PMI to be normally located. Regular rhythm, First and Second Heart sounds are normal,  absence of murmurs, rubs or gallops. Peripheral vasculature: Radial pulses: normal Dorsal pedis pulses: normal Posterior pulses: normal  ABDOMEN:  Appearance: obese Benign, no organomegaly, no masses, no Abdominal Aortic enlargement. No Guarding , no rebound. No Bruits. Bowel sounds: normal  RECTAL: N/A GU: N/A  EXTREMETIES: nonedematous.  MUSCULOSKELETAL:  Spine: normal Joints: right Genu valgus. Crepitus of the right knee. Ambulates with a cane  NEUROLOGIC: oriented to time,place and person; nonfocal. Cranial Nerves are normal.   Results for orders placed in visit on 12/29/13  BMP8+EGFR      Result Value Ref Range   Glucose 100 (*) 65 - 99 mg/dL   BUN 26  8 - 27 mg/dL   Creatinine, Ser 1.08 (*) 0.57 - 1.00 mg/dL   GFR calc non Af Amer 48 (*) >59 mL/min/1.73   GFR calc Af Amer 55 (*) >59 mL/min/1.73   BUN/Creatinine Ratio 24  11 - 26   Sodium 134  134 - 144 mmol/L   Potassium 4.5  3.5 - 5.2 mmol/L   Chloride 95 (*) 97 - 108 mmol/L   CO2 19  18 - 29 mmol/L   Calcium 9.9  8.7 - 10.3 mg/dL  TSH      Result Value Ref Range   TSH 3.730  0.450 - 4.500 uIU/mL  VITAMIN D 25 HYDROXY      Result Value Ref Range   Vit D, 25-Hydroxy 31.3  30.0 - 100.0 ng/mL    ASSESSMENT: Obesity, unspecified  HTN (hypertension) - Plan: chlorthalidone (HYGROTON) 25 MG tablet, citalopram (CELEXA) 20 MG tablet, diltiazem (DILACOR XR) 180 MG 24 hr capsule, losartan (COZAAR) 100 MG tablet, cloNIDine (CATAPRES) 0.1 MG tablet,  CMP14+EGFR  HLD (hyperlipidemia) - Plan: NMR, lipoprofile  DM (diabetes mellitus) - Plan: cloNIDine (CATAPRES) 0.1 MG tablet, POCT UA - Microalbumin, POCT glycosylated hemoglobin (Hb A1C)  Hypothyroid - Plan: levothyroxine (SYNTHROID, LEVOTHROID) 125 MCG tablet, TSH  Unspecified vitamin D deficiency - Plan: Vit D  25 hydroxy (rtn osteoporosis monitoring)  Seasonal allergic rhinitis  Genu valgum (acquired)  Unspecified hypothyroidism  Varicose veins BP not at goal.  PLAN:  Added clonidine Dash diet in the AVS.. Discussed need for weight loss to improve her health status.    Orders Placed This Encounter  Procedures  . CMP14+EGFR  . NMR, lipoprofile  . TSH  . Vit D  25 hydroxy (rtn osteoporosis monitoring)  . POCT UA - Microalbumin  . POCT glycosylated hemoglobin (Hb A1C)   Meds ordered this encounter  Medications  . DISCONTD: diltiazem (CARDIZEM CD) 180 MG 24 hr capsule    Sig:   . chlorthalidone (HYGROTON) 25 MG tablet    Sig: Take 0.5 tablets (12.5 mg total) by mouth daily.    Dispense:  30 tablet    Refill:  3  . citalopram (CELEXA) 20 MG tablet    Sig: Take 1 tablet (20 mg total) by mouth daily.    Dispense:  30 tablet    Refill:  6  . diltiazem (DILACOR XR) 180 MG 24 hr capsule    Sig: Take 2 capsules (360 mg total) by mouth daily.    Dispense:  60 capsule    Refill:  2  . losartan (COZAAR) 100 MG tablet    Sig: Take 1 tablet (100 mg total) by mouth daily.    Dispense:  30 tablet    Refill:  4  . levothyroxine (SYNTHROID, LEVOTHROID) 125 MCG tablet    Sig: TAKE 1 TABLET (112 MCG TOTAL) BY MOUTH DAILY.    Dispense:  30 tablet    Refill:  6  . magnesium oxide (MAG-OX) 400 (241.3 MG) MG tablet    Sig: Take 1 tablet (400 mg total) by mouth daily.    Dispense:  30 tablet    Refill:  2  . cloNIDine (CATAPRES) 0.1 MG tablet    Sig: Take 1 tablet (0.1 mg total) by mouth daily.    Dispense:  30 tablet    Refill:  3   Medications Discontinued During This  Encounter  Medication Reason  . diltiazem (CARDIZEM CD) 180 MG 24 hr capsule Duplicate  . chlorthalidone (HYGROTON) 25 MG tablet Reorder  . citalopram (CELEXA) 20 MG tablet Reorder  . diltiazem (DILACOR XR) 180 MG 24 hr capsule Reorder  . losartan (COZAAR) 100 MG tablet Reorder  . levothyroxine (SYNTHROID, LEVOTHROID) 125 MCG tablet Reorder  . magnesium oxide (MAG-OX) 400 (241.3 MG) MG tablet Reorder   Return in about 2 weeks (around 04/19/2014) for recheck BP.  Keshun Berrett P. Jacelyn Grip, M.D.

## 2014-04-06 LAB — CMP14+EGFR
ALT: 16 IU/L (ref 0–32)
AST: 19 IU/L (ref 0–40)
Albumin/Globulin Ratio: 1.8 (ref 1.1–2.5)
Albumin: 4.4 g/dL (ref 3.5–4.7)
Alkaline Phosphatase: 62 IU/L (ref 39–117)
BUN/Creatinine Ratio: 24 (ref 11–26)
BUN: 26 mg/dL (ref 8–27)
CO2: 24 mmol/L (ref 18–29)
Calcium: 9.7 mg/dL (ref 8.7–10.3)
Chloride: 96 mmol/L — ABNORMAL LOW (ref 97–108)
Creatinine, Ser: 1.1 mg/dL — ABNORMAL HIGH (ref 0.57–1.00)
GFR calc Af Amer: 54 mL/min/{1.73_m2} — ABNORMAL LOW (ref >59)
GFR calc non Af Amer: 47 mL/min/{1.73_m2} — ABNORMAL LOW (ref >59)
Globulin, Total: 2.5 g/dL (ref 1.5–4.5)
Glucose: 96 mg/dL (ref 65–99)
Potassium: 4.8 mmol/L (ref 3.5–5.2)
Sodium: 135 mmol/L (ref 134–144)
Total Bilirubin: 0.2 mg/dL (ref 0.0–1.2)
Total Protein: 6.9 g/dL (ref 6.0–8.5)

## 2014-04-06 LAB — VITAMIN D 25 HYDROXY (VIT D DEFICIENCY, FRACTURES): Vit D, 25-Hydroxy: 28.7 ng/mL — ABNORMAL LOW (ref 30.0–100.0)

## 2014-04-06 LAB — NMR, LIPOPROFILE
Cholesterol: 154 mg/dL (ref <200)
HDL Cholesterol by NMR: 59 mg/dL (ref >=40)
HDL Particle Number: 27.2 umol/L — ABNORMAL LOW (ref >=30.5)
LDL Particle Number: 915 nmol/L (ref <1000)
LDL Size: 21.3 nm (ref >20.5)
LDLC SERPL CALC-MCNC: 76 mg/dL (ref <100)
LP-IR Score: <25 (ref <=45)
Small LDL Particle Number: 238 nmol/L (ref <=527)
Triglycerides by NMR: 93 mg/dL (ref <150)

## 2014-04-06 LAB — TSH: TSH: 4.15 u[IU]/mL (ref 0.450–4.500)

## 2014-04-19 ENCOUNTER — Encounter: Payer: Self-pay | Admitting: Family

## 2014-04-19 ENCOUNTER — Ambulatory Visit (INDEPENDENT_AMBULATORY_CARE_PROVIDER_SITE_OTHER): Payer: Medicare Other | Admitting: Family

## 2014-04-19 VITALS — BP 176/79 | HR 87 | Temp 98.6°F | Ht 64.0 in | Wt 209.0 lb

## 2014-04-19 DIAGNOSIS — I10 Essential (primary) hypertension: Secondary | ICD-10-CM | POA: Diagnosis not present

## 2014-04-19 MED ORDER — LOSARTAN POTASSIUM-HCTZ 100-12.5 MG PO TABS: 1.0000 | ORAL_TABLET | Freq: Every day | ORAL | Status: DC

## 2014-04-19 NOTE — Patient Instructions (Signed)

## 2014-04-19 NOTE — Progress Notes (Signed)
   Subjective:    Patient ID: Katherine Tyler, female    DOB: 06/15/1929, 80 y.o.   MRN: [id number]  HPI Pt here for follow-up for HTN. Pt was seen in office two weeks ago and was started on Clonidine 0.1mg  daily.  Pt states she her BP at home has been avg 170s/70s.  Pt denies any headache, pain, palpations, SOB or dizziness. States "I feel better".    Review of Systems  HENT: Negative.   Respiratory: Negative.   Cardiovascular: Negative.   Gastrointestinal: Negative.   Genitourinary: Negative.   Musculoskeletal: Negative.   All other systems reviewed and are negative.      Objective:   Physical Exam  Vitals reviewed. Constitutional: She is oriented to person, place, and time. She appears well-developed and well-nourished. No distress.  Cardiovascular: Normal rate, regular rhythm, normal heart sounds and intact distal pulses.   No murmur heard. Pulmonary/Chest: Effort normal and breath sounds normal. No respiratory distress. She has no wheezes.  Abdominal: Soft. Bowel sounds are normal. She exhibits no distension. There is no tenderness.  Musculoskeletal: Normal range of motion. She exhibits no edema and no tenderness.  Neurological: She is alert and oriented to person, place, and time.  Skin: Skin is warm and dry.  Psychiatric: She has a normal mood and affect. Her behavior is normal. Judgment and thought content normal.     BP 176/79  Pulse 87  Temp(Src) 98.6 F (37 C) (Oral)  Ht 5\' 4"  (1.626 m)  Wt 209 lb (94.802 kg)  BMI 35.86 kg/m2      Assessment & Plan:  1. Hypertension -Daily blood pressure log given with instructions on how to fill out and told to bring to next visit -Exercise encouraged - Stress Management  -Continue Clonidine and stop losartan -RTO in 2 weeks Meds ordered this encounter  Medications  . losartan-hydrochlorothiazide (HYZAAR) 100-12.5 MG per tablet    Sig: Take 1 tablet by mouth daily.    Dispense:  30 tablet    Refill:  3    Order Specific  Question:  Supervising Provider    Answer:  Ernestina PennaMOORE, DONALD W [[id number]]     Jannifer Rodneyhristy Maylon Sailors, FNP

## 2014-05-05 ENCOUNTER — Encounter: Payer: Self-pay | Admitting: Family

## 2014-05-05 ENCOUNTER — Ambulatory Visit (INDEPENDENT_AMBULATORY_CARE_PROVIDER_SITE_OTHER): Payer: Medicare Other | Admitting: Family

## 2014-05-05 VITALS — BP 165/80 | HR 80 | Temp 99.7°F | Ht 64.0 in | Wt 209.0 lb

## 2014-05-05 DIAGNOSIS — I10 Essential (primary) hypertension: Secondary | ICD-10-CM

## 2014-05-05 MED ORDER — MAGNESIUM OXIDE 400 (241.3 MG) MG PO TABS: 400.0000 mg | ORAL_TABLET | Freq: Every day | ORAL | Status: DC

## 2014-05-05 MED ORDER — LOSARTAN POTASSIUM-HCTZ 100-25 MG PO TABS: 1.0000 | ORAL_TABLET | Freq: Every day | ORAL | Status: DC

## 2014-05-05 NOTE — Progress Notes (Signed)
   Subjective:    Patient ID: Katherine Tyler, female    DOB: 09-17-1929, 80 y.o.   MRN: [id number]  Hypertension   Pt here for follow-up on her blood pressure. Pt was seen on 5/11 and started on Hyzaar 100-12.5. Pt brings her blood pressure log that was given to her with the avg 130s/70s. Pt denies any headache, palpations, or SOB.  Review of Systems  HENT: Negative.   Respiratory: Negative.   Cardiovascular: Negative.   Genitourinary: Negative.   Musculoskeletal: Negative.   Neurological: Negative.   Psychiatric/Behavioral: Negative.   All other systems reviewed and are negative.      Objective:   Physical Exam  Vitals reviewed. Constitutional: She is oriented to person, place, and time. She appears well-developed and well-nourished.  Cardiovascular: Normal rate, regular rhythm, normal heart sounds and intact distal pulses.   No murmur heard. Pulmonary/Chest: Effort normal and breath sounds normal. No respiratory distress. She has no wheezes.  Abdominal: Soft. Bowel sounds are normal. She exhibits no distension. There is no tenderness.  Musculoskeletal: Normal range of motion. She exhibits no edema and no tenderness.  Neurological: She is alert and oriented to person, place, and time.  Skin: Skin is warm and dry.  Psychiatric: She has a normal mood and affect. Her behavior is normal. Judgment and thought content normal.    BP 165/80  Pulse 80  Temp(Src) 99.7 F (37.6 C) (Oral)  Ht 5\' 4"  (1.626 m)  Wt 209 lb (94.802 kg)  BMI 35.86 kg/m2       Assessment & Plan:  1. HTN (hypertension) -Daily blood pressure log given with instructions on how to fill out and told to bring to next visit -Dash diet information given -Exercise encouraged - Stress Management  -Continue current meds, (Hyzaar increased to 100-25) -RTO in 2 weeks - losartan-hydrochlorothiazide (HYZAAR) 100-25 MG per tablet; Take 1 tablet by mouth daily.  Dispense: 90 tablet; Refill: 3  Jannifer Rodney, FNP

## 2014-05-05 NOTE — Patient Instructions (Signed)

## 2014-05-19 ENCOUNTER — Encounter: Payer: Self-pay | Admitting: Family

## 2014-05-19 ENCOUNTER — Ambulatory Visit (INDEPENDENT_AMBULATORY_CARE_PROVIDER_SITE_OTHER): Payer: Medicare Other | Admitting: Family

## 2014-05-19 VITALS — BP 178/82 | HR 73 | Temp 97.6°F | Ht 64.0 in | Wt 211.0 lb

## 2014-05-19 DIAGNOSIS — I10 Essential (primary) hypertension: Secondary | ICD-10-CM | POA: Diagnosis not present

## 2014-05-19 MED ORDER — METOPROLOL SUCCINATE ER 50 MG PO TB24: 50.0000 mg | ORAL_TABLET | Freq: Every day | ORAL | Status: DC

## 2014-05-19 NOTE — Progress Notes (Signed)
   Subjective:    Patient ID: Katherine Tyler, female    DOB: Dec 25, 1928, 80 y.o.   MRN: [id number]  Hypertension   Pt here for follow-up for hypertension. Pt currently taking clonidine 0.1 mg daily, Hyzaar 100-25mg  daily, and dilitiazem 180 mg daily. Pt brought in blood pressure log from home and average reading is 140s/70s.  Pt denies any headaches, palpations, or SOB.    Review of Systems  HENT: Negative.   Respiratory: Negative.   Cardiovascular: Negative.   Genitourinary: Negative.   Musculoskeletal: Negative.   Neurological: Negative.   Hematological: Negative.   Psychiatric/Behavioral: Negative.   All other systems reviewed and are negative.      Objective:   Physical Exam  Vitals reviewed. Constitutional: She is oriented to person, place, and time. She appears well-developed and well-nourished. No distress.  Cardiovascular: Normal rate, regular rhythm, normal heart sounds and intact distal pulses.   No murmur heard. Pulmonary/Chest: Effort normal and breath sounds normal. No respiratory distress. She has no wheezes.  Abdominal: Soft. Bowel sounds are normal. She exhibits no distension. There is no tenderness.  Musculoskeletal: Normal range of motion. She exhibits no edema and no tenderness.  Neurological: She is alert and oriented to person, place, and time.  Skin: Skin is warm and dry.  Psychiatric: She has a normal mood and affect. Her behavior is normal. Judgment and thought content normal.    BP 178/82  Pulse 73  Temp(Src) 97.6 F (36.4 C) (Oral)  Ht 5\' 4"  (1.626 m)  Wt 211 lb (95.709 kg)  BMI 36.20 kg/m2       Assessment & Plan:  1. HTN (hypertension) -Daily blood pressure log given with instructions on how to fill out and told to bring to next visit -Dash diet information given -Exercise encouraged - Stress Management  -Continue current meds -RTO in 2 weeks-If not better will refer to nephrologist - metoprolol succinate (TOPROL-XL) 50 MG 24 hr tablet;  Take 1 tablet (50 mg total) by mouth daily. Take with or immediately following a meal.  Dispense: 90 tablet; Refill: 3 - Basic Metabolic Panel  Jannifer Rodney, FNP

## 2014-05-19 NOTE — Patient Instructions (Signed)
Hypertension  As your heart beats, it forces blood through your arteries. This force is your blood pressure. If the pressure is too high, it is called hypertension (HTN) or high blood pressure. HTN is dangerous because you may have it and not know it. High blood pressure may mean that your heart has to work harder to pump blood. Your arteries may be narrow or stiff. The extra work puts you at risk for heart disease, stroke, and other problems.   Blood pressure consists of two numbers, a higher number over a lower, 110/72, for example. It is stated as "110 over 72." The ideal is below 120 for the top number (systolic) and under 80 for the bottom (diastolic). Write down your blood pressure today.  You should pay close attention to your blood pressure if you have certain conditions such as:   Heart failure.   Prior heart attack.   Diabetes   Chronic kidney disease.   Prior stroke.   Multiple risk factors for heart disease.  To see if you have HTN, your blood pressure should be measured while you are seated with your arm held at the level of the heart. It should be measured at least twice. A one-time elevated blood pressure reading (especially in the Emergency Department) does not mean that you need treatment. There may be conditions in which the blood pressure is different between your right and left arms. It is important to see your caregiver soon for a recheck.  Most people have essential hypertension which means that there is not a specific cause. This type of high blood pressure may be lowered by changing lifestyle factors such as:   Stress.   Smoking.   Lack of exercise.   Excessive weight.   Drug/tobacco/alcohol use.   Eating less salt.  Most people do not have symptoms from high blood pressure until it has caused damage to the body. Effective treatment can often prevent, delay or reduce that damage.  TREATMENT    When a cause has been identified, treatment for high blood pressure is directed at the cause. There are a large number of medications to treat HTN. These fall into several categories, and your caregiver will help you select the medicines that are best for you. Medications may have side effects. You should review side effects with your caregiver.  If your blood pressure stays high after you have made lifestyle changes or started on medicines,    Your medication(s) may need to be changed.   Other problems may need to be addressed.   Be certain you understand your prescriptions, and know how and when to take your medicine.   Be sure to follow up with your caregiver within the time frame advised (usually within two weeks) to have your blood pressure rechecked and to review your medications.   If you are taking more than one medicine to lower your blood pressure, make sure you know how and at what times they should be taken. Taking two medicines at the same time can result in blood pressure that is too low.  SEEK IMMEDIATE MEDICAL CARE IF:   You develop a severe headache, blurred or changing vision, or confusion.   You have unusual weakness or numbness, or a faint feeling.   You have severe chest or abdominal pain, vomiting, or breathing problems.  MAKE SURE YOU:    Understand these instructions.   Will watch your condition.   Will get help right away if you are not doing well   or get worse.  Document Released: 11/26/2005 Document Revised: 02/18/2012 Document Reviewed: 07/16/2008  Exit[employer] Patient Information 2014 [employer]  DASH Diet   The DASH diet stands for "Dietary Approaches to Stop Hypertension." It is a healthy eating plan that has been shown to reduce high blood pressure (hypertension) in as little as 14 days, while also possibly providing other significant health benefits. These other health benefits include reducing the risk of breast cancer after menopause and reducing the risk of type 2 diabetes, heart disease, colon cancer, and stroke. Health benefits also include weight loss and slowing kidney failure in patients with chronic kidney disease.   DIET GUIDELINES   Limit salt (sodium). Your diet should contain less than 1500 mg of sodium daily.   Limit refined or processed carbohydrates. Your diet should include mostly whole grains. Desserts and added sugars should be used sparingly.   Include small amounts of heart-healthy fats. These types of fats include nuts, oils, and tub margarine. Limit saturated and trans fats. These fats have been shown to be harmful in the body.  CHOOSING FOODS   The following food groups are based on a 2000 calorie diet. See your Registered Dietitian for individual calorie needs.  Grains and Grain Products (6 to 8 servings daily)   Eat More Often: Whole-wheat bread, brown rice, whole-grain or wheat pasta, quinoa, popcorn without added fat or salt (air popped).   Eat Less Often: White bread, white pasta, white rice, cornbread.  Vegetables (4 to 5 servings daily)   Eat More Often: Fresh, frozen, and canned vegetables. Vegetables may be raw, steamed, roasted, or grilled with a minimal amount of fat.   Eat Less Often/Avoid: Creamed or fried vegetables. Vegetables in a cheese sauce.  Fruit (4 to 5 servings daily)   Eat More Often: All fresh, canned (in natural juice), or frozen fruits. Dried fruits without added sugar. One hundred percent fruit juice ( cup [237 mL] daily).   Eat Less Often: Dried fruits with added sugar. Canned fruit in light or heavy syrup.   Lean Meats, Fish, and Poultry (2 servings or less daily. One serving is 3 to 4 oz [85-114 g]).   Eat More Often: Ninety percent or leaner ground beef, tenderloin, sirloin. Round cuts of beef, chicken breast, turkey breast. All fish. Grill, bake, or broil your meat. Nothing should be fried.   Eat Less Often/Avoid: Fatty cuts of meat, turkey, or chicken leg, thigh, or wing. Fried cuts of meat or fish.  Dairy (2 to 3 servings)   Eat More Often: Low-fat or fat-free milk, low-fat plain or light yogurt, reduced-fat or part-skim cheese.   Eat Less Often/Avoid: Milk (whole, 2%).Whole milk yogurt. Full-fat cheeses.  Nuts, Seeds, and Legumes (4 to 5 servings per week)   Eat More Often: All without added salt.   Eat Less Often/Avoid: Salted nuts and seeds, canned beans with added salt.  Fats and Sweets (limited)   Eat More Often: Vegetable oils, tub margarines without trans fats, sugar-free gelatin. Mayonnaise and salad dressings.   Eat Less Often/Avoid: Coconut oils, palm oils, butter, stick margarine, cream, half and half, cookies, candy, pie.  FOR MORE INFORMATION  The Dash Diet Eating Plan: www.dashdiet.org  Document Released: 11/15/2011 Document Revised: 02/18/2012 Document Reviewed: 11/15/2011  [employer] Patient Information 2014 [employer]

## 2014-06-04 ENCOUNTER — Encounter: Payer: Self-pay | Admitting: Family

## 2014-06-04 ENCOUNTER — Ambulatory Visit (INDEPENDENT_AMBULATORY_CARE_PROVIDER_SITE_OTHER): Payer: Medicare Other | Admitting: Family

## 2014-06-04 VITALS — BP 178/69 | HR 67 | Temp 97.8°F | Ht 64.0 in | Wt 215.2 lb

## 2014-06-04 DIAGNOSIS — I10 Essential (primary) hypertension: Secondary | ICD-10-CM | POA: Diagnosis not present

## 2014-06-04 NOTE — Patient Instructions (Signed)
Hypertension Hypertension, commonly called high blood pressure, is when the force of blood pumping through your arteries is too strong. Your arteries are the blood vessels that carry blood from your heart throughout your body. A blood pressure reading consists of a higher number over a lower number, such as 110/72. The higher number (systolic) is the pressure inside your arteries when your heart pumps. The lower number (diastolic) is the pressure inside your arteries when your heart relaxes. Ideally you want your blood pressure below 120/80. Hypertension forces your heart to work harder to pump blood. Your arteries may become narrow or stiff. Having hypertension puts you at risk for heart disease, stroke, and other problems.  RISK FACTORS Some risk factors for high blood pressure are controllable. Others are not.  Risk factors you cannot control include:   Race. You may be at higher risk if you are African American.  Age. Risk increases with age.  Gender. Men are at higher risk than women before age 45 years. After age 65, women are at higher risk than men. Risk factors you can control include:  Not getting enough exercise or physical activity.  Being overweight.  Getting too much fat, sugar, calories, or salt in your diet.  Drinking too much alcohol. SIGNS AND SYMPTOMS Hypertension does not usually cause signs or symptoms. Extremely high blood pressure (hypertensive crisis) may cause headache, anxiety, shortness of breath, and nosebleed. DIAGNOSIS  To check if you have hypertension, your health care provider will measure your blood pressure while you are seated, with your arm held at the level of your heart. It should be measured at least twice using the same arm. Certain conditions can cause a difference in blood pressure between your right and left arms. A blood pressure reading that is higher than normal on one occasion does not mean that you need treatment. If one blood pressure reading  is high, ask your health care provider about having it checked again. TREATMENT  Treating high blood pressure includes making lifestyle changes and possibly taking medication. Living a healthy lifestyle can help lower high blood pressure. You may need to change some of your habits. Lifestyle changes may include:  Following the DASH diet. This diet is high in fruits, vegetables, and whole grains. It is low in salt, red meat, and added sugars.  Getting at least 2 1/2 hours of brisk physical activity every week.  Losing weight if necessary.  Not smoking.  Limiting alcoholic beverages.  Learning ways to reduce stress. If lifestyle changes are not enough to get your blood pressure under control, your health care provider may prescribe medicine. You may need to take more than one. Work closely with your health care provider to understand the risks and benefits. HOME CARE INSTRUCTIONS  Have your blood pressure rechecked as directed by your health care provider.   Only take medicine as directed by your health care provider. Follow the directions carefully. Blood pressure medicines must be taken as prescribed. The medicine does not work as well when you skip doses. Skipping doses also puts you at risk for problems.   Do not smoke.   Monitor your blood pressure at home as directed by your health care provider. SEEK MEDICAL CARE IF:   You think you are having a reaction to medicines taken.  You have recurrent headaches or feel dizzy.  You have swelling in your ankles.  You have trouble with your vision. SEEK IMMEDIATE MEDICAL CARE IF:  You develop a severe headache or   confusion.  You have unusual weakness, numbness, or feel faint.  You have severe chest or abdominal pain.  You vomit repeatedly.  You have trouble breathing. MAKE SURE YOU:   Understand these instructions.  Will watch your condition.  Will get help right away if you are not doing well or get  worse. Document Released: 11/26/2005 Document Revised: 12/01/2013 Document Reviewed: 09/18/2013 [employer] Patient Information 2015 [employer]. This information is not intended to replace advice given to you by your health care provider. Make sure you discuss any questions you have with your health care provider.  

## 2014-06-04 NOTE — Progress Notes (Signed)
   Subjective:    Patient ID: Katherine Tyler, female    DOB: 11/07/1929, 80 y.o.   MRN: [id number]  HPI Pt presents to the office for a follow-up for hypertension. Pt currently taking clonidine 01mg  daily, hyzaar 100-25 daily, metoprolol 50 mg daily, and diltiazem 180 mg daily. Pt has brought in blood pressure log from home with her blood pressure averaging 140s'/60's. Pt states she has brought her blood pressure machine in recently and compared her blood pressure with our machine with little difference.  Pt denies any headaches, palpitations, or SOB.     Review of Systems  HENT: Negative.   Respiratory: Negative.   Cardiovascular: Negative.   Gastrointestinal: Negative.   Genitourinary: Negative.   Musculoskeletal: Negative.   Neurological: Negative.   Hematological: Negative.   All other systems reviewed and are negative.      Objective:   Physical Exam  Vitals reviewed. Constitutional: She is oriented to person, place, and time. She appears well-developed and well-nourished. No distress.  Cardiovascular: Normal rate, regular rhythm, normal heart sounds and intact distal pulses.   No murmur heard. Pulmonary/Chest: Effort normal and breath sounds normal. No respiratory distress. She has no wheezes.  Abdominal: Soft. Bowel sounds are normal. She exhibits no distension. There is no tenderness.  Musculoskeletal: Normal range of motion. She exhibits edema (mild edmea bil legs and ankles). She exhibits no tenderness.  Neurological: She is alert and oriented to person, place, and time. She has normal reflexes. No cranial nerve deficit.  Skin: Skin is warm and dry.  Psychiatric: She has a normal mood and affect. Her behavior is normal. Judgment and thought content normal.     BP 178/69  Pulse 67  Temp(Src) 97.8 F (36.6 C) (Oral)  Ht 5\' 4"  (1.626 m)  Wt 215 lb 3.2 oz (97.614 kg)  BMI 36.92 kg/m2      Assessment & Plan:  1. Essential hypertension -Daily blood pressure log given with  instructions on how to fill out and told to bring to next visit -Dash diet information given -Exercise encouraged - Stress Management  -Continue current med - Ambulatory referral to Nephrology -Pt to follow-up after nephrology appointment  Jannifer Rodneyhristy Hawks, FNP

## 2014-08-10 ENCOUNTER — Telehealth: Payer: Self-pay | Admitting: Family

## 2014-08-10 DIAGNOSIS — I10 Essential (primary) hypertension: Secondary | ICD-10-CM

## 2014-08-12 MED ORDER — CLONIDINE HCL 0.1 MG PO TABS: 0.1000 mg | ORAL_TABLET | Freq: Every day | ORAL | Status: DC

## 2014-08-12 MED ORDER — METOPROLOL SUCCINATE ER 50 MG PO TB24: 50.0000 mg | ORAL_TABLET | Freq: Every day | ORAL | Status: DC

## 2014-08-12 MED ORDER — DILTIAZEM HCL ER 180 MG PO CP24: 360.0000 mg | ORAL_CAPSULE | Freq: Every day | ORAL | Status: DC

## 2014-08-12 NOTE — Telephone Encounter (Signed)
RX sent to pharmacy  

## 2014-08-18 DIAGNOSIS — N183 Chronic kidney disease, stage 3 unspecified: Secondary | ICD-10-CM | POA: Diagnosis not present

## 2014-08-18 DIAGNOSIS — R809 Proteinuria, unspecified: Secondary | ICD-10-CM | POA: Diagnosis not present

## 2014-08-18 DIAGNOSIS — I10 Essential (primary) hypertension: Secondary | ICD-10-CM | POA: Diagnosis not present

## 2014-08-27 DIAGNOSIS — E559 Vitamin D deficiency, unspecified: Secondary | ICD-10-CM | POA: Diagnosis not present

## 2014-08-27 DIAGNOSIS — I10 Essential (primary) hypertension: Secondary | ICD-10-CM | POA: Diagnosis not present

## 2014-08-27 DIAGNOSIS — Z79899 Other long term (current) drug therapy: Secondary | ICD-10-CM | POA: Diagnosis not present

## 2014-08-27 DIAGNOSIS — R809 Proteinuria, unspecified: Secondary | ICD-10-CM | POA: Diagnosis not present

## 2014-08-27 DIAGNOSIS — D649 Anemia, unspecified: Secondary | ICD-10-CM | POA: Diagnosis not present

## 2014-08-27 DIAGNOSIS — N189 Chronic kidney disease, unspecified: Secondary | ICD-10-CM | POA: Diagnosis not present

## 2014-09-15 DIAGNOSIS — N183 Chronic kidney disease, stage 3 (moderate): Secondary | ICD-10-CM | POA: Diagnosis not present

## 2014-09-15 DIAGNOSIS — R809 Proteinuria, unspecified: Secondary | ICD-10-CM | POA: Diagnosis not present

## 2014-09-15 DIAGNOSIS — I10 Essential (primary) hypertension: Secondary | ICD-10-CM | POA: Diagnosis not present

## 2014-09-15 DIAGNOSIS — D649 Anemia, unspecified: Secondary | ICD-10-CM | POA: Diagnosis not present

## 2014-10-11 DIAGNOSIS — Z23 Encounter for immunization: Secondary | ICD-10-CM | POA: Diagnosis not present

## 2014-12-06 ENCOUNTER — Telehealth: Payer: Self-pay | Admitting: Family

## 2014-12-06 MED ORDER — LEVOTHYROXINE SODIUM 125 MCG PO TABS: 125.0000 ug | ORAL_TABLET | Freq: Every day | ORAL | Status: DC

## 2014-12-06 NOTE — Telephone Encounter (Signed)
Patient aware,refill done but must be seen for further refills.

## 2015-02-14 ENCOUNTER — Other Ambulatory Visit: Payer: Self-pay | Admitting: Family

## 2015-02-14 ENCOUNTER — Other Ambulatory Visit (INDEPENDENT_AMBULATORY_CARE_PROVIDER_SITE_OTHER): Payer: Medicare Other

## 2015-02-14 DIAGNOSIS — R809 Proteinuria, unspecified: Secondary | ICD-10-CM

## 2015-02-14 DIAGNOSIS — Z79899 Other long term (current) drug therapy: Secondary | ICD-10-CM

## 2015-02-14 DIAGNOSIS — N183 Chronic kidney disease, stage 3 unspecified: Secondary | ICD-10-CM

## 2015-02-14 DIAGNOSIS — E559 Vitamin D deficiency, unspecified: Secondary | ICD-10-CM

## 2015-02-14 DIAGNOSIS — D509 Iron deficiency anemia, unspecified: Secondary | ICD-10-CM | POA: Diagnosis not present

## 2015-02-14 DIAGNOSIS — I10 Essential (primary) hypertension: Secondary | ICD-10-CM

## 2015-02-14 MED ORDER — LOSARTAN POTASSIUM-HCTZ 100-25 MG PO TABS: 1.0000 | ORAL_TABLET | Freq: Every day | ORAL | Status: DC

## 2015-02-14 MED ORDER — LEVOTHYROXINE SODIUM 125 MCG PO TABS: 125.0000 ug | ORAL_TABLET | Freq: Every day | ORAL | Status: DC

## 2015-02-14 MED ORDER — MAGNESIUM OXIDE 400 (241.3 MG) MG PO TABS: 400.0000 mg | ORAL_TABLET | Freq: Every day | ORAL | Status: AC

## 2015-02-14 NOTE — Telephone Encounter (Signed)
rx's faxed to pharmacy as requested.

## 2015-02-14 NOTE — Progress Notes (Signed)
Lab work for Dr Salomon MastBelayenh Befekadu Fax number [phone]

## 2015-02-15 LAB — HEMOGLOBIN AND HEMATOCRIT, BLOOD
HEMATOCRIT: 31.8 % — AB (ref 34.0–46.6)
HEMOGLOBIN: 10.5 g/dL — AB (ref 11.1–15.9)

## 2015-02-15 LAB — RENAL FUNCTION PANEL
Albumin: 4.1 g/dL (ref 3.5–4.7)
BUN/Creatinine Ratio: 26 (ref 11–26)
BUN: 30 mg/dL — ABNORMAL HIGH (ref 8–27)
CO2: 22 mmol/L (ref 18–29)
Calcium: 9.7 mg/dL (ref 8.7–10.3)
Chloride: 88 mmol/L — ABNORMAL LOW (ref 97–108)
Creatinine, Ser: 1.17 mg/dL — ABNORMAL HIGH (ref 0.57–1.00)
GFR calc Af Amer: 50 mL/min/1.73 — ABNORMAL LOW
GFR calc non Af Amer: 43 mL/min/1.73 — ABNORMAL LOW
Glucose: 113 mg/dL — ABNORMAL HIGH (ref 65–99)
Phosphorus: 3 mg/dL (ref 2.5–4.5)
Potassium: 4.4 mmol/L (ref 3.5–5.2)
Sodium: 127 mmol/L — ABNORMAL LOW (ref 134–144)

## 2015-02-15 LAB — VITAMIN D 25 HYDROXY (VIT D DEFICIENCY, FRACTURES): Vit D, 25-Hydroxy: 29.6 ng/mL — ABNORMAL LOW (ref 30.0–100.0)

## 2015-02-15 LAB — PROTEIN / CREATININE RATIO, URINE
Creatinine, Urine: 52.2 mg/dL (ref 15.0–278.0)
Protein, Ur: 23.1 mg/dL — ABNORMAL HIGH (ref 0.0–15.0)
Protein/Creat Ratio: 443 mg/g{creat} — ABNORMAL HIGH (ref 0–200)

## 2015-02-15 LAB — PARATHYROID HORMONE, INTACT (NO CA): PTH: 30 pg/mL (ref 15–65)

## 2015-02-16 DIAGNOSIS — E871 Hypo-osmolality and hyponatremia: Secondary | ICD-10-CM | POA: Diagnosis not present

## 2015-02-16 DIAGNOSIS — R809 Proteinuria, unspecified: Secondary | ICD-10-CM | POA: Diagnosis not present

## 2015-02-16 DIAGNOSIS — N183 Chronic kidney disease, stage 3 (moderate): Secondary | ICD-10-CM | POA: Diagnosis not present

## 2015-02-16 DIAGNOSIS — E559 Vitamin D deficiency, unspecified: Secondary | ICD-10-CM | POA: Diagnosis not present

## 2015-03-29 ENCOUNTER — Other Ambulatory Visit: Payer: Self-pay

## 2015-04-08 ENCOUNTER — Other Ambulatory Visit: Payer: Self-pay | Admitting: Family

## 2015-04-11 ENCOUNTER — Other Ambulatory Visit: Payer: Self-pay

## 2015-04-11 ENCOUNTER — Telehealth: Payer: Self-pay | Admitting: Family

## 2015-04-11 MED ORDER — LEVOTHYROXINE SODIUM 125 MCG PO TABS: 125.0000 ug | ORAL_TABLET | Freq: Every day | ORAL | Status: DC

## 2015-04-11 NOTE — Telephone Encounter (Signed)
done

## 2015-04-18 ENCOUNTER — Other Ambulatory Visit: Payer: Self-pay | Admitting: Family

## 2015-04-18 DIAGNOSIS — I10 Essential (primary) hypertension: Secondary | ICD-10-CM

## 2015-04-19 MED ORDER — CHLORTHALIDONE 25 MG PO TABS: 12.5000 mg | ORAL_TABLET | Freq: Every day | ORAL | Status: DC

## 2015-04-19 NOTE — Telephone Encounter (Signed)
Pt aware rx sent to pharmacy and that no refills will be given until seen in office.

## 2015-04-19 NOTE — Telephone Encounter (Signed)
Spoke to pharmacy and they have the pt's rx, no further action needed.

## 2015-05-03 ENCOUNTER — Ambulatory Visit (INDEPENDENT_AMBULATORY_CARE_PROVIDER_SITE_OTHER): Payer: Medicare Other

## 2015-05-03 ENCOUNTER — Ambulatory Visit (INDEPENDENT_AMBULATORY_CARE_PROVIDER_SITE_OTHER): Payer: Medicare Other | Admitting: Family

## 2015-05-03 ENCOUNTER — Encounter: Payer: Self-pay | Admitting: Family

## 2015-05-03 ENCOUNTER — Ambulatory Visit: Payer: Medicare Other | Admitting: Family

## 2015-05-03 VITALS — BP 175/69 | HR 56 | Temp 97.1°F | Ht 64.0 in | Wt 209.8 lb

## 2015-05-03 DIAGNOSIS — Z23 Encounter for immunization: Secondary | ICD-10-CM

## 2015-05-03 DIAGNOSIS — E039 Hypothyroidism, unspecified: Secondary | ICD-10-CM | POA: Diagnosis not present

## 2015-05-03 DIAGNOSIS — Z418 Encounter for other procedures for purposes other than remedying health state: Secondary | ICD-10-CM | POA: Diagnosis not present

## 2015-05-03 DIAGNOSIS — E559 Vitamin D deficiency, unspecified: Secondary | ICD-10-CM | POA: Diagnosis not present

## 2015-05-03 DIAGNOSIS — Z780 Asymptomatic menopausal state: Secondary | ICD-10-CM

## 2015-05-03 DIAGNOSIS — I10 Essential (primary) hypertension: Secondary | ICD-10-CM | POA: Diagnosis not present

## 2015-05-03 DIAGNOSIS — J302 Other seasonal allergic rhinitis: Secondary | ICD-10-CM

## 2015-05-03 DIAGNOSIS — F411 Generalized anxiety disorder: Secondary | ICD-10-CM | POA: Diagnosis not present

## 2015-05-03 DIAGNOSIS — Z299 Encounter for prophylactic measures, unspecified: Secondary | ICD-10-CM

## 2015-05-03 MED ORDER — METOPROLOL SUCCINATE ER 50 MG PO TB24: 50.0000 mg | ORAL_TABLET | Freq: Every day | ORAL | Status: DC

## 2015-05-03 MED ORDER — LOSARTAN POTASSIUM 100 MG PO TABS: 100.0000 mg | ORAL_TABLET | Freq: Every day | ORAL | Status: DC

## 2015-05-03 MED ORDER — FLUTICASONE PROPIONATE 50 MCG/ACT NA SUSP: NASAL | Status: DC

## 2015-05-03 MED ORDER — CLONIDINE HCL 0.1 MG PO TABS: 0.1000 mg | ORAL_TABLET | Freq: Every day | ORAL | Status: DC

## 2015-05-03 MED ORDER — LEVOTHYROXINE SODIUM 125 MCG PO TABS: 125.0000 ug | ORAL_TABLET | Freq: Every day | ORAL | Status: DC

## 2015-05-03 MED ORDER — DILTIAZEM HCL ER 180 MG PO CP24: 360.0000 mg | ORAL_CAPSULE | Freq: Every day | ORAL | Status: DC

## 2015-05-03 MED ORDER — CITALOPRAM HYDROBROMIDE 20 MG PO TABS: 20.0000 mg | ORAL_TABLET | Freq: Every day | ORAL | Status: DC

## 2015-05-03 MED ORDER — CHLORTHALIDONE 25 MG PO TABS: 12.5000 mg | ORAL_TABLET | Freq: Every day | ORAL | Status: DC

## 2015-05-03 NOTE — Patient Instructions (Signed)
Health Maintenance Adopting a healthy lifestyle and getting preventive care can go a long way to promote health and wellness. Talk with your health care provider about what schedule of regular examinations is right for you. This is a good chance for you to check in with your provider about disease prevention and staying healthy. In between checkups, there are plenty of things you can do on your own. Experts have done a lot of research about which lifestyle changes and preventive measures are most likely to keep you healthy. Ask your health care provider for more information. WEIGHT AND DIET  Eat a healthy diet  Be sure to include plenty of vegetables, fruits, low-fat dairy products, and lean protein.  Do not eat a lot of foods high in solid fats, added sugars, or salt.  Get regular exercise. This is one of the most important things you can do for your health.  Most adults should exercise for at least 150 minutes each week. The exercise should increase your heart rate and make you sweat (moderate-intensity exercise).  Most adults should also do strengthening exercises at least twice a week. This is in addition to the moderate-intensity exercise.  Maintain a healthy weight  Body mass index (BMI) is a measurement that can be used to identify possible weight problems. It estimates body fat based on height and weight. Your health care provider can help determine your BMI and help you achieve or maintain a healthy weight.  For females 25 years of age and older:   A BMI below 18.5 is considered underweight.  A BMI of 18.5 to 24.9 is normal.  A BMI of 25 to 29.9 is considered overweight.  A BMI of 30 and above is considered obese.  Watch levels of cholesterol and blood lipids  You should start having your blood tested for lipids and cholesterol at 79 years of age, then have this test every 5 years.  You may need to have your cholesterol levels checked more often if:  Your lipid or  cholesterol levels are high.  You are older than 79 years of age.  You are at high risk for heart disease.  CANCER SCREENING   Lung Cancer  Lung cancer screening is recommended for adults 97-92 years old who are at high risk for lung cancer because of a history of smoking.  A yearly low-dose CT scan of the lungs is recommended for people who:  Currently smoke.  Have quit within the past 15 years.  Have at least a 30-pack-year history of smoking. A pack year is smoking an average of one pack of cigarettes a day for 1 year.  Yearly screening should continue until it has been 15 years since you quit.  Yearly screening should stop if you develop a health problem that would prevent you from having lung cancer treatment.  Breast Cancer  Practice breast self-awareness. This means understanding how your breasts normally appear and feel.  It also means doing regular breast self-exams. Let your health care provider know about any changes, no matter how small.  If you are in your 20s or 30s, you should have a clinical breast exam (CBE) by a health care provider every 1-3 years as part of a regular health exam.  If you are 76 or older, have a CBE every year. Also consider having a breast X-ray (mammogram) every year.  If you have a family history of breast cancer, talk to your health care provider about genetic screening.  If you are  at high risk for breast cancer, talk to your health care provider about having an MRI and a mammogram every year.  Breast cancer gene (BRCA) assessment is recommended for women who have family members with BRCA-related cancers. BRCA-related cancers include:  Breast.  Ovarian.  Tubal.  Peritoneal cancers.  Results of the assessment will determine the need for genetic counseling and BRCA1 and BRCA2 testing. Cervical Cancer Routine pelvic examinations to screen for cervical cancer are no longer recommended for nonpregnant women who are considered low  risk for cancer of the pelvic organs (ovaries, uterus, and vagina) and who do not have symptoms. A pelvic examination may be necessary if you have symptoms including those associated with pelvic infections. Ask your health care provider if a screening pelvic exam is right for you.   The Pap test is the screening test for cervical cancer for women who are considered at risk.  If you had a hysterectomy for a problem that was not cancer or a condition that could lead to cancer, then you no longer need Pap tests.  If you are older than 65 years, and you have had normal Pap tests for the past 10 years, you no longer need to have Pap tests.  If you have had past treatment for cervical cancer or a condition that could lead to cancer, you need Pap tests and screening for cancer for at least 20 years after your treatment.  If you no longer get a Pap test, assess your risk factors if they change (such as having a new sexual partner). This can affect whether you should start being screened again.  Some women have medical problems that increase their chance of getting cervical cancer. If this is the case for you, your health care provider may recommend more frequent screening and Pap tests.  The human papillomavirus (HPV) test is another test that may be used for cervical cancer screening. The HPV test looks for the virus that can cause cell changes in the cervix. The cells collected during the Pap test can be tested for HPV.  The HPV test can be used to screen women 30 years of age and older. Getting tested for HPV can extend the interval between normal Pap tests from three to five years.  An HPV test also should be used to screen women of any age who have unclear Pap test results.  After 79 years of age, women should have HPV testing as often as Pap tests.  Colorectal Cancer  This type of cancer can be detected and often prevented.  Routine colorectal cancer screening usually begins at 79 years of  age and continues through 79 years of age.  Your health care provider may recommend screening at an earlier age if you have risk factors for colon cancer.  Your health care provider may also recommend using home test kits to check for hidden blood in the stool.  A small camera at the end of a tube can be used to examine your colon directly (sigmoidoscopy or colonoscopy). This is done to check for the earliest forms of colorectal cancer.  Routine screening usually begins at age 50.  Direct examination of the colon should be repeated every 5-10 years through 79 years of age. However, you may need to be screened more often if early forms of precancerous polyps or small growths are found. Skin Cancer  Check your skin from head to toe regularly.  Tell your health care provider about any new moles or changes in   moles, especially if there is a change in a mole's shape or color.  Also tell your health care provider if you have a mole that is larger than the size of a pencil eraser.  Always use sunscreen. Apply sunscreen liberally and repeatedly throughout the day.  Protect yourself by wearing long sleeves, pants, a wide-brimmed hat, and sunglasses whenever you are outside. HEART DISEASE, DIABETES, AND HIGH BLOOD PRESSURE   Have your blood pressure checked at least every 1-2 years. High blood pressure causes heart disease and increases the risk of stroke.  If you are between 75 years and 42 years old, ask your health care provider if you should take aspirin to prevent strokes.  Have regular diabetes screenings. This involves taking a blood sample to check your fasting blood sugar level.  If you are at a normal weight and have a low risk for diabetes, have this test once every three years after 79 years of age.  If you are overweight and have a high risk for diabetes, consider being tested at a younger age or more often. PREVENTING INFECTION  Hepatitis B  If you have a higher risk for  hepatitis B, you should be screened for this virus. You are considered at high risk for hepatitis B if:  You were born in a country where hepatitis B is common. Ask your health care provider which countries are considered high risk.  Your parents were born in a high-risk country, and you have not been immunized against hepatitis B (hepatitis B vaccine).  You have HIV or AIDS.  You use needles to inject street drugs.  You live with someone who has hepatitis B.  You have had sex with someone who has hepatitis B.  You get hemodialysis treatment.  You take certain medicines for conditions, including cancer, organ transplantation, and autoimmune conditions. Hepatitis C  Blood testing is recommended for:  Everyone born from [birth year] through 1963.  Anyone with known risk factors for hepatitis C. Sexually transmitted infections (STIs)  You should be screened for sexually transmitted infections (STIs) including gonorrhea and chlamydia if:  You are sexually active and are younger than 79 years of age.  You are older than 79 years of age and your health care provider tells you that you are at risk for this type of infection.  Your sexual activity has changed since you were last screened and you are at an increased risk for chlamydia or gonorrhea. Ask your health care provider if you are at risk.  If you do not have HIV, but are at risk, it may be recommended that you take a prescription medicine daily to prevent HIV infection. This is called pre-exposure prophylaxis (PrEP). You are considered at risk if:  You are sexually active and do not regularly use condoms or know the HIV status of your partner(s).  You take drugs by injection.  You are sexually active with a partner who has HIV. Talk with your health care provider about whether you are at high risk of being infected with HIV. If you choose to begin PrEP, you should first be tested for HIV. You should then be tested every 3 months for  as long as you are taking PrEP.  PREGNANCY   If you are premenopausal and you may become pregnant, ask your health care provider about preconception counseling.  If you may become pregnant, take 400 to 800 micrograms (mcg) of folic acid every day.  If you want to prevent pregnancy, talk to your  health care provider about birth control (contraception). OSTEOPOROSIS AND MENOPAUSE   Osteoporosis is a disease in which the bones lose minerals and strength with aging. This can result in serious bone fractures. Your risk for osteoporosis can be identified using a bone density scan.  If you are 65 years of age or older, or if you are at risk for osteoporosis and fractures, ask your health care provider if you should be screened.  Ask your health care provider whether you should take a calcium or vitamin D supplement to lower your risk for osteoporosis.  Menopause may have certain physical symptoms and risks.  Hormone replacement therapy may reduce some of these symptoms and risks. Talk to your health care provider about whether hormone replacement therapy is right for you.  HOME CARE INSTRUCTIONS   Schedule regular health, dental, and eye exams.  Stay current with your immunizations.   Do not use any tobacco products including cigarettes, chewing tobacco, or electronic cigarettes.  If you are pregnant, do not drink alcohol.  If you are breastfeeding, limit how much and how often you drink alcohol.  Limit alcohol intake to no more than 1 drink per day for nonpregnant women. One drink equals 12 ounces of beer, 5 ounces of wine, or 1 ounces of hard liquor.  Do not use street drugs.  Do not share needles.  Ask your health care provider for help if you need support or information about quitting drugs.  Tell your health care provider if you often feel depressed.  Tell your health care provider if you have ever been abused or do not feel safe at home. Document Released: 06/11/2011  Document Revised: 04/12/2014 Document Reviewed: 10/28/2013 [employer] Patient Information 2015 [employer]. This information is not intended to replace advice given to you by your health care provider. Make sure you discuss any questions you have with your health care provider. Hypertension Hypertension, commonly called high blood pressure, is when the force of blood pumping through your arteries is too strong. Your arteries are the blood vessels that carry blood from your heart throughout your body. A blood pressure reading consists of a higher number over a lower number, such as 110/72. The higher number (systolic) is the pressure inside your arteries when your heart pumps. The lower number (diastolic) is the pressure inside your arteries when your heart relaxes. Ideally you want your blood pressure below 120/80. Hypertension forces your heart to work harder to pump blood. Your arteries may become narrow or stiff. Having hypertension puts you at risk for heart disease, stroke, and other problems.  RISK FACTORS Some risk factors for high blood pressure are controllable. Others are not.  Risk factors you cannot control include:   Race. You may be at higher risk if you are African American.  Age. Risk increases with age.  Gender. Men are at higher risk than women before age 45 years. After age 65, women are at higher risk than men. Risk factors you can control include:  Not getting enough exercise or physical activity.  Being overweight.  Getting too much fat, sugar, calories, or salt in your diet.  Drinking too much alcohol. SIGNS AND SYMPTOMS Hypertension does not usually cause signs or symptoms. Extremely high blood pressure (hypertensive crisis) may cause headache, anxiety, shortness of breath, and nosebleed. DIAGNOSIS  To check if you have hypertension, your health care provider will measure your blood pressure while you are seated, with your arm held at the level of your heart. It    should be measured at least twice using the same arm. Certain conditions can cause a difference in blood pressure between your right and left arms. A blood pressure reading that is higher than normal on one occasion does not mean that you need treatment. If one blood pressure reading is high, ask your health care provider about having it checked again. TREATMENT  Treating high blood pressure includes making lifestyle changes and possibly taking medicine. Living a healthy lifestyle can help lower high blood pressure. You may need to change some of your habits. Lifestyle changes may include:  Following the DASH diet. This diet is high in fruits, vegetables, and whole grains. It is low in salt, red meat, and added sugars.  Getting at least 2 hours of brisk physical activity every week.  Losing weight if necessary.  Not smoking.  Limiting alcoholic beverages.  Learning ways to reduce stress. If lifestyle changes are not enough to get your blood pressure under control, your health care provider may prescribe medicine. You may need to take more than one. Work closely with your health care provider to understand the risks and benefits. HOME CARE INSTRUCTIONS  Have your blood pressure rechecked as directed by your health care provider.   Take medicines only as directed by your health care provider. Follow the directions carefully. Blood pressure medicines must be taken as prescribed. The medicine does not work as well when you skip doses. Skipping doses also puts you at risk for problems.   Do not smoke.   Monitor your blood pressure at home as directed by your health care provider. SEEK MEDICAL CARE IF:   You think you are having a reaction to medicines taken.  You have recurrent headaches or feel dizzy.  You have swelling in your ankles.  You have trouble with your vision. SEEK IMMEDIATE MEDICAL CARE IF:  You develop a severe headache or confusion.  You have unusual weakness,  numbness, or feel faint.  You have severe chest or abdominal pain.  You vomit repeatedly.  You have trouble breathing. MAKE SURE YOU:   Understand these instructions.  Will watch your condition.  Will get help right away if you are not doing well or get worse. Document Released: 11/26/2005 Document Revised: 04/12/2014 Document Reviewed: 09/18/2013 [employer] Patient Information 2015 [employer]. This information is not intended to replace advice given to you by your health care provider. Make sure you discuss any questions you have with your health care provider.  

## 2015-05-03 NOTE — Progress Notes (Signed)
Subjective:    Patient ID: Katherine Tyler, female    DOB: May 06, 1929, 81 y.o.   MRN: [id number]  Hypertension This is a chronic problem. The current episode started more than 1 year ago. The problem has been waxing and waning since onset. The problem is uncontrolled. Pertinent negatives include no anxiety, headaches, palpitations, peripheral edema, shortness of breath or sweats. Risk factors for coronary artery disease include family history, obesity, post-menopausal state and sedentary lifestyle. Past treatments include angiotensin blockers, diuretics, direct vasodilators, calcium channel blockers and beta blockers. The current treatment provides mild improvement. Hypertensive end-organ damage includes kidney disease and a thyroid problem. There is no history of CAD/MI, CVA or heart failure.  Thyroid Problem Presents for follow-up visit. Symptoms include anxiety. Patient reports no depressed mood, diarrhea, heat intolerance, menstrual problem, palpitations or visual change. The symptoms have been stable. Past treatments include levothyroxine. The treatment provided significant relief. There is no history of heart failure.  Anxiety Presents for follow-up visit. Symptoms include excessive worry and nervous/anxious behavior. Patient reports no confusion, depressed mood, insomnia, palpitations or shortness of breath. Symptoms occur rarely. The severity of symptoms is mild. The symptoms are aggravated by family issues.   Her past medical history is significant for anxiety/panic attacks. There is no history of CAD or depression. Past treatments include SSRIs. The treatment provided significant relief. Compliance with prior treatments has been good.   *Pt is followed by Nephrologists every 6 months for CKD and uncontrolled HTN.   Review of Systems  Constitutional: Negative.   HENT: Negative.   Eyes: Negative.   Respiratory: Negative.  Negative for shortness of breath.   Cardiovascular: Negative.  Negative  for palpitations.  Gastrointestinal: Negative.  Negative for diarrhea.  Endocrine: Negative.  Negative for heat intolerance.  Genitourinary: Negative.  Negative for menstrual problem.  Musculoskeletal: Negative.   Neurological: Negative.  Negative for headaches.  Hematological: Negative.   Psychiatric/Behavioral: Negative for confusion. The patient is nervous/anxious. The patient does not have insomnia.   All other systems reviewed and are negative.      Objective:   Physical Exam  Constitutional: She is oriented to person, place, and time. She appears well-developed and well-nourished. No distress.  HENT:  Head: Normocephalic and atraumatic.  Right Ear: External ear normal.  Left Ear: External ear normal.  Nose: Nose normal.  Mouth/Throat: Oropharynx is clear and moist.  Eyes: Pupils are equal, round, and reactive to light.  Neck: Normal range of motion. Neck supple. No thyromegaly present.  Cardiovascular: Normal rate, regular rhythm, normal heart sounds and intact distal pulses.   No murmur heard. Pulmonary/Chest: Effort normal and breath sounds normal. No respiratory distress. She has no wheezes.  Abdominal: Soft. Bowel sounds are normal. She exhibits no distension. There is no tenderness.  Musculoskeletal: Normal range of motion. She exhibits no edema or tenderness.  Neurological: She is alert and oriented to person, place, and time. She has normal reflexes. No cranial nerve deficit.  Skin: Skin is warm and dry.  Psychiatric: She has a normal mood and affect. Her behavior is normal. Judgment and thought content normal.  Vitals reviewed.     BP 175/69 mmHg  Pulse 56  Temp(Src) 97.1 F (36.2 C) (Oral)  Ht _0  (1.626 m)  Wt 209 lb 12.8 oz (95.165 kg)  BMI 35.99 kg/m2     Assessment & Plan:  1. Essential hypertension - CMP14+EGFR - chlorthalidone (HYGROTON) 25 MG tablet; Take 0.5 tablets (12.5 mg total) by mouth daily.  Dispense: 90 tablet; Refill: 0 - metoprolol  succinate (TOPROL-XL) 50 MG 24 hr tablet; Take 1 tablet (50 mg total) by mouth daily. Take with or immediately following a meal.  Dispense: 90 tablet; Refill: 3 - diltiazem (DILACOR XR) 180 MG 24 hr capsule; Take 2 capsules (360 mg total) by mouth daily.  Dispense: 180 capsule; Refill: 3 - cloNIDine (CATAPRES) 0.1 MG tablet; Take 1 tablet (0.1 mg total) by mouth daily.  Dispense: 90 tablet; Refill: 3 - citalopram (CELEXA) 20 MG tablet; Take 1 tablet (20 mg total) by mouth daily.  Dispense: 90 tablet; Refill: 3 - losartan (COZAAR) 100 MG tablet; Take 1 tablet (100 mg total) by mouth daily. Changed by Fran Lowes , MD (kidney doctor)  Dispense: 90 tablet; Refill: 3  2. Hypothyroidism, unspecified hypothyroidism type - CMP14+EGFR - Thyroid Panel With TSH - levothyroxine (SYNTHROID, LEVOTHROID) 125 MCG tablet; Take 1 tablet (125 mcg total) by mouth daily before breakfast.  Dispense: 90 tablet; Refill: 3  3. Vitamin D deficiency - CMP14+EGFR - Vit D  25 hydroxy (rtn osteoporosis monitoring)  4. Seasonal allergic rhinitis - CMP14+EGFR - fluticasone (FLONASE) 50 MCG/ACT nasal spray; PLACE 2 SPRAYS INTO THE NOSE DAILY.  Dispense: 16 g; Refill: 3  5. Post-menopausal - CMP14+EGFR - Vit D  25 hydroxy (rtn osteoporosis monitoring) - DG Bone Density; Future  6. GAD (generalized anxiety disorder) - CMP14+EGFR - citalopram (CELEXA) 20 MG tablet; Take 1 tablet (20 mg total) by mouth daily.  Dispense: 90 tablet; Refill: 3   Continue all meds Labs pending Health Maintenance reviewed Diet and exercise encouraged RTO 4 months  Evelina Dun, FNP

## 2015-05-04 ENCOUNTER — Other Ambulatory Visit: Payer: Self-pay | Admitting: Family

## 2015-05-04 LAB — CMP14+EGFR
ALBUMIN: 4.4 g/dL (ref 3.5–4.7)
ALK PHOS: 57 IU/L (ref 39–117)
ALT: 18 IU/L (ref 0–32)
AST: 23 IU/L (ref 0–40)
Albumin/Globulin Ratio: 1.7 (ref 1.1–2.5)
BUN / CREAT RATIO: 23 (ref 11–26)
BUN: 26 mg/dL (ref 8–27)
Bilirubin Total: 0.3 mg/dL (ref 0.0–1.2)
CO2: 25 mmol/L (ref 18–29)
CREATININE: 1.11 mg/dL — AB (ref 0.57–1.00)
Calcium: 9.5 mg/dL (ref 8.7–10.3)
Chloride: 91 mmol/L — ABNORMAL LOW (ref 97–108)
GFR calc Af Amer: 53 mL/min/{1.73_m2} — ABNORMAL LOW (ref >59)
GFR, EST NON AFRICAN AMERICAN: 46 mL/min/{1.73_m2} — AB (ref >59)
GLUCOSE: 90 mg/dL (ref 65–99)
Globulin, Total: 2.6 g/dL (ref 1.5–4.5)
POTASSIUM: 4.3 mmol/L (ref 3.5–5.2)
Sodium: 131 mmol/L — ABNORMAL LOW (ref 134–144)
Total Protein: 7 g/dL (ref 6.0–8.5)

## 2015-05-04 LAB — VITAMIN D 25 HYDROXY (VIT D DEFICIENCY, FRACTURES): Vit D, 25-Hydroxy: 33.5 ng/mL (ref 30.0–100.0)

## 2015-05-04 LAB — THYROID PANEL WITH TSH
FREE THYROXINE INDEX: 2.4 (ref 1.2–4.9)
T3 Uptake Ratio: 25 % (ref 24–39)
T4, Total: 9.7 ug/dL (ref 4.5–12.0)
TSH: 7.31 u[IU]/mL — AB (ref 0.450–4.500)

## 2015-05-04 MED ORDER — LEVOTHYROXINE SODIUM 150 MCG PO TABS: 150.0000 ug | ORAL_TABLET | Freq: Every day | ORAL | Status: DC

## 2015-08-01 ENCOUNTER — Other Ambulatory Visit (INDEPENDENT_AMBULATORY_CARE_PROVIDER_SITE_OTHER): Payer: Medicare Other

## 2015-08-01 DIAGNOSIS — R809 Proteinuria, unspecified: Secondary | ICD-10-CM

## 2015-08-01 DIAGNOSIS — Z79899 Other long term (current) drug therapy: Secondary | ICD-10-CM

## 2015-08-01 DIAGNOSIS — I10 Essential (primary) hypertension: Secondary | ICD-10-CM | POA: Diagnosis not present

## 2015-08-01 DIAGNOSIS — N183 Chronic kidney disease, stage 3 unspecified: Secondary | ICD-10-CM

## 2015-08-01 DIAGNOSIS — E559 Vitamin D deficiency, unspecified: Secondary | ICD-10-CM | POA: Diagnosis not present

## 2015-08-01 DIAGNOSIS — R5383 Other fatigue: Secondary | ICD-10-CM | POA: Diagnosis not present

## 2015-08-01 NOTE — Progress Notes (Signed)
LAB ONLY 

## 2015-08-02 LAB — IRON AND TIBC
IRON SATURATION: 21 % (ref 15–55)
Iron: 66 ug/dL (ref 27–139)
Total Iron Binding Capacity: 315 ug/dL (ref 250–450)
UIBC: 249 ug/dL (ref 118–369)

## 2015-08-02 LAB — RENAL FUNCTION PANEL
Albumin: 4.5 g/dL (ref 3.5–4.7)
BUN / CREAT RATIO: 21 (ref 11–26)
BUN: 23 mg/dL (ref 8–27)
CHLORIDE: 87 mmol/L — AB (ref 97–108)
CO2: 25 mmol/L (ref 18–29)
Calcium: 9.9 mg/dL (ref 8.7–10.3)
Creatinine, Ser: 1.07 mg/dL — ABNORMAL HIGH (ref 0.57–1.00)
GFR, EST AFRICAN AMERICAN: 55 mL/min/{1.73_m2} — AB (ref >59)
GFR, EST NON AFRICAN AMERICAN: 48 mL/min/{1.73_m2} — AB (ref >59)
GLUCOSE: 105 mg/dL — AB (ref 65–99)
POTASSIUM: 4.3 mmol/L (ref 3.5–5.2)
Phosphorus: 3.3 mg/dL (ref 2.5–4.5)
SODIUM: 129 mmol/L — AB (ref 134–144)

## 2015-08-02 LAB — PROTEIN / CREATININE RATIO, URINE
Creatinine, Urine: 58.4 mg/dL
Protein, Ur: 34.8 mg/dL
Protein/Creat Ratio: 596 mg/g creat — ABNORMAL HIGH (ref 0–200)

## 2015-08-02 LAB — PARATHYROID HORMONE, INTACT (NO CA): PTH: 22 pg/mL (ref 15–65)

## 2015-08-02 LAB — HEMOGLOBIN: Hemoglobin: 12.1 g/dL (ref 11.1–15.9)

## 2015-08-02 LAB — FERRITIN: Ferritin: 99 ng/mL (ref 15–150)

## 2015-08-02 LAB — VITAMIN D 25 HYDROXY (VIT D DEFICIENCY, FRACTURES): VIT D 25 HYDROXY: 30.9 ng/mL (ref 30.0–100.0)

## 2015-08-02 LAB — HEMATOCRIT: Hematocrit: 36.9 % (ref 34.0–46.6)

## 2015-08-03 DIAGNOSIS — E559 Vitamin D deficiency, unspecified: Secondary | ICD-10-CM | POA: Diagnosis not present

## 2015-08-03 DIAGNOSIS — E871 Hypo-osmolality and hyponatremia: Secondary | ICD-10-CM | POA: Diagnosis not present

## 2015-08-03 DIAGNOSIS — R809 Proteinuria, unspecified: Secondary | ICD-10-CM | POA: Diagnosis not present

## 2015-08-03 DIAGNOSIS — N183 Chronic kidney disease, stage 3 (moderate): Secondary | ICD-10-CM | POA: Diagnosis not present

## 2015-08-08 ENCOUNTER — Telehealth: Payer: Self-pay | Admitting: Family

## 2015-08-08 NOTE — Telephone Encounter (Signed)
Patient has fallen a couple of times this month and her nephrologist suggested that she have gait training through physical therapy.  Appointment scheduled with Jannifer Rodney, FNP 08/10/15 to discuss and place referral if needed.

## 2015-08-10 ENCOUNTER — Ambulatory Visit (INDEPENDENT_AMBULATORY_CARE_PROVIDER_SITE_OTHER): Payer: Medicare Other | Admitting: Family

## 2015-08-10 ENCOUNTER — Encounter: Payer: Self-pay | Admitting: Family

## 2015-08-10 VITALS — BP 176/65 | HR 63 | Temp 98.2°F | Ht 64.0 in | Wt 206.8 lb

## 2015-08-10 DIAGNOSIS — W19XXXA Unspecified fall, initial encounter: Secondary | ICD-10-CM

## 2015-08-10 DIAGNOSIS — R2681 Unsteadiness on feet: Secondary | ICD-10-CM | POA: Diagnosis not present

## 2015-08-10 NOTE — Progress Notes (Signed)
   Subjective:    Patient ID: Katherine Tyler, female    DOB: 07/10/1929, 81 y.o.   MRN: [id number]  HPI Pt presents to the office today to discuss physical thearpy. Pt states she has fallen four times this year and states her gait is unsteady. Pt states she stopped using her cane and is now using a walker which has helped a little, but still does not feel like she is "steady". Pt states all four times she has fallen she was in her home walking to the bathroom, kitchen, or ect. Pt states one of these falls were last week. Pt states she was walking the bathroom and fell and hit a chair. Pt has large bruising on her right arm. Pt denies any injuries other than bruising.  Pt saw nephrologists on 08/01/15 and had blood work (Hgb, Hct, and renal function panel) that was stable.    Review of Systems  Constitutional: Negative.   HENT: Negative.   Eyes: Negative.   Respiratory: Negative.  Negative for shortness of breath.   Cardiovascular: Negative.  Negative for palpitations.  Gastrointestinal: Negative.   Endocrine: Negative.   Genitourinary: Negative.   Musculoskeletal: Negative.   Neurological: Negative.  Negative for headaches.  Hematological: Negative.   Psychiatric/Behavioral: Negative.   All other systems reviewed and are negative.      Objective:   Physical Exam  Constitutional: She is oriented to person, place, and time. She appears well-developed and well-nourished. No distress.  HENT:  Head: Normocephalic and atraumatic.  Eyes: Pupils are equal, round, and reactive to light.  Neck: Normal range of motion. Neck supple. No thyromegaly present.  Cardiovascular: Normal rate, regular rhythm, normal heart sounds and intact distal pulses.   No murmur heard. Pulmonary/Chest: Effort normal and breath sounds normal. No respiratory distress. She has no wheezes.  Abdominal: Soft. Bowel sounds are normal. She exhibits no distension. There is no tenderness.  Musculoskeletal: She exhibits no edema  or tenderness.  Unsteady gait, pt walking with walker  Neurological: She is alert and oriented to person, place, and time. She has normal reflexes. No cranial nerve deficit.  Skin: Skin is warm and dry.  Large Ecchymosis on right arm  Psychiatric: She has a normal mood and affect. Her behavior is normal. Judgment and thought content normal.  Vitals reviewed.   BP 176/65 mmHg  Pulse 63  Temp(Src) 98.2 F (36.8 C) (Oral)  Ht  (1.626 m)  Wt 206 lb 12.8 oz (93.804 kg)  BMI 35.48 kg/m2       Assessment & Plan:  1. Fall, initial encounter - Ambulatory referral to Physical Therapy  2. Unsteady gait - Ambulatory referral to Physical Therapy  Falls precaution discussed Pt to count to 5 seconds before moving  Exercising  discussed RTO as needed and keep chronic follow up   Jannifer Rodney, FNP

## 2015-08-10 NOTE — Patient Instructions (Signed)

## 2015-08-24 ENCOUNTER — Ambulatory Visit: Payer: Medicare Other | Attending: Family | Admitting: Physical Therapy

## 2015-08-24 DIAGNOSIS — R2681 Unsteadiness on feet: Secondary | ICD-10-CM | POA: Insufficient documentation

## 2015-08-24 NOTE — Therapy (Signed)
[facility] [address] [place], [place], [address] Phone: [phone]   Fax:  [phone]  Physical Therapy Evaluation  Patient Details  Name: Katherine Tyler MRN: [id number] Date of Birth: June 30, 1929 Referring Provider:  Junie Spencer, FNP  Encounter Date: 08/24/2015    Past Medical History  Diagnosis Date  . Hyperlipidemia   . Hypertension   . Arthritis   . Hiatal hernia   . Anxiety   . GERD (gastroesophageal reflux disease)   . Vitamin D deficiency   . Thyroid disease   . Diabetes mellitus without complication   . Obesity     No past surgical history on file.  There were no vitals filed for this visit.  Visit Diagnosis:  Unsteadiness - Plan: PT PLAN OF CARE CERT/RE-CERT      Subjective Assessment - 08/24/15 0952    Subjective I fall backward.   Patient Stated Goals Quit falling.            [facility] PT Assessment - 08/24/15 0001    Assessment   Medical Diagnosis Unsteady gait.   Onset Date/Surgical Date --  Several weeks.   Precautions   Precautions Fall  No ultrasound.   Precaution Comments Needs to use a walker at all times.   Restrictions   Weight Bearing Restrictions No   Balance Screen   Has the patient fallen in the past 6 months Yes   How many times? 4   Has the patient had a decrease in activity level because of a fear of falling?  No   Is the patient reluctant to leave their home because of a fear of falling?  No   Home [employer]ist information centre manager residence   Prior Function   Level of Independence Independent   Cognition   Overall Cognitive Status Within Functional Limits for tasks assessed   Observation/Other Assessments-Edema    Edema --   Circumferential Edema   Circumferential - Right --   Posture/Postural Control   Posture Comments Bilateral genu valgum.   ROM / Strength   AROM / PROM / Strength AROM;Strength   AROM   Overall AROM Comments WFL for LE's.   Strength    Overall Strength Comments Bilateral hip' knee and ankle strength= 4 to 4+/5.   Palpation   Palpation comment --   Ambulation/Gait   Gait Comments Patient walks safely with a standard walker with supervision.  Tried her with a FWW and she did very well.   Balance   Balance Assessed Yes   Standardized Balance Assessment   Standardized Balance Assessment Berg Balance Test;Timed Up and Go Test   Berg Balance Test   Sit to Stand Able to stand  independently using hands   Standing Unsupported Able to stand 2 minutes with supervision   Sitting with Back Unsupported but Feet Supported on Floor or Stool Able to sit safely and securely 2 minutes   Stand to Sit Controls descent by using hands   Transfers Able to transfer safely, definite need of hands   Standing Unsupported with Eyes Closed Needs help to keep from falling   Standing Ubsupported with Feet Together Needs help to attain position and unable to hold for 15 seconds   From Standing, Reach Forward with Outstretched Arm Can reach forward >5 cm safely (2")   From Standing Position, Pick up Object from Floor Able to pick up shoe, needs supervision   From Standing Position, Turn to Look Behind Over each Shoulder Looks  behind one side only/other side shows less weight shift   Turn 360 Degrees Able to turn 360 degrees safely but slowly   Standing Unsupported, Alternately Place Feet on Step/Stool Able to complete >2 steps/needs minimal assist   Standing Unsupported, One Foot in Front Needs help to step but can hold 15 seconds   Standing on One Leg Unable to try or needs assist to prevent fall   Total Score 28                             PT Short Term Goals - 08-27-15 1434    PT SHORT TERM GOAL #1   Title Ind with HEP.   Time 2   Period Weeks   Status New           PT Long Term Goals - 27-Aug-2015 1434    PT LONG TERM GOAL #1   Title Improve Berg score by 10 points.   Time 6   Period Weeks   Status New                Plan - August 27, 2015 1408    Clinical Impression Statement The patient reports 4 falls over the last 6 months.  She is now using a walker and has not fallen while using it.  She wants to improve her balance and avoid falling.   Pt will benefit from skilled therapeutic intervention in order to improve on the following deficits Pain;Decreased balance          G-Codes - Aug 27, 2015 1411    Functional Assessment Tool Used Clinical judgement.   Functional Limitation Mobility: Walking and moving around   Mobility: Walking and Moving Around Current Status [phone]) At least 60 percent but less than 80 percent impaired, limited or restricted   Mobility: Walking and Moving Around Goal Status [phone]) At least 40 percent but less than 60 percent impaired, limited or restricted       Problem List Patient Active Problem List   Diagnosis Date Noted  . GAD (generalized anxiety disorder) 05/03/2015  . Varicose veins 06/24/2013  . Seasonal allergic rhinitis 04/30/2013  . HTN (hypertension) 04/16/2013  . Obesity, unspecified 04/16/2013  . Vitamin D deficiency 04/16/2013  . Hypothyroidism 04/16/2013  . Genu valgum (acquired) 04/16/2013    Dona Walby, [place] MPT 08-27-15, 2:36 PM  [facility] [address] [place], [place], [address] Phone: [phone]   Fax:  [phone]

## 2015-08-29 ENCOUNTER — Encounter: Payer: Self-pay | Admitting: Physical Therapy

## 2015-08-29 ENCOUNTER — Ambulatory Visit: Payer: Medicare Other | Admitting: Physical Therapy

## 2015-08-29 DIAGNOSIS — R2681 Unsteadiness on feet: Secondary | ICD-10-CM | POA: Diagnosis not present

## 2015-08-29 NOTE — Therapy (Signed)
[facility] [place], [place], [address] Phone: [phone]   Fax:  [phone]  Physical Therapy Treatment  Patient Details  Name: Katherine Tyler MRN: [id number] Date of Birth: 1929/03/28 Referring Provider:  Sharion Balloon, FNP  Encounter Date: 08/29/2015      PT End of Session - 08/29/15 0946    Visit Number 2   Number of Visits 12   Date for PT Re-Evaluation 10/05/15   PT Start Time 0859   PT Stop Time 0946   PT Time Calculation (min) 47 min   Activity Tolerance Patient tolerated treatment well   Behavior During Therapy [facility] for tasks assessed/performed      Past Medical History  Diagnosis Date  . Hyperlipidemia   . Hypertension   . Arthritis   . Hiatal hernia   . Anxiety   . GERD (gastroesophageal reflux disease)   . Vitamin D deficiency   . Thyroid disease   . Diabetes mellitus without complication   . Obesity     History reviewed. No pertinent past surgical history.  There were no vitals filed for this visit.  Visit Diagnosis:  Unsteadiness      Subjective Assessment - 08/29/15 0904    Subjective no falls reported since before eval   Patient is accompained by: Family member   Patient Stated Goals Quit falling.   Currently in Pain? No/denies                         [facility] Adult PT Treatment/Exercise - 08/29/15 0001    Exercises   Exercises Lumbar;Knee/Hip   Knee/Hip Exercises: Aerobic   Nustep 27mn L3 UE/LE activity, monitored for progression             Balance Exercises - 08/29/15 0921    Balance Exercises: Standing   Standing Eyes Opened Narrow base of support (BOS);Wide (BOA);Foam/compliant surface;Time  356m on airex with SBA   Rockerboard Anterior/posterior  68m668m  Retro Gait Upper extremity support;5 reps  solid surface   Sidestepping Upper extremity support;5 reps  solid surface   Marching Limitations 2x10 with bil UE support   Heel Raises Limitations 2x10  with bil UE support   Sit to Stand Time x10 with uni to no UE support med height surface   Other Standing Exercises hip abd 2x10 with bil UE support   Overall Comments --  hip bridges x10           PT Education - 08/29/15 0916    Education provided Yes   Education Details HEP balance   Person(s) Educated Patient;Caregiver(s)   Methods Explanation;Demonstration;Handout   Comprehension Verbalized understanding;Returned demonstration          PT Short Term Goals - 08/29/15 0946    PT SHORT TERM GOAL #1   Title Ind with HEP.   Time 2   Period Weeks   Status Achieved           PT Long Term Goals - 08/29/15 0946    PT LONG TERM GOAL #1   Title Improve Berg score by 10 points.   Time 6   Period Weeks   Status On-going               Plan - 08/29/15 0946    Clinical Impression Statement Patient progressing with good understanding of fall prevention and initial HEP for balance. (Patient and family member) Patient was able to perform all balance activities today  with SBA and no LOB today. Met STG #1 and LTG ongoing due to balance dficits.   Pt will benefit from skilled therapeutic intervention in order to improve on the following deficits Pain;Decreased balance   PT Treatment/Interventions Neuromuscular re-education;Balance training;Therapeutic activities;Therapeutic exercise;Gait training   PT Next Visit Plan Balance program.   Consulted and Agree with Plan of Care Patient;Family member/caregiver        Problem List Patient Active Problem List   Diagnosis Date Noted  . GAD (generalized anxiety disorder) 05/03/2015  . Varicose veins 06/24/2013  . Seasonal allergic rhinitis 04/30/2013  . HTN (hypertension) 04/16/2013  . Obesity, unspecified 04/16/2013  . Vitamin D deficiency 04/16/2013  . Hypothyroidism 04/16/2013  . Genu valgum (acquired) 04/16/2013    DUNFORD, CHRISTINA P, PTA 08/29/2015, 9:49 AM  [facility] [place], [place], [address] Phone: [phone]   Fax:  [phone]

## 2015-08-29 NOTE — Patient Instructions (Signed)
  Toe-Up (Ankle Plantar Flexion and Dorsiflexion)   Holding a stable object, rise up on toes. Hold ____ seconds. Then rock back on heels and Hold 2____ seconds. Repeat _5-10___ times. Do _1-2___ sessions per day.  High Stepping   Using support, lift knees, taking high steps. Repeat __5-10__ times. Do __1-2__ sessions per day.  Walk to the Side   Step to the side with stronger leg and follow with involved leg. Then return. Hold chair if necessary. Repeat __5-10__ times. Do _1-2___ sessions per day.  .  Pelvic Tilt: Posterior - Legs Bent (Supine)  Bridging   Slowly raise buttocks from floor, keeping stomach tight. Repeat _10___ times per set. Do __2__ sets per session. Do __2__ sessions per day.   Sit to stand   Stand with feet shoulder width apart. Push buttocks backward and lower slowly, sitting in chair lightly and returning to standing position. Complete _2_ sets of 10_ repetitions. Perform __2-3_ sessions per day.

## 2015-08-31 ENCOUNTER — Ambulatory Visit: Payer: Medicare Other | Admitting: Physical Therapy

## 2015-08-31 ENCOUNTER — Encounter: Payer: Self-pay | Admitting: Physical Therapy

## 2015-08-31 DIAGNOSIS — R2681 Unsteadiness on feet: Secondary | ICD-10-CM | POA: Diagnosis not present

## 2015-08-31 NOTE — Therapy (Signed)
[facility] [address] [place], [place], [address] Phone: [phone]   Fax:  [phone]  Physical Therapy Treatment  Patient Details  Name: Katherine Tyler MRN: [id number] Date of Birth: [birth year] Referring Provider:  Junie Spencer, FNP  Encounter Date: 08/31/2015      PT End of Session - 08/31/15 0946    Visit Number 3   Number of Visits 12   Date for PT Re-Evaluation 10/05/15   PT Start Time 0901   PT Stop Time 0946   PT Time Calculation (min) 45 min   Activity Tolerance Patient tolerated treatment well   Behavior During Therapy [facility] for tasks assessed/performed      Past Medical History  Diagnosis Date  . Hyperlipidemia   . Hypertension   . Arthritis   . Hiatal hernia   . Anxiety   . GERD (gastroesophageal reflux disease)   . Vitamin D deficiency   . Thyroid disease   . Diabetes mellitus without complication   . Obesity     History reviewed. No pertinent past surgical history.  There were no vitals filed for this visit.  Visit Diagnosis:  Unsteadiness      Subjective Assessment - 08/31/15 0905    Subjective no falls reported since before eval and tolerated treatment well last visit   Patient is accompained by: Family member   Patient Stated Goals Quit falling.   Currently in Pain? No/denies                         [facility] Adult PT Treatment/Exercise - 08/31/15 0001    Knee/Hip Exercises: Aerobic   Nustep L4 UE/LE activity, monitored for progression             Balance Exercises - 08/31/15 0927    Balance Exercises: Standing   Standing Eyes Opened Wide (BOA);Foam/compliant surface  2# reahouts and D1/D2 with SBA   Standing Eyes Closed Wide (BOA);Solid surface;Head turns;5 reps;10 secs   Tandem Stance Eyes open;Intermittent upper extremity support;4 reps  tandem step   Rockerboard Anterior/posterior    Step Over Hurdles / Cones 5 cones step over and around x4   Sit to  Stand Time x10 sit to stand low surface with UE support   Other Standing Exercises toe taps 6'step 2x10 uni UE support             PT Short Term Goals - 08/29/15 0946    PT SHORT TERM GOAL #1   Title Ind with HEP.   Time 2   Period Weeks   Status Achieved           PT Long Term Goals - 08/29/15 0946    PT LONG TERM GOAL #1   Title Improve Berg score by 10 points.   Time 6   Period Weeks   Status On-going               Plan - 08/31/15 0947    Clinical Impression Statement Patient progressing with all activities. had no complaints today or after last treatment. Patient able to perform balance activities with none to very little LOB. Patient doing HEP at home per daughter. Goals ongoing due to balance deficits   Pt will benefit from skilled therapeutic intervention in order to improve on the following deficits Pain;Decreased balance   PT Treatment/Interventions Neuromuscular re-education;Balance training;Therapeutic activities;Therapeutic exercise;Gait training   PT Next Visit Plan Balance program.   Consulted and Agree with  Plan of Care Patient;Family member/caregiver        Problem List Patient Active Problem List   Diagnosis Date Noted  . GAD (generalized anxiety disorder) 05/03/2015  . Varicose veins 06/24/2013  . Seasonal allergic rhinitis 04/30/2013  . HTN (hypertension) 04/16/2013  . Obesity, unspecified 04/16/2013  . Vitamin D deficiency 04/16/2013  . Hypothyroidism 04/16/2013  . Genu valgum (acquired) 04/16/2013    DUNFORD, CHRISTINA P, PTA 08/31/2015, 9:51 AM  [facility] [address] [place], [place], [address] Phone: [phone]   Fax:  [phone]

## 2015-09-05 ENCOUNTER — Encounter: Payer: Self-pay | Admitting: Physical Therapy

## 2015-09-05 ENCOUNTER — Ambulatory Visit: Payer: Medicare Other | Admitting: Physical Therapy

## 2015-09-05 DIAGNOSIS — R2681 Unsteadiness on feet: Secondary | ICD-10-CM | POA: Diagnosis not present

## 2015-09-05 NOTE — Therapy (Signed)
[facility] [address] [place], [place], [address] Phone: [phone]   Fax:  [phone]  Physical Therapy Treatment  Patient Details  Name: Katherine Tyler MRN: [id number] Date of Birth: 1929/03/02 Referring Provider:  Junie Spencer, FNP  Encounter Date: 09/05/2015      PT End of Session - 09/05/15 1116    Visit Number 4   Number of Visits 12   Date for PT Re-Evaluation 10/05/15   PT Start Time 1030   PT Stop Time 1114   PT Time Calculation (min) 44 min   Activity Tolerance Patient tolerated treatment well   Behavior During Therapy [facility] for tasks assessed/performed      Past Medical History  Diagnosis Date  . Hyperlipidemia   . Hypertension   . Arthritis   . Hiatal hernia   . Anxiety   . GERD (gastroesophageal reflux disease)   . Vitamin D deficiency   . Thyroid disease   . Diabetes mellitus without complication   . Obesity     History reviewed. No pertinent past surgical history.  There were no vitals filed for this visit.  Visit Diagnosis:  Unsteadiness      Subjective Assessment - 09/05/15 1034    Subjective no falls reported since before eval and tolerated treatment well last visit   Patient is accompained by: Family member   Patient Stated Goals Quit falling.   Currently in Pain? No/denies            [facility] PT Assessment - 09/05/15 0001    Berg Balance Test   Sit to Stand Able to stand  independently using hands   Standing Unsupported Able to stand 2 minutes with supervision   Sitting with Back Unsupported but Feet Supported on Floor or Stool Able to sit safely and securely 2 minutes   Stand to Sit Controls descent by using hands   Transfers Able to transfer safely, definite need of hands   Standing Unsupported with Eyes Closed Able to stand 10 seconds with supervision   Standing Ubsupported with Feet Together Able to place feet together independently and stand for 1 minute with supervision   From  Standing, Reach Forward with Outstretched Arm Can reach forward >5 cm safely (2")   From Standing Position, Pick up Object from Floor Able to pick up shoe, needs supervision   From Standing Position, Turn to Look Behind Over each Shoulder Looks behind one side only/other side shows less weight shift   Turn 360 Degrees Able to turn 360 degrees safely but slowly   Standing Unsupported, Alternately Place Feet on Step/Stool Able to complete >2 steps/needs minimal assist   Standing Unsupported, One Foot in Front Needs help to step but can hold 15 seconds   Standing on One Leg Unable to try or needs assist to prevent fall   Total Score 34                     [facility] Adult PT Treatment/Exercise - 09/05/15 0001    Knee/Hip Exercises: Aerobic   Nustep L4 UE/LE activity, monitored for progression             Balance Exercises - 09/05/15 1052    Balance Exercises: Standing   Standing Eyes Opened Wide (BOA);Foam/compliant surface  3 min balance then 2# reachouts and D1/D2 SBA 2x10 each   Rockerboard Anterior/posterior    Step Ups 6 inch;Forward;UE support 1  2x10   Sit to Stand Time x10  sit to stand low surface with UE support             PT Short Term Goals - 08/29/15 0946    PT SHORT TERM GOAL #1   Title Ind with HEP.   Time 2   Period Weeks   Status Achieved           PT Long Term Goals - 09/05/15 1117    PT LONG TERM GOAL #1   Title Improve Berg score by 10 points.   Time 6   Period Weeks   Status On-going  improved by 6 points               Plan - 09/05/15 1118    Clinical Impression Statement Patient progressing with all balance and  strength activities. Patient able to perform balance activites with less LOB and able to perform step ups and standing balance with SBA. Patient improved BERG score by 6 points. Goals progressing yet ongoing due to balance deficits.    Pt will benefit from skilled therapeutic intervention in order to  improve on the following deficits Pain;Decreased balance   PT Treatment/Interventions Neuromuscular re-education;Balance training;Therapeutic activities;Therapeutic exercise;Gait training   PT Next Visit Plan Balance program.   Consulted and Agree with Plan of Care Patient        Problem List Patient Active Problem List   Diagnosis Date Noted  . GAD (generalized anxiety disorder) 05/03/2015  . Varicose veins 06/24/2013  . Seasonal allergic rhinitis 04/30/2013  . HTN (hypertension) 04/16/2013  . Obesity, unspecified 04/16/2013  . Vitamin D deficiency 04/16/2013  . Hypothyroidism 04/16/2013  . Genu valgum (acquired) 04/16/2013    DUNFORD, CHRISTINA P, PTA 09/05/2015, 11:22 AM  [facility] [address] [place], [place], [address] Phone: [phone]   Fax:  [phone]

## 2015-09-07 ENCOUNTER — Encounter: Payer: Self-pay | Admitting: Physical Therapy

## 2015-09-07 ENCOUNTER — Ambulatory Visit: Payer: Medicare Other | Admitting: Physical Therapy

## 2015-09-07 DIAGNOSIS — R2681 Unsteadiness on feet: Secondary | ICD-10-CM

## 2015-09-07 NOTE — Therapy (Signed)
[facility] [address] [place], [place], [address] Phone: [phone]   Fax:  [phone]  Physical Therapy Treatment  Patient Details  Name: Katherine Tyler MRN: [id number] Date of Birth: 1929-08-20 Referring Provider:  Junie Spencer, FNP  Encounter Date: 09/07/2015      PT End of Session - 09/07/15 1110    Visit Number 5   Number of Visits 12   Date for PT Re-Evaluation 10/05/15   PT Start Time 1028   PT Stop Time 1111   PT Time Calculation (min) 43 min   Activity Tolerance Patient tolerated treatment well   Behavior During Therapy [facility] for tasks assessed/performed      Past Medical History  Diagnosis Date  . Hyperlipidemia   . Hypertension   . Arthritis   . Hiatal hernia   . Anxiety   . GERD (gastroesophageal reflux disease)   . Vitamin D deficiency   . Thyroid disease   . Diabetes mellitus without complication   . Obesity     History reviewed. No pertinent past surgical history.  There were no vitals filed for this visit.  Visit Diagnosis:  Unsteadiness      Subjective Assessment - 09/07/15 1033    Subjective no falls reported since before eval and tolerated treatment well last visit   Patient is accompained by: Family member   Patient Stated Goals Quit falling.   Currently in Pain? No/denies                         [facility] Adult PT Treatment/Exercise - 09/07/15 0001    Knee/Hip Exercises: Aerobic   Nustep L4 UE/LE activity, monitored for progression             Balance Exercises - 09/07/15 1043    Balance Exercises: Standing   Standing Eyes Opened Wide (BOA);Foam/compliant surface  x47min balance then 2# reachouts and D1/D2 2x10 each   Rockerboard Anterior/posterior    Step Ups 6 inch;Forward;UE support 1  2x10   Tandem Gait Forward;Upper extremity support;Foam/compliant surface  2 x5   Sidestepping Foam/compliant support;Upper extremity support  2x5   Marching  Limitations 2x10 with uni UE support             PT Short Term Goals - 08/29/15 0946    PT SHORT TERM GOAL #1   Title Ind with HEP.   Time 2   Period Weeks   Status Achieved           PT Long Term Goals - 09/05/15 1117    PT LONG TERM GOAL #1   Title Improve Berg score by 10 points.   Time 6   Period Weeks   Status On-going  improved by 6 points               Plan - 09/07/15 1111    Clinical Impression Statement Patient progressing with all activities. Patient progressing with balance activities this week with good contol and able to perform most activities with uni UE suppor and SBA. Some difficulty with step ups and retro walking on foam surface.Patient has reported no falls or LOB. Goals ongoing due to balance deficits.   Pt will benefit from skilled therapeutic intervention in order to improve on the following deficits Pain;Decreased balance   PT Treatment/Interventions Neuromuscular re-education;Balance training;Therapeutic activities;Therapeutic exercise;Gait training   PT Next Visit Plan Balance program.   Consulted and Agree with Plan of Care Patient;Family member/caregiver  Problem List Patient Active Problem List   Diagnosis Date Noted  . GAD (generalized anxiety disorder) 05/03/2015  . Varicose veins 06/24/2013  . Seasonal allergic rhinitis 04/30/2013  . HTN (hypertension) 04/16/2013  . Obesity, unspecified 04/16/2013  . Vitamin D deficiency 04/16/2013  . Hypothyroidism 04/16/2013  . Genu valgum (acquired) 04/16/2013    DUNFORD, CHRISTINA P, PTA 09/07/2015, 11:14 AM  [facility] [address] [place], [place], [address] Phone: [phone]   Fax:  [phone]

## 2015-09-12 ENCOUNTER — Ambulatory Visit (INDEPENDENT_AMBULATORY_CARE_PROVIDER_SITE_OTHER): Payer: Medicare Other | Admitting: Family

## 2015-09-12 ENCOUNTER — Encounter: Payer: Self-pay | Admitting: Family

## 2015-09-12 VITALS — BP 177/64 | HR 58 | Temp 97.1°F | Ht 64.0 in | Wt 204.8 lb

## 2015-09-12 DIAGNOSIS — E039 Hypothyroidism, unspecified: Secondary | ICD-10-CM | POA: Diagnosis not present

## 2015-09-12 DIAGNOSIS — F411 Generalized anxiety disorder: Secondary | ICD-10-CM | POA: Diagnosis not present

## 2015-09-12 DIAGNOSIS — Z23 Encounter for immunization: Secondary | ICD-10-CM | POA: Diagnosis not present

## 2015-09-12 DIAGNOSIS — I10 Essential (primary) hypertension: Secondary | ICD-10-CM | POA: Diagnosis not present

## 2015-09-12 DIAGNOSIS — R7309 Other abnormal glucose: Secondary | ICD-10-CM

## 2015-09-12 DIAGNOSIS — E8881 Metabolic syndrome: Secondary | ICD-10-CM | POA: Diagnosis not present

## 2015-09-12 DIAGNOSIS — R739 Hyperglycemia, unspecified: Secondary | ICD-10-CM

## 2015-09-12 DIAGNOSIS — E669 Obesity, unspecified: Secondary | ICD-10-CM | POA: Diagnosis not present

## 2015-09-12 DIAGNOSIS — J302 Other seasonal allergic rhinitis: Secondary | ICD-10-CM | POA: Diagnosis not present

## 2015-09-12 DIAGNOSIS — E559 Vitamin D deficiency, unspecified: Secondary | ICD-10-CM | POA: Diagnosis not present

## 2015-09-12 LAB — POCT GLYCOSYLATED HEMOGLOBIN (HGB A1C): Hemoglobin A1C: 5.3

## 2015-09-12 MED ORDER — CHLORTHALIDONE 25 MG PO TABS: 12.5000 mg | ORAL_TABLET | Freq: Every day | ORAL | Status: DC

## 2015-09-12 NOTE — Progress Notes (Signed)
Subjective:    Patient ID: Katherine Tyler, female    DOB: [birth year], 81 y.o.   MRN: [id number]  Pt presents to the office today for chronic follow up.  Hypertension This is a chronic problem. The current episode started more than 1 year ago. The problem has been waxing and waning since onset. The problem is uncontrolled. Associated symptoms include anxiety. Pertinent negatives include no headaches, palpitations, peripheral edema, shortness of breath or sweats. Risk factors for coronary artery disease include family history, obesity, post-menopausal state and sedentary lifestyle. Past treatments include angiotensin blockers, diuretics, direct vasodilators, calcium channel blockers and beta blockers. The current treatment provides mild improvement. Hypertensive end-organ damage includes kidney disease and a thyroid problem. There is no history of CAD/MI, CVA or heart failure.  Thyroid Problem Presents for follow-up visit. Symptoms include anxiety. Patient reports no depressed mood, diarrhea, heat intolerance, menstrual problem, palpitations or visual change. The symptoms have been stable. Past treatments include levothyroxine. The treatment provided significant relief. There is no history of heart failure.  Anxiety Presents for follow-up visit. Symptoms include excessive worry and nervous/anxious behavior. Patient reports no confusion, depressed mood, insomnia, palpitations or shortness of breath. Symptoms occur rarely. The severity of symptoms is mild. The symptoms are aggravated by family issues.   Her past medical history is significant for anxiety/panic attacks. There is no history of CAD or depression. Past treatments include SSRIs. The treatment provided significant relief. Compliance with prior treatments has been good.      Review of Systems  Constitutional: Negative.   HENT: Negative.   Eyes: Negative.   Respiratory: Negative.  Negative for shortness of breath.   Cardiovascular: Negative.   Negative for palpitations.  Gastrointestinal: Negative.  Negative for diarrhea.  Endocrine: Negative.  Negative for heat intolerance.  Genitourinary: Negative.  Negative for menstrual problem.  Musculoskeletal: Negative.   Neurological: Negative.  Negative for headaches.  Hematological: Negative.   Psychiatric/Behavioral: Negative for confusion. The patient is nervous/anxious. The patient does not have insomnia.   All other systems reviewed and are negative.      Objective:   Physical Exam  Constitutional: She is oriented to person, place, and time. She appears well-developed and well-nourished. No distress.  HENT:  Head: Normocephalic and atraumatic.  Right Ear: External ear normal.  Left Ear: External ear normal.  Nose: Nose normal.  Mouth/Throat: Oropharynx is clear and moist.  Eyes: Pupils are equal, round, and reactive to light.  Neck: Normal range of motion. Neck supple. No thyromegaly present.  Cardiovascular: Normal rate, regular rhythm, normal heart sounds and intact distal pulses.   No murmur heard. Pulmonary/Chest: Effort normal and breath sounds normal. No respiratory distress. She has no wheezes.  Abdominal: Soft. Bowel sounds are normal. She exhibits no distension. There is no tenderness.  Musculoskeletal: Normal range of motion. She exhibits no edema or tenderness.  Neurological: She is alert and oriented to person, place, and time. She has normal reflexes. No cranial nerve deficit.  Skin: Skin is warm and dry.  Psychiatric: She has a normal mood and affect. Her behavior is normal. Judgment and thought content normal.  Vitals reviewed.     BP 190/63 mmHg  Pulse 58  Temp(Src) 97.1 F (36.2 C) (Oral)  Ht _0  (1.626 m)  Wt 204 lb 12.8 oz (92.897 kg)  BMI 35.14 kg/m2     Assessment & Plan:  1. Essential hypertension - chlorthalidone (HYGROTON) 25 MG tablet; Take 0.5 tablets (12.5 mg total) by mouth daily.  Dispense: 90 tablet; Refill: 0 - CMP14+EGFR  2.  Seasonal allergic rhinitis - CMP14+EGFR  3. Hypothyroidism, unspecified hypothyroidism type - CMP14+EGFR - Thyroid Panel With TSH  4. Obesity, unspecified - CMP14+EGFR  5. Vitamin D deficiency - CMP14+EGFR  6. GAD (generalized anxiety disorder) - GJG87+VXBO  7. Metabolic syndrome - ZWR04+BTVD - POCT glycosylated hemoglobin (Hb A1C)  8. Obesity (BMI 30-39.9) - CMP14+EGFR  9. Elevated blood sugar - POCT glycosylated hemoglobin (Hb A1C)   Continue all meds Labs pending Health Maintenance reviewed Diet and exercise encouraged RTO 4 months  Evelina Dun, FNP

## 2015-09-12 NOTE — Patient Instructions (Signed)

## 2015-09-13 ENCOUNTER — Encounter: Payer: Self-pay | Admitting: *Deleted

## 2015-09-13 ENCOUNTER — Ambulatory Visit: Payer: Medicare Other | Attending: Family | Admitting: *Deleted

## 2015-09-13 DIAGNOSIS — R2681 Unsteadiness on feet: Secondary | ICD-10-CM | POA: Diagnosis not present

## 2015-09-13 LAB — CMP14+EGFR
ALBUMIN: 4 g/dL (ref 3.5–4.7)
ALT: 16 IU/L (ref 0–32)
AST: 19 IU/L (ref 0–40)
Albumin/Globulin Ratio: 1.4 (ref 1.1–2.5)
Alkaline Phosphatase: 62 IU/L (ref 39–117)
BUN / CREAT RATIO: 24 (ref 11–26)
BUN: 26 mg/dL (ref 8–27)
Bilirubin Total: 0.4 mg/dL (ref 0.0–1.2)
CO2: 26 mmol/L (ref 18–29)
CREATININE: 1.09 mg/dL — AB (ref 0.57–1.00)
Calcium: 9.5 mg/dL (ref 8.7–10.3)
Chloride: 89 mmol/L — ABNORMAL LOW (ref 97–108)
GFR, EST AFRICAN AMERICAN: 54 mL/min/{1.73_m2} — AB (ref >59)
GFR, EST NON AFRICAN AMERICAN: 47 mL/min/{1.73_m2} — AB (ref >59)
GLOBULIN, TOTAL: 2.8 g/dL (ref 1.5–4.5)
GLUCOSE: 81 mg/dL (ref 65–99)
Potassium: 4.2 mmol/L (ref 3.5–5.2)
SODIUM: 131 mmol/L — AB (ref 134–144)
TOTAL PROTEIN: 6.8 g/dL (ref 6.0–8.5)

## 2015-09-13 LAB — THYROID PANEL WITH TSH
FREE THYROXINE INDEX: 3 (ref 1.2–4.9)
T3 Uptake Ratio: 29 % (ref 24–39)
T4, Total: 10.4 ug/dL (ref 4.5–12.0)
TSH: 0.578 u[IU]/mL (ref 0.450–4.500)

## 2015-09-13 NOTE — Therapy (Signed)
[facility] [address] [place], [place], [address] Phone: [phone]   Fax:  [phone]  Physical Therapy Treatment  Patient Details  Name: Katherine Tyler MRN: [id number] Date of Birth: 1929/10/07 Referring Provider:  Junie Spencer, FNP  Encounter Date: 09/13/2015      PT End of Session - 09/13/15 0932    Visit Number 6   Number of Visits 12   Date for PT Re-Evaluation 10/05/15   PT Start Time 0900   PT Stop Time 0950   PT Time Calculation (min) 50 min      Past Medical History  Diagnosis Date  . Hyperlipidemia   . Hypertension   . Arthritis   . Hiatal hernia   . Anxiety   . GERD (gastroesophageal reflux disease)   . Vitamin D deficiency   . Thyroid disease   . Diabetes mellitus without complication (HCC)   . Obesity     History reviewed. No pertinent past surgical history.  There were no vitals filed for this visit.  Visit Diagnosis:  Unsteadiness      Subjective Assessment - 09/13/15 0908    Subjective no falls reported since before eval and tolerated treatment well last visit   Patient is accompained by: Family member   Patient Stated Goals Quit falling.   Currently in Pain? No/denies                         [facility] Adult PT Treatment/Exercise - 09/13/15 0001    Exercises   Exercises Lumbar;Knee/Hip   Knee/Hip Exercises: Aerobic   Nustep L5 UE/LE activity, monitored for progression             Balance Exercises - 09/13/15 0918    Balance Exercises: Standing   Standing Eyes Opened Wide (BOA);Foam/compliant surface   manual rhythmic stab also   Standing Eyes Closed Wide (BOA);Solid surface;Head turns;5 reps;10 secs   Rockerboard Anterior/posterior   Step Ups 6 inch;Forward;UE support 1   Tandem Gait Forward;Upper extremity support;Foam/compliant surface   Sidestepping Foam/compliant support;Upper extremity support   Marching Limitations 2x10 with uni UE support   Sit to  Stand Time x10 sit to stand low surface with UE support   Other Standing Exercises toe taps 6'step 2x10 uni UE support             PT Short Term Goals - 08/29/15 0946    PT SHORT TERM GOAL #1   Title Ind with HEP.   Time 2   Period Weeks   Status Achieved           PT Long Term Goals - 09/05/15 1117    PT LONG TERM GOAL #1   Title Improve Berg score by 10 points.   Time 6   Period Weeks   Status On-going  improved by 6 points               Plan - 09/13/15 0932    Clinical Impression Statement Pt did great with balance exs/ Acts today and progresses the longer we do them. She still stands and ambulates with a wide BOS due to fear of falling. Her caregiver says that she does ambulate around the house sometimes without her walker. No falls over the weekend . Goals are ongoing   Pt will benefit from skilled therapeutic intervention in order to improve on the following deficits Pain;Decreased balance   PT Treatment/Interventions Neuromuscular re-education;Balance training;Therapeutic activities;Therapeutic exercise;Gait training  PT Next Visit Plan Balance program.   Consulted and Agree with Plan of Care Patient        Problem List Patient Active Problem List   Diagnosis Date Noted  . Metabolic syndrome 09/12/2015  . Obesity (BMI 30-39.9) 09/12/2015  . GAD (generalized anxiety disorder) 05/03/2015  . Varicose veins 06/24/2013  . Seasonal allergic rhinitis 04/30/2013  . HTN (hypertension) 04/16/2013  . Obesity, unspecified 04/16/2013  . Vitamin D deficiency 04/16/2013  . Hypothyroidism 04/16/2013  . Genu valgum (acquired) 04/16/2013    RAMSEUR,CHRIS, PTA 09/13/2015, 9:52 AM  [facility] [address] [place], [place], [address] Phone: [phone]   Fax:  [phone]

## 2015-09-15 ENCOUNTER — Encounter: Payer: Self-pay | Admitting: *Deleted

## 2015-09-15 ENCOUNTER — Ambulatory Visit: Payer: Medicare Other | Admitting: *Deleted

## 2015-09-15 DIAGNOSIS — R2681 Unsteadiness on feet: Secondary | ICD-10-CM

## 2015-09-15 NOTE — Therapy (Signed)
[facility] [address] [place], [place], [address] Phone: [phone]   Fax:  [phone]  Physical Therapy Treatment  Patient Details  Name: Katherine Tyler MRN: [id number] Date of Birth: 27-Jul-1929 Referring Provider:  Junie Spencer, FNP  Encounter Date: 09/15/2015      PT End of Session - 09/15/15 0918    Visit Number 7   Number of Visits 12   Date for PT Re-Evaluation 10/05/15   PT Start Time 0900   PT Stop Time 0950   PT Time Calculation (min) 50 min      Past Medical History  Diagnosis Date  . Hyperlipidemia   . Hypertension   . Arthritis   . Hiatal hernia   . Anxiety   . GERD (gastroesophageal reflux disease)   . Vitamin D deficiency   . Thyroid disease   . Diabetes mellitus without complication (HCC)   . Obesity     History reviewed. No pertinent past surgical history.  There were no vitals filed for this visit.  Visit Diagnosis:  Unsteadiness      Subjective Assessment - 09/15/15 0916    Subjective no falls reported since before eval and tolerated treatment well last visit, RT knee hurts some today   Patient Stated Goals Quit falling.                         [facility] Adult PT Treatment/Exercise - 09/15/15 0001    Exercises   Exercises Lumbar;Knee/Hip   Knee/Hip Exercises: Aerobic   Nustep L5 UE/LE activity, monitored for progression             Balance Exercises - 09/15/15 0948    Balance Exercises: Standing   Standing Eyes Opened Wide (BOA);Foam/compliant surface   Standing Eyes Closed Wide (BOA);Solid surface;Head turns;5 reps;10 secs   Rockerboard Anterior/posterior   Step Ups 6 inch;Forward;UE support 1   Tandem Gait Forward;Upper extremity support;Foam/compliant surface   Sidestepping Foam/compliant support;Upper extremity support   Marching Limitations 2x10 with uni UE support   Sit to Stand Time x10 sit to stand low surface with UE support   Other Standing Exercises  toe taps 6'step 2x10 uni UE support                                                                                                    Diagonal reaches D1/D2 with both UEs on and off balance pad        PT Short Term Goals - 08/29/15 0946    PT SHORT TERM GOAL #1   Title Ind with HEP.   Time 2   Period Weeks   Status Achieved           PT Long Term Goals - 09/05/15 1117    PT LONG TERM GOAL #1   Title Improve Berg score by 10 points.   Time 6   Period Weeks   Status On-going  improved by 6 points               Plan - 09/15/15  1610    Clinical Impression Statement Pt did well again today and was able to perform better with eyes open and closed. - Romberg. She still needs UE assist from sit to stand and SBA/CGA during balance Exs. Goals are ongoing   Pt will benefit from skilled therapeutic intervention in order to improve on the following deficits Pain;Decreased balance   PT Treatment/Interventions Neuromuscular re-education;Balance training;Therapeutic activities;Therapeutic exercise;Gait training   PT Next Visit Plan Balance program.   Consulted and Agree with Plan of Care Patient        Problem List Patient Active Problem List   Diagnosis Date Noted  . Metabolic syndrome 09/12/2015  . Obesity (BMI 30-39.9) 09/12/2015  . GAD (generalized anxiety disorder) 05/03/2015  . Varicose veins 06/24/2013  . Seasonal allergic rhinitis 04/30/2013  . HTN (hypertension) 04/16/2013  . Obesity, unspecified 04/16/2013  . Vitamin D deficiency 04/16/2013  . Hypothyroidism 04/16/2013  . Genu valgum (acquired) 04/16/2013    Allyssia Skluzacek,CHRIS, PTA 09/15/2015, 10:09 AM  [facility] [address] [place], [place], [address] Phone: [phone]   Fax:  [phone]

## 2015-09-19 ENCOUNTER — Encounter: Payer: Self-pay | Admitting: Physical Therapy

## 2015-09-19 ENCOUNTER — Ambulatory Visit: Payer: Medicare Other | Admitting: Physical Therapy

## 2015-09-19 DIAGNOSIS — R2681 Unsteadiness on feet: Secondary | ICD-10-CM | POA: Diagnosis not present

## 2015-09-19 NOTE — Therapy (Signed)
[facility] [address] [place], [place], [address] Phone: [phone]   Fax:  [phone]  Physical Therapy Treatment  Patient Details  Name: Katherine Tyler MRN: [id number] Date of Birth: 07-07-1929 Referring Provider:  Junie Spencer, FNP  Encounter Date: 09/19/2015      PT End of Session - 09/19/15 0940    Visit Number 8   Number of Visits 12   Date for PT Re-Evaluation 10/05/15   PT Start Time 0859   PT Stop Time 0941   PT Time Calculation (min) 42 min   Activity Tolerance Patient tolerated treatment well   Behavior During Therapy [facility] for tasks assessed/performed      Past Medical History  Diagnosis Date  . Hyperlipidemia   . Hypertension   . Arthritis   . Hiatal hernia   . Anxiety   . GERD (gastroesophageal reflux disease)   . Vitamin D deficiency   . Thyroid disease   . Diabetes mellitus without complication (HCC)   . Obesity     History reviewed. No pertinent past surgical history.  There were no vitals filed for this visit.  Visit Diagnosis:  Unsteadiness      Subjective Assessment - 09/19/15 0907    Subjective no falls reported since before eval and tolerated treatment well last visit   Patient is accompained by: Family member   Patient Stated Goals Quit falling.   Currently in Pain? No/denies                         [facility] Adult PT Treatment/Exercise - 09/19/15 0001    Lumbar Exercises: Supine   Bridge 20 reps   Knee/Hip Exercises: Aerobic   Nustep L5 UE/LE activity, monitored for progression             Balance Exercises - 09/19/15 0908    Balance Exercises: Standing   Standing Eyes Opened Wide (BOA);Foam/compliant surface   Standing Eyes Closed Wide (BOA);Solid surface;Head turns;5 reps;10 secs   Rockerboard Anterior/posterior    Step Ups 6 inch;Forward;UE support 1   Sidestepping Foam/compliant support;Upper extremity support   Sit to Stand Time x10 sit to  stand low surface with UE support   Other Standing Exercises toe taps 6'step 2x10 uni UE support             PT Short Term Goals - 08/29/15 0946    PT SHORT TERM GOAL #1   Title Ind with HEP.   Time 2   Period Weeks   Status Achieved           PT Long Term Goals - 09/05/15 1117    PT LONG TERM GOAL #1   Title Improve Berg score by 10 points.   Time 6   Period Weeks   Status On-going  improved by 6 points               Plan - 09/19/15 0940    Clinical Impression Statement Patient continues to progress, has reported no falls or LOB and feels she is able to perform ADL's with improvement. Patient able to perform all balance activities with no LOB. Patient progressing with Balance goal ongoing at this time.    Pt will benefit from skilled therapeutic intervention in order to improve on the following deficits Pain;Decreased balance   PT Treatment/Interventions Neuromuscular re-education;Balance training;Therapeutic activities;Therapeutic exercise;Gait training   PT Next Visit Plan Balance program.   Consulted and Agree with Plan  of Care Patient;Family member/caregiver        Problem List Patient Active Problem List   Diagnosis Date Noted  . Metabolic syndrome 09/12/2015  . Obesity (BMI 30-39.9) 09/12/2015  . GAD (generalized anxiety disorder) 05/03/2015  . Varicose veins 06/24/2013  . Seasonal allergic rhinitis 04/30/2013  . HTN (hypertension) 04/16/2013  . Obesity, unspecified 04/16/2013  . Vitamin D deficiency 04/16/2013  . Hypothyroidism 04/16/2013  . Genu valgum (acquired) 04/16/2013    Kynedi Profitt P, PTA 09/19/2015, 9:42 AM  [facility] [address] [place], [place], [address] Phone: [phone]   Fax:  [phone]

## 2015-09-21 ENCOUNTER — Ambulatory Visit: Payer: Medicare Other | Admitting: Physical Therapy

## 2015-09-21 ENCOUNTER — Encounter: Payer: Self-pay | Admitting: Physical Therapy

## 2015-09-21 DIAGNOSIS — R2681 Unsteadiness on feet: Secondary | ICD-10-CM

## 2015-09-21 NOTE — Therapy (Signed)
[facility] [address] [place], [place], [address] Phone: [phone]   Fax:  [phone]  Physical Therapy Treatment  Patient Details  Name: Katherine IslamMary Tyler MRN: [id number] Date of Birth: 06/09/1929 Referring Provider:  Junie SpencerHawks, Christy A, FNP  Encounter Date: 09/21/2015      PT End of Session - 09/21/15 0904    Visit Number 9   Number of Visits 12   Date for PT Re-Evaluation 10/05/15   PT Start Time 0901   PT Stop Time 0945   PT Time Calculation (min) 44 min   Activity Tolerance Patient tolerated treatment well   Behavior [facility] Therapy [facility] for tasks assessed/performed      Past Medical History  Diagnosis Date  . Hyperlipidemia   . Hypertension   . Arthritis   . Hiatal hernia   . Anxiety   . GERD (gastroesophageal reflux disease)   . Vitamin D deficiency   . Thyroid disease   . Diabetes mellitus without complication (HCC)   . Obesity     No past surgical history on file.  There were no vitals filed for this visit.  Visit Diagnosis:  Unsteadiness      Subjective Assessment - 09/21/15 0903    Subjective No falls reports and has been using walker [facility].   Patient is accompained by: Family member   Patient Stated Goals Quit falling.   Currently in Pain? Yes   Pain Score 5    Pain Location Knee   Pain Orientation Right   Pain Descriptors / Indicators Sore            [facility] PT Assessment - 09/21/15 0001    Assessment   Medical Diagnosis Unsteady gait.                     [facility] Adult PT Treatment/Exercise - 09/21/15 0001    Knee/Hip Exercises: Aerobic   Nustep L6 x2618min UE/LE with monitoring   Knee/Hip Exercises: Seated   Sit to Sand 20 reps;without UE support   Knee/Hip Exercises: Supine   Bridges Limitations x20 reps             Balance Exercises - 09/21/15 0950    Balance Exercises: Standing   Standing Eyes Opened Wide (BOA);Foam/compliant surface  x3 min    Standing Eyes Closed Wide (BOA);Foam/compliant surface  x3 min   Rockerboard Anterior/posterior  x3 min   Step Ups 6 inch;UE support 1  x20 reps BLE   Tandem Gait Forward;Upper extremity support;Foam/compliant surface  x4 reps   Sidestepping Foam/compliant support;Upper extremity support  x3 reps   Other Standing Exercises toe taps 6'step 2x10 uni UE support             PT Short Term Goals - 08/29/15 0946    PT SHORT TERM GOAL #1   Title Ind with HEP.   Time 2   Period Weeks   Status Achieved           PT Long Term Goals - 09/05/15 1117    PT LONG TERM GOAL #1   Title Improve Berg score by 10 points.   Time 6   Period Weeks   Status On-going  improved by 6 points               Plan - 09/21/15 0951    Clinical Impression Statement Patient continues to do well during PT treatments and continues to reports no falls at home since previous  treatment. Performed balance exercises with 2 episodes of LOB backwards during eyes closed on airex but PTA had CGA during all balance exercise and Min A +1 required for patient to recover balance. All balance exercises were completed today with CGA and sit to stands were completed with close supervision. Required VCing to stand erect during sit/stands. Eyes closed balance exercise appeared to be most difficult for patient today. Patient denied R knee pain following today's treatment.   Pt will benefit from skilled therapeutic intervention in order to improve on the following deficits Pain;Decreased balance   PT Treatment/Interventions Neuromuscular re-education;Balance training;Therapeutic activities;Therapeutic exercise;Gait training   PT Next Visit Plan Balance program.   Consulted and Agree with Plan of Care Patient;Family member/caregiver        Problem List Patient Active Problem List   Diagnosis Date Noted  . Metabolic syndrome 09/12/2015  . Obesity (BMI 30-39.9) 09/12/2015  . GAD (generalized anxiety disorder)  05/03/2015  . Varicose veins 06/24/2013  . Seasonal allergic rhinitis 04/30/2013  . HTN (hypertension) 04/16/2013  . Obesity, unspecified 04/16/2013  . Vitamin D deficiency 04/16/2013  . Hypothyroidism 04/16/2013  . Genu valgum (acquired) 04/16/2013    Evelene Croon, PTA 09/21/2015, 10:31 AM  [facility] [address] [place], [place], [address] Phone: [phone]   Fax:  [phone]

## 2015-09-26 ENCOUNTER — Telehealth: Payer: Self-pay | Admitting: Family

## 2015-09-26 ENCOUNTER — Ambulatory Visit: Payer: Medicare Other | Admitting: Physical Therapy

## 2015-09-26 DIAGNOSIS — R2681 Unsteadiness on feet: Secondary | ICD-10-CM

## 2015-09-26 NOTE — Therapy (Signed)
[facility] [place], [place], [address] Phone: [phone]   Fax:  [phone]  Physical Therapy Treatment  Patient Details  Name: Katherine Tyler MRN: [id number] Date of Birth: 03-12-1929 No Data Recorded  Encounter Date: 09/26/2015      PT End of Session - 09/26/15 0903    Visit Number 10   Number of Visits 12   Date for PT Re-Evaluation 10/05/15   PT Start Time 0903   PT Stop Time 0948   PT Time Calculation (min) 45 min   Activity Tolerance Patient tolerated treatment well   Behavior During Therapy [facility] for tasks assessed/performed      Past Medical History  Diagnosis Date  . Hyperlipidemia   . Hypertension   . Arthritis   . Hiatal hernia   . Anxiety   . GERD (gastroesophageal reflux disease)   . Vitamin D deficiency   . Thyroid disease   . Diabetes mellitus without complication ([place])   . Obesity     No past surgical history on file.  There were no vitals filed for this visit.  Visit Diagnosis:  Unsteadiness          [facility] PT Assessment - 09/26/15 0001    Assessment   Medical Diagnosis Unsteady gait.   Berg Balance Test   Sit to Stand Able to stand without using hands and stabilize independently   Standing Unsupported Able to stand safely 2 minutes   Sitting with Back Unsupported but Feet Supported on Floor or Stool Able to sit safely and securely 2 minutes   Stand to Sit Sits safely with minimal use of hands   Transfers Able to transfer safely, definite need of hands   Standing Unsupported with Eyes Closed Able to stand 10 seconds safely   Standing Ubsupported with Feet Together Able to place feet together independently and stand 1 minute safely   From Standing, Reach Forward with Outstretched Arm Can reach forward >12 cm safely (5")   From Standing Position, Pick up Object from [place] to pick up shoe safely and easily   From Standing Position, Turn to Look Behind Over each Shoulder Looks behind  one side only/other side shows less weight shift  LOB to left   Turn 360 Degrees Able to turn 360 degrees safely but slowly   Standing Unsupported, Alternately Place Feet on Step/Stool Able to complete >2 steps/needs minimal assist  once she starts able to complete with supervision   Standing Unsupported, One Foot in Front Able to take small step independently and hold 30 seconds   Standing on One Leg Unable to try or needs assist to prevent fall   Total Score 42                     [facility] Adult PT Treatment/Exercise - 09/26/15 0001    Knee/Hip Exercises: Aerobic   Nustep L6 x59mn UE/LE with monitoring             Balance Exercises - 09/26/15 0950    Balance Exercises: Standing   Standing Eyes Opened Wide (BOA);Foam/compliant surface  x3 min with added head turns and looking up   Standing Eyes Closed Wide (BOA);Foam/compliant surface  x2 min   SLS Eyes open;Upper extremity support 1;15 secs  x2 bil   Marching Limitations 2x10 with uni UE support   Other Standing Exercises toes on foam standing x 2 min with looking up and head turns x 2 min  PT Short Term Goals - Oct 01, 2015 1121    PT SHORT TERM GOAL #1   Title Ind with HEP.   Time 2   Period Weeks   Status Achieved           PT Long Term Goals - 10-01-2015 1121    PT LONG TERM GOAL #1   Title Improve Berg score by 10 points.   Time 6   Period Weeks   Status Achieved               Plan - 01-Oct-2015 1121    Clinical Impression Statement Patient has met her LTG of improving BERG by 10 points. She still has difficulty with LOB on compliant surfaces (2x backward) and she continues to demo weakness with hip flexion L>R.    Pt will benefit from skilled therapeutic intervention in order to improve on the following deficits Pain;Decreased balance   PT Treatment/Interventions Neuromuscular re-education;Balance training;Therapeutic activities;Therapeutic exercise;Gait training   PT Next  Visit Plan Continue balance activities for 2 more visits, then d/c to HEP   Consulted and Agree with Plan of Care Patient;Family member/caregiver          G-Codes - 01-Oct-2015 1128    Functional Assessment Tool Used Clinical judgement.   Functional Limitation Mobility: Walking and moving around   Mobility: Walking and Moving Around Current Status [phone]) At least 40 percent but less than 60 percent impaired, limited or restricted   Mobility: Walking and Moving Around Goal Status [phone]) At least 40 percent but less than 60 percent impaired, limited or restricted      Problem List Patient Active Problem List   Diagnosis Date Noted  . Metabolic syndrome 56/38/9373  . Obesity (BMI 30-39.9) 09/12/2015  . GAD (generalized anxiety disorder) 05/03/2015  . Varicose veins 06/24/2013  . Seasonal allergic rhinitis 04/30/2013  . HTN (hypertension) 04/16/2013  . Obesity, unspecified 04/16/2013  . Vitamin D deficiency 04/16/2013  . Hypothyroidism 04/16/2013  . Genu valgum (acquired) 04/16/2013   Katherine Tyler PT  01-Oct-2015, 11:32 AM  [facility] [place], [place], [address] Phone: [phone]   Fax:  [phone]  Name: Katherine Tyler MRN: [id number] Date of Birth: 1929-02-22

## 2015-09-28 ENCOUNTER — Encounter: Payer: Self-pay | Admitting: Physical Therapy

## 2015-09-28 ENCOUNTER — Ambulatory Visit: Payer: Medicare Other | Admitting: Physical Therapy

## 2015-09-28 DIAGNOSIS — R2681 Unsteadiness on feet: Secondary | ICD-10-CM

## 2015-09-28 NOTE — Therapy (Signed)
[facility] [address] [place], [place], [address] Phone: [phone]   Fax:  [phone]  Physical Therapy Treatment  Patient Details  Name: Katherine Tyler MRN: [id number] Date of Birth: [birth year] No Data Recorded  Encounter Date: 09/28/2015      PT End of Session - 09/28/15 0901    Visit Number 11   Number of Visits 12   Date for PT Re-Evaluation 10/05/15   PT Start Time 0900   PT Stop Time 0943   PT Time Calculation (min) 43 min   Activity Tolerance Patient tolerated treatment well   Behavior During Therapy [facility] for tasks assessed/performed      Past Medical History  Diagnosis Date  . Hyperlipidemia   . Hypertension   . Arthritis   . Hiatal hernia   . Anxiety   . GERD (gastroesophageal reflux disease)   . Vitamin D deficiency   . Thyroid disease   . Diabetes mellitus without complication (HCC)   . Obesity     No past surgical history on file.  There were no vitals filed for this visit.  Visit Diagnosis:  Unsteadiness      Subjective Assessment - 09/28/15 0901    Subjective Reports feeling stiff this morning. Reports doing exercises this morning.   Patient is accompained by: Family member   Patient Stated Goals Quit falling.            [facility] PT Assessment - 09/28/15 0001    Assessment   Medical Diagnosis Unsteady gait.   Precautions   Precautions Fall   Precaution Comments Needs to use a walker at all times.                     [facility] Adult PT Treatment/Exercise - 09/28/15 0001    Knee/Hip Exercises: Aerobic   Nustep L6 x11min UE/LE with monitoring   Knee/Hip Exercises: Seated   Long Arc Quad Strengthening;Both;3 sets;10 reps;Weights   Long Arc Quad Weight 3 lbs.   Sit to Sand 20 reps;without UE support             Balance Exercises - 09/28/15 0943    Balance Exercises: Standing   Standing Eyes Opened Wide (BOA);Foam/compliant surface;Time  x3 min with head turns and look up   Standing Eyes Closed Wide (BOA);Foam/compliant surface  x3 min   Tandem Gait Forward;Intermittent upper extremity support;Foam/compliant surface  x10 reps   Marching Limitations x2 min on airex with one UE support   Other Standing Exercises Toes on edge of airex x4 min with head turns and look up; B toe/heel raises x30 reps             PT Short Term Goals - 09/26/15 1121    PT SHORT TERM GOAL #1   Title Ind with HEP.   Time 2   Period Weeks   Status Achieved           PT Long Term Goals - 09/26/15 1121    PT LONG TERM GOAL #1   Title Improve Berg score by 10 points.   Time 6   Period Weeks   Status Achieved               Plan - 09/28/15 0945    Clinical Impression Statement Patient tolerated today's treatment well and had no complaints during today's treatment. Did very well with low sit to stands on mat table with no UE support. Demonstrated minimal posterior instabiltiy during balance activities although  close supervision and CGA were utilized during all balance exercises. Did well with head turns and looking up with balance exercises on a compliant surface. Experienced decreased soreness following today's treatment.   Pt will benefit from skilled therapeutic intervention in order to improve on the following deficits Pain;Decreased balance   PT Treatment/Interventions Neuromuscular re-education;Balance training;Therapeutic activities;Therapeutic exercise;Gait training   PT Next Visit Plan Continue balance activities for 2 more visits, then d/c to HEP   Consulted and Agree with Plan of Care Patient;Family member/caregiver        Problem List Patient Active Problem List   Diagnosis Date Noted  . Metabolic syndrome 09/12/2015  . Obesity (BMI 30-39.9) 09/12/2015  . GAD (generalized anxiety disorder) 05/03/2015  . Varicose veins 06/24/2013  . Seasonal allergic rhinitis 04/30/2013  . HTN (hypertension) 04/16/2013  . Obesity, unspecified 04/16/2013  . Vitamin  D deficiency 04/16/2013  . Hypothyroidism 04/16/2013  . Genu valgum (acquired) 04/16/2013    Evelene CroonKelsey M Parsons, PTA 09/28/2015, 9:54 AM  [facility] [address] [place], [place], [address] Phone: [phone]   Fax:  [phone]  Name: Katherine Tyler MRN: [id number] Date of Birth: 07/16/1929

## 2015-10-03 ENCOUNTER — Ambulatory Visit: Payer: Medicare Other | Admitting: Physical Therapy

## 2015-10-03 ENCOUNTER — Encounter: Payer: Self-pay | Admitting: Physical Therapy

## 2015-10-03 DIAGNOSIS — R2681 Unsteadiness on feet: Secondary | ICD-10-CM

## 2015-10-03 NOTE — Therapy (Signed)
[facility] [place], [place], [address] Phone: [phone]   Fax:  [phone]  Physical Therapy Treatment  Patient Details  Name: Katherine Tyler MRN: [id number] Date of Birth: 1929/12/06 No Data Recorded  Encounter Date: 10/03/2015      PT End of Session - 10/03/15 0905    Visit Number 12   Number of Visits 12   Date for PT Re-Evaluation 10/05/15   PT Start Time 0901   PT Stop Time 0943   PT Time Calculation (min) 42 min   Activity Tolerance Patient tolerated treatment well   Behavior During Therapy [facility] for tasks assessed/performed      Past Medical History  Diagnosis Date  . Hyperlipidemia   . Hypertension   . Arthritis   . Hiatal hernia   . Anxiety   . GERD (gastroesophageal reflux disease)   . Vitamin D deficiency   . Thyroid disease   . Diabetes mellitus without complication ([place])   . Obesity     No past surgical history on file.  There were no vitals filed for this visit.  Visit Diagnosis:  Unsteadiness      Subjective Assessment - 10/03/15 0904    Subjective No new complaints and reports no falls since before her evaluation.   Patient is accompained by: Family member   Patient Stated Goals Quit falling.   Currently in Pain? No/denies            [facility] PT Assessment - 10/03/15 0001    Assessment   Medical Diagnosis Unsteady gait.   Precautions   Precautions Fall   Precaution Comments Needs to use a walker at all times.   Berg Balance Test   Sit to Stand Able to stand without using hands and stabilize independently   Standing Unsupported Able to stand safely 2 minutes   Sitting with Back Unsupported but Feet Supported on Floor or Stool Able to sit safely and securely 2 minutes   Stand to Sit Sits safely with minimal use of hands   Transfers Able to transfer safely, definite need of hands   Standing Unsupported with Eyes Closed Able to stand 10 seconds safely   Standing Ubsupported with  Feet Together Able to place feet together independently and stand 1 minute safely   From Standing, Reach Forward with Outstretched Arm Can reach forward >12 cm safely (5")   From Standing Position, Pick up Object from [place] to pick up shoe safely and easily   From Standing Position, Turn to Look Behind Over each Shoulder Looks behind from both sides and weight shifts well   Turn 360 Degrees Able to turn 360 degrees safely but slowly   Standing Unsupported, Alternately Place Feet on Step/Stool Able to complete >2 steps/needs minimal assist  Supervision and 1 UE support   Standing Unsupported, One Foot in Front Able to plae foot ahead of the other independently and hold 30 seconds   Standing on One Leg Unable to try or needs assist to prevent fall   Total Score 44                     [facility] Adult PT Treatment/Exercise - 10/03/15 0001    Knee/Hip Exercises: Aerobic   Nustep L6 x15 min UE/LE with monitoring             Balance Exercises - 10/03/15 0936    Balance Exercises: Standing   Other Standing Exercises Toes on edge of airex x3  min with head turns and look up; church pew exercise x20 reps             PT Short Term Goals - 09/26/15 1121    PT SHORT TERM GOAL #1   Title Ind with HEP.   Time 2   Period Weeks   Status Achieved           PT Long Term Goals - 10/03/15 0947    PT LONG TERM GOAL #1   Title Improve Berg score by 10 points.   Time 6   Period Weeks   Status Achieved  44 points currently.               Plan - 10/03/15 0944    Clinical Impression Statement Patient has progressed well with PT regarding her balance. Has achieved all LT goals set at evaluation at this time. Original BERG score was 28 points and is 44 points as of 10/03/2015. Requires some use of UE when sitting on low chair and was encourage to continue FWW use at home. Close supervision was utilized during all balance exercises and BERG testing.   Pt will benefit  from skilled therapeutic intervention in order to improve on the following deficits Pain;Decreased balance   PT Treatment/Interventions Neuromuscular re-education;Balance training;Therapeutic activities;Therapeutic exercise;Gait training   PT Next Visit Plan Communicate to MPT of need for D/C note.   Consulted and Agree with Plan of Care Patient;Family member/caregiver        Problem List Patient Active Problem List   Diagnosis Date Noted  . Metabolic syndrome 09/12/2015  . Obesity (BMI 30-39.9) 09/12/2015  . GAD (generalized anxiety disorder) 05/03/2015  . Varicose veins 06/24/2013  . Seasonal allergic rhinitis 04/30/2013  . HTN (hypertension) 04/16/2013  . Obesity, unspecified 04/16/2013  . Vitamin D deficiency 04/16/2013  . Hypothyroidism 04/16/2013  . Genu valgum (acquired) 04/16/2013    Florence CannerKelsey Parsons, PTA 10/03/2015 9:48 AM  [facility] [address] [place], [place], [address] Phone: [phone]   Fax:  [phone]  Name: Katherine IslamMary Tyler MRN: [id number] Date of Birth: 01/08/1929

## 2015-10-03 NOTE — Therapy (Addendum)
[facility] [place], [place], [address] Phone: [phone]   Fax:  [phone]  Physical Therapy Treatment  Patient Details  Name: Katherine Tyler MRN: [id number] Date of Birth: 1929/12/06 No Data Recorded  Encounter Date: 10/03/2015      PT End of Session - 10/03/15 0905    Visit Number 12   Number of Visits 12   Date for PT Re-Evaluation 10/05/15   PT Start Time 0901   PT Stop Time 0943   PT Time Calculation (min) 42 min   Activity Tolerance Patient tolerated treatment well   Behavior During Therapy [facility] for tasks assessed/performed      Past Medical History  Diagnosis Date  . Hyperlipidemia   . Hypertension   . Arthritis   . Hiatal hernia   . Anxiety   . GERD (gastroesophageal reflux disease)   . Vitamin D deficiency   . Thyroid disease   . Diabetes mellitus without complication ([place])   . Obesity     No past surgical history on file.  There were no vitals filed for this visit.  Visit Diagnosis:  Unsteadiness      Subjective Assessment - 10/03/15 0904    Subjective No new complaints and reports no falls since before her evaluation.   Patient is accompained by: Family member   Patient Stated Goals Quit falling.   Currently in Pain? No/denies            [facility] PT Assessment - 10/03/15 0001    Assessment   Medical Diagnosis Unsteady gait.   Precautions   Precautions Fall   Precaution Comments Needs to use a walker at all times.   Berg Balance Test   Sit to Stand Able to stand without using hands and stabilize independently   Standing Unsupported Able to stand safely 2 minutes   Sitting with Back Unsupported but Feet Supported on Floor or Stool Able to sit safely and securely 2 minutes   Stand to Sit Sits safely with minimal use of hands   Transfers Able to transfer safely, definite need of hands   Standing Unsupported with Eyes Closed Able to stand 10 seconds safely   Standing Ubsupported with  Feet Together Able to place feet together independently and stand 1 minute safely   From Standing, Reach Forward with Outstretched Arm Can reach forward >12 cm safely (5")   From Standing Position, Pick up Object from [place] to pick up shoe safely and easily   From Standing Position, Turn to Look Behind Over each Shoulder Looks behind from both sides and weight shifts well   Turn 360 Degrees Able to turn 360 degrees safely but slowly   Standing Unsupported, Alternately Place Feet on Step/Stool Able to complete >2 steps/needs minimal assist  Supervision and 1 UE support   Standing Unsupported, One Foot in Front Able to plae foot ahead of the other independently and hold 30 seconds   Standing on One Leg Unable to try or needs assist to prevent fall   Total Score 44                     [facility] Adult PT Treatment/Exercise - 10/03/15 0001    Knee/Hip Exercises: Aerobic   Nustep L6 x15 min UE/LE with monitoring             Balance Exercises - 10/03/15 0936    Balance Exercises: Standing   Other Standing Exercises Toes on edge of airex x3  min with head turns and look up; church pew exercise x20 reps             PT Short Term Goals - 09/26/15 1121    PT SHORT TERM GOAL #1   Title Ind with HEP.   Time 2   Period Weeks   Status Achieved           PT Long Term Goals - 10-23-2015 0947    PT LONG TERM GOAL #1   Title Improve Berg score by 10 points.   Time 6   Period Weeks   Status Achieved  44 points currently.               Plan - 10-23-2015 0944    Clinical Impression Statement Patient has progressed well with PT regarding her balance. Has achieved all LT goals set at evaluation at this time. Original BERG score was 28 points and is 44 points as of 10/23/15. Requires some use of UE when sitting on low chair and was encourage to continue FWW use at home. Close supervision was utilized during all balance exercises and BERG testing.   Pt will benefit  from skilled therapeutic intervention in order to improve on the following deficits Pain;Decreased balance   PT Treatment/Interventions Neuromuscular re-education;Balance training;Therapeutic activities;Therapeutic exercise;Gait training   PT Next Visit Plan Communicate to MPT of need for D/C note.   Consulted and Agree with Plan of Care Patient;Family member/caregiver          G-Codes - Oct 23, 2015 1756    Functional Assessment Tool Used Clinical judgement.   Functional Limitation Mobility: Walking and moving around   Mobility: Walking and Moving Around Current Status [phone]) At least 40 percent but less than 60 percent impaired, limited or restricted   Mobility: Walking and Moving Around Goal Status [phone]) At least 40 percent but less than 60 percent impaired, limited or restricted   Mobility: Walking and Moving Around Discharge Status [phone]) At least 40 percent but less than 60 percent impaired, limited or restricted      Problem List Patient Active Problem List   Diagnosis Date Noted  . Metabolic syndrome 93/26/7124  . Obesity (BMI 30-39.9) 09/12/2015  . GAD (generalized anxiety disorder) 05/03/2015  . Varicose veins 06/24/2013  . Seasonal allergic rhinitis 04/30/2013  . HTN (hypertension) 04/16/2013  . Obesity, unspecified 04/16/2013  . Vitamin D deficiency 04/16/2013  . Hypothyroidism 04/16/2013  . Genu valgum (acquired) 04/16/2013   PHYSICAL THERAPY DISCHARGE SUMMARY  Visits from Start of Care: 12  Current functional level related to goals / functional outcomes: Goals met.   Remaining deficits: Goals met.   Education / Equipment: HEP. Plan: Patient agrees to discharge.  Patient goals were met. Patient is being discharged due to meeting the stated rehab goals.  ?????       Sheilyn Boehlke, [place] MPT 23-Oct-2015, 5:56 PM  [facility] [address] [place], [place], [address] Phone: [phone]   Fax:   [phone]  Name: Katherine Tyler MRN: [id number] Date of Birth: 05-21-1929

## 2016-01-05 ENCOUNTER — Other Ambulatory Visit: Payer: Medicare Other

## 2016-01-05 DIAGNOSIS — N183 Chronic kidney disease, stage 3 unspecified: Secondary | ICD-10-CM

## 2016-01-05 DIAGNOSIS — I10 Essential (primary) hypertension: Secondary | ICD-10-CM | POA: Diagnosis not present

## 2016-01-05 DIAGNOSIS — E559 Vitamin D deficiency, unspecified: Secondary | ICD-10-CM | POA: Diagnosis not present

## 2016-01-05 DIAGNOSIS — R809 Proteinuria, unspecified: Secondary | ICD-10-CM

## 2016-01-05 DIAGNOSIS — Z79899 Other long term (current) drug therapy: Secondary | ICD-10-CM

## 2016-01-05 DIAGNOSIS — D509 Iron deficiency anemia, unspecified: Secondary | ICD-10-CM

## 2016-01-06 LAB — RENAL FUNCTION PANEL
Albumin: 4 g/dL (ref 3.5–4.7)
BUN / CREAT RATIO: 21 (ref 11–26)
BUN: 23 mg/dL (ref 8–27)
CALCIUM: 9.4 mg/dL (ref 8.7–10.3)
CHLORIDE: 91 mmol/L — AB (ref 96–106)
CO2: 26 mmol/L (ref 18–29)
Creatinine, Ser: 1.07 mg/dL — ABNORMAL HIGH (ref 0.57–1.00)
GFR calc non Af Amer: 48 mL/min/{1.73_m2} — ABNORMAL LOW (ref >59)
GFR, EST AFRICAN AMERICAN: 55 mL/min/{1.73_m2} — AB (ref >59)
Glucose: 94 mg/dL (ref 65–99)
Phosphorus: 3.2 mg/dL (ref 2.5–4.5)
Potassium: 4 mmol/L (ref 3.5–5.2)
SODIUM: 134 mmol/L (ref 134–144)

## 2016-01-06 LAB — PARATHYROID HORMONE, INTACT (NO CA): PTH: 21 pg/mL (ref 15–65)

## 2016-01-06 LAB — PROTEIN / CREATININE RATIO, URINE
Creatinine, Urine: 33.2 mg/dL
PROTEIN/CREAT RATIO: 669 mg/g{creat} — AB (ref 0–200)
Protein, Ur: 22.2 mg/dL

## 2016-01-06 LAB — IRON AND TIBC
IRON SATURATION: 16 % (ref 15–55)
Iron: 53 ug/dL (ref 27–139)
Total Iron Binding Capacity: 322 ug/dL (ref 250–450)
UIBC: 269 ug/dL (ref 118–369)

## 2016-01-06 LAB — FERRITIN: FERRITIN: 77 ng/mL (ref 15–150)

## 2016-01-06 LAB — HEMOGLOBIN: HEMOGLOBIN: 12.2 g/dL (ref 11.1–15.9)

## 2016-01-06 LAB — HEMATOCRIT: HEMATOCRIT: 36 % (ref 34.0–46.6)

## 2016-01-06 LAB — VITAMIN D 25 HYDROXY (VIT D DEFICIENCY, FRACTURES): VIT D 25 HYDROXY: 34.8 ng/mL (ref 30.0–100.0)

## 2016-01-16 ENCOUNTER — Encounter: Payer: Self-pay | Admitting: Family

## 2016-01-16 ENCOUNTER — Ambulatory Visit (INDEPENDENT_AMBULATORY_CARE_PROVIDER_SITE_OTHER): Payer: Medicare Other | Admitting: Family

## 2016-01-16 VITALS — BP 174/61 | HR 55 | Temp 97.4°F | Ht 64.0 in | Wt 215.0 lb

## 2016-01-16 DIAGNOSIS — I868 Varicose veins of other specified sites: Secondary | ICD-10-CM

## 2016-01-16 DIAGNOSIS — E039 Hypothyroidism, unspecified: Secondary | ICD-10-CM

## 2016-01-16 DIAGNOSIS — F411 Generalized anxiety disorder: Secondary | ICD-10-CM

## 2016-01-16 DIAGNOSIS — E8881 Metabolic syndrome: Secondary | ICD-10-CM

## 2016-01-16 DIAGNOSIS — E559 Vitamin D deficiency, unspecified: Secondary | ICD-10-CM

## 2016-01-16 DIAGNOSIS — J302 Other seasonal allergic rhinitis: Secondary | ICD-10-CM | POA: Diagnosis not present

## 2016-01-16 DIAGNOSIS — I8390 Asymptomatic varicose veins of unspecified lower extremity: Secondary | ICD-10-CM

## 2016-01-16 DIAGNOSIS — I10 Essential (primary) hypertension: Secondary | ICD-10-CM

## 2016-01-16 DIAGNOSIS — Z23 Encounter for immunization: Secondary | ICD-10-CM

## 2016-01-16 DIAGNOSIS — E669 Obesity, unspecified: Secondary | ICD-10-CM | POA: Diagnosis not present

## 2016-01-16 NOTE — Patient Instructions (Signed)
Health Maintenance, Female Adopting a healthy lifestyle and getting preventive care can go a long way to promote health and wellness. Talk with your health care provider about what schedule of regular examinations is right for you. This is a good chance for you to check in with your provider about disease prevention and staying healthy. In between checkups, there are plenty of things you can do on your own. Experts have done a lot of research about which lifestyle changes and preventive measures are most likely to keep you healthy. Ask your health care provider for more information. WEIGHT AND DIET  Eat a healthy diet  Be sure to include plenty of vegetables, fruits, low-fat dairy products, and lean protein.  Do not eat a lot of foods high in solid fats, added sugars, or salt.  Get regular exercise. This is one of the most important things you can do for your health.  Most adults should exercise for at least 150 minutes each week. The exercise should increase your heart rate and make you sweat (moderate-intensity exercise).  Most adults should also do strengthening exercises at least twice a week. This is in addition to the moderate-intensity exercise.  Maintain a healthy weight  Body mass index (BMI) is a measurement that can be used to identify possible weight problems. It estimates body fat based on height and weight. Your health care provider can help determine your BMI and help you achieve or maintain a healthy weight.  For females 20 years of age and older:   A BMI below 18.5 is considered underweight.  A BMI of 18.5 to 24.9 is normal.  A BMI of 25 to 29.9 is considered overweight.  A BMI of 30 and above is considered obese.  Watch levels of cholesterol and blood lipids  You should start having your blood tested for lipids and cholesterol at 80 years of age, then have this test every 5 years.  You may need to have your cholesterol levels checked more often if:  Your lipid  or cholesterol levels are high.  You are older than 80 years of age.  You are at high risk for heart disease.  CANCER SCREENING   Lung Cancer  Lung cancer screening is recommended for adults 55-80 years old who are at high risk for lung cancer because of a history of smoking.  A yearly low-dose CT scan of the lungs is recommended for people who:  Currently smoke.  Have quit within the past 15 years.  Have at least a 30-pack-year history of smoking. A pack year is smoking an average of one pack of cigarettes a day for 1 year.  Yearly screening should continue until it has been 15 years since you quit.  Yearly screening should stop if you develop a health problem that would prevent you from having lung cancer treatment.  Breast Cancer  Practice breast self-awareness. This means understanding how your breasts normally appear and feel.  It also means doing regular breast self-exams. Let your health care provider know about any changes, no matter how small.  If you are in your 20s or 30s, you should have a clinical breast exam (CBE) by a health care provider every 1-3 years as part of a regular health exam.  If you are 40 or older, have a CBE every year. Also consider having a breast X-ray (mammogram) every year.  If you have a family history of breast cancer, talk to your health care provider about genetic screening.  If you   are at high risk for breast cancer, talk to your health care provider about having an MRI and a mammogram every year.  Breast cancer gene (BRCA) assessment is recommended for women who have family members with BRCA-related cancers. BRCA-related cancers include:  Breast.  Ovarian.  Tubal.  Peritoneal cancers.  Results of the assessment will determine the need for genetic counseling and BRCA1 and BRCA2 testing. Cervical Cancer Your health care provider may recommend that you be screened regularly for cancer of the pelvic organs (ovaries, uterus, and  vagina). This screening involves a pelvic examination, including checking for microscopic changes to the surface of your cervix (Pap test). You may be encouraged to have this screening done every 3 years, beginning at age 21.  For women ages 30-65, health care providers may recommend pelvic exams and Pap testing every 3 years, or they may recommend the Pap and pelvic exam, combined with testing for human papilloma virus (HPV), every 5 years. Some types of HPV increase your risk of cervical cancer. Testing for HPV may also be done on women of any age with unclear Pap test results.  Other health care providers may not recommend any screening for nonpregnant women who are considered low risk for pelvic cancer and who do not have symptoms. Ask your health care provider if a screening pelvic exam is right for you.  If you have had past treatment for cervical cancer or a condition that could lead to cancer, you need Pap tests and screening for cancer for at least 20 years after your treatment. If Pap tests have been discontinued, your risk factors (such as having a new sexual partner) need to be reassessed to determine if screening should resume. Some women have medical problems that increase the chance of getting cervical cancer. In these cases, your health care provider may recommend more frequent screening and Pap tests. Colorectal Cancer  This type of cancer can be detected and often prevented.  Routine colorectal cancer screening usually begins at 80 years of age and continues through 80 years of age.  Your health care provider may recommend screening at an earlier age if you have risk factors for colon cancer.  Your health care provider may also recommend using home test kits to check for hidden blood in the stool.  A small camera at the end of a tube can be used to examine your colon directly (sigmoidoscopy or colonoscopy). This is done to check for the earliest forms of colorectal  cancer.  Routine screening usually begins at age 50.  Direct examination of the colon should be repeated every 5-10 years through 80 years of age. However, you may need to be screened more often if early forms of precancerous polyps or small growths are found. Skin Cancer  Check your skin from head to toe regularly.  Tell your health care provider about any new moles or changes in moles, especially if there is a change in a mole's shape or color.  Also tell your health care provider if you have a mole that is larger than the size of a pencil eraser.  Always use sunscreen. Apply sunscreen liberally and repeatedly throughout the day.  Protect yourself by wearing long sleeves, pants, a wide-brimmed hat, and sunglasses whenever you are outside. HEART DISEASE, DIABETES, AND HIGH BLOOD PRESSURE   High blood pressure causes heart disease and increases the risk of stroke. High blood pressure is more likely to develop in:  People who have blood pressure in the high end   of the normal range (130-139/85-89 mm Hg).  People who are overweight or obese.  People who are African American.  If you are 38-23 years of age, have your blood pressure checked every 3-5 years. If you are 61 years of age or older, have your blood pressure checked every year. You should have your blood pressure measured twice--once when you are at a hospital or clinic, and once when you are not at a hospital or clinic. Record the average of the two measurements. To check your blood pressure when you are not at a hospital or clinic, you can use:  An automated blood pressure machine at a pharmacy.  A home blood pressure monitor.  If you are between 45 years and 39 years old, ask your health care provider if you should take aspirin to prevent strokes.  Have regular diabetes screenings. This involves taking a blood sample to check your fasting blood sugar level.  If you are at a normal weight and have a low risk for diabetes,  have this test once every three years after 80 years of age.  If you are overweight and have a high risk for diabetes, consider being tested at a younger age or more often. PREVENTING INFECTION  Hepatitis B  If you have a higher risk for hepatitis B, you should be screened for this virus. You are considered at high risk for hepatitis B if:  You were born in a country where hepatitis B is common. Ask your health care provider which countries are considered high risk.  Your parents were born in a high-risk country, and you have not been immunized against hepatitis B (hepatitis B vaccine).  You have HIV or AIDS.  You use needles to inject street drugs.  You live with someone who has hepatitis B.  You have had sex with someone who has hepatitis B.  You get hemodialysis treatment.  You take certain medicines for conditions, including cancer, organ transplantation, and autoimmune conditions. Hepatitis C  Blood testing is recommended for:  Everyone born from [birth year] through 1963.  Anyone with known risk factors for hepatitis C. Sexually transmitted infections (STIs)  You should be screened for sexually transmitted infections (STIs) including gonorrhea and chlamydia if:  You are sexually active and are younger than 80 years of age.  You are older than 80 years of age and your health care provider tells you that you are at risk for this type of infection.  Your sexual activity has changed since you were last screened and you are at an increased risk for chlamydia or gonorrhea. Ask your health care provider if you are at risk.  If you do not have HIV, but are at risk, it may be recommended that you take a prescription medicine daily to prevent HIV infection. This is called pre-exposure prophylaxis (PrEP). You are considered at risk if:  You are sexually active and do not regularly use condoms or know the HIV status of your partner(s).  You take drugs by injection.  You are sexually  active with a partner who has HIV. Talk with your health care provider about whether you are at high risk of being infected with HIV. If you choose to begin PrEP, you should first be tested for HIV. You should then be tested every 3 months for as long as you are taking PrEP.  PREGNANCY   If you are premenopausal and you may become pregnant, ask your health care provider about preconception counseling.  If you may  become pregnant, take 400 to 800 micrograms (mcg) of folic acid every day.  If you want to prevent pregnancy, talk to your health care provider about birth control (contraception). OSTEOPOROSIS AND MENOPAUSE   Osteoporosis is a disease in which the bones lose minerals and strength with aging. This can result in serious bone fractures. Your risk for osteoporosis can be identified using a bone density scan.  If you are 61 years of age or older, or if you are at risk for osteoporosis and fractures, ask your health care provider if you should be screened.  Ask your health care provider whether you should take a calcium or vitamin D supplement to lower your risk for osteoporosis.  Menopause may have certain physical symptoms and risks.  Hormone replacement therapy may reduce some of these symptoms and risks. Talk to your health care provider about whether hormone replacement therapy is right for you.  HOME CARE INSTRUCTIONS   Schedule regular health, dental, and eye exams.  Stay current with your immunizations.   Do not use any tobacco products including cigarettes, chewing tobacco, or electronic cigarettes.  If you are pregnant, do not drink alcohol.  If you are breastfeeding, limit how much and how often you drink alcohol.  Limit alcohol intake to no more than 1 drink per day for nonpregnant women. One drink equals 12 ounces of beer, 5 ounces of wine, or 1 ounces of hard liquor.  Do not use street drugs.  Do not share needles.  Ask your health care provider for help if  you need support or information about quitting drugs.  Tell your health care provider if you often feel depressed.  Tell your health care provider if you have ever been abused or do not feel safe at home.   This information is not intended to replace advice given to you by your health care provider. Make sure you discuss any questions you have with your health care provider.   Document Released: 06/11/2011 Document Revised: 12/17/2014 Document Reviewed: 10/28/2013 Elsevier Interactive Patient Education [employer].

## 2016-01-16 NOTE — Progress Notes (Signed)
Subjective:    Patient ID: Katherine Tyler, female    DOB: [birth year], 82 y.o.   MRN: [id number]  Pt presents to the office today for chronic follow up. PT is followed by nephrologists every 6 months for uncontrolled HTN. [name] BP is still not at goal today.  Hypertension This is a chronic problem. The current episode started more than 1 year ago. The problem has been waxing and waning since onset. The problem is uncontrolled. Associated symptoms include anxiety. Pertinent negatives include no headaches, palpitations, peripheral edema, shortness of breath or sweats. Risk factors for coronary artery disease include family history, obesity, post-menopausal state and sedentary lifestyle. Past treatments include angiotensin blockers, diuretics, direct vasodilators, calcium channel blockers and beta blockers. The current treatment provides mild improvement. Hypertensive end-organ damage includes kidney disease and a thyroid problem. There is no history of CAD/MI, CVA or heart failure.  Thyroid Problem Presents for follow-up visit. Symptoms include anxiety. Patient reports no depressed mood, diarrhea, heat intolerance, menstrual problem, palpitations or visual change. The symptoms have been stable. Past treatments include levothyroxine. The treatment provided significant relief. There is no history of heart failure.  Anxiety Presents for follow-up visit. Onset was more than 5 years ago. The problem has been waxing and waning. Symptoms include excessive worry and nervous/anxious behavior. Patient reports no confusion, depressed mood, insomnia, palpitations or shortness of breath. Symptoms occur rarely. The severity of symptoms is mild. The symptoms are aggravated by family issues.   Her past medical history is significant for anxiety/panic attacks. There is no history of CAD or depression. Past treatments include SSRIs. The treatment provided significant relief. Compliance with prior treatments has been good.       Review of Systems  Constitutional: Negative.   HENT: Negative.   Eyes: Negative.   Respiratory: Negative.  Negative for shortness of breath.   Cardiovascular: Negative.  Negative for palpitations.  Gastrointestinal: Negative.  Negative for diarrhea.  Endocrine: Negative.  Negative for heat intolerance.  Genitourinary: Negative.  Negative for menstrual problem.  Musculoskeletal: Negative.   Neurological: Negative.  Negative for headaches.  Hematological: Negative.   Psychiatric/Behavioral: Negative for confusion. The patient is nervous/anxious. The patient does not have insomnia.   All other systems reviewed and are negative.      Objective:   Physical Exam  Constitutional: She is oriented to person, place, and time. She appears well-developed and well-nourished. No distress.  HENT:  Head: Normocephalic and atraumatic.  Right Ear: External ear normal.  Left Ear: External ear normal.  Nose: Nose normal.  Mouth/Throat: Oropharynx is clear and moist.  Eyes: Pupils are equal, round, and reactive to light.  Neck: Normal range of motion. Neck supple. No thyromegaly present.  Cardiovascular: Normal rate, regular rhythm, normal heart sounds and intact distal pulses.   No murmur heard. Pulmonary/Chest: Effort normal and breath sounds normal. No respiratory distress. She has no wheezes.  Abdominal: Soft. Bowel sounds are normal. She exhibits no distension. There is no tenderness.  Musculoskeletal: Normal range of motion. She exhibits no edema or tenderness.  Neurological: She is alert and oriented to person, place, and time. She has normal reflexes. No cranial nerve deficit.  Skin: Skin is warm and dry.  Psychiatric: She has a normal mood and affect. Her behavior is normal. Judgment and thought content normal.  Vitals reviewed.     BP 170/60 mmHg  Pulse 55  Temp(Src) 97.4 F (36.3 C) (Oral)  Ht 5' 4"  (1.626 m)  Wt 215 lb (97.523  kg)  BMI 36.89 kg/m2     Assessment &  Plan:  1. Essential hypertension - CMP14+EGFR  2. Varicose veins - CMP14+EGFR  3. Seasonal allergic rhinitis - CMP14+EGFR  4. Hypothyroidism, unspecified hypothyroidism type - CMP14+EGFR - Thyroid Panel With TSH  5. Obesity, unspecified - CMP14+EGFR  6. Vitamin D deficiency - CMP14+EGFR  7. GAD (generalized anxiety disorder) - KYH06+CBJS  8. Metabolic syndrome - EGB15+VVOH  9. Obesity (BMI 30-39.9) - CMP14+EGFR   Continue all meds Labs pending Health Maintenance reviewed- TDAP and Zoster given today Diet and exercise encouraged RTO 4 months  Evelina Dun, FNP

## 2016-01-17 ENCOUNTER — Other Ambulatory Visit: Payer: Self-pay | Admitting: Family

## 2016-01-17 LAB — CMP14+EGFR
A/G RATIO: 1.4 (ref 1.1–2.5)
ALT: 19 IU/L (ref 0–32)
AST: 20 IU/L (ref 0–40)
Albumin: 4 g/dL (ref 3.5–4.7)
Alkaline Phosphatase: 65 IU/L (ref 39–117)
BUN/Creatinine Ratio: 24 (ref 11–26)
BUN: 23 mg/dL (ref 8–27)
Bilirubin Total: 0.3 mg/dL (ref 0.0–1.2)
CALCIUM: 9.4 mg/dL (ref 8.7–10.3)
CO2: 29 mmol/L (ref 18–29)
CREATININE: 0.96 mg/dL (ref 0.57–1.00)
Chloride: 90 mmol/L — ABNORMAL LOW (ref 96–106)
GFR, EST AFRICAN AMERICAN: 63 mL/min/{1.73_m2} (ref >59)
GFR, EST NON AFRICAN AMERICAN: 54 mL/min/{1.73_m2} — AB (ref >59)
Globulin, Total: 2.8 g/dL (ref 1.5–4.5)
Glucose: 82 mg/dL (ref 65–99)
POTASSIUM: 4.4 mmol/L (ref 3.5–5.2)
Sodium: 132 mmol/L — ABNORMAL LOW (ref 134–144)
TOTAL PROTEIN: 6.8 g/dL (ref 6.0–8.5)

## 2016-01-17 LAB — THYROID PANEL WITH TSH
FREE THYROXINE INDEX: 3.6 (ref 1.2–4.9)
T3 UPTAKE RATIO: 29 % (ref 24–39)
T4, Total: 12.5 ug/dL — ABNORMAL HIGH (ref 4.5–12.0)
TSH: 0.13 u[IU]/mL — ABNORMAL LOW (ref 0.450–4.500)

## 2016-01-17 MED ORDER — LEVOTHYROXINE SODIUM 125 MCG PO TABS: 125.0000 ug | ORAL_TABLET | Freq: Every day | ORAL | Status: DC

## 2016-03-20 ENCOUNTER — Other Ambulatory Visit: Payer: Self-pay | Admitting: *Deleted

## 2016-03-20 DIAGNOSIS — I10 Essential (primary) hypertension: Secondary | ICD-10-CM

## 2016-03-20 MED ORDER — CHLORTHALIDONE 25 MG PO TABS: 12.5000 mg | ORAL_TABLET | Freq: Every day | ORAL | Status: DC

## 2016-04-10 ENCOUNTER — Telehealth: Payer: Self-pay | Admitting: Family

## 2016-04-10 DIAGNOSIS — F411 Generalized anxiety disorder: Secondary | ICD-10-CM

## 2016-04-10 DIAGNOSIS — I10 Essential (primary) hypertension: Secondary | ICD-10-CM

## 2016-04-10 MED ORDER — CITALOPRAM HYDROBROMIDE 20 MG PO TABS: 20.0000 mg | ORAL_TABLET | Freq: Every day | ORAL | Status: DC

## 2016-04-10 NOTE — Telephone Encounter (Signed)
done

## 2016-04-30 ENCOUNTER — Other Ambulatory Visit: Payer: Self-pay

## 2016-04-30 ENCOUNTER — Other Ambulatory Visit: Payer: Self-pay | Admitting: *Deleted

## 2016-04-30 DIAGNOSIS — I10 Essential (primary) hypertension: Secondary | ICD-10-CM

## 2016-04-30 MED ORDER — CLONIDINE HCL 0.1 MG PO TABS: 0.1000 mg | ORAL_TABLET | Freq: Every day | ORAL | Status: DC

## 2016-04-30 MED ORDER — METOPROLOL SUCCINATE ER 50 MG PO TB24: 50.0000 mg | ORAL_TABLET | Freq: Every day | ORAL | Status: DC

## 2016-05-01 ENCOUNTER — Other Ambulatory Visit: Payer: Self-pay | Admitting: *Deleted

## 2016-05-01 DIAGNOSIS — I10 Essential (primary) hypertension: Secondary | ICD-10-CM

## 2016-05-01 MED ORDER — LOSARTAN POTASSIUM 100 MG PO TABS: 100.0000 mg | ORAL_TABLET | Freq: Every day | ORAL | Status: DC

## 2016-05-03 ENCOUNTER — Other Ambulatory Visit: Payer: Self-pay | Admitting: *Deleted

## 2016-05-03 MED ORDER — LEVOTHYROXINE SODIUM 125 MCG PO TABS: 125.0000 ug | ORAL_TABLET | Freq: Every day | ORAL | Status: DC

## 2016-05-17 ENCOUNTER — Ambulatory Visit (INDEPENDENT_AMBULATORY_CARE_PROVIDER_SITE_OTHER): Payer: Medicare Other | Admitting: Family

## 2016-05-17 ENCOUNTER — Encounter: Payer: Self-pay | Admitting: Family

## 2016-05-17 VITALS — BP 195/75 | HR 72 | Temp 97.8°F | Ht 64.0 in | Wt 216.0 lb

## 2016-05-17 DIAGNOSIS — E039 Hypothyroidism, unspecified: Secondary | ICD-10-CM

## 2016-05-17 DIAGNOSIS — E559 Vitamin D deficiency, unspecified: Secondary | ICD-10-CM | POA: Diagnosis not present

## 2016-05-17 DIAGNOSIS — E669 Obesity, unspecified: Secondary | ICD-10-CM

## 2016-05-17 DIAGNOSIS — I10 Essential (primary) hypertension: Secondary | ICD-10-CM | POA: Diagnosis not present

## 2016-05-17 DIAGNOSIS — F411 Generalized anxiety disorder: Secondary | ICD-10-CM | POA: Diagnosis not present

## 2016-05-17 DIAGNOSIS — E8881 Metabolic syndrome: Secondary | ICD-10-CM | POA: Diagnosis not present

## 2016-05-17 DIAGNOSIS — J302 Other seasonal allergic rhinitis: Secondary | ICD-10-CM

## 2016-05-17 NOTE — Progress Notes (Signed)
Subjective:    Patient ID: Katherine Tyler, female    DOB: May 29, 1929, 82 y.o.   MRN: [id number]  Pt presents to the office today for chronic follow up. PT is followed by nephrologists every 6 months for uncontrolled HTN. [name] BP is still not at goal today.  Hypertension This is a chronic problem. The current episode started more than 1 year ago. The problem has been waxing and waning since onset. The problem is uncontrolled. Associated symptoms include anxiety and malaise/fatigue. Pertinent negatives include no headaches, palpitations, peripheral edema, shortness of breath or sweats. Risk factors for coronary artery disease include family history, obesity, post-menopausal state and sedentary lifestyle. Past treatments include angiotensin blockers, diuretics, direct vasodilators, calcium channel blockers and beta blockers. The current treatment provides mild improvement. Hypertensive end-organ damage includes kidney disease and a thyroid problem. There is no history of CAD/MI, CVA or heart failure.  Thyroid Problem Presents for follow-up visit. Symptoms include anxiety. Patient reports no depressed mood, diarrhea, heat intolerance, menstrual problem, palpitations or visual change. The symptoms have been stable. Past treatments include levothyroxine. The treatment provided significant relief. There is no history of heart failure.  Anxiety Presents for follow-up visit. Onset was more than 5 years ago. The problem has been waxing and waning. Symptoms include excessive worry and nervous/anxious behavior. Patient reports no confusion, depressed mood, insomnia, palpitations or shortness of breath. Symptoms occur rarely. The severity of symptoms is mild. The symptoms are aggravated by family issues.   Her past medical history is significant for anxiety/panic attacks. There is no history of CAD or depression. Past treatments include SSRIs. The treatment provided significant relief. Compliance with prior treatments  has been good.      Review of Systems  Constitutional: Positive for malaise/fatigue.  HENT: Negative.   Eyes: Negative.   Respiratory: Negative.  Negative for shortness of breath.   Cardiovascular: Negative.  Negative for palpitations.  Gastrointestinal: Negative.  Negative for diarrhea.  Endocrine: Negative.  Negative for heat intolerance.  Genitourinary: Negative.  Negative for menstrual problem.  Musculoskeletal: Negative.   Neurological: Negative.  Negative for headaches.  Hematological: Negative.   Psychiatric/Behavioral: Negative for confusion. The patient is nervous/anxious. The patient does not have insomnia.   All other systems reviewed and are negative.      Objective:   Physical Exam  Constitutional: She is oriented to person, place, and time. She appears well-developed and well-nourished. No distress.  HENT:  Head: Normocephalic and atraumatic.  Right Ear: External ear normal.  Left Ear: External ear normal.  Nose: Nose normal.  Mouth/Throat: Oropharynx is clear and moist.  Eyes: Pupils are equal, round, and reactive to light.  Neck: Normal range of motion. Neck supple. No thyromegaly present.  Cardiovascular: Normal rate, regular rhythm, normal heart sounds and intact distal pulses.   No murmur heard. Pulmonary/Chest: Effort normal and breath sounds normal. No respiratory distress. She has no wheezes.  Abdominal: Soft. Bowel sounds are normal. She exhibits no distension. There is no tenderness.  Musculoskeletal: Normal range of motion. She exhibits no edema or tenderness.  Neurological: She is alert and oriented to person, place, and time. She has normal reflexes. No cranial nerve deficit.  Skin: Skin is warm and dry.  Psychiatric: She has a normal mood and affect. Her behavior is normal. Judgment and thought content normal.  Vitals reviewed.     BP 195/75 mmHg  Pulse 72  Temp(Src) 97.8 F (36.6 C) (Oral)  Ht _0  (1.626 m)  Wt  216 lb (97.977 kg)  BMI  37.06 kg/m2     Assessment & Plan:  1. Essential hypertension - CMP14+EGFR - CBC with Differential/Platelet  2. Seasonal allergic rhinitis - CMP14+EGFR - CBC with Differential/Platelet  3. Hypothyroidism, unspecified hypothyroidism type - CMP14+EGFR - Thyroid Panel With TSH - CBC with Differential/Platelet  4. Vitamin D deficiency - CMP14+EGFR - CBC with Differential/Platelet  5. GAD (generalized anxiety disorder) - CMP14+EGFR - CBC with Differential/Platelet  6. Metabolic syndrome - PYK99+IPJA - CBC with Differential/Platelet  7. Obesity (BMI 30-39.9) - CMP14+EGFR - CBC with Differential/Platelet   Continue all meds Labs pending Health Maintenance reviewed Diet and exercise encouraged RTO 4 months  Evelina Dun, FNP

## 2016-05-17 NOTE — Patient Instructions (Signed)
Hypertension Hypertension, commonly called high blood pressure, is when the force of blood pumping through your arteries is too strong. Your arteries are the blood vessels that carry blood from your heart throughout your body. A blood pressure reading consists of a higher number over a lower number, such as 110/72. The higher number (systolic) is the pressure inside your arteries when your heart pumps. The lower number (diastolic) is the pressure inside your arteries when your heart relaxes. Ideally you want your blood pressure below 120/80. Hypertension forces your heart to work harder to pump blood. Your arteries may become narrow or stiff. Having untreated or uncontrolled hypertension can cause heart attack, stroke, kidney disease, and other problems. RISK FACTORS Some risk factors for high blood pressure are controllable. Others are not.  Risk factors you cannot control include:   Race. You may be at higher risk if you are African American.  Age. Risk increases with age.  Gender. Men are at higher risk than women before age 45 years. After age 65, women are at higher risk than men. Risk factors you can control include:  Not getting enough exercise or physical activity.  Being overweight.  Getting too much fat, sugar, calories, or salt in your diet.  Drinking too much alcohol. SIGNS AND SYMPTOMS Hypertension does not usually cause signs or symptoms. Extremely high blood pressure (hypertensive crisis) may cause headache, anxiety, shortness of breath, and nosebleed. DIAGNOSIS To check if you have hypertension, your health care provider will measure your blood pressure while you are seated, with your arm held at the level of your heart. It should be measured at least twice using the same arm. Certain conditions can cause a difference in blood pressure between your right and left arms. A blood pressure reading that is higher than normal on one occasion does not mean that you need treatment. If  it is not clear whether you have high blood pressure, you may be asked to return on a different day to have your blood pressure checked again. Or, you may be asked to monitor your blood pressure at home for 1 or more weeks. TREATMENT Treating high blood pressure includes making lifestyle changes and possibly taking medicine. Living a healthy lifestyle can help lower high blood pressure. You may need to change some of your habits. Lifestyle changes may include:  Following the DASH diet. This diet is high in fruits, vegetables, and whole grains. It is low in salt, red meat, and added sugars.  Keep your sodium intake below 2,300 mg per day.  Getting at least 30-45 minutes of aerobic exercise at least 4 times per week.  Losing weight if necessary.  Not smoking.  Limiting alcoholic beverages.  Learning ways to reduce stress. Your health care provider may prescribe medicine if lifestyle changes are not enough to get your blood pressure under control, and if one of the following is true:  You are 18-59 years of age and your systolic blood pressure is above 140.  You are 60 years of age or older, and your systolic blood pressure is above 150.  Your diastolic blood pressure is above 90.  You have diabetes, and your systolic blood pressure is over 140 or your diastolic blood pressure is over 90.  You have kidney disease and your blood pressure is above 140/90.  You have heart disease and your blood pressure is above 140/90. Your personal target blood pressure may vary depending on your medical conditions, your age, and other factors. HOME CARE INSTRUCTIONS    Have your blood pressure rechecked as directed by your health care provider.   Take medicines only as directed by your health care provider. Follow the directions carefully. Blood pressure medicines must be taken as prescribed. The medicine does not work as well when you skip doses. Skipping doses also puts you at risk for  problems.  Do not smoke.   Monitor your blood pressure at home as directed by your health care provider. SEEK MEDICAL CARE IF:   You think you are having a reaction to medicines taken.  You have recurrent headaches or feel dizzy.  You have swelling in your ankles.  You have trouble with your vision. SEEK IMMEDIATE MEDICAL CARE IF:  You develop a severe headache or confusion.  You have unusual weakness, numbness, or feel faint.  You have severe chest or abdominal pain.  You vomit repeatedly.  You have trouble breathing. MAKE SURE YOU:   Understand these instructions.  Will watch your condition.  Will get help right away if you are not doing well or get worse.   This information is not intended to replace advice given to you by your health care provider. Make sure you discuss any questions you have with your health care provider.   Document Released: 11/26/2005 Document Revised: 04/12/2015 Document Reviewed: 09/18/2013 Elsevier Interactive Patient Education 2016 [employer]  

## 2016-05-18 LAB — CMP14+EGFR
ALBUMIN: 4.3 g/dL (ref 3.5–4.7)
ALK PHOS: 69 IU/L (ref 39–117)
ALT: 21 IU/L (ref 0–32)
AST: 22 IU/L (ref 0–40)
Albumin/Globulin Ratio: 1.5 (ref 1.2–2.2)
BILIRUBIN TOTAL: 0.4 mg/dL (ref 0.0–1.2)
BUN / CREAT RATIO: 26 (ref 12–28)
BUN: 22 mg/dL (ref 8–27)
CHLORIDE: 91 mmol/L — AB (ref 96–106)
CO2: 25 mmol/L (ref 18–29)
Calcium: 9.7 mg/dL (ref 8.7–10.3)
Creatinine, Ser: 0.85 mg/dL (ref 0.57–1.00)
GFR calc Af Amer: 72 mL/min/{1.73_m2} (ref >59)
GFR calc non Af Amer: 63 mL/min/{1.73_m2} (ref >59)
GLOBULIN, TOTAL: 2.9 g/dL (ref 1.5–4.5)
GLUCOSE: 86 mg/dL (ref 65–99)
Potassium: 4.1 mmol/L (ref 3.5–5.2)
SODIUM: 133 mmol/L — AB (ref 134–144)
Total Protein: 7.2 g/dL (ref 6.0–8.5)

## 2016-05-18 LAB — THYROID PANEL WITH TSH
Free Thyroxine Index: 3.5 (ref 1.2–4.9)
T3 Uptake Ratio: 27 % (ref 24–39)
T4, Total: 13 ug/dL — ABNORMAL HIGH (ref 4.5–12.0)
TSH: 0.129 u[IU]/mL — AB (ref 0.450–4.500)

## 2016-05-18 LAB — CBC WITH DIFFERENTIAL/PLATELET
Basophils Absolute: 0 10*3/uL (ref 0.0–0.2)
Basos: 0 %
EOS (ABSOLUTE): 0.1 10*3/uL (ref 0.0–0.4)
EOS: 1 %
HEMOGLOBIN: 11.9 g/dL (ref 11.1–15.9)
Hematocrit: 36.2 % (ref 34.0–46.6)
Immature Grans (Abs): 0 10*3/uL (ref 0.0–0.1)
Immature Granulocytes: 0 %
LYMPHS ABS: 0.9 10*3/uL (ref 0.7–3.1)
Lymphs: 15 %
MCH: 32.2 pg (ref 26.6–33.0)
MCHC: 32.9 g/dL (ref 31.5–35.7)
MCV: 98 fL — AB (ref 79–97)
MONOCYTES: 7 %
Monocytes Absolute: 0.4 10*3/uL (ref 0.1–0.9)
NEUTROS ABS: 4.7 10*3/uL (ref 1.4–7.0)
Neutrophils: 77 %
Platelets: 181 10*3/uL (ref 150–379)
RBC: 3.7 x10E6/uL — ABNORMAL LOW (ref 3.77–5.28)
RDW: 13.3 % (ref 12.3–15.4)
WBC: 6.1 10*3/uL (ref 3.4–10.8)

## 2016-05-20 ENCOUNTER — Other Ambulatory Visit: Payer: Self-pay | Admitting: Family

## 2016-05-20 MED ORDER — LEVOTHYROXINE SODIUM 112 MCG PO TABS
112.0000 ug | ORAL_TABLET | Freq: Every day | ORAL | Status: DC
Start: 2016-05-20 — End: 2016-07-03

## 2016-05-25 ENCOUNTER — Other Ambulatory Visit: Payer: Self-pay

## 2016-05-25 ENCOUNTER — Other Ambulatory Visit: Payer: Self-pay | Admitting: Family

## 2016-05-25 DIAGNOSIS — I10 Essential (primary) hypertension: Secondary | ICD-10-CM

## 2016-05-25 MED ORDER — DILTIAZEM HCL ER 180 MG PO CP24: 360.0000 mg | ORAL_CAPSULE | Freq: Every day | ORAL | Status: DC

## 2016-06-18 ENCOUNTER — Emergency Department (HOSPITAL_COMMUNITY): Payer: Medicare Other

## 2016-06-18 ENCOUNTER — Inpatient Hospital Stay (HOSPITAL_COMMUNITY)
Admission: EM | Admit: 2016-06-18 | Discharge: 2016-06-21 | DRG: 690 | Disposition: A | Discharge status: Home Health | Payer: Medicare Other | Attending: Internal Medicine | Admitting: Internal Medicine

## 2016-06-18 ENCOUNTER — Encounter (HOSPITAL_COMMUNITY): Payer: Self-pay | Admitting: *Deleted

## 2016-06-18 DIAGNOSIS — Z7982 Long term (current) use of aspirin: Secondary | ICD-10-CM

## 2016-06-18 DIAGNOSIS — E876 Hypokalemia: Secondary | ICD-10-CM | POA: Diagnosis not present

## 2016-06-18 DIAGNOSIS — I10 Essential (primary) hypertension: Secondary | ICD-10-CM | POA: Diagnosis not present

## 2016-06-18 DIAGNOSIS — E860 Dehydration: Secondary | ICD-10-CM | POA: Diagnosis present

## 2016-06-18 DIAGNOSIS — R110 Nausea: Secondary | ICD-10-CM | POA: Diagnosis present

## 2016-06-18 DIAGNOSIS — E871 Hypo-osmolality and hyponatremia: Secondary | ICD-10-CM | POA: Diagnosis present

## 2016-06-18 DIAGNOSIS — E119 Type 2 diabetes mellitus without complications: Secondary | ICD-10-CM | POA: Diagnosis not present

## 2016-06-18 DIAGNOSIS — Z881 Allergy status to other antibiotic agents status: Secondary | ICD-10-CM

## 2016-06-18 DIAGNOSIS — N390 Urinary tract infection, site not specified: Principal | ICD-10-CM | POA: Diagnosis present

## 2016-06-18 DIAGNOSIS — E785 Hyperlipidemia, unspecified: Secondary | ICD-10-CM | POA: Diagnosis present

## 2016-06-18 DIAGNOSIS — R1013 Epigastric pain: Secondary | ICD-10-CM | POA: Diagnosis not present

## 2016-06-18 DIAGNOSIS — R1084 Generalized abdominal pain: Secondary | ICD-10-CM | POA: Diagnosis not present

## 2016-06-18 DIAGNOSIS — Z9049 Acquired absence of other specified parts of digestive tract: Secondary | ICD-10-CM

## 2016-06-18 DIAGNOSIS — R109 Unspecified abdominal pain: Secondary | ICD-10-CM | POA: Diagnosis present

## 2016-06-18 DIAGNOSIS — K219 Gastro-esophageal reflux disease without esophagitis: Secondary | ICD-10-CM | POA: Diagnosis present

## 2016-06-18 DIAGNOSIS — K5730 Diverticulosis of large intestine without perforation or abscess without bleeding: Secondary | ICD-10-CM | POA: Diagnosis not present

## 2016-06-18 DIAGNOSIS — E039 Hypothyroidism, unspecified: Secondary | ICD-10-CM | POA: Diagnosis present

## 2016-06-18 DIAGNOSIS — E559 Vitamin D deficiency, unspecified: Secondary | ICD-10-CM | POA: Diagnosis present

## 2016-06-18 DIAGNOSIS — K7460 Unspecified cirrhosis of liver: Secondary | ICD-10-CM | POA: Clinically undetermined

## 2016-06-18 DIAGNOSIS — F419 Anxiety disorder, unspecified: Secondary | ICD-10-CM | POA: Diagnosis present

## 2016-06-18 DIAGNOSIS — F329 Major depressive disorder, single episode, unspecified: Secondary | ICD-10-CM | POA: Diagnosis present

## 2016-06-18 DIAGNOSIS — E861 Hypovolemia: Secondary | ICD-10-CM | POA: Diagnosis present

## 2016-06-18 DIAGNOSIS — Z79899 Other long term (current) drug therapy: Secondary | ICD-10-CM

## 2016-06-18 LAB — CBC WITH DIFFERENTIAL/PLATELET
BASOS PCT: 0 %
Basophils Absolute: 0 10*3/uL (ref 0.0–0.1)
EOS ABS: 0 10*3/uL (ref 0.0–0.7)
EOS PCT: 1 %
HCT: 36.5 % (ref 36.0–46.0)
Hemoglobin: 12.9 g/dL (ref 12.0–15.0)
Lymphocytes Relative: 10 %
Lymphs Abs: 0.6 10*3/uL — ABNORMAL LOW (ref 0.7–4.0)
MCH: 32.4 pg (ref 26.0–34.0)
MCHC: 35.3 g/dL (ref 30.0–36.0)
MCV: 91.7 fL (ref 78.0–100.0)
MONO ABS: 0.5 10*3/uL (ref 0.1–1.0)
MONOS PCT: 9 %
Neutro Abs: 4.9 10*3/uL (ref 1.7–7.7)
Neutrophils Relative %: 80 %
Platelets: 154 10*3/uL (ref 150–400)
RBC: 3.98 MIL/uL (ref 3.87–5.11)
RDW: 11.9 % (ref 11.5–15.5)
WBC: 6 10*3/uL (ref 4.0–10.5)

## 2016-06-18 LAB — COMPREHENSIVE METABOLIC PANEL
ALT: 20 U/L (ref 14–54)
AST: 26 U/L (ref 15–41)
Albumin: 3.7 g/dL (ref 3.5–5.0)
Alkaline Phosphatase: 59 U/L (ref 38–126)
Anion gap: 9 (ref 5–15)
BUN: 16 mg/dL (ref 6–20)
CHLORIDE: 87 mmol/L — AB (ref 101–111)
CO2: 26 mmol/L (ref 22–32)
Calcium: 9.5 mg/dL (ref 8.9–10.3)
Creatinine, Ser: 0.88 mg/dL (ref 0.44–1.00)
GFR, EST NON AFRICAN AMERICAN: 58 mL/min — AB (ref >60)
Glucose, Bld: 115 mg/dL — ABNORMAL HIGH (ref 65–99)
POTASSIUM: 3.1 mmol/L — AB (ref 3.5–5.1)
SODIUM: 122 mmol/L — AB (ref 135–145)
Total Bilirubin: 0.8 mg/dL (ref 0.3–1.2)
Total Protein: 6.9 g/dL (ref 6.5–8.1)

## 2016-06-18 LAB — URINALYSIS, ROUTINE W REFLEX MICROSCOPIC
BILIRUBIN URINE: NEGATIVE
Glucose, UA: NEGATIVE mg/dL
Hgb urine dipstick: NEGATIVE
Ketones, ur: NEGATIVE mg/dL
NITRITE: POSITIVE — AB
PH: 7.5 (ref 5.0–8.0)
Protein, ur: NEGATIVE mg/dL
Specific Gravity, Urine: 1.01 (ref 1.005–1.030)

## 2016-06-18 LAB — URINE MICROSCOPIC-ADD ON

## 2016-06-18 LAB — I-STAT CHEM 8, ED
BUN: 17 mg/dL (ref 6–20)
CHLORIDE: 85 mmol/L — AB (ref 101–111)
Calcium, Ion: 1.14 mmol/L (ref 1.12–1.23)
Creatinine, Ser: 0.9 mg/dL (ref 0.44–1.00)
Glucose, Bld: 102 mg/dL — ABNORMAL HIGH (ref 65–99)
HEMATOCRIT: 40 % (ref 36.0–46.0)
HEMOGLOBIN: 13.6 g/dL (ref 12.0–15.0)
POTASSIUM: 3.6 mmol/L (ref 3.5–5.1)
SODIUM: 122 mmol/L — AB (ref 135–145)
TCO2: 29 mmol/L (ref 0–100)

## 2016-06-18 LAB — BASIC METABOLIC PANEL
Anion gap: 6 (ref 5–15)
BUN: 13 mg/dL (ref 6–20)
CALCIUM: 9.3 mg/dL (ref 8.9–10.3)
CO2: 27 mmol/L (ref 22–32)
CREATININE: 0.95 mg/dL (ref 0.44–1.00)
Chloride: 91 mmol/L — ABNORMAL LOW (ref 101–111)
GFR calc non Af Amer: 53 mL/min — ABNORMAL LOW (ref >60)
Glucose, Bld: 123 mg/dL — ABNORMAL HIGH (ref 65–99)
Potassium: 4.2 mmol/L (ref 3.5–5.1)
SODIUM: 124 mmol/L — AB (ref 135–145)

## 2016-06-18 LAB — I-STAT TROPONIN, ED: TROPONIN I, POC: 0.01 ng/mL (ref 0.00–0.08)

## 2016-06-18 LAB — MAGNESIUM: MAGNESIUM: 1.9 mg/dL (ref 1.7–2.4)

## 2016-06-18 LAB — PROTIME-INR
INR: 1.1 (ref 0.00–1.49)
PROTHROMBIN TIME: 14.4 s (ref 11.6–15.2)

## 2016-06-18 LAB — LIPASE, BLOOD: Lipase: 25 U/L (ref 11–51)

## 2016-06-18 MED ORDER — BISACODYL 5 MG PO TBEC: 5.0000 mg | DELAYED_RELEASE_TABLET | Freq: Every day | ORAL | Status: DC | PRN

## 2016-06-18 MED ORDER — SODIUM CHLORIDE 0.9 % IV BOLUS (SEPSIS)
500.0000 mL | Freq: Once | INTRAVENOUS | Status: AC
Start: 2016-06-18 — End: 2016-06-18
  Administered 2016-06-18: 500 mL via INTRAVENOUS

## 2016-06-18 MED ORDER — ACETAMINOPHEN 650 MG RE SUPP: 650.0000 mg | Freq: Four times a day (QID) | RECTAL | Status: DC | PRN

## 2016-06-18 MED ORDER — LOSARTAN POTASSIUM 50 MG PO TABS
100.0000 mg | ORAL_TABLET | Freq: Every day | ORAL | Status: DC
  Administered 2016-06-18 – 2016-06-20 (×3): 100 mg via ORAL
  Filled 2016-06-18 (×3): qty 2

## 2016-06-18 MED ORDER — ONDANSETRON HCL 4 MG/2ML IJ SOLN: 4.0000 mg | Freq: Four times a day (QID) | INTRAMUSCULAR | Status: DC | PRN

## 2016-06-18 MED ORDER — ACETAMINOPHEN 325 MG PO TABS
650.0000 mg | ORAL_TABLET | Freq: Four times a day (QID) | ORAL | Status: DC | PRN
  Administered 2016-06-19: 650 mg via ORAL
  Filled 2016-06-18: qty 2

## 2016-06-18 MED ORDER — LEVOTHYROXINE SODIUM 112 MCG PO TABS
112.0000 ug | ORAL_TABLET | Freq: Every day | ORAL | Status: DC
  Administered 2016-06-18 – 2016-06-21 (×4): 112 ug via ORAL
  Filled 2016-06-18 (×4): qty 1

## 2016-06-18 MED ORDER — TRAZODONE HCL 50 MG PO TABS
25.0000 mg | ORAL_TABLET | Freq: Every evening | ORAL | Status: DC | PRN
  Administered 2016-06-20: 25 mg via ORAL
  Filled 2016-06-18: qty 1

## 2016-06-18 MED ORDER — CHLORTHALIDONE 25 MG PO TABS
12.5000 mg | ORAL_TABLET | Freq: Every day | ORAL | Status: DC
  Administered 2016-06-18 – 2016-06-20 (×3): 12.5 mg via ORAL
  Filled 2016-06-18 (×3): qty 0.5

## 2016-06-18 MED ORDER — ONDANSETRON HCL 4 MG PO TABS: 4.0000 mg | ORAL_TABLET | Freq: Four times a day (QID) | ORAL | Status: DC | PRN

## 2016-06-18 MED ORDER — METOPROLOL SUCCINATE ER 50 MG PO TB24
50.0000 mg | ORAL_TABLET | Freq: Every day | ORAL | Status: DC
  Administered 2016-06-18 – 2016-06-21 (×4): 50 mg via ORAL
  Filled 2016-06-18 (×4): qty 1

## 2016-06-18 MED ORDER — CLONIDINE HCL 0.1 MG PO TABS
0.1000 mg | ORAL_TABLET | Freq: Every day | ORAL | Status: DC
  Administered 2016-06-18 – 2016-06-21 (×4): 0.1 mg via ORAL
  Filled 2016-06-18 (×4): qty 1

## 2016-06-18 MED ORDER — POLYETHYLENE GLYCOL 3350 17 G PO PACK: 17.0000 g | PACK | Freq: Every day | ORAL | Status: DC | PRN

## 2016-06-18 MED ORDER — POTASSIUM CHLORIDE 10 MEQ/100ML IV SOLN
10.0000 meq | INTRAVENOUS | Status: AC
  Administered 2016-06-18 (×4): 10 meq via INTRAVENOUS
  Filled 2016-06-18: qty 100

## 2016-06-18 MED ORDER — POTASSIUM CHLORIDE CRYS ER 20 MEQ PO TBCR
40.0000 meq | EXTENDED_RELEASE_TABLET | Freq: Once | ORAL | Status: AC
  Administered 2016-06-18: 40 meq via ORAL
  Filled 2016-06-18: qty 2

## 2016-06-18 MED ORDER — DILTIAZEM HCL ER COATED BEADS 180 MG PO CP24
180.0000 mg | ORAL_CAPSULE | Freq: Two times a day (BID) | ORAL | Status: DC
  Administered 2016-06-18 – 2016-06-21 (×6): 180 mg via ORAL
  Filled 2016-06-18 (×6): qty 1

## 2016-06-18 MED ORDER — SODIUM CHLORIDE 0.9 % IV SOLN
INTRAVENOUS | Status: DC
  Administered 2016-06-18 – 2016-06-20 (×3): via INTRAVENOUS

## 2016-06-18 MED ORDER — MAGNESIUM OXIDE 400 (241.3 MG) MG PO TABS
400.0000 mg | ORAL_TABLET | Freq: Every day | ORAL | Status: DC
  Administered 2016-06-18 – 2016-06-21 (×4): 400 mg via ORAL
  Filled 2016-06-18 (×4): qty 1

## 2016-06-18 MED ORDER — ENOXAPARIN SODIUM 40 MG/0.4ML ~~LOC~~ SOLN
40.0000 mg | SUBCUTANEOUS | Status: DC
  Administered 2016-06-18 – 2016-06-20 (×3): 40 mg via SUBCUTANEOUS
  Filled 2016-06-18 (×3): qty 0.4

## 2016-06-18 MED ORDER — ASPIRIN EC 81 MG PO TBEC
81.0000 mg | DELAYED_RELEASE_TABLET | Freq: Every day | ORAL | Status: DC
  Administered 2016-06-19 – 2016-06-21 (×3): 81 mg via ORAL
  Filled 2016-06-18 (×3): qty 1

## 2016-06-18 MED ORDER — DIATRIZOATE MEGLUMINE & SODIUM 66-10 % PO SOLN
ORAL | Status: AC
  Administered 2016-06-18: 08:00:00
  Filled 2016-06-18: qty 30

## 2016-06-18 MED ORDER — DILTIAZEM HCL ER 180 MG PO CP24: 180.0000 mg | ORAL_CAPSULE | Freq: Two times a day (BID) | ORAL | Status: DC

## 2016-06-18 MED ORDER — IOPAMIDOL (ISOVUE-300) INJECTION 61%
INTRAVENOUS | Status: AC
  Administered 2016-06-18: 70 mL
  Filled 2016-06-18: qty 75

## 2016-06-18 MED ORDER — CIPROFLOXACIN HCL 500 MG PO TABS
500.0000 mg | ORAL_TABLET | Freq: Two times a day (BID) | ORAL | Status: DC
  Administered 2016-06-18 – 2016-06-21 (×6): 500 mg via ORAL
  Filled 2016-06-18 (×6): qty 1

## 2016-06-18 MED ORDER — FAMOTIDINE 20 MG PO TABS
20.0000 mg | ORAL_TABLET | Freq: Every day | ORAL | Status: DC
  Administered 2016-06-18 – 2016-06-21 (×4): 20 mg via ORAL
  Filled 2016-06-18 (×4): qty 1

## 2016-06-18 MED ORDER — CITALOPRAM HYDROBROMIDE 20 MG PO TABS
20.0000 mg | ORAL_TABLET | Freq: Every day | ORAL | Status: DC
  Administered 2016-06-18 – 2016-06-21 (×4): 20 mg via ORAL
  Filled 2016-06-18 (×4): qty 1

## 2016-06-18 MED ORDER — CIPROFLOXACIN HCL 500 MG PO TABS
500.0000 mg | ORAL_TABLET | Freq: Once | ORAL | Status: AC
  Administered 2016-06-18: 500 mg via ORAL
  Filled 2016-06-18: qty 1

## 2016-06-18 NOTE — ED Notes (Signed)
Skin warm and dry the pts family is on the way here

## 2016-06-18 NOTE — ED Notes (Signed)
Full bed change and gown change, assisted by Dellene Mcgroarty- NT and Elizabeth-NT.

## 2016-06-18 NOTE — H&P (Signed)
History and Physical    Katherine Tyler ZOX:[id number] DOB: 06/20/1929 DOA: 06/18/2016  PCP: Jannifer Rodneyhristy Hawks, FNP  Patient coming from:    Home    Chief Complaint: Sick on stomach  HPI: Katherine Tyler is a 82 y.o. female with medical history significant for but not limited to, morbid obesity, DM 2, and thyroid disease. Patient presents to the emergency department with a three-day history of nausea without vomiting. No abdominal pain. No diarrhea or any bowel changes. No dizziness or chest pain.  She has eaten a few small meals over the last few days but fluid intake has been minimal. No fevers or chills. Urinating at home. No dysuria.   ED Course:  Afebrile, hemodynamically stable Sodium 122, baseline l30s.   POC troponin negative UA with positive nitrites, many bacteria, small leukocytes CT abdomen pelvis with contrast suggests cirrhosis, no acute findings Given oral dose of Cipro, oral potassium, and 500 mL normal saline bolus  Review of Systems: As per HPI, otherwise 10 point review of systems negative.    Past Medical History  Diagnosis Date  . Hyperlipidemia   . Hypertension   . Arthritis   . Hiatal hernia   . Anxiety   . GERD (gastroesophageal reflux disease)   . Vitamin D deficiency   . Thyroid disease   . Diabetes mellitus without complication (HCC)   . Obesity     History reviewed. No pertinent past surgical history.  Social History   Social History  . Marital Status: Widowed    Spouse Name: N/A  . Number of Children: N/A  . Years of Education: N/A   Occupational History  . Not on file.   Social History Main Topics  . Smoking status: Never Smoker   . Smokeless tobacco: Former NeurosurgeonUser    Types: Snuff  . Alcohol Use: No  . Drug Use: No  . Sexual Activity: No   Other Topics Concern  . Not on file   Social History Narrative  Lives at home alone. She does use a walker for ambulation  Allergies  Allergen Reactions  . Terazosin Hcl Other (See Comments)    Muscle  aches  . Keflex [Cephalexin] Other (See Comments)    unknown    Family History  Problem Relation Age of Onset  . Heart disease Mother   . Heart disease Father   . Heart disease Sister   . Heart disease Brother     Prior to Admission medications   Medication Sig Start Date End Date Taking? Authorizing Provider  aspirin 81 MG tablet Take 81 mg by mouth daily.   Yes Historical Provider, MD  chlorthalidone (HYGROTON) 25 MG tablet Take 0.5 tablets (12.5 mg total) by mouth daily. 03/20/16  Yes Junie Spencerhristy A Hawks, FNP  Cholecalciferol (D3-1000) 1000 UNITS capsule Take 1,000 Units by mouth daily.    Yes Historical Provider, MD  citalopram (CELEXA) 20 MG tablet Take 1 tablet (20 mg total) by mouth daily. 04/10/16  Yes Junie Spencerhristy A Hawks, FNP  cloNIDine (CATAPRES) 0.1 MG tablet Take 1 tablet (0.1 mg total) by mouth daily. 04/30/16  Yes Junie Spencerhristy A Hawks, FNP  diltiazem (DILACOR XR) 180 MG 24 hr capsule Take 2 capsules (360 mg total) by mouth daily. Patient taking differently: Take 180 mg by mouth 2 (two) times daily.  05/25/16  Yes Junie Spencerhristy A Hawks, FNP  Ferrous Sulfate Dried (SLOW RELEASE IRON) 45 MG TBCR Take 45 mg by mouth 2 (two) times daily.    Yes Historical Provider, MD  levothyroxine (SYNTHROID) 112 MCG tablet Take 1 tablet (112 mcg total) by mouth daily before breakfast. 05/20/16  Yes Junie Spencer, FNP  losartan (COZAAR) 100 MG tablet Take 1 tablet (100 mg total) by mouth daily. Changed by Salomon Mast , MD (kidney doctor) 05/01/16  Yes Junie Spencer, FNP  magnesium oxide (MAG-OX) 400 (241.3 MG) MG tablet Take 1 tablet (400 mg total) by mouth daily. 02/14/15  Yes Junie Spencer, FNP  metoprolol succinate (TOPROL-XL) 50 MG 24 hr tablet Take 1 tablet (50 mg total) by mouth daily. Take with or immediately following a meal. 04/30/16  Yes Dangela-Margaret Daphine Deutscher, FNP  thiamine (VITAMIN B-1) 100 MG tablet Take 100 mg by mouth daily.   Yes Historical Provider, MD    Physical Exam: Filed Vitals:    06/18/16 1015 06/18/16 1115 06/18/16 1130 06/18/16 1207  BP: 179/85 154/62 169/73 164/73  Pulse: 79 75 81   Temp:      TempSrc:      Resp: 23  18   Weight:      SpO2: 98% 99% 99%     Constitutional:  Obese white female in NAD, calm, comfortable Filed Vitals:   06/18/16 1015 06/18/16 1115 06/18/16 1130 06/18/16 1207  BP: 179/85 154/62 169/73 164/73  Pulse: 79 75 81   Temp:      TempSrc:      Resp: 23  18   Weight:      SpO2: 98% 99% 99%    Eyes: PER, lids and conjunctivae normal ENMT: Mucous membranes are moist. Posterior pharynx clear of any exudate or lesions..  Neck: normal, supple, no masses Respiratory: By basilar crackles , no wheezing,  Normal respiratory effort. No accessory muscle use.  Cardiovascular: Regular rate and rhythm, + murmurs. No rubs / gallops. No extremity edema. 2+ pedal pulses.  Abdomen: obese, non-distended, non-tender, no masses palpated.  Bowel sounds positive.  Musculoskeletal: no clubbing / cyanosis. No joint deformity upper and lower extremities. Good ROM, no contractures. Normal muscle tone.  Skin: no rashes, lesions, ulcers.  Neurologic: CN 2-12 grossly intact. Sensation intact, Strength 5/5 in all 4.  Psychiatric: Normal judgment and insight. Alert and oriented x 3. Normal mood.   Labs on Admission: I have personally reviewed following labs and imaging studies   Urine analysis:    Component Value Date/Time   COLORURINE YELLOW 06/18/2016 0805   APPEARANCEUR CLEAR 06/18/2016 0805   LABSPEC 1.010 06/18/2016 0805   PHURINE 7.5 06/18/2016 0805   GLUCOSEU NEGATIVE 06/18/2016 0805   HGBUR NEGATIVE 06/18/2016 0805   BILIRUBINUR NEGATIVE 06/18/2016 0805   KETONESUR NEGATIVE 06/18/2016 0805   PROTEINUR NEGATIVE 06/18/2016 0805   UROBILINOGEN 1.0 03/08/2008 1015   NITRITE POSITIVE* 06/18/2016 0805   LEUKOCYTESUR SMALL* 06/18/2016 0805    Radiological Exams on Admission: Ct Abdomen Pelvis W Contrast  06/18/2016  CLINICAL DATA:  Epigastric pain  and nausea and vomiting. EXAM: CT ABDOMEN AND PELVIS WITH CONTRAST TECHNIQUE: Multidetector CT imaging of the abdomen and pelvis was performed using the standard protocol following bolus administration of intravenous contrast. CONTRAST:  1 ISOVUE-300 IOPAMIDOL (ISOVUE-300) INJECTION 61% COMPARISON:  None. FINDINGS: Lower chest: No acute findings. Aortic and coronary artery atherosclerosis. Hepatobiliary: There is nodularity of the liver contour with parenchymal scarring at the lateral aspect of the dome of the right lobe of the liver. Gallbladder has been removed. No bile duct dilatation. Calcified granuloma in the left lobe of the liver. Pancreas: No mass, inflammatory changes, or other significant abnormality.  Spleen: Within normal limits in size and appearance. Adrenals/Urinary Tract: The ureters are slightly dilated into the pelvis where they are compressed by a distended urinary bladder. No solid mass lesions or stones. 11 mm cyst in the lower pole of the left kidney. Stomach/Bowel: Diverticulosis of the proximal sigmoid portion of the colon. No diverticulitis. Terminal ileum and appendix are normal. Vascular/Lymphatic: Aortic atherosclerosis. Reproductive: No mass or other significant abnormality. Other: No free air or free fluid. Musculoskeletal: No acute abnormalities. Degenerative disc and joint disease in the lumbar spine. IMPRESSION: No acute abnormalities. Cirrhosis. Scarring in the right lobe of the liver. Sigmoid diverticulosis. Aortic atherosclerosis. Electronically Signed   By: Francene Boyers M.D.   On: 06/18/2016 11:18    EKG: Independently reviewed.   EKG Interpretation  Date/Time:  Monday June 18 2016 07:23:00 EDT Ventricular Rate:  66 PR Interval:    QRS Duration: 143 QT Interval:  471 QTC Calculation: 494 R Axis:   -34 Text Interpretation:  Sinus rhythm Prolonged PR interval Right bundle branch block Left ventricular hypertrophy Artifact Confirmed by Lincoln Brigham [phone]) on 06/18/2016  7:56:56 AM Also confirmed by Lincoln Brigham [phone]), editor Wandalee Ferdinand [phone])  on 06/18/2016 8:49:23 AM      Assessment/Plan   Active Problems:   HTN (hypertension)   Hypothyroidism   Hyponatremia   Nausea without vomiting   UTI (urinary tract infection)   Hepatic cirrhosis (HCC)   Nausea, possibly secondary to UTI.  -Treat UTI -anti-emetics prn -daily Pepcid  Hyponatremia.     -Probably mild dehydration / nutritional with poor intake over last few days.  -She got bolus in ED, continue NS at 50cc /hr -Repeat bmet at 2100 and in am BMET   Hypokalemia. Given kdur in ED.  -Will give additional potassium but in IV form given her nausea. -Check magnesium level, she takes magnesium at home -3 runs of K+ x4  then recheck labs in am   Possible cirrhosis by CTscan.  Normal LFTs, platelets borderline low.  -check coags, HBV surface antigen, HBV surface ab (for immunity), HCV and HAV total (for immunity).  -If she does have cirrhosis, there is no evidence for decompensation at present and this can be followed up outpatient.         Urinary tract infection -Follow up on urine culture -continue cipro started in ED (Rocephin not ordered based on allergies)  Depression. Stable . -Continue Celexa  Hypertension.  Controlled.. -Continue home anti-hypertension medications  Hypothyroidism.   Thyroid studies early June suggested over replacement with slightly depressed TSH at 0.129 and T4 13. Synthroid dose was decreased to 0.119mcgs -Continue home Synthroid                                     DVT prophylaxis:   Lovenox Code Status:   Full code Family Communication:  none.  Disposition Plan:  Discharge home in 24-48 hours              Consults called:   none  Admission status:  Observation - Medical   Willette Cluster NP [facility] Pager [phone]  If 7PM-7AM, please contact night-coverage [email or web address] Password [email or web address]  06/18/2016, 2:08 PM

## 2016-06-18 NOTE — ED Notes (Signed)
The pt arrived [facility] from home.  She is c/o  Nausea since yesterday.  She feels funny in her abd since 0200am today  Denies pain

## 2016-06-18 NOTE — ED Provider Notes (Signed)
CSN: [id number]     Arrival date & time 06/18/16  0645 History   First MD Initiated Contact with Patient 06/18/16 [phone]     Chief Complaint  Patient presents with  . Abdominal Pain     Patient is a 83 y.o. female presenting with abdominal pain. The history is provided by the patient. No language interpreter was used.  Abdominal Pain  Katherine Tyler is a 82 y.o. female who presents to the Emergency Department complaining of abdominal pain.  She reports 3-4 days of intermittent epigastric pain and nausea. The pain is vague and difficult to describe but she has nausea and decreased appetite. No vomiting, fevers, chest pain, shortness of breath, dysuria, diarrhea, constipation. Symptoms are moderate in waxing and waning. She's had prior similar symptoms in the past but they have not lasted this long. She has a history of cholecystectomy, diabetes, hypertension, hyperlipidemia.   Past Medical History  Diagnosis Date  . Hyperlipidemia   . Hypertension   . Arthritis   . Hiatal hernia   . Anxiety   . GERD (gastroesophageal reflux disease)   . Vitamin D deficiency   . Thyroid disease   . Diabetes mellitus without complication (HCC)   . Obesity    History reviewed. No pertinent past surgical history. Family History  Problem Relation Age of Onset  . Heart disease Mother   . Heart disease Father   . Heart disease Sister   . Heart disease Brother    Social History  Substance Use Topics  . Smoking status: Never Smoker   . Smokeless tobacco: Former Neurosurgeon    Types: Snuff  . Alcohol Use: No   OB History    No data available     Review of Systems  Gastrointestinal: Positive for abdominal pain.  All other systems reviewed and are negative.     Allergies  Terazosin hcl and Keflex  Home Medications   Prior to Admission medications   Medication Sig Start Date End Date Taking? Authorizing Provider  aspirin 81 MG tablet Take 81 mg by mouth daily.   Yes Historical Provider, MD   chlorthalidone (HYGROTON) 25 MG tablet Take 0.5 tablets (12.5 mg total) by mouth daily. 03/20/16  Yes Junie Spencer, FNP  Cholecalciferol (D3-1000) 1000 UNITS capsule Take 1,000 Units by mouth daily.    Yes Historical Provider, MD  citalopram (CELEXA) 20 MG tablet Take 1 tablet (20 mg total) by mouth daily. 04/10/16  Yes Junie Spencer, FNP  cloNIDine (CATAPRES) 0.1 MG tablet Take 1 tablet (0.1 mg total) by mouth daily. 04/30/16  Yes Junie Spencer, FNP  diltiazem (DILACOR XR) 180 MG 24 hr capsule Take 2 capsules (360 mg total) by mouth daily. Patient taking differently: Take 180 mg by mouth 2 (two) times daily.  05/25/16  Yes Junie Spencer, FNP  Ferrous Sulfate Dried (SLOW RELEASE IRON) 45 MG TBCR Take 45 mg by mouth 2 (two) times daily.    Yes Historical Provider, MD  levothyroxine (SYNTHROID) 112 MCG tablet Take 1 tablet (112 mcg total) by mouth daily before breakfast. 05/20/16  Yes Junie Spencer, FNP  losartan (COZAAR) 100 MG tablet Take 1 tablet (100 mg total) by mouth daily. Changed by Salomon Mast , MD (kidney doctor) 05/01/16  Yes Junie Spencer, FNP  magnesium oxide (MAG-OX) 400 (241.3 MG) MG tablet Take 1 tablet (400 mg total) by mouth daily. 02/14/15  Yes Junie Spencer, FNP  metoprolol succinate (TOPROL-XL) 50 MG 24 hr tablet  Take 1 tablet (50 mg total) by mouth daily. Take with or immediately following a meal. 04/30/16  Yes Rut-Margaret Daphine DeutscherMartin, FNP  thiamine (VITAMIN B-1) 100 MG tablet Take 100 mg by mouth daily.   Yes Historical Provider, MD   BP 170/77 mmHg  Pulse 81  Temp(Src) 98 F (36.7 C) (Oral)  Resp 18  Wt 245 lb (111.131 kg)  SpO2 94% Physical Exam  Constitutional: She is oriented to person, place, and time. She appears well-developed and well-nourished.  HENT:  Head: Normocephalic and atraumatic.  Cardiovascular: Normal rate and regular rhythm.   Faint systolic ejection murmur  Pulmonary/Chest: Effort normal. No respiratory distress.  Final crackles in  bilateral bases  Abdominal: Soft. There is no tenderness. There is no rebound and no guarding.  Musculoskeletal: She exhibits no edema or tenderness.  Neurological: She is alert and oriented to person, place, and time.  Skin: Skin is warm and dry.  Psychiatric: She has a normal mood and affect. Her behavior is normal.  Nursing note and vitals reviewed.   ED Course  Procedures (including critical care time) Labs Review Labs Reviewed  COMPREHENSIVE METABOLIC PANEL - Abnormal; Notable for the following:    Sodium 122 (*)    Potassium 3.1 (*)    Chloride 87 (*)    Glucose, Bld 115 (*)    GFR calc non Af Amer 58 (*)    All other components within normal limits  CBC WITH DIFFERENTIAL/PLATELET - Abnormal; Notable for the following:    Lymphs Abs 0.6 (*)    All other components within normal limits  URINALYSIS, ROUTINE W REFLEX MICROSCOPIC (NOT AT [facility]) - Abnormal; Notable for the following:    Nitrite POSITIVE (*)    Leukocytes, UA SMALL (*)    All other components within normal limits  URINE MICROSCOPIC-ADD ON - Abnormal; Notable for the following:    Squamous Epithelial / LPF 0-5 (*)    Bacteria, UA MANY (*)    All other components within normal limits  I-STAT CHEM 8, ED - Abnormal; Notable for the following:    Sodium 122 (*)    Chloride 85 (*)    Glucose, Bld 102 (*)    All other components within normal limits  URINE CULTURE  LIPASE, BLOOD  HEPATITIS B SURFACE ANTIBODY, QUANTITATIVE  HEPATITIS B SURFACE ANTIGEN  HEPATITIS A ANTIBODY, TOTAL  HEPATITIS C ANTIBODY  PROTIME-INR  I-STAT TROPOININ, ED    Imaging Review Ct Abdomen Pelvis W Contrast  06/18/2016  CLINICAL DATA:  Epigastric pain and nausea and vomiting. EXAM: CT ABDOMEN AND PELVIS WITH CONTRAST TECHNIQUE: Multidetector CT imaging of the abdomen and pelvis was performed using the standard protocol following bolus administration of intravenous contrast. CONTRAST:  1 ISOVUE-300 IOPAMIDOL (ISOVUE-300) INJECTION 61%  COMPARISON:  None. FINDINGS: Lower chest: No acute findings. Aortic and coronary artery atherosclerosis. Hepatobiliary: There is nodularity of the liver contour with parenchymal scarring at the lateral aspect of the dome of the right lobe of the liver. Gallbladder has been removed. No bile duct dilatation. Calcified granuloma in the left lobe of the liver. Pancreas: No mass, inflammatory changes, or other significant abnormality. Spleen: Within normal limits in size and appearance. Adrenals/Urinary Tract: The ureters are slightly dilated into the pelvis where they are compressed by a distended urinary bladder. No solid mass lesions or stones. 11 mm cyst in the lower pole of the left kidney. Stomach/Bowel: Diverticulosis of the proximal sigmoid portion of the colon. No diverticulitis. Terminal ileum and appendix  are normal. Vascular/Lymphatic: Aortic atherosclerosis. Reproductive: No mass or other significant abnormality. Other: No free air or free fluid. Musculoskeletal: No acute abnormalities. Degenerative disc and joint disease in the lumbar spine. IMPRESSION: No acute abnormalities. Cirrhosis. Scarring in the right lobe of the liver. Sigmoid diverticulosis. Aortic atherosclerosis. Electronically Signed   By: Francene Boyers M.D.   On: 06/18/2016 11:18   I have personally reviewed and evaluated these images and lab results as part of my medical decision-making.   EKG Interpretation   Date/Time:  Monday June 18 2016 07:23:00 EDT Ventricular Rate:  66 PR Interval:    QRS Duration: 143 QT Interval:  471 QTC Calculation: 494 R Axis:   -34 Text Interpretation:  Sinus rhythm Prolonged PR interval Right bundle  branch block Left ventricular hypertrophy Artifact Confirmed by Lincoln Brigham  [phone]) on 06/18/2016 7:56:56 AM Also confirmed by Lincoln Brigham [phone]),  editor Wandalee Ferdinand [phone])  on 06/18/2016 8:49:23 AM      MDM   Final diagnoses:  Hyponatremia  Acute UTI    Patient here for evaluation of  nausea, decreased appetite. UA is consistent with UTI, treated with Cipro given her Keflex allergy. BMP demonstrates hypernatremia. No significant change in her sodium following IV fluid hydration. Given decreased oral intake, new hyponatremia with UTI plan to admit for observation. Hospitalist consulted for admission.    Katherine Fossa, MD 06/18/16 1530

## 2016-06-19 DIAGNOSIS — F419 Anxiety disorder, unspecified: Secondary | ICD-10-CM | POA: Diagnosis present

## 2016-06-19 DIAGNOSIS — K7460 Unspecified cirrhosis of liver: Secondary | ICD-10-CM | POA: Diagnosis not present

## 2016-06-19 DIAGNOSIS — N390 Urinary tract infection, site not specified: Secondary | ICD-10-CM | POA: Diagnosis not present

## 2016-06-19 DIAGNOSIS — R1013 Epigastric pain: Secondary | ICD-10-CM | POA: Diagnosis not present

## 2016-06-19 DIAGNOSIS — E860 Dehydration: Secondary | ICD-10-CM | POA: Diagnosis present

## 2016-06-19 DIAGNOSIS — E559 Vitamin D deficiency, unspecified: Secondary | ICD-10-CM | POA: Diagnosis present

## 2016-06-19 DIAGNOSIS — E119 Type 2 diabetes mellitus without complications: Secondary | ICD-10-CM | POA: Diagnosis present

## 2016-06-19 DIAGNOSIS — Z79899 Other long term (current) drug therapy: Secondary | ICD-10-CM | POA: Diagnosis not present

## 2016-06-19 DIAGNOSIS — K219 Gastro-esophageal reflux disease without esophagitis: Secondary | ICD-10-CM | POA: Diagnosis present

## 2016-06-19 DIAGNOSIS — Z881 Allergy status to other antibiotic agents status: Secondary | ICD-10-CM | POA: Diagnosis not present

## 2016-06-19 DIAGNOSIS — R109 Unspecified abdominal pain: Secondary | ICD-10-CM | POA: Diagnosis present

## 2016-06-19 DIAGNOSIS — E861 Hypovolemia: Secondary | ICD-10-CM | POA: Diagnosis present

## 2016-06-19 DIAGNOSIS — E871 Hypo-osmolality and hyponatremia: Secondary | ICD-10-CM | POA: Diagnosis not present

## 2016-06-19 DIAGNOSIS — F329 Major depressive disorder, single episode, unspecified: Secondary | ICD-10-CM | POA: Diagnosis present

## 2016-06-19 DIAGNOSIS — Z9049 Acquired absence of other specified parts of digestive tract: Secondary | ICD-10-CM | POA: Diagnosis not present

## 2016-06-19 DIAGNOSIS — E039 Hypothyroidism, unspecified: Secondary | ICD-10-CM | POA: Diagnosis not present

## 2016-06-19 DIAGNOSIS — E876 Hypokalemia: Secondary | ICD-10-CM | POA: Diagnosis not present

## 2016-06-19 DIAGNOSIS — I10 Essential (primary) hypertension: Secondary | ICD-10-CM | POA: Diagnosis not present

## 2016-06-19 DIAGNOSIS — Z7982 Long term (current) use of aspirin: Secondary | ICD-10-CM | POA: Diagnosis not present

## 2016-06-19 DIAGNOSIS — E785 Hyperlipidemia, unspecified: Secondary | ICD-10-CM | POA: Diagnosis present

## 2016-06-19 LAB — OSMOLALITY, URINE: OSMOLALITY UR: 268 mosm/kg — AB (ref 300–900)

## 2016-06-19 LAB — CBC
HCT: 36.8 % (ref 36.0–46.0)
Hemoglobin: 12.8 g/dL (ref 12.0–15.0)
MCH: 32.7 pg (ref 26.0–34.0)
MCHC: 34.8 g/dL (ref 30.0–36.0)
MCV: 94.1 fL (ref 78.0–100.0)
PLATELETS: 157 10*3/uL (ref 150–400)
RBC: 3.91 MIL/uL (ref 3.87–5.11)
RDW: 12.5 % (ref 11.5–15.5)
WBC: 5.2 10*3/uL (ref 4.0–10.5)

## 2016-06-19 LAB — BASIC METABOLIC PANEL
Anion gap: 6 (ref 5–15)
BUN: 13 mg/dL (ref 6–20)
CALCIUM: 9.4 mg/dL (ref 8.9–10.3)
CHLORIDE: 94 mmol/L — AB (ref 101–111)
CO2: 26 mmol/L (ref 22–32)
CREATININE: 0.82 mg/dL (ref 0.44–1.00)
GFR calc non Af Amer: >60 mL/min (ref >60)
Glucose, Bld: 97 mg/dL (ref 65–99)
Potassium: 4 mmol/L (ref 3.5–5.1)
SODIUM: 126 mmol/L — AB (ref 135–145)

## 2016-06-19 LAB — HEPATITIS B SURFACE ANTIGEN: Hepatitis B Surface Ag: NEGATIVE

## 2016-06-19 LAB — URINE CULTURE

## 2016-06-19 LAB — OSMOLALITY: Osmolality: 264 mOsm/kg — ABNORMAL LOW (ref 275–295)

## 2016-06-19 LAB — HEPATITIS B SURFACE ANTIBODY, QUANTITATIVE: Hepatitis B-Post: <3.1 m[IU]/mL — ABNORMAL LOW (ref >9.9)

## 2016-06-19 LAB — TSH: TSH: 5.16 u[IU]/mL — ABNORMAL HIGH (ref 0.350–4.500)

## 2016-06-19 LAB — SODIUM, URINE, RANDOM: SODIUM UR: 69 mmol/L

## 2016-06-19 LAB — HEPATITIS C ANTIBODY: HCV Ab: <0.1 s/co ratio (ref 0.0–0.9)

## 2016-06-19 LAB — HEPATITIS A ANTIBODY, TOTAL: Hep A Total Ab: POSITIVE — AB

## 2016-06-19 NOTE — Care Management Obs Status (Signed)
MEDICARE OBSERVATION STATUS NOTIFICATION   Patient Details  Name: Katherine Tyler MRN: [id number] Date of Birth: 06/12/1929   Medicare Observation Status Notification Given:  Yes    Lawerance Sabalebbie Mikail Goostree, RN 06/19/2016, 11:43 AM

## 2016-06-19 NOTE — Care Management Note (Addendum)
Case Management Note  Patient Details  Name: Katherine IslamMary Tyler MRN: [id number] Date of Birth: 08/28/1929  Subjective/Objective:                 Spoke with patient at the bedside. Patient in obs for low sodium, UTI. Patient lives at home alone, has frequesnt checks from her 5 children. She has walker and cane. [facility]. PT eval pending.  Action/Plan:  CM will continue to follow for DC planning.   Addendum- [facility]   Expected Discharge Date:                  Expected Discharge Plan:  Home w Home Health Services  In-House Referral:     Discharge planning Services  CM Consult  Post Acute Care Choice:    Choice offered to:     DME Arranged:    DME Agency:     HH Arranged:    HH Agency:     Status of Service:  In process, will continue to follow  If discussed at Long Length of Stay Meetings, dates discussed:    Additional Comments:  Lawerance SabalDebbie Delbert Vu, RN 06/19/2016, 11:44 AM

## 2016-06-19 NOTE — Evaluation (Signed)
Physical Therapy Evaluation Patient Details Name: Katherine Tyler MRN: [id number] DOB: 07-08-1929 Today's Date: 06/19/2016   History of Present Illness  Patient is a 82 y/o female with hx of HTN, HLD, DM, obesity presents with nausea. Found to have hyponatremia and UTI.  Clinical Impression  Patient presents with generalized weakness and mild balance deficits impacting mobility. Tolerated short distance ambulation with Min guard assist for safety but does require assist to stand/steady from low surfaces. Pt with LOB upon standing- reports 2 falls happened this way. Pt is Mod I PTA and has assist with IADLs from family. Will follow acutely to maximize independence and mobility prior to return home.     Follow Up Recommendations Home health PT;Supervision for mobility/OOB    Equipment Recommendations  None recommended by PT    Recommendations for Other Services OT consult     Precautions / Restrictions Precautions Precautions: Fall Restrictions Weight Bearing Restrictions: No      Mobility  Bed Mobility Overal bed mobility: Needs Assistance Bed Mobility: Sit to Supine       Sit to supine: Supervision;HOB elevated   General bed mobility comments: No assist needed. Use of rails for repositioning.  Transfers Overall transfer level: Needs assistance Equipment used: Rolling walker (2 wheeled) Transfers: Sit to/from Stand Sit to Stand: Mod assist;Min assist         General transfer comment: Able to stand from recliner without assist but Min A for steadying due to posterior LOB. Mod A to stand from low toilet and grab bar.  Ambulation/Gait Ambulation/Gait assistance: Min guard Ambulation Distance (Feet): 30 Feet (+ 15') Assistive device: Rolling walker (2 wheeled) Gait Pattern/deviations: Step-through pattern;Decreased stride length;Wide base of support;Trunk flexed Gait velocity: decreased Gait velocity interpretation: <1.8 ft/sec, indicative of risk for recurrent  falls General Gait Details: Slow, mildly unsteady gait but no overt LOB. SOB.  Stairs            Wheelchair Mobility    Modified Rankin (Stroke Patients Only)       Balance Overall balance assessment: Needs assistance;History of Falls Sitting-balance support: Feet supported;No upper extremity supported Sitting balance-Leahy Scale: Fair     Standing balance support: During functional activity Standing balance-Leahy Scale: Poor Standing balance comment: Reliant on BUEs for support.                              Pertinent Vitals/Pain Pain Assessment: No/denies pain    Home Living Family/patient expects to be discharged to:: Private residence Living Arrangements: Alone Available Help at Discharge: Family;Available PRN/intermittently Type of Home: House Home Access: Stairs to enter Entrance Stairs-Rails: None Entrance Stairs-Number of Steps: 2 (always has her son with her going up/down the stairs) Home Layout: One level Home Equipment: Walker - 2 wheels;Cane - single point;Bedside commode;Shower seat      Prior Function Level of Independence: Independent with assistive device(s)         Comments: Uses RW for ambulation.Does her own ADLs. Reports recent fall 2 months ago. Son lives 5 mins away.     Hand Dominance        Extremity/Trunk Assessment   Upper Extremity Assessment: Defer to OT evaluation           Lower Extremity Assessment: Generalized weakness         Communication   Communication: No difficulties  Cognition Arousal/Alertness: Awake/alert Behavior During Therapy: WFL for tasks assessed/performed Overall Cognitive Status: Within Functional Limits for  tasks assessed                      General Comments      Exercises        Assessment/Plan    PT Assessment Patient needs continued PT services  PT Diagnosis Difficulty walking;Generalized weakness   PT Problem List Decreased strength;Cardiopulmonary status  limiting activity;Decreased balance;Decreased mobility;Decreased activity tolerance;Decreased safety awareness  PT Treatment Interventions Balance training;Gait training;Functional mobility training;Therapeutic activities;Therapeutic exercise;Patient/family education;Stair training;DME instruction   PT Goals (Current goals can be found in the Care Plan section) Acute Rehab PT Goals Patient Stated Goal: to go home PT Goal Formulation: With patient Time For Goal Achievement: 07/03/16 Potential to Achieve Goals: Fair    Frequency Min 3X/week   Barriers to discharge Decreased caregiver support lives alone    Co-evaluation               End of Session Equipment Utilized During Treatment: Gait belt Activity Tolerance: Patient tolerated treatment well Patient left: in bed;with call bell/phone within reach;with bed alarm set Nurse Communication: Mobility status    Functional Assessment Tool Used: clinical judgment Functional Limitation: Mobility: Walking and moving around Mobility: Walking and Moving Around Current Status ([id number](G8978): At least 40 percent but less than 60 percent impaired, limited or restricted Mobility: Walking and Moving Around Goal Status [phone](G8979): At least 1 percent but less than 20 percent impaired, limited or restricted    Time: 1620-1640 PT Time Calculation (min) (ACUTE ONLY): 20 min   Charges:   PT Evaluation $PT Eval Moderate Complexity: 1 Procedure     PT G Codes:   PT G-Codes **NOT FOR INPATIENT CLASS** Functional Assessment Tool Used: clinical judgment Functional Limitation: Mobility: Walking and moving around Mobility: Walking and Moving Around Current Status ([id number](G8978): At least 40 percent but less than 60 percent impaired, limited or restricted Mobility: Walking and Moving Around Goal Status [phone](G8979): At least 1 percent but less than 20 percent impaired, limited or restricted    Neldon Shepard A Katheryne Gorr 06/19/2016, 4:43 PM Mylo RedShauna Nealy Hickmon, PT,  DPT [phone]

## 2016-06-19 NOTE — Progress Notes (Signed)
[facility]                                                                              Patient Demographics  Katherine Tyler, is a 82 y.o. female, DOB - 10/24/1929, ZOX:[id number]  Admit date - 06/18/2016   Admitting Physician Ozella Rocks, MD  Outpatient Primary MD for the patient is Jannifer Rodney, FNP  Outpatient specialists:   LOS -   days    Chief Complaint  Patient presents with  . Abdominal Pain       Brief summary   Katherine Tyler is a 82 y.o. female with medical history significant for morbid obesity, DM 2, and thyroid disease, Presented with 3 day history of nausea, without vomiting, no abdominal pain or any diarrhea. She had eaten a few small meals over the last few days but fluid intake has been minimal. No fevers or chills. Urinating at home. No dysuria.    Assessment & Plan    Nausea, possibly secondary to UTI.  -Continue symptomatic treatment, tolerating clear liquid diet - advance diet to solids  Hyponatremia.  - Hypovolemic hypo-natremia, improving with IV fluid hydration - serum osmolarity 264, urine also D2 68 - Follow BMET closely  Hypokalemia.  - Resolved  Possible cirrhosis by CTscan. Normal LFTs, platelets borderline low.  -check coags, HBV surface antigen, HBV surface ab (for immunity), HCV and HAV total (for immunity).  -If she does have cirrhosis, there is no evidence for decompensation at present and this can be followed up outpatient.   Urinary tract infection -Urine culture showed multiple species -continue cipro started in ED (Rocephin not ordered based on allergies)  Depression. Stable . -Continue Celexa  Hypertension. Controlled.. -Continue home anti-hypertension medications  Hypothyroidism. Thyroid studies early June suggested over replacement with slightly depressed TSH at 0.129 and T4 13. Synthroid dose was decreased to 0.129mcgs -Continue home Synthroid   Code  Status: Full CODE STATUS DVT Prophylaxis:  Lovenox  Family Communication: Discussed in detail with the patient, all imaging results, lab results explained to the patient    Disposition Plan: hopefully dc in am, if Na improving and tolerating diet  Time Spent in minutes 25 minutes  Procedures:  None   Consultants:   None   Antimicrobials:   Cipro  7/10>   Medications  Scheduled Meds: . aspirin EC  81 mg Oral Daily  . chlorthalidone  12.5 mg Oral Daily  . ciprofloxacin  500 mg Oral BID  . citalopram  20 mg Oral Daily  . cloNIDine  0.1 mg Oral Daily  . diltiazem  180 mg Oral BID  . enoxaparin (LOVENOX) injection  40 mg Subcutaneous Q24H  . famotidine  20 mg Oral Daily  . levothyroxine  112 mcg Oral QAC breakfast  . losartan  100 mg Oral Daily  . magnesium oxide  400 mg Oral Daily  . metoprolol succinate  50 mg Oral Daily   Continuous Infusions: . sodium chloride 50 mL/hr at 06/18/16 1602   PRN Meds:.acetaminophen **OR** acetaminophen, bisacodyl, ondansetron **OR** ondansetron (ZOFRAN) IV, polyethylene glycol, traZODone   Antibiotics   Anti-infectives    Start  Dose/Rate Route Frequency Ordered Stop   06/18/16 1800  ciprofloxacin (CIPRO) tablet 500 mg     500 mg Oral 2 times daily 06/18/16 1523     06/18/16 0845  ciprofloxacin (CIPRO) tablet 500 mg     500 mg Oral  Once 06/18/16 0839 06/18/16 0844        Subjective:   Gaston IslamMary Ribaudo was seen and examined today.  Feeling a lot better.  Patient denies dizziness, chest pain, shortness of breath, abdominal pain, N/V/D/C, new weakness, numbess, tingling. No acute events overnight.  Sitting up in chair.  Objective:   Filed Vitals:   06/18/16 2142 06/18/16 2200 06/19/16 0538 06/19/16 0850  BP: 140/45 162/52 144/78 160/61  Pulse: 61 63 65 65  Temp:  97.9 F (36.6 C) 98.2 F (36.8 C)   TempSrc:  Oral Oral   Resp:  18 16   Weight:   94.8 kg (208 lb 15.9 oz)   SpO2: 97% 94% 95%     Intake/Output Summary (Last  24 hours) at 06/19/16 1309 Last data filed at 06/19/16 0939  Gross per 24 hour  Intake   1200 ml  Output    300 ml  Net    900 ml     Wt Readings from Last 3 Encounters:  06/19/16 94.8 kg (208 lb 15.9 oz)  05/17/16 97.977 kg (216 lb)  01/16/16 97.523 kg (215 lb)     Exam  General: Alert and oriented x 3, NAD  HEENT:   Neck: Supple, no JVD  Cardiovascular: S1 S2 auscultated, no rubs, murmurs or gallops. Regular rate and rhythm.  Respiratory: Clear to auscultation bilaterally, no wheezing, rales or rhonchi  Gastrointestinal: Soft, nontender, nondistended, + bowel sounds  Ext: no cyanosis clubbing or edema  Neuro: AAOx3, Cr N's II- XII. Strength 5/5 upper and lower extremities bilaterally  Skin: No rashes  Psych: Normal affect and demeanor, alert and oriented x3    Data Reviewed:  I have personally reviewed following labs and imaging studies  Micro Results Recent Results (from the past 240 hour(s))  Urine culture     Status: Abnormal   Collection Time: 06/18/16  8:05 AM  Result Value Ref Range Status   Specimen Description URINE, CLEAN CATCH  Final   Special Requests ADDED [id number]  Final   Culture MULTIPLE SPECIES PRESENT, SUGGEST RECOLLECTION (A)  Final   Report Status 06/19/2016 FINAL  Final    Radiology Reports Ct Abdomen Pelvis W Contrast  06/18/2016  CLINICAL DATA:  Epigastric pain and nausea and vomiting. EXAM: CT ABDOMEN AND PELVIS WITH CONTRAST TECHNIQUE: Multidetector CT imaging of the abdomen and pelvis was performed using the standard protocol following bolus administration of intravenous contrast. CONTRAST:  1 ISOVUE-300 IOPAMIDOL (ISOVUE-300) INJECTION 61% COMPARISON:  None. FINDINGS: Lower chest: No acute findings. Aortic and coronary artery atherosclerosis. Hepatobiliary: There is nodularity of the liver contour with parenchymal scarring at the lateral aspect of the dome of the right lobe of the liver. Gallbladder has been removed. No bile duct  dilatation. Calcified granuloma in the left lobe of the liver. Pancreas: No mass, inflammatory changes, or other significant abnormality. Spleen: Within normal limits in size and appearance. Adrenals/Urinary Tract: The ureters are slightly dilated into the pelvis where they are compressed by a distended urinary bladder. No solid mass lesions or stones. 11 mm cyst in the lower pole of the left kidney. Stomach/Bowel: Diverticulosis of the proximal sigmoid portion of the colon. No diverticulitis. Terminal ileum and appendix are  normal. Vascular/Lymphatic: Aortic atherosclerosis. Reproductive: No mass or other significant abnormality. Other: No free air or free fluid. Musculoskeletal: No acute abnormalities. Degenerative disc and joint disease in the lumbar spine. IMPRESSION: No acute abnormalities. Cirrhosis. Scarring in the right lobe of the liver. Sigmoid diverticulosis. Aortic atherosclerosis. Electronically Signed   By: Francene Boyers M.D.   On: 06/18/2016 11:18    Lab Data:  CBC:  Recent Labs Lab 06/18/16 0742 06/18/16 1243 06/19/16 0710  WBC 6.0  --  5.2  NEUTROABS 4.9  --   --   HGB 12.9 13.6 12.8  HCT 36.5 40.0 36.8  MCV 91.7  --  94.1  PLT 154  --  157   Basic Metabolic Panel:  Recent Labs Lab 06/18/16 0742 06/18/16 1243 06/18/16 1603 06/18/16 2023 06/19/16 0710  NA 122* 122*  --  124* 126*  K 3.1* 3.6  --  4.2 4.0  CL 87* 85*  --  91* 94*  CO2 26  --   --  27 26  GLUCOSE 115* 102*  --  123* 97  BUN 16 17  --  13 13  CREATININE 0.88 0.90  --  0.95 0.82  CALCIUM 9.5  --   --  9.3 9.4  MG  --   --  1.9  --   --    GFR: Estimated Creatinine Clearance: 56 mL/min (by C-G formula based on Cr of 0.82). Liver Function Tests:  Recent Labs Lab 06/18/16 0742  AST 26  ALT 20  ALKPHOS 59  BILITOT 0.8  PROT 6.9  ALBUMIN 3.7    Recent Labs Lab 06/18/16 0742  LIPASE 25   No results for input(s): AMMONIA in the last 168 hours. Coagulation Profile:  Recent Labs Lab  06/18/16 1603  INR 1.10   Cardiac Enzymes: No results for input(s): CKTOTAL, CKMB, CKMBINDEX, TROPONINI in the last 168 hours. BNP (last 3 results) No results for input(s): PROBNP in the last 8760 hours. HbA1C: No results for input(s): HGBA1C in the last 72 hours. CBG: No results for input(s): GLUCAP in the last 168 hours. Lipid Profile: No results for input(s): CHOL, HDL, LDLCALC, TRIG, CHOLHDL, LDLDIRECT in the last 72 hours. Thyroid Function Tests:  Recent Labs  06/19/16 0718  TSH 5.160*   Anemia Panel: No results for input(s): VITAMINB12, FOLATE, FERRITIN, TIBC, IRON, RETICCTPCT in the last 72 hours. Urine analysis:    Component Value Date/Time   COLORURINE YELLOW 06/18/2016 0805   APPEARANCEUR CLEAR 06/18/2016 0805   LABSPEC 1.010 06/18/2016 0805   PHURINE 7.5 06/18/2016 0805   GLUCOSEU NEGATIVE 06/18/2016 0805   HGBUR NEGATIVE 06/18/2016 0805   BILIRUBINUR NEGATIVE 06/18/2016 0805   KETONESUR NEGATIVE 06/18/2016 0805   PROTEINUR NEGATIVE 06/18/2016 0805   UROBILINOGEN 1.0 03/08/2008 1015   NITRITE POSITIVE* 06/18/2016 0805   LEUKOCYTESUR SMALL* 06/18/2016 0805     Bolivar Koranda M.D. [facility] 06/19/2016, 1:09 PM  Pager: [phone] Between 7am to 7pm - call Pager - [phone]  After 7pm go to [email or web address] - password [email or web address]  Call night coverage person covering after 7pm

## 2016-06-20 DIAGNOSIS — E039 Hypothyroidism, unspecified: Secondary | ICD-10-CM

## 2016-06-20 DIAGNOSIS — K7460 Unspecified cirrhosis of liver: Secondary | ICD-10-CM

## 2016-06-20 DIAGNOSIS — N390 Urinary tract infection, site not specified: Principal | ICD-10-CM

## 2016-06-20 DIAGNOSIS — E876 Hypokalemia: Secondary | ICD-10-CM

## 2016-06-20 DIAGNOSIS — R110 Nausea: Secondary | ICD-10-CM

## 2016-06-20 DIAGNOSIS — E871 Hypo-osmolality and hyponatremia: Secondary | ICD-10-CM

## 2016-06-20 LAB — BASIC METABOLIC PANEL
ANION GAP: 7 (ref 5–15)
ANION GAP: 9 (ref 5–15)
BUN: 9 mg/dL (ref 6–20)
BUN: 10 mg/dL (ref 6–20)
CALCIUM: 9.3 mg/dL (ref 8.9–10.3)
CALCIUM: 8.7 mg/dL — AB (ref 8.9–10.3)
CO2: 24 mmol/L (ref 22–32)
CO2: 19 mmol/L — AB (ref 22–32)
CREATININE: 0.83 mg/dL (ref 0.44–1.00)
CREATININE: 0.92 mg/dL (ref 0.44–1.00)
Chloride: 94 mmol/L — ABNORMAL LOW (ref 101–111)
Chloride: 96 mmol/L — ABNORMAL LOW (ref 101–111)
GFR calc Af Amer: >60 mL/min (ref >60)
GFR calc Af Amer: >60 mL/min (ref >60)
GFR, EST NON AFRICAN AMERICAN: 55 mL/min — AB (ref >60)
GLUCOSE: 106 mg/dL — AB (ref 65–99)
GLUCOSE: 93 mg/dL (ref 65–99)
Potassium: 3.5 mmol/L (ref 3.5–5.1)
Potassium: 4.1 mmol/L (ref 3.5–5.1)
Sodium: 125 mmol/L — ABNORMAL LOW (ref 135–145)
Sodium: 124 mmol/L — ABNORMAL LOW (ref 135–145)

## 2016-06-20 MED ORDER — FUROSEMIDE 10 MG/ML IJ SOLN
20.0000 mg | Freq: Once | INTRAMUSCULAR | Status: AC
  Administered 2016-06-20: 20 mg via INTRAVENOUS
  Filled 2016-06-20: qty 2

## 2016-06-20 MED ORDER — SODIUM CHLORIDE 0.9 % IV BOLUS (SEPSIS)
500.0000 mL | Freq: Once | INTRAVENOUS | Status: AC
  Administered 2016-06-20: 500 mL via INTRAVENOUS

## 2016-06-20 MED ORDER — POTASSIUM CHLORIDE CRYS ER 20 MEQ PO TBCR
40.0000 meq | EXTENDED_RELEASE_TABLET | Freq: Once | ORAL | Status: AC
  Administered 2016-06-20: 40 meq via ORAL
  Filled 2016-06-20: qty 2

## 2016-06-20 NOTE — Care Management Important Message (Signed)
Important Message  Patient Details  Name: Katherine IslamMary Tyler MRN: [id number] Date of Birth: 07/13/1929   Medicare Important Message Given:  Yes    [name] [name] 06/20/2016, 10:27 AM

## 2016-06-20 NOTE — Progress Notes (Signed)
PROGRESS NOTE    Katherine Tyler  WUJ:[id number] DOB: Aug 10, 1929 DOA: 06/18/2016 PCP: Katherine Rodney, FNP    Brief Narrative:  Katherine Tyler is a 82 y.o. female with medical history significant for morbid obesity, DM 2, and thyroid disease, Presented with 3 day history of nausea, without vomiting, no abdominal pain or any diarrhea. She had eaten a few small meals over the last few days but fluid intake has been minimal. No fevers or chills. Urinating at home. No dysuria.   Assessment & Plan:   Principal Problem:   Hyponatremia Active Problems:   HTN (hypertension)   Hypothyroidism   Nausea without vomiting   UTI (urinary tract infection)   Hepatic cirrhosis (HCC)   Hypokalemia   Acute UTI   UTI (lower urinary tract infection)  #1 hyponatremia Questionable etiology. Initially felt to be secondary to hypovolemic hyponatremia has patient had poor oral intake with nausea and vomiting. Sodium levels with no significant improvement and currently at 125 this morning. Patient oral intake is improving. Serum osmolality was 264. Urine sodium was 69. Urine osmolality was 268. Urinalysis was consistent with a UTI. Patient given a bolus of IV fluids this morning and IV fluids increased to 100 mL per hour with no significant improvement with hyponatremia. Will discontinue thiazide diuretic. Discontinue Cozaar. Will saline lock IV fluids. Will give a dose of Lasix 20 mg IV 1. Follow.  #2 urinary tract infection Urine cultures with multiple species present. Continue ciprofloxacin.  #3 nausea Likely secondary to problem #2. Resolved. Tolerating current diet.  #4 hypokalemia Repleted.  #5 Possible cirrhosis Per CT scan. LFTs normal. Hepatitis B surface antigen and surface antibody negative. INR is normal at 1.10. Hepatitis A antibody positive. Patient does not appear decompensated. Follow.  #6 hypertension Stable. Continue current antihypertensive regimen.  #7 hypothyroidism Continue home dose  Synthroid.   DVT prophylaxis: Lovenox. Code Status: Full Family Communication: Updated patient and daughter at bedside. Disposition Plan: Home once hyponatremia has resolved.   Consultants:   None  Procedures:   CT abdomen and pelvis 06/18/2016  Antimicrobials:   Ciprofloxacin 06/18/2016   Subjective: Patient sitting up in bed eating lunch. No nausea. No vomiting. No abdominal pain. Feeling better.  Objective: Filed Vitals:   06/19/16 1455 06/19/16 2036 06/20/16 0523 06/20/16 1453  BP: 149/44 167/62 168/58 161/41  Pulse: 42 62 66 50  Temp: 98 F (36.7 C) 98.4 F (36.9 C) 98.2 F (36.8 C) 97.5 F (36.4 C)  TempSrc: Oral Oral Oral Oral  Resp: Weight:   92.942 kg (204 lb 14.4 oz)   SpO2: 99% 98% 96% 99%    Intake/Output Summary (Last 24 hours) at 06/20/16 1924 Last data filed at 06/20/16 1858  Gross per 24 hour  Intake 2674.17 ml  Output      0 ml  Net 2674.17 ml   Filed Weights   06/18/16 0659 06/19/16 0538 06/20/16 0523  Weight: 111.131 kg (245 lb) 94.8 kg (208 lb 15.9 oz) 92.942 kg (204 lb 14.4 oz)    Examination:  General exam: Appears calm and comfortable  Respiratory system: Clear to auscultation. Respiratory effort normal. Cardiovascular system: S1 & S2 heard, RRR. No JVD, murmurs, rubs, gallops or clicks. No pedal edema. Gastrointestinal system: Abdomen is nondistended, soft and nontender. No organomegaly or masses felt. Normal bowel sounds heard. Central nervous system: Alert and oriented. No focal neurological deficits. Extremities: Symmetric 5 x 5 power. Skin: No rashes, lesions or ulcers Psychiatry: Judgement and  insight appear normal. Mood & affect appropriate.     Data Reviewed: I have personally reviewed following labs and imaging studies  CBC:  Recent Labs Lab 06/18/16 0742 06/18/16 1243 06/19/16 0710  WBC 6.0  --  5.2  NEUTROABS 4.9  --   --   HGB 12.9 13.6 12.8  HCT 36.5 40.0 36.8  MCV 91.7  --  94.1  PLT 154   --  157   Basic Metabolic Panel:  Recent Labs Lab 06/18/16 0742 06/18/16 1243 06/18/16 1603 06/18/16 2023 06/19/16 0710 06/20/16 0607 06/20/16 1611  NA 122* 122*  --  124* 126* 125* 124*  K 3.1* 3.6  --  4.2 4.0 3.5 4.1  CL 87* 85*  --  91* 94* 94* 96*  CO2 26  --   --  27 26 24  19*  GLUCOSE 115* 102*  --  123* 97 106* 93  BUN 16 17  --  13 13 9 10   CREATININE 0.88 0.90  --  0.95 0.82 0.83 0.92  CALCIUM 9.5  --   --  9.3 9.4 9.3 8.7*  MG  --   --  1.9  --   --   --   --    GFR: Estimated Creatinine Clearance: 49.4 mL/min (by C-G formula based on Cr of 0.92). Liver Function Tests:  Recent Labs Lab 06/18/16 0742  AST 26  ALT 20  ALKPHOS 59  BILITOT 0.8  PROT 6.9  ALBUMIN 3.7    Recent Labs Lab 06/18/16 0742  LIPASE 25   No results for input(s): AMMONIA in the last 168 hours. Coagulation Profile:  Recent Labs Lab 06/18/16 1603  INR 1.10   Cardiac Enzymes: No results for input(s): CKTOTAL, CKMB, CKMBINDEX, TROPONINI in the last 168 hours. BNP (last 3 results) No results for input(s): PROBNP in the last 8760 hours. HbA1C: No results for input(s): HGBA1C in the last 72 hours. CBG: No results for input(s): GLUCAP in the last 168 hours. Lipid Profile: No results for input(s): CHOL, HDL, LDLCALC, TRIG, CHOLHDL, LDLDIRECT in the last 72 hours. Thyroid Function Tests:  Recent Labs  06/19/16 0718  TSH 5.160*   Anemia Panel: No results for input(s): VITAMINB12, FOLATE, FERRITIN, TIBC, IRON, RETICCTPCT in the last 72 hours. Sepsis Labs: No results for input(s): PROCALCITON, LATICACIDVEN in the last 168 hours.  Recent Results (from the past 240 hour(s))  Urine culture     Status: Abnormal   Collection Time: 06/18/16  8:05 AM  Result Value Ref Range Status   Specimen Description URINE, CLEAN CATCH  Final   Special Requests ADDED [phone] [id number]  Final   Culture MULTIPLE SPECIES PRESENT, SUGGEST RECOLLECTION (A)  Final   Report Status 06/19/2016 FINAL  Final          Radiology Studies: No results found.      Scheduled Meds: . aspirin EC  81 mg Oral Daily  . chlorthalidone  12.5 mg Oral Daily  . ciprofloxacin  500 mg Oral BID  . citalopram  20 mg Oral Daily  . cloNIDine  0.1 mg Oral Daily  . diltiazem  180 mg Oral BID  . enoxaparin (LOVENOX) injection  40 mg Subcutaneous Q24H  . famotidine  20 mg Oral Daily  . levothyroxine  112 mcg Oral QAC breakfast  . losartan  100 mg Oral Daily  . magnesium oxide  400 mg Oral Daily  . metoprolol succinate  50 mg Oral Daily   Continuous Infusions: . sodium chloride 100  mL/hr at 06/20/16 1706     LOS: 1 day    Time spent: 35 minutes    Edison Nicholson, MD [facility] Pager [phone]  If 7PM-7AM, please contact night-coverage [email or web address] Password [email or web address] 06/20/2016, 7:24 PM

## 2016-06-20 NOTE — Progress Notes (Signed)
Physical Therapy Treatment Patient Details Name: Gaston IslamMary Perlman MRN: [id number] DOB: 05/24/1929 Today's Date: 06/20/2016    History of Present Illness Patient is a 82 y/o female with hx of HTN, HLD, DM, obesity presents with nausea. Found to have hyponatremia and UTI.    PT Comments    Patient is progressing toward mobility goals. Tolerated gait/stair training with min guard and min A. Continue to progress as tolerated with anticipated d/c home with HHPT.   Follow Up Recommendations  Home health PT;Supervision for mobility/OOB     Equipment Recommendations  None recommended by PT    Recommendations for Other Services OT consult     Precautions / Restrictions Precautions Precautions: Fall Restrictions Weight Bearing Restrictions: No    Mobility  Bed Mobility Overal bed mobility: Needs Assistance Bed Mobility: Supine to Sit;Sit to Supine     Supine to sit: Supervision;HOB elevated Sit to supine: Min guard;Min assist   General bed mobility comments: cues for sequencing and assist to position once in supine and cues for bridging/scooting; use of rails  Transfers Overall transfer level: Needs assistance Equipment used: Rolling walker (2 wheeled) Transfers: Sit to/from Stand Sit to Stand: Mod assist;Min assist         General transfer comment: mod A first trial and min A second trial from EOB; cues for hand placement and technique  Ambulation/Gait Ambulation/Gait assistance: Min guard Ambulation Distance (Feet): 100 Feet Assistive device: Rolling walker (2 wheeled) Gait Pattern/deviations: Step-through pattern;Decreased stride length;Wide base of support Gait velocity: decreased   General Gait Details: cues for bilat heel strike and increased bilat step length; no LOB   Stairs Stairs: Yes Stairs assistance: Min assist Stair Management: One rail Right;Forwards Number of Stairs: 2 General stair comments: handrail R and HHA on L with assist to push up onto steps;  cues for sequencing   Wheelchair Mobility    Modified Rankin (Stroke Patients Only)       Balance     Sitting balance-Leahy Scale: Fair     Standing balance support: During functional activity Standing balance-Leahy Scale: Poor                      Cognition Arousal/Alertness: Awake/alert Behavior During Therapy: WFL for tasks assessed/performed Overall Cognitive Status: Within Functional Limits for tasks assessed                      Exercises      General Comments        Pertinent Vitals/Pain Pain Assessment: No/denies pain    Home Living                      Prior Function            PT Goals (current goals can now be found in the care plan section) Acute Rehab PT Goals Patient Stated Goal: to go home Progress towards PT goals: Progressing toward goals    Frequency  Min 3X/week    PT Plan Current plan remains appropriate    Co-evaluation             End of Session Equipment Utilized During Treatment: Gait belt Activity Tolerance: Patient tolerated treatment well Patient left: in bed;with call bell/phone within reach;with bed alarm set;with family/visitor present     Time: [id number] PT Time Calculation (min) (ACUTE ONLY): 28 min  Charges:  $Gait Training: 8-22 mins $Therapeutic Activity: 8-22 mins  G Codes:      Derek Mound, PTA Pager: [phone]   06/20/2016, 4:20 PM

## 2016-06-21 DIAGNOSIS — I10 Essential (primary) hypertension: Secondary | ICD-10-CM

## 2016-06-21 LAB — BASIC METABOLIC PANEL
Anion gap: 7 (ref 5–15)
BUN: 10 mg/dL (ref 6–20)
CHLORIDE: 99 mmol/L — AB (ref 101–111)
CO2: 22 mmol/L (ref 22–32)
CREATININE: 0.9 mg/dL (ref 0.44–1.00)
Calcium: 9 mg/dL (ref 8.9–10.3)
GFR calc non Af Amer: 57 mL/min — ABNORMAL LOW (ref >60)
Glucose, Bld: 94 mg/dL (ref 65–99)
POTASSIUM: 3.7 mmol/L (ref 3.5–5.1)
Sodium: 128 mmol/L — ABNORMAL LOW (ref 135–145)

## 2016-06-21 MED ORDER — CIPROFLOXACIN HCL 500 MG PO TABS: 500.0000 mg | ORAL_TABLET | Freq: Two times a day (BID) | ORAL | Status: DC

## 2016-06-21 MED ORDER — LOSARTAN POTASSIUM 50 MG PO TABS
100.0000 mg | ORAL_TABLET | Freq: Every day | ORAL | Status: DC
  Administered 2016-06-21: 100 mg via ORAL
  Filled 2016-06-21: qty 2

## 2016-06-21 MED ORDER — DILTIAZEM HCL ER 180 MG PO CP24: 180.0000 mg | ORAL_CAPSULE | Freq: Two times a day (BID) | ORAL | Status: DC

## 2016-06-21 NOTE — Discharge Summary (Signed)
Physician Discharge Summary  Gaston IslamMary Trautner ZOX:[id number] DOB: 03/14/1929 DOA: 06/18/2016  PCP: Jannifer Rodneyhristy Hawks, FNP  Admit date: 06/18/2016 Discharge date: 06/21/2016  Time spent: 65 minutes  Recommendations for Outpatient Follow-up:  1. Follow-up with Jannifer Rodneyhristy Hawks, FNP in 1-2 weeks. On follow up patient will need a repeat basic metabolic profile done to follow-up on electrolytes and renal function. Patient's thiazide diuretic has been discontinued as it was felt to be causing hyponatremia. Blood pressure need to be reassessed.   Discharge Diagnoses:  Principal Problem:   Hyponatremia Active Problems:   HTN (hypertension)   Hypothyroidism   Nausea without vomiting   UTI (urinary tract infection)   Hepatic cirrhosis (HCC)   Hypokalemia   Acute UTI   UTI (lower urinary tract infection)   Essential hypertension   Discharge Condition: Stable and improved  Diet recommendation: Regular  Filed Weights   06/19/16 0538 06/20/16 0523 06/21/16 0603  Weight: 94.8 kg (208 lb 15.9 oz) 92.942 kg (204 lb 14.4 oz) 93.169 kg (205 lb 6.4 oz)    History of present illness:  Per Dr Lurlean NannyMerrell Briana Manas is a 82 y.o. female with medical history significant for but not limited to, morbid obesity, DM 2, and thyroid disease. Patient presented to the emergency department with a three-day history of nausea without vomiting. No abdominal pain. No diarrhea or any bowel changes. No dizziness or chest pain. She had eaten a few small meals over the last few days but fluid intake has been minimal. No fevers or chills. Urinating at home. No dysuria.   Hospital Course:  #1 acute on chronic hyponatremia Likely secondary to chlorthalidone/thiazide diuretic. Baseline Na+= 129-134.  Initially felt to be secondary to hypovolemic hyponatremia has patient had poor oral intake with nausea and vomiting. Patient was placed on IV fluids. Sodium levels with no significant improvement and currently at an fluctuated between 124 and  125. Patient's oral intake improved during the hospitalization. Serum osmolality was 264. Urine sodium was 69. Urine osmolality was 268. Urinalysis was consistent with a UTI. Patient was noted to be on a thiazide diuretic which was subsequently discontinued. Patient's IV fluids were discontinued. Patient was given a dose of Lasix 20 mg IV 1. Patient's hyponatremia improved such that by day of discharge patient's sodium was up to 128. Patient's thiazide diuretic has been discontinued on discharge and patient will follow-up with PCP in the outpatient setting.   #2 urinary tract infection Urine cultures with multiple species present. Patient was placed on ciprofloxacin be discharged home on 4 more days of oral ciprofloxacin to complete a one-week course of antibiotic treatment.   #3 nausea Likely secondary to problem #2. Resolved. Tolerating current diet.  #4 hypokalemia Repleted.  #5 Possible cirrhosis Per CT scan. LFTs normal. Hepatitis B surface antigen and surface antibody negative. INR is normal at 1.10. Hepatitis A antibody positive (likely prior exposure versus prior vaccination as LFTs are within normal limits). Patient did not appear decompensated. Outpatient follow-up. Follow.  #6 hypertension During the hospitalization patient was initially maintained on home regimen of chlorthalidone, Cozaar, Toprol-XL. Due to patient's hyponatremia chlorthalidone was discontinued. Patient was maintained on home regimen of Cozaar, Toprol-XL and diltiazem. Outpatient follow-up.  #7 hypothyroidism Continued on home dose Synthroid.   Procedures:  CT abdomen and pelvis 06/18/2016  Consultations:  None  Discharge Exam: Filed Vitals:   06/20/16 2217 06/21/16 0603  BP: 139/47 160/55  Pulse: 60 63  Temp: 97.6 F (36.4 C) 97.7 F (36.5 C)  Resp: 18  18    General: NAD Cardiovascular: RRR Respiratory: CTAB  Discharge Instructions   Discharge Instructions    Diet general    Complete by:   As directed      Discharge instructions    Complete by:  As directed   Follow up with Jannifer Rodney, FNP in 1 week.     Increase activity slowly    Complete by:  As directed           Discharge Medication List as of 06/21/2016  2:10 PM    START taking these medications   Details  ciprofloxacin (CIPRO) 500 MG tablet Take 1 tablet (500 mg total) by mouth 2 (two) times daily. Take for 4.5 days then stop. First dose tonight 8p, Starting 06/21/2016, Until Discontinued, Print      CONTINUE these medications which have CHANGED   Details  diltiazem (DILACOR XR) 180 MG 24 hr capsule Take 1 capsule (180 mg total) by mouth 2 (two) times daily., Starting 06/21/2016, Until Discontinued, No Print      CONTINUE these medications which have NOT CHANGED   Details  aspirin 81 MG tablet Take 81 mg by mouth daily., Until Discontinued, Historical Med    Cholecalciferol (D3-1000) 1000 UNITS capsule Take 1,000 Units by mouth daily. , Until Discontinued, Historical Med    citalopram (CELEXA) 20 MG tablet Take 1 tablet (20 mg total) by mouth daily., Starting 04/10/2016, Until Discontinued, Normal    cloNIDine (CATAPRES) 0.1 MG tablet Take 1 tablet (0.1 mg total) by mouth daily., Starting 04/30/2016, Until Discontinued, Normal    Ferrous Sulfate Dried (SLOW RELEASE IRON) 45 MG TBCR Take 45 mg by mouth 2 (two) times daily. , Until Discontinued, Historical Med    levothyroxine (SYNTHROID) 112 MCG tablet Take 1 tablet (112 mcg total) by mouth daily before breakfast., Starting 05/20/2016, Until Discontinued, Normal    losartan (COZAAR) 100 MG tablet Take 1 tablet (100 mg total) by mouth daily. Changed by Salomon Mast , MD (kidney doctor), Starting 05/01/2016, Until Discontinued, Normal    magnesium oxide (MAG-OX) 400 (241.3 MG) MG tablet Take 1 tablet (400 mg total) by mouth daily., Starting 02/14/2015, Until Discontinued, Normal    metoprolol succinate (TOPROL-XL) 50 MG 24 hr tablet Take 1 tablet (50 mg total)  by mouth daily. Take with or immediately following a meal., Starting 04/30/2016, Until Discontinued, Normal    thiamine (VITAMIN B-1) 100 MG tablet Take 100 mg by mouth daily., Until Discontinued, Historical Med      STOP taking these medications     chlorthalidone (HYGROTON) 25 MG tablet        Allergies  Allergen Reactions  . Terazosin Hcl Other (See Comments)    Muscle aches  . Keflex [Cephalexin] Other (See Comments)    unknown   Follow-up Information    Follow up with [facility].   Why:  physical therapy. they will you in 1-2 days to set up yur first home health appointment.   Contact information:   [address] [place] [address] [phone]       Follow up with Jannifer Rodney, FNP. Schedule an appointment as soon as possible for a visit in 1 week.   Specialty:  Family Medicine   Contact information:   [address] [place] [address] [phone]        The results of significant diagnostics from this hospitalization (including imaging, microbiology, ancillary and laboratory) are listed below for reference.    Significant  Diagnostic Studies: Ct Abdomen Pelvis W Contrast  06/18/2016  CLINICAL DATA:  Epigastric pain and nausea and vomiting. EXAM: CT ABDOMEN AND PELVIS WITH CONTRAST TECHNIQUE: Multidetector CT imaging of the abdomen and pelvis was performed using the standard protocol following bolus administration of intravenous contrast. CONTRAST:  1 ISOVUE-300 IOPAMIDOL (ISOVUE-300) INJECTION 61% COMPARISON:  None. FINDINGS: Lower chest: No acute findings. Aortic and coronary artery atherosclerosis. Hepatobiliary: There is nodularity of the liver contour with parenchymal scarring at the lateral aspect of the dome of the right lobe of the liver. Gallbladder has been removed. No bile duct dilatation. Calcified granuloma in the left lobe of the liver. Pancreas: No mass, inflammatory changes, or other significant abnormality. Spleen:  Within normal limits in size and appearance. Adrenals/Urinary Tract: The ureters are slightly dilated into the pelvis where they are compressed by a distended urinary bladder. No solid mass lesions or stones. 11 mm cyst in the lower pole of the left kidney. Stomach/Bowel: Diverticulosis of the proximal sigmoid portion of the colon. No diverticulitis. Terminal ileum and appendix are normal. Vascular/Lymphatic: Aortic atherosclerosis. Reproductive: No mass or other significant abnormality. Other: No free air or free fluid. Musculoskeletal: No acute abnormalities. Degenerative disc and joint disease in the lumbar spine. IMPRESSION: No acute abnormalities. Cirrhosis. Scarring in the right lobe of the liver. Sigmoid diverticulosis. Aortic atherosclerosis. Electronically Signed   By: Francene Boyers M.D.   On: 06/18/2016 11:18    Microbiology: Recent Results (from the past 240 hour(s))  Urine culture     Status: Abnormal   Collection Time: 06/18/16  8:05 AM  Result Value Ref Range Status   Specimen Description URINE, CLEAN CATCH  Final   Special Requests ADDED [id number]  Final   Culture MULTIPLE SPECIES PRESENT, SUGGEST RECOLLECTION (A)  Final   Report Status 06/19/2016 FINAL  Final     Labs: Basic Metabolic Panel:  Recent Labs Lab 06/18/16 1603 06/18/16 2023 06/19/16 0710 06/20/16 0607 06/20/16 1611 06/21/16 0559  NA  --  124* 126* 125* 124* 128*  K  --  4.2 4.0 3.5 4.1 3.7  CL  --  91* 94* 94* 96* 99*  CO2  --  27 26 24  19* 22  GLUCOSE  --  123* 97 106* 93 94  BUN  --  13 13 9 10 10   CREATININE  --  0.95 0.82 0.83 0.92 0.90  CALCIUM  --  9.3 9.4 9.3 8.7* 9.0  MG 1.9  --   --   --   --   --    Liver Function Tests:  Recent Labs Lab 06/18/16 0742  AST 26  ALT 20  ALKPHOS 59  BILITOT 0.8  PROT 6.9  ALBUMIN 3.7    Recent Labs Lab 06/18/16 0742  LIPASE 25   No results for input(s): AMMONIA in the last 168 hours. CBC:  Recent Labs Lab 06/18/16 0742 06/18/16 1243  06/19/16 0710  WBC 6.0  --  5.2  NEUTROABS 4.9  --   --   HGB 12.9 13.6 12.8  HCT 36.5 40.0 36.8  MCV 91.7  --  94.1  PLT 154  --  157   Cardiac Enzymes: No results for input(s): CKTOTAL, CKMB, CKMBINDEX, TROPONINI in the last 168 hours. BNP: BNP (last 3 results) No results for input(s): BNP in the last 8760 hours.  ProBNP (last 3 results) No results for input(s): PROBNP in the last 8760 hours.  CBG: No results for input(s): GLUCAP in the last 168 hours.  SignedRamiro Harvest MD.  [facility] 06/21/2016, 7:18 PM

## 2016-06-21 NOTE — Progress Notes (Signed)
Patient was discharged home with home health by MD order; discharged instructions review and give to patient and her daughter with care notes and prescription; IV DIC; skin intact; patient will be escorted to the car by nurse tech via wheelchair.

## 2016-06-22 ENCOUNTER — Telehealth: Payer: Self-pay | Admitting: *Deleted

## 2016-06-22 NOTE — Telephone Encounter (Signed)
Call Completed and Appointment Scheduled: Yes, Date: 07/03/16 with Jannifer Rodneyhristy Hawks, FNP   DISCHARGE INFORMATION Date of Discharge:06/21/16  Discharge Facility: [facility]  Principal Discharge Diagnosis: Hyponatremia  Patient and/or caregiver is knowledgeable of his/her condition(s) and treatment: Yes  MEDICATION RECONCILIATION Current medication list reviewed with patient:Yes Discharge Medications reviewed and reconciled with current medications.yes  Patient is able to obtain needed medications:Yes  ACTIVITIES OF DAILY LIVING  Is the patient able to perform his/her own ADLs: Yes.    Patient is receiving home health services: No.  PATIENT EDUCATION Questions/Concerns Discussed: Discussed medications changes. Patient's family is helping out right now. She has no other concerns at this time and will call back if necessary.

## 2016-06-24 DIAGNOSIS — E119 Type 2 diabetes mellitus without complications: Secondary | ICD-10-CM | POA: Diagnosis not present

## 2016-06-24 DIAGNOSIS — E039 Hypothyroidism, unspecified: Secondary | ICD-10-CM | POA: Diagnosis not present

## 2016-06-24 DIAGNOSIS — F419 Anxiety disorder, unspecified: Secondary | ICD-10-CM | POA: Diagnosis not present

## 2016-06-24 DIAGNOSIS — E785 Hyperlipidemia, unspecified: Secondary | ICD-10-CM | POA: Diagnosis not present

## 2016-06-24 DIAGNOSIS — I10 Essential (primary) hypertension: Secondary | ICD-10-CM | POA: Diagnosis not present

## 2016-06-24 DIAGNOSIS — Z6835 Body mass index (BMI) 35.0-35.9, adult: Secondary | ICD-10-CM | POA: Diagnosis not present

## 2016-06-24 DIAGNOSIS — E559 Vitamin D deficiency, unspecified: Secondary | ICD-10-CM | POA: Diagnosis not present

## 2016-06-24 DIAGNOSIS — N390 Urinary tract infection, site not specified: Secondary | ICD-10-CM | POA: Diagnosis not present

## 2016-06-24 DIAGNOSIS — E669 Obesity, unspecified: Secondary | ICD-10-CM | POA: Diagnosis not present

## 2016-06-26 DIAGNOSIS — N390 Urinary tract infection, site not specified: Secondary | ICD-10-CM | POA: Diagnosis not present

## 2016-06-26 DIAGNOSIS — E559 Vitamin D deficiency, unspecified: Secondary | ICD-10-CM | POA: Diagnosis not present

## 2016-06-26 DIAGNOSIS — E039 Hypothyroidism, unspecified: Secondary | ICD-10-CM | POA: Diagnosis not present

## 2016-06-26 DIAGNOSIS — E785 Hyperlipidemia, unspecified: Secondary | ICD-10-CM | POA: Diagnosis not present

## 2016-06-26 DIAGNOSIS — I10 Essential (primary) hypertension: Secondary | ICD-10-CM | POA: Diagnosis not present

## 2016-06-26 DIAGNOSIS — E119 Type 2 diabetes mellitus without complications: Secondary | ICD-10-CM | POA: Diagnosis not present

## 2016-06-28 DIAGNOSIS — N390 Urinary tract infection, site not specified: Secondary | ICD-10-CM | POA: Diagnosis not present

## 2016-06-28 DIAGNOSIS — I10 Essential (primary) hypertension: Secondary | ICD-10-CM | POA: Diagnosis not present

## 2016-06-28 DIAGNOSIS — E039 Hypothyroidism, unspecified: Secondary | ICD-10-CM | POA: Diagnosis not present

## 2016-06-28 DIAGNOSIS — E785 Hyperlipidemia, unspecified: Secondary | ICD-10-CM | POA: Diagnosis not present

## 2016-06-28 DIAGNOSIS — E119 Type 2 diabetes mellitus without complications: Secondary | ICD-10-CM | POA: Diagnosis not present

## 2016-06-28 DIAGNOSIS — E559 Vitamin D deficiency, unspecified: Secondary | ICD-10-CM | POA: Diagnosis not present

## 2016-07-02 DIAGNOSIS — E039 Hypothyroidism, unspecified: Secondary | ICD-10-CM | POA: Diagnosis not present

## 2016-07-02 DIAGNOSIS — E559 Vitamin D deficiency, unspecified: Secondary | ICD-10-CM | POA: Diagnosis not present

## 2016-07-02 DIAGNOSIS — E785 Hyperlipidemia, unspecified: Secondary | ICD-10-CM | POA: Diagnosis not present

## 2016-07-02 DIAGNOSIS — E119 Type 2 diabetes mellitus without complications: Secondary | ICD-10-CM | POA: Diagnosis not present

## 2016-07-02 DIAGNOSIS — N390 Urinary tract infection, site not specified: Secondary | ICD-10-CM | POA: Diagnosis not present

## 2016-07-02 DIAGNOSIS — I10 Essential (primary) hypertension: Secondary | ICD-10-CM | POA: Diagnosis not present

## 2016-07-03 ENCOUNTER — Ambulatory Visit (INDEPENDENT_AMBULATORY_CARE_PROVIDER_SITE_OTHER): Payer: Medicare Other | Admitting: Family

## 2016-07-03 ENCOUNTER — Encounter: Payer: Self-pay | Admitting: Family

## 2016-07-03 VITALS — BP 148/59 | HR 61 | Temp 97.0°F | Ht 64.0 in | Wt 212.0 lb

## 2016-07-03 DIAGNOSIS — R110 Nausea: Secondary | ICD-10-CM

## 2016-07-03 DIAGNOSIS — E871 Hypo-osmolality and hyponatremia: Secondary | ICD-10-CM

## 2016-07-03 DIAGNOSIS — F411 Generalized anxiety disorder: Secondary | ICD-10-CM | POA: Diagnosis not present

## 2016-07-03 DIAGNOSIS — E876 Hypokalemia: Secondary | ICD-10-CM

## 2016-07-03 DIAGNOSIS — N390 Urinary tract infection, site not specified: Secondary | ICD-10-CM | POA: Diagnosis not present

## 2016-07-03 DIAGNOSIS — Z09 Encounter for follow-up examination after completed treatment for conditions other than malignant neoplasm: Secondary | ICD-10-CM

## 2016-07-03 DIAGNOSIS — I10 Essential (primary) hypertension: Secondary | ICD-10-CM

## 2016-07-03 DIAGNOSIS — B372 Candidiasis of skin and nail: Secondary | ICD-10-CM

## 2016-07-03 LAB — BMP8+EGFR
BUN / CREAT RATIO: 20 (ref 12–28)
BUN: 16 mg/dL (ref 8–27)
CALCIUM: 9 mg/dL (ref 8.7–10.3)
CHLORIDE: 82 mmol/L — AB (ref 96–106)
CO2: 26 mmol/L (ref 18–29)
Creatinine, Ser: 0.82 mg/dL (ref 0.57–1.00)
GFR calc non Af Amer: 65 mL/min/{1.73_m2} (ref >59)
GFR, EST AFRICAN AMERICAN: 75 mL/min/{1.73_m2} (ref >59)
GLUCOSE: 89 mg/dL (ref 65–99)
POTASSIUM: 3.8 mmol/L (ref 3.5–5.2)
Sodium: 122 mmol/L — ABNORMAL LOW (ref 134–144)

## 2016-07-03 MED ORDER — METOPROLOL SUCCINATE ER 50 MG PO TB24: 50.0000 mg | ORAL_TABLET | Freq: Every day | ORAL | 0 refills | Status: DC

## 2016-07-03 MED ORDER — LOSARTAN POTASSIUM 100 MG PO TABS: 100.0000 mg | ORAL_TABLET | Freq: Every day | ORAL | 0 refills | Status: DC

## 2016-07-03 MED ORDER — DILTIAZEM HCL ER 180 MG PO CP24: 180.0000 mg | ORAL_CAPSULE | Freq: Two times a day (BID) | ORAL | 0 refills | Status: DC

## 2016-07-03 MED ORDER — CLONIDINE HCL 0.1 MG PO TABS: 0.1000 mg | ORAL_TABLET | Freq: Every day | ORAL | 0 refills | Status: DC

## 2016-07-03 MED ORDER — NYSTATIN 100000 UNIT/GM EX POWD: Freq: Four times a day (QID) | CUTANEOUS | 0 refills | Status: DC

## 2016-07-03 MED ORDER — LEVOTHYROXINE SODIUM 112 MCG PO TABS: 112.0000 ug | ORAL_TABLET | Freq: Every day | ORAL | 1 refills | Status: DC

## 2016-07-03 MED ORDER — CITALOPRAM HYDROBROMIDE 20 MG PO TABS: 20.0000 mg | ORAL_TABLET | Freq: Every day | ORAL | 0 refills | Status: DC

## 2016-07-03 NOTE — Patient Instructions (Signed)
Hyponatremia °Hyponatremia is when the amount of salt (sodium) in your blood is too low. When sodium levels are low, your cells absorb extra water and they swell. The swelling happens throughout the body, but it mostly affects the brain. °CAUSES °This condition may be caused by: °· Heart, kidney, or liver problems. °· Thyroid problems. °· Adrenal gland problems. °· Metabolic conditions, such as syndrome of inappropriate antidiuretic hormone (SIADH). °· Severe vomiting and diarrhea. °· Certain medicines or illegal drugs. °· Dehydration. °· Drinking too much water. °· Eating a diet that is low in sodium. °· Large burns on your body. °· Sweating. °RISK FACTORS °This condition is more likely to develop in people who: °· Have long-term (chronic) kidney disease. °· Have heart failure. °· Have a medical condition that causes frequent or excessive diarrhea. °· Have metabolic conditions, such as Addison disease or SIADH. °· Take certain medicines that affect the sodium and fluid balance in the blood. Some of these medicine types include: °¨ Diuretics. °¨ NSAIDs. °¨ Some opioid pain medicines. °¨ Some antidepressants. °¨ Some seizure prevention medicines. °SYMPTOMS  °Symptoms of this condition include: °· Nausea and vomiting. °· Confusion. °· Lethargy. °· Agitation. °· Headache. °· Seizures. °· Unconsciousness. °· Appetite loss. °· Muscle weakness and cramping. °· Feeling weak or light-headed. °· Having a rapid heart rate. °· Fainting, in severe cases. °DIAGNOSIS °This condition is diagnosed with a medical history and physical exam. You will also have other tests, including: °· Blood tests. °· Urine tests. °TREATMENT °Treatment for this condition depends on the cause. Treatment may include: °· Fluids given through an IV tube that is inserted into one of your veins. °· Medicines to correct the sodium imbalance. If medicines are causing the condition, the medicines will need to be adjusted. °· Limiting water or fluid intake to  get the correct sodium balance. °HOME CARE INSTRUCTIONS °· Take medicines only as directed by your health care provider. Many medicines can make this condition worse. Talk with your health care provider about any medicines that you are currently taking. °· Carefully follow a recommended diet as directed by your health care provider. °· Carefully follow instructions from your health care provider about fluid restrictions. °· Keep all follow-up visits as directed by your health care provider. This is important. °· Do not drink alcohol. °SEEK MEDICAL CARE IF: °· You develop worsening nausea, fatigue, headache, confusion, or weakness. °· Your symptoms go away and then return. °· You have problems following the recommended diet. °SEEK IMMEDIATE MEDICAL CARE IF: °· You have a seizure. °· You faint. °· You have ongoing diarrhea or vomiting. °  °This information is not intended to replace advice given to you by your health care provider. Make sure you discuss any questions you have with your health care provider. °  °Document Released: 11/16/2002 Document Revised: 04/12/2015 Document Reviewed: 12/16/2014 °Elsevier Interactive Patient Education ©2016 [employer] ° ° ° °

## 2016-07-03 NOTE — Progress Notes (Signed)
Subjective:    Patient ID: Katherine Tyler, female    DOB: 10/23/1929, 82 y.o.   MRN: [id number]  HPI Pt presents to the office today for a Transitional Care Management today. Pt was admitted to the hospital on 06/18/16 and discharged on 06/21/16 for Hyponatremia, UTI, hypokalemia, and N&V. [name] chlorthalidone 25 mg was stopped related to hyponatremia. Pt was given cipro antibiotic which has been completed. PT denies UTI symptoms at this time. PT reports drinking about 14 bottles of water and Gatorade in 24 hour period to "stay hydrated".  PT is getting physical therapy twice a week.   *PT was contacted by RN on 06/25/16 by telephone.  Review of Systems  Constitutional: Positive for fatigue.  HENT: Negative.   Eyes: Negative.   Respiratory: Negative.  Negative for shortness of breath.   Cardiovascular: Negative.  Negative for palpitations.  Gastrointestinal: Negative.   Endocrine: Negative.   Genitourinary: Negative.   Musculoskeletal: Negative.   Neurological: Negative.  Negative for headaches.  Hematological: Negative.   Psychiatric/Behavioral: Negative.   All other systems reviewed and are negative.      Objective:   Physical Exam  Constitutional: She is oriented to person, place, and time. She appears well-developed and well-nourished. No distress.  HENT:  Head: Normocephalic and atraumatic.  Eyes: Pupils are equal, round, and reactive to light.  Neck: Normal range of motion. Neck supple. No thyromegaly present.  Cardiovascular: Normal rate, regular rhythm, normal heart sounds and intact distal pulses.   No murmur heard. Pulmonary/Chest: Effort normal and breath sounds normal. No respiratory distress. She has no wheezes.  Abdominal: Soft. Bowel sounds are normal. She exhibits no distension. There is no tenderness.  Musculoskeletal: Normal range of motion. She exhibits edema (trace in bilateral ankles). She exhibits no tenderness.  Pt using walker  Neurological: She is alert and  oriented to person, place, and time. She has normal reflexes. No cranial nerve deficit.  Skin: Skin is warm and dry. Rash noted.  Generalized erythemas rash under bilateral breast  Psychiatric: She has a normal mood and affect. Her behavior is normal. Judgment and thought content normal.  Vitals reviewed.    BP (!) 148/59   Pulse 61   Temp 97 F (36.1 C) (Oral)   Ht 5' 4"  (1.626 m)   Wt 212 lb (96.2 kg)   BMI 36.39 kg/m       Assessment & Plan:  1. Essential hypertension - citalopram (CELEXA) 20 MG tablet; Take 1 tablet (20 mg total) by mouth daily.  Dispense: 90 tablet; Refill: 0 - cloNIDine (CATAPRES) 0.1 MG tablet; Take 1 tablet (0.1 mg total) by mouth daily.  Dispense: 90 tablet; Refill: 0 - diltiazem (DILACOR XR) 180 MG 24 hr capsule; Take 1 capsule (180 mg total) by mouth 2 (two) times daily.  Dispense: 180 capsule; Refill: 0 - losartan (COZAAR) 100 MG tablet; Take 1 tablet (100 mg total) by mouth daily. Changed by Fran Lowes , MD (kidney doctor)  Dispense: 90 tablet; Refill: 0 - metoprolol succinate (TOPROL-XL) 50 MG 24 hr tablet; Take 1 tablet (50 mg total) by mouth daily. Take with or immediately following a meal.  Dispense: 90 tablet; Refill: 0 - BMP8+EGFR  2. GAD (generalized anxiety disorder) - citalopram (CELEXA) 20 MG tablet; Take 1 tablet (20 mg total) by mouth daily.  Dispense: 90 tablet; Refill: 0 - BMP8+EGFR  3. Hyponatremia - BMP8+EGFR  4. Hypokalemia - BMP8+EGFR  5. Nausea without vomiting - BMP8+EGFR  6. UTI (lower  urinary tract infection) - BMP8+EGFR  7. Skin yeast infection - nystatin (MYCOSTATIN/NYSTOP) powder; Apply topically 4 (four) times daily.  Dispense: 15 g; Refill: 0 - BMP8+EGFR  8. Hospital discharge follow-up   Continue all meds Labs pending Health Maintenance reviewed Diet and exercise encouraged RTO 3 months  Evelina Dun, FNP

## 2016-07-04 DIAGNOSIS — I10 Essential (primary) hypertension: Secondary | ICD-10-CM | POA: Diagnosis not present

## 2016-07-04 DIAGNOSIS — E039 Hypothyroidism, unspecified: Secondary | ICD-10-CM | POA: Diagnosis not present

## 2016-07-04 DIAGNOSIS — E119 Type 2 diabetes mellitus without complications: Secondary | ICD-10-CM | POA: Diagnosis not present

## 2016-07-04 DIAGNOSIS — E785 Hyperlipidemia, unspecified: Secondary | ICD-10-CM | POA: Diagnosis not present

## 2016-07-04 DIAGNOSIS — N390 Urinary tract infection, site not specified: Secondary | ICD-10-CM | POA: Diagnosis not present

## 2016-07-04 DIAGNOSIS — E559 Vitamin D deficiency, unspecified: Secondary | ICD-10-CM | POA: Diagnosis not present

## 2016-07-06 DIAGNOSIS — E559 Vitamin D deficiency, unspecified: Secondary | ICD-10-CM | POA: Diagnosis not present

## 2016-07-06 DIAGNOSIS — E039 Hypothyroidism, unspecified: Secondary | ICD-10-CM | POA: Diagnosis not present

## 2016-07-06 DIAGNOSIS — I10 Essential (primary) hypertension: Secondary | ICD-10-CM | POA: Diagnosis not present

## 2016-07-06 DIAGNOSIS — E785 Hyperlipidemia, unspecified: Secondary | ICD-10-CM | POA: Diagnosis not present

## 2016-07-06 DIAGNOSIS — N390 Urinary tract infection, site not specified: Secondary | ICD-10-CM | POA: Diagnosis not present

## 2016-07-06 DIAGNOSIS — E119 Type 2 diabetes mellitus without complications: Secondary | ICD-10-CM | POA: Diagnosis not present

## 2016-07-10 DIAGNOSIS — E785 Hyperlipidemia, unspecified: Secondary | ICD-10-CM | POA: Diagnosis not present

## 2016-07-10 DIAGNOSIS — E039 Hypothyroidism, unspecified: Secondary | ICD-10-CM | POA: Diagnosis not present

## 2016-07-10 DIAGNOSIS — I10 Essential (primary) hypertension: Secondary | ICD-10-CM | POA: Diagnosis not present

## 2016-07-10 DIAGNOSIS — N390 Urinary tract infection, site not specified: Secondary | ICD-10-CM | POA: Diagnosis not present

## 2016-07-10 DIAGNOSIS — E119 Type 2 diabetes mellitus without complications: Secondary | ICD-10-CM | POA: Diagnosis not present

## 2016-07-10 DIAGNOSIS — E559 Vitamin D deficiency, unspecified: Secondary | ICD-10-CM | POA: Diagnosis not present

## 2016-07-12 DIAGNOSIS — I10 Essential (primary) hypertension: Secondary | ICD-10-CM | POA: Diagnosis not present

## 2016-07-12 DIAGNOSIS — E785 Hyperlipidemia, unspecified: Secondary | ICD-10-CM | POA: Diagnosis not present

## 2016-07-12 DIAGNOSIS — E559 Vitamin D deficiency, unspecified: Secondary | ICD-10-CM | POA: Diagnosis not present

## 2016-07-12 DIAGNOSIS — E039 Hypothyroidism, unspecified: Secondary | ICD-10-CM | POA: Diagnosis not present

## 2016-07-12 DIAGNOSIS — N390 Urinary tract infection, site not specified: Secondary | ICD-10-CM | POA: Diagnosis not present

## 2016-07-12 DIAGNOSIS — E119 Type 2 diabetes mellitus without complications: Secondary | ICD-10-CM | POA: Diagnosis not present

## 2016-07-17 DIAGNOSIS — E785 Hyperlipidemia, unspecified: Secondary | ICD-10-CM | POA: Diagnosis not present

## 2016-07-17 DIAGNOSIS — E559 Vitamin D deficiency, unspecified: Secondary | ICD-10-CM | POA: Diagnosis not present

## 2016-07-17 DIAGNOSIS — E039 Hypothyroidism, unspecified: Secondary | ICD-10-CM | POA: Diagnosis not present

## 2016-07-17 DIAGNOSIS — N390 Urinary tract infection, site not specified: Secondary | ICD-10-CM | POA: Diagnosis not present

## 2016-07-17 DIAGNOSIS — I10 Essential (primary) hypertension: Secondary | ICD-10-CM | POA: Diagnosis not present

## 2016-07-17 DIAGNOSIS — E119 Type 2 diabetes mellitus without complications: Secondary | ICD-10-CM | POA: Diagnosis not present

## 2016-07-19 DIAGNOSIS — I10 Essential (primary) hypertension: Secondary | ICD-10-CM | POA: Diagnosis not present

## 2016-07-19 DIAGNOSIS — N390 Urinary tract infection, site not specified: Secondary | ICD-10-CM | POA: Diagnosis not present

## 2016-07-19 DIAGNOSIS — E559 Vitamin D deficiency, unspecified: Secondary | ICD-10-CM | POA: Diagnosis not present

## 2016-07-19 DIAGNOSIS — E039 Hypothyroidism, unspecified: Secondary | ICD-10-CM | POA: Diagnosis not present

## 2016-07-19 DIAGNOSIS — E785 Hyperlipidemia, unspecified: Secondary | ICD-10-CM | POA: Diagnosis not present

## 2016-07-19 DIAGNOSIS — E119 Type 2 diabetes mellitus without complications: Secondary | ICD-10-CM | POA: Diagnosis not present

## 2016-07-23 DIAGNOSIS — E119 Type 2 diabetes mellitus without complications: Secondary | ICD-10-CM | POA: Diagnosis not present

## 2016-07-23 DIAGNOSIS — E785 Hyperlipidemia, unspecified: Secondary | ICD-10-CM | POA: Diagnosis not present

## 2016-07-23 DIAGNOSIS — E559 Vitamin D deficiency, unspecified: Secondary | ICD-10-CM | POA: Diagnosis not present

## 2016-07-23 DIAGNOSIS — N390 Urinary tract infection, site not specified: Secondary | ICD-10-CM | POA: Diagnosis not present

## 2016-07-23 DIAGNOSIS — E039 Hypothyroidism, unspecified: Secondary | ICD-10-CM | POA: Diagnosis not present

## 2016-07-23 DIAGNOSIS — I10 Essential (primary) hypertension: Secondary | ICD-10-CM | POA: Diagnosis not present

## 2016-07-25 DIAGNOSIS — E039 Hypothyroidism, unspecified: Secondary | ICD-10-CM | POA: Diagnosis not present

## 2016-07-25 DIAGNOSIS — N390 Urinary tract infection, site not specified: Secondary | ICD-10-CM | POA: Diagnosis not present

## 2016-07-25 DIAGNOSIS — E785 Hyperlipidemia, unspecified: Secondary | ICD-10-CM | POA: Diagnosis not present

## 2016-07-25 DIAGNOSIS — E119 Type 2 diabetes mellitus without complications: Secondary | ICD-10-CM | POA: Diagnosis not present

## 2016-07-25 DIAGNOSIS — I10 Essential (primary) hypertension: Secondary | ICD-10-CM | POA: Diagnosis not present

## 2016-07-25 DIAGNOSIS — E559 Vitamin D deficiency, unspecified: Secondary | ICD-10-CM | POA: Diagnosis not present

## 2016-08-09 ENCOUNTER — Ambulatory Visit (INDEPENDENT_AMBULATORY_CARE_PROVIDER_SITE_OTHER): Payer: Medicare Other | Admitting: Family Medicine

## 2016-08-09 DIAGNOSIS — E119 Type 2 diabetes mellitus without complications: Secondary | ICD-10-CM | POA: Diagnosis not present

## 2016-08-09 DIAGNOSIS — E785 Hyperlipidemia, unspecified: Secondary | ICD-10-CM

## 2016-08-09 DIAGNOSIS — N390 Urinary tract infection, site not specified: Secondary | ICD-10-CM

## 2016-08-09 DIAGNOSIS — I10 Essential (primary) hypertension: Secondary | ICD-10-CM | POA: Diagnosis not present

## 2016-09-24 ENCOUNTER — Ambulatory Visit (INDEPENDENT_AMBULATORY_CARE_PROVIDER_SITE_OTHER): Payer: Medicare Other | Admitting: Family

## 2016-09-24 ENCOUNTER — Encounter: Payer: Self-pay | Admitting: Family

## 2016-09-24 VITALS — BP 150/72 | HR 62 | Temp 97.2°F | Ht 64.0 in | Wt 217.2 lb

## 2016-09-24 DIAGNOSIS — E039 Hypothyroidism, unspecified: Secondary | ICD-10-CM | POA: Diagnosis not present

## 2016-09-24 DIAGNOSIS — J301 Allergic rhinitis due to pollen: Secondary | ICD-10-CM

## 2016-09-24 DIAGNOSIS — I10 Essential (primary) hypertension: Secondary | ICD-10-CM | POA: Diagnosis not present

## 2016-09-24 DIAGNOSIS — E669 Obesity, unspecified: Secondary | ICD-10-CM | POA: Diagnosis not present

## 2016-09-24 DIAGNOSIS — E8881 Metabolic syndrome: Secondary | ICD-10-CM | POA: Diagnosis not present

## 2016-09-24 DIAGNOSIS — E559 Vitamin D deficiency, unspecified: Secondary | ICD-10-CM

## 2016-09-24 DIAGNOSIS — Z23 Encounter for immunization: Secondary | ICD-10-CM | POA: Diagnosis not present

## 2016-09-24 DIAGNOSIS — M19011 Primary osteoarthritis, right shoulder: Secondary | ICD-10-CM

## 2016-09-24 DIAGNOSIS — F411 Generalized anxiety disorder: Secondary | ICD-10-CM

## 2016-09-24 DIAGNOSIS — M1990 Unspecified osteoarthritis, unspecified site: Secondary | ICD-10-CM | POA: Insufficient documentation

## 2016-09-24 MED ORDER — CLONIDINE HCL 0.1 MG PO TABS: 0.1000 mg | ORAL_TABLET | Freq: Every day | ORAL | 0 refills | Status: DC

## 2016-09-24 MED ORDER — LEVOTHYROXINE SODIUM 112 MCG PO TABS: 112.0000 ug | ORAL_TABLET | Freq: Every day | ORAL | 1 refills | Status: DC

## 2016-09-24 MED ORDER — METHYLPREDNISOLONE ACETATE 40 MG/ML IJ SUSP
40.0000 mg | Freq: Once | INTRAMUSCULAR | Status: AC
  Administered 2016-09-24: 40 mg via INTRAMUSCULAR

## 2016-09-24 MED ORDER — BUPIVACAINE HCL 0.25 % IJ SOLN
1.0000 mL | Freq: Once | INTRAMUSCULAR | Status: AC
  Administered 2016-09-24: 1 mL via INTRA_ARTICULAR

## 2016-09-24 MED ORDER — METOPROLOL SUCCINATE ER 50 MG PO TB24: 50.0000 mg | ORAL_TABLET | Freq: Every day | ORAL | 0 refills | Status: DC

## 2016-09-24 MED ORDER — DILTIAZEM HCL ER 180 MG PO CP24: 180.0000 mg | ORAL_CAPSULE | Freq: Two times a day (BID) | ORAL | 0 refills | Status: DC

## 2016-09-24 MED ORDER — LOSARTAN POTASSIUM 100 MG PO TABS: 100.0000 mg | ORAL_TABLET | Freq: Every day | ORAL | 0 refills | Status: DC

## 2016-09-24 MED ORDER — CITALOPRAM HYDROBROMIDE 20 MG PO TABS: 20.0000 mg | ORAL_TABLET | Freq: Every day | ORAL | 0 refills | Status: DC

## 2016-09-24 NOTE — Progress Notes (Signed)
Subjective:    Patient ID: Katherine Tyler, female    DOB: Dec 26, 1928, 81 y.o.   MRN: [id number]  Pt presents to the office today for chronic follow up. PT is followed by nephrologists every 6 months for uncontrolled HTN. [name] BP is still not at goal today.  Hypertension  This is a chronic problem. The current episode started more than 1 year ago. The problem has been waxing and waning since onset. The problem is uncontrolled. Associated symptoms include anxiety and malaise/fatigue. Pertinent negatives include no headaches, palpitations, peripheral edema, shortness of breath or sweats. Risk factors for coronary artery disease include family history, obesity, post-menopausal state and sedentary lifestyle. Past treatments include angiotensin blockers, diuretics, direct vasodilators, calcium channel blockers and beta blockers. The current treatment provides mild improvement. Hypertensive end-organ damage includes kidney disease and a thyroid problem. There is no history of CAD/MI, CVA or heart failure.  Medication Refill  Pertinent negatives include no headaches, numbness or visual change.  Thyroid Problem  Presents for follow-up visit. Symptoms include anxiety. Patient reports no depressed mood, diarrhea, heat intolerance, menstrual problem, palpitations or visual change. The symptoms have been stable. Past treatments include levothyroxine. The treatment provided significant relief. There is no history of heart failure.  Anxiety  Presents for follow-up visit. Onset was more than 5 years ago. The problem has been waxing and waning. Symptoms include excessive worry and nervous/anxious behavior. Patient reports no confusion, depressed mood, insomnia, palpitations or shortness of breath. Symptoms occur rarely. The severity of symptoms is mild. The symptoms are aggravated by family issues.   Her past medical history is significant for anxiety/panic attacks. There is no history of CAD or depression. Past treatments  include SSRIs. The treatment provided significant relief. Compliance with prior treatments has been good.  Shoulder Pain   The pain is present in the right shoulder. This is a recurrent problem. The current episode started more than 1 month ago. There has been a history of trauma (fall 10-15 years ago). The problem occurs intermittently. The problem has been waxing and waning. The quality of the pain is described as aching. The pain is at a severity of 8/10. The pain is moderate. Associated symptoms include a limited range of motion and stiffness. Pertinent negatives include no itching, joint swelling, numbness or tingling. The symptoms are aggravated by heat. She has tried NSAIDS for the symptoms. The treatment provided mild relief. Her past medical history is significant for osteoarthritis.  Metabolic Syndrome PT states she eats "what she wants" and denies any low fat or low carb diet. [name] LDL was 93 and fasting glucose was 89.     Review of Systems  Constitutional: Positive for malaise/fatigue.  HENT: Negative.   Eyes: Negative.   Respiratory: Negative.  Negative for shortness of breath.   Cardiovascular: Negative.  Negative for palpitations.  Gastrointestinal: Negative.  Negative for diarrhea.  Endocrine: Negative.  Negative for heat intolerance.  Genitourinary: Negative.  Negative for menstrual problem.  Musculoskeletal: Positive for stiffness.  Skin: Negative for itching.  Neurological: Negative.  Negative for tingling, numbness and headaches.  Hematological: Negative.   Psychiatric/Behavioral: Negative for confusion. The patient is nervous/anxious. The patient does not have insomnia.   All other systems reviewed and are negative.      Objective:   Physical Exam  Constitutional: She is oriented to person, place, and time. She appears well-developed and well-nourished. No distress.  HENT:  Head: Normocephalic and atraumatic.  Right Ear: External ear normal.  Left  Ear: External  ear normal.  Nose: Nose normal.  Mouth/Throat: Oropharynx is clear and moist.  Eyes: Pupils are equal, round, and reactive to light.  Neck: Normal range of motion. Neck supple. No thyromegaly present.  Cardiovascular: Normal rate, regular rhythm, normal heart sounds and intact distal pulses.   No murmur heard. Pulmonary/Chest: Effort normal and breath sounds normal. No respiratory distress. She has no wheezes.  Abdominal: Soft. Bowel sounds are normal. She exhibits no distension. There is no tenderness.  Musculoskeletal: She exhibits no edema or tenderness.  Limited ROM of right shoulder with abduction related to pain   Neurological: She is alert and oriented to person, place, and time. She has normal reflexes. No cranial nerve deficit.  Skin: Skin is warm and dry.  Psychiatric: She has a normal mood and affect. Her behavior is normal. Judgment and thought content normal.  Vitals reviewed.   rightshoulder prepped with betadine Injected with Marcaine .5% plain and methylprednisolone with 22 guage needle x 1. Patient tolerated well.   BP (!) 150/72   Pulse 62   Temp 97.2 F (36.2 C) (Oral)   Ht _0  (1.626 m)   Wt 217 lb 3.2 oz (98.5 kg)   BMI 37.28 kg/m      Assessment & Plan:  1. Encounter for immunization - Flu vaccine HIGH DOSE PF - methylPREDNISolone acetate (DEPO-MEDROL) injection 40 mg; Inject 1 mL (40 mg total) into the muscle once. - bupivacaine (MARCAINE) 0.25 % (with pres) injection 1 mL; Inject 1 mL into the articular space once. - CMP14+EGFR - Thyroid Panel With TSH  2. Essential hypertension - CMP14+EGFR - citalopram (CELEXA) 20 MG tablet; Take 1 tablet (20 mg total) by mouth daily.  Dispense: 90 tablet; Refill: 0 - cloNIDine (CATAPRES) 0.1 MG tablet; Take 1 tablet (0.1 mg total) by mouth daily.  Dispense: 90 tablet; Refill: 0 - diltiazem (DILACOR XR) 180 MG 24 hr capsule; Take 1 capsule (180 mg total) by mouth 2 (two) times daily.  Dispense: 180 capsule;  Refill: 0 - losartan (COZAAR) 100 MG tablet; Take 1 tablet (100 mg total) by mouth daily. Changed by Fran Lowes , MD (kidney doctor)  Dispense: 90 tablet; Refill: 0 - metoprolol succinate (TOPROL-XL) 50 MG 24 hr tablet; Take 1 tablet (50 mg total) by mouth daily. Take with or immediately following a meal.  Dispense: 90 tablet; Refill: 0  3. Seasonal allergic rhinitis due to pollen, unspecified chronicity - CMP14+EGFR  4. Hypothyroidism, unspecified type - CMP14+EGFR - Thyroid Panel With TSH - levothyroxine (SYNTHROID) 112 MCG tablet; Take 1 tablet (112 mcg total) by mouth daily before breakfast.  Dispense: 90 tablet; Refill: 1  5. GAD (generalized anxiety disorder) - CMP14+EGFR - citalopram (CELEXA) 20 MG tablet; Take 1 tablet (20 mg total) by mouth daily.  Dispense: 90 tablet; Refill: 0  6. Metabolic syndrome - PPI95+JOAC  7. Obesity (BMI 30-39.9) - CMP14+EGFR  8. Vitamin D deficiency - CMP14+EGFR  9. Primary osteoarthritis of right shoulder - methylPREDNISolone acetate (DEPO-MEDROL) injection 40 mg; Inject 1 mL (40 mg total) into the muscle once. - bupivacaine (MARCAINE) 0.25 % (with pres) injection 1 mL; Inject 1 mL into the articular space once. - CMP14+EGFR   Continue all meds Labs pending Health Maintenance reviewed Diet and exercise encouraged RTO 4  months  Evelina Dun, FNP

## 2016-09-24 NOTE — Patient Instructions (Signed)

## 2016-09-25 LAB — CMP14+EGFR
ALBUMIN: 4.3 g/dL (ref 3.5–4.7)
ALT: 16 IU/L (ref 0–32)
AST: 27 IU/L (ref 0–40)
Albumin/Globulin Ratio: 1.4 (ref 1.2–2.2)
Alkaline Phosphatase: 90 IU/L (ref 39–117)
BILIRUBIN TOTAL: 0.3 mg/dL (ref 0.0–1.2)
BUN / CREAT RATIO: 24 (ref 12–28)
BUN: 23 mg/dL (ref 8–27)
CO2: 19 mmol/L (ref 18–29)
CREATININE: 0.97 mg/dL (ref 0.57–1.00)
Calcium: 10 mg/dL (ref 8.7–10.3)
Chloride: 91 mmol/L — ABNORMAL LOW (ref 96–106)
GFR calc non Af Amer: 53 mL/min/{1.73_m2} — ABNORMAL LOW (ref >59)
GFR, EST AFRICAN AMERICAN: 62 mL/min/{1.73_m2} (ref >59)
GLUCOSE: 85 mg/dL (ref 65–99)
Globulin, Total: 3.1 g/dL (ref 1.5–4.5)
Potassium: 5 mmol/L (ref 3.5–5.2)
Sodium: 133 mmol/L — ABNORMAL LOW (ref 134–144)
TOTAL PROTEIN: 7.4 g/dL (ref 6.0–8.5)

## 2016-09-25 LAB — THYROID PANEL WITH TSH
FREE THYROXINE INDEX: 4 (ref 1.2–4.9)
T3 Uptake Ratio: 40 % — ABNORMAL HIGH (ref 24–39)
T4, Total: 10 ug/dL (ref 4.5–12.0)
TSH: 2.04 u[IU]/mL (ref 0.450–4.500)

## 2016-11-22 ENCOUNTER — Other Ambulatory Visit: Payer: Self-pay | Admitting: *Deleted

## 2016-11-22 DIAGNOSIS — I10 Essential (primary) hypertension: Secondary | ICD-10-CM

## 2016-11-22 MED ORDER — DILTIAZEM HCL ER COATED BEADS 180 MG PO CP24: 180.0000 mg | ORAL_CAPSULE | Freq: Two times a day (BID) | ORAL | 0 refills | Status: DC

## 2017-01-01 ENCOUNTER — Other Ambulatory Visit: Payer: Self-pay | Admitting: Family

## 2017-01-01 DIAGNOSIS — I10 Essential (primary) hypertension: Secondary | ICD-10-CM

## 2017-01-01 DIAGNOSIS — F411 Generalized anxiety disorder: Secondary | ICD-10-CM

## 2017-01-02 ENCOUNTER — Other Ambulatory Visit: Payer: Self-pay

## 2017-01-02 DIAGNOSIS — F411 Generalized anxiety disorder: Secondary | ICD-10-CM

## 2017-01-02 DIAGNOSIS — I10 Essential (primary) hypertension: Secondary | ICD-10-CM

## 2017-01-02 MED ORDER — CITALOPRAM HYDROBROMIDE 20 MG PO TABS: 20.0000 mg | ORAL_TABLET | Freq: Every day | ORAL | 0 refills | Status: DC

## 2017-01-28 ENCOUNTER — Ambulatory Visit (INDEPENDENT_AMBULATORY_CARE_PROVIDER_SITE_OTHER): Payer: Medicare Other | Admitting: Family

## 2017-01-28 ENCOUNTER — Encounter: Payer: Self-pay | Admitting: Family

## 2017-01-28 VITALS — BP 181/64 | HR 58 | Temp 97.5°F | Ht 64.0 in | Wt 223.2 lb

## 2017-01-28 DIAGNOSIS — E669 Obesity, unspecified: Secondary | ICD-10-CM

## 2017-01-28 DIAGNOSIS — E559 Vitamin D deficiency, unspecified: Secondary | ICD-10-CM | POA: Diagnosis not present

## 2017-01-28 DIAGNOSIS — E8881 Metabolic syndrome: Secondary | ICD-10-CM

## 2017-01-28 DIAGNOSIS — F411 Generalized anxiety disorder: Secondary | ICD-10-CM | POA: Diagnosis not present

## 2017-01-28 DIAGNOSIS — J301 Allergic rhinitis due to pollen: Secondary | ICD-10-CM

## 2017-01-28 DIAGNOSIS — I10 Essential (primary) hypertension: Secondary | ICD-10-CM | POA: Diagnosis not present

## 2017-01-28 DIAGNOSIS — E039 Hypothyroidism, unspecified: Secondary | ICD-10-CM

## 2017-01-28 DIAGNOSIS — M19011 Primary osteoarthritis, right shoulder: Secondary | ICD-10-CM

## 2017-01-28 MED ORDER — CLONIDINE HCL 0.1 MG PO TABS: 0.1000 mg | ORAL_TABLET | Freq: Every day | ORAL | 1 refills | Status: DC

## 2017-01-28 MED ORDER — METOPROLOL SUCCINATE ER 50 MG PO TB24: 50.0000 mg | ORAL_TABLET | Freq: Every day | ORAL | 1 refills | Status: DC

## 2017-01-28 MED ORDER — CITALOPRAM HYDROBROMIDE 20 MG PO TABS: 20.0000 mg | ORAL_TABLET | Freq: Every day | ORAL | 1 refills | Status: DC

## 2017-01-28 MED ORDER — LEVOTHYROXINE SODIUM 112 MCG PO TABS: 112.0000 ug | ORAL_TABLET | Freq: Every day | ORAL | 1 refills | Status: DC

## 2017-01-28 MED ORDER — DILTIAZEM HCL ER COATED BEADS 180 MG PO CP24: 180.0000 mg | ORAL_CAPSULE | Freq: Two times a day (BID) | ORAL | 1 refills | Status: DC

## 2017-01-28 MED ORDER — LOSARTAN POTASSIUM 100 MG PO TABS: 100.0000 mg | ORAL_TABLET | Freq: Every day | ORAL | 1 refills | Status: DC

## 2017-01-28 NOTE — Patient Instructions (Signed)
Osteoarthritis  Osteoarthritis is a type of arthritis that affects tissue that covers the ends of bones in joints (cartilage). Cartilage acts as a cushion between the bones and helps them move smoothly. Osteoarthritis results when cartilage in the joints gets worn down. Osteoarthritis is sometimes called "wear and tear" arthritis.  Osteoarthritis is the most common form of arthritis. It often occurs in older people. It is a condition that gets worse over time (a progressive condition). Joints that are most often affected by this condition are in:  · Fingers.  · Toes.  · Hips.  · Knees.  · Spine, including neck and lower back.    What are the causes?  This condition is caused by age-related wearing down of cartilage that covers the ends of bones.  What increases the risk?  The following factors may make you more likely to develop this condition:  · Older age.  · Being overweight or obese.  · Overuse of joints, such as in athletes.  · Past injury of a joint.  · Past surgery on a joint.  · Family history of osteoarthritis.    What are the signs or symptoms?  The main symptoms of this condition are pain, swelling, and stiffness in the joint. The joint may lose its shape over time. Small pieces of bone or cartilage may break off and float inside of the joint, which may cause more pain and damage to the joint. Small deposits of bone (osteophytes) may grow on the edges of the joint. Other symptoms may include:  · A grating or scraping feeling inside the joint when you move it.  · Popping or creaking sounds when you move.    Symptoms may affect one or more joints. Osteoarthritis in a major joint, such as your knee or hip, can make it painful to walk or exercise. If you have osteoarthritis in your hands, you might not be able to grip items, twist your hand, or control small movements of your hands and fingers (fine motor skills).  How is this diagnosed?  This condition may be diagnosed based on:  · Your medical history.  · A  physical exam.  · Your symptoms.  · X-rays of the affected joint(s).  · Blood tests to rule out other types of arthritis.    How is this treated?  There is no cure for this condition, but treatment can help to control pain and improve joint function. Treatment plans may include:  · A prescribed exercise program that allows for rest and joint relief. You may work with a physical therapist.  · A weight control plan.  · Pain relief techniques, such as:  ? Applying heat and cold to the joint.  ? Electric pulses delivered to nerve endings under the skin (transcutaneous electrical nerve stimulation, or TENS).  ? Massage.  ? Certain nutritional supplements.  · NSAIDs or prescription medicines to help relieve pain.  · Medicine to help relieve pain and inflammation (corticosteroids). This can be given by mouth (orally) or as an injection.  · Assistive devices, such as a brace, wrap, splint, specialized glove, or cane.  · Surgery, such as:  ? An osteotomy. This is done to reposition the bones and relieve pain or to remove loose pieces of bone and cartilage.  ? Joint replacement surgery. You may need this surgery if you have very bad (advanced) osteoarthritis.    Follow these instructions at home:  Activity   · Rest your affected joints as directed by your   health care provider.  · Do not drive or use heavy machinery while taking prescription pain medicine.  · Exercise as directed. Your health care provider or physical therapist may recommend specific types of exercise, such as:  ? Strengthening exercises. These are done to strengthen the muscles that support joints that are affected by arthritis. They can be performed with weights or with exercise bands to add resistance.  ? Aerobic activities. These are exercises, such as brisk walking or water aerobics, that get your heart pumping.  ? Range-of-motion activities. These keep your joints easy to move.  ? Balance and agility exercises.  Managing pain, stiffness, and swelling    · If directed, apply heat to the affected area as often as told by your health care provider. Use the heat source that your health care provider recommends, such as a moist heat pack or a heating pad.  ? If you have a removable assistive device, remove it as told by your health care provider.  ? Place a towel between your skin and the heat source. If your health care provider tells you to keep the assistive device on while you apply heat, place a towel between the assistive device and the heat source.  ? Leave the heat on for 20-30 minutes.  ? Remove the heat if your skin turns bright red. This is especially important if you are unable to feel pain, heat, or cold. You may have a greater risk of getting burned.  · If directed, put ice on the affected joint:  ? If you have a removable assistive device, remove it as told by your health care provider.  ? Put ice in a plastic bag.  ? Place a towel between your skin and the bag. If your health care provider tells you to keep the assistive device on during icing, place a towel between the assistive device and the bag.  ? Leave the ice on for 20 minutes, 2-3 times a day.  General instructions   · Take over-the-counter and prescription medicines only as told by your health care provider.  · Maintain a healthy weight. Follow instructions from your health care provider for weight control. These may include dietary restrictions.  · Do not use any products that contain nicotine or tobacco, such as cigarettes and e-cigarettes. These can delay bone healing. If you need help quitting, ask your health care provider.  · Use assistive devices as directed by your health care provider.  · Keep all follow-up visits as told by your health care provider. This is important.  Where to find more information:  · National Institute of Arthritis and Musculoskeletal and Skin Diseases: www.niams.nih.gov  · National Institute on Aging: www.nia.nih.gov  · American College of Rheumatology:  www.rheumatology.org  Contact a health care provider if:  · Your skin turns red.  · You develop a rash.  · You have pain that gets worse.  · You have a fever along with joint or muscle aches.  Get help right away if:  · You lose a lot of weight.  · You suddenly lose your appetite.  · You have night sweats.  Summary  · Osteoarthritis is a type of arthritis that affects tissue covering the ends of bones in joints (cartilage).  · This condition is caused by age-related wearing down of cartilage that covers the ends of bones.  · The main symptom of this condition is pain, swelling, and stiffness in the joint.  · There is no cure for this   condition, but treatment can help to control pain and improve joint function.  This information is not intended to replace advice given to you by your health care provider. Make sure you discuss any questions you have with your health care provider.  Document Released: 11/26/2005 Document Revised: 07/30/2016 Document Reviewed: 07/30/2016  Elsevier Interactive Patient Education © 2017 [employer]

## 2017-01-28 NOTE — Progress Notes (Signed)
Subjective:    Patient ID: Katherine Tyler, female    DOB: [birth year], 83 y.o.   MRN: [id number]  Pt presents to the office today for chronic follow up. PT is followed by nephrologists every 6 months for uncontrolled HTN. [name] BP is still not at goal today.  Hypertension  This is a chronic problem. The current episode started more than 1 year ago. The problem has been waxing and waning since onset. The problem is uncontrolled. Associated symptoms include anxiety and malaise/fatigue. Pertinent negatives include no palpitations, peripheral edema, shortness of breath or sweats. Risk factors for coronary artery disease include family history, obesity, post-menopausal state and sedentary lifestyle. Past treatments include angiotensin blockers, diuretics, direct vasodilators, calcium channel blockers and beta blockers. The current treatment provides mild improvement. Hypertensive end-organ damage includes kidney disease. There is no history of CAD/MI, CVA or heart failure. Identifiable causes of hypertension include a thyroid problem.  Thyroid Problem  Presents for follow-up visit. Symptoms include anxiety. Patient reports no depressed mood, diarrhea, dry skin, heat intolerance, menstrual problem or palpitations. The symptoms have been stable. Past treatments include levothyroxine. The treatment provided significant relief. There is no history of heart failure.  Anxiety  Presents for follow-up visit. Onset was more than 5 years ago. The problem has been waxing and waning. Symptoms include excessive worry and nervous/anxious behavior. Patient reports no confusion, depressed mood, insomnia, palpitations or shortness of breath. Symptoms occur rarely. The severity of symptoms is mild. The symptoms are aggravated by family issues.   Her past medical history is significant for anxiety/panic attacks. There is no history of CAD or depression. Past treatments include SSRIs. The treatment provided significant relief.  Compliance with prior treatments has been good.  Shoulder Pain   The pain is present in the right shoulder. This is a recurrent problem. The current episode started more than 1 month ago. There has been a history of trauma (fall 10-15 years ago). The problem occurs intermittently. The problem has been waxing and waning. The quality of the pain is described as aching. The pain is at a severity of 3/10. The pain is mild. Associated symptoms include a limited range of motion and stiffness. Pertinent negatives include no itching, joint swelling or tingling. The symptoms are aggravated by heat. She has tried NSAIDS for the symptoms. The treatment provided mild relief. Her past medical history is significant for osteoarthritis.  Metabolic Syndrome PT states she eats "what she wants" and denies any low fat or low carb diet. [name] LDL was 93 and fasting glucose was 89.     Review of Systems  Constitutional: Positive for malaise/fatigue.  HENT: Negative.   Eyes: Negative.   Respiratory: Negative.  Negative for shortness of breath.   Cardiovascular: Negative.  Negative for palpitations.  Gastrointestinal: Negative.  Negative for diarrhea.  Endocrine: Negative.  Negative for heat intolerance.  Genitourinary: Negative.  Negative for menstrual problem.  Musculoskeletal: Positive for stiffness.  Skin: Negative for itching.  Neurological: Negative.  Negative for tingling.  Hematological: Negative.   Psychiatric/Behavioral: Negative for confusion. The patient is nervous/anxious. The patient does not have insomnia.   All other systems reviewed and are negative.      Objective:   Physical Exam  Constitutional: She is oriented to person, place, and time. She appears well-developed and well-nourished. No distress.  HENT:  Head: Normocephalic and atraumatic.  Right Ear: External ear normal.  Left Ear: External ear normal.  Nose: Nose normal.  Mouth/Throat: Oropharynx is clear and  moist.  Eyes: Pupils  are equal, round, and reactive to light.  Neck: Normal range of motion. Neck supple. No thyromegaly present.  Cardiovascular: Normal rate, regular rhythm, normal heart sounds and intact distal pulses.   No murmur heard. Pulmonary/Chest: Effort normal and breath sounds normal. No respiratory distress. She has no wheezes.  Abdominal: Soft. Bowel sounds are normal. She exhibits no distension. There is no tenderness.  Musculoskeletal: She exhibits no edema or tenderness.  Limited ROM of right shoulder with abduction related to pain   Neurological: She is alert and oriented to person, place, and time. She has normal reflexes. No cranial nerve deficit.  Skin: Skin is warm and dry.  Psychiatric: She has a normal mood and affect. Her behavior is normal. Judgment and thought content normal.  Vitals reviewed.    BP (!) 157/65   Pulse 61   Temp 97.5 F (36.4 C) (Oral)   Ht 5' 4"  (1.626 m)   Wt 223 lb 3.2 oz (101.2 kg)   BMI 38.31 kg/m      Assessment & Plan:  1. Hypothyroidism, unspecified type - levothyroxine (SYNTHROID) 112 MCG tablet; Take 1 tablet (112 mcg total) by mouth daily before breakfast.  Dispense: 90 tablet; Refill: 1 - CMP14+EGFR -Thyroid panel   2. Essential hypertension - metoprolol succinate (TOPROL-XL) 50 MG 24 hr tablet; Take 1 tablet (50 mg total) by mouth daily. Take with or immediately following a meal.  Dispense: 90 tablet; Refill: 1 - losartan (COZAAR) 100 MG tablet; Take 1 tablet (100 mg total) by mouth daily. Changed by Fran Lowes , MD (kidney doctor)  Dispense: 90 tablet; Refill: 1 - cloNIDine (CATAPRES) 0.1 MG tablet; Take 1 tablet (0.1 mg total) by mouth daily.  Dispense: 90 tablet; Refill: 1 - diltiazem (CARDIZEM CD) 180 MG 24 hr capsule; Take 1 capsule (180 mg total) by mouth 2 (two) times daily.  Dispense: 180 capsule; Refill: 1 - CMP14+EGFR  3. GAD (generalized anxiety disorder) - citalopram (CELEXA) 20 MG tablet; Take 1 tablet (20 mg total) by  mouth daily.  Dispense: 90 tablet; Refill: 1 - CMP14+EGFR  4. Seasonal allergic rhinitis due to pollen, unspecified chronicity - CMP14+EGFR  5. Primary osteoarthritis of right shoulder - HEK35+CYEL  6. Metabolic syndrome - YHT09+PJPE  7. Obesity (BMI 30-39.9) - CMP14+EGFR  8. Vitamin D deficiency - CMP14+EGFR   Continue all meds Labs pending Health Maintenance reviewed Diet and exercise encouraged RTO 4 months  Evelina Dun, FNP

## 2017-01-29 LAB — CMP14+EGFR
ALBUMIN: 4.2 g/dL (ref 3.5–4.7)
ALK PHOS: 80 IU/L (ref 39–117)
ALT: 19 IU/L (ref 0–32)
AST: 23 IU/L (ref 0–40)
Albumin/Globulin Ratio: 1.4 (ref 1.2–2.2)
BILIRUBIN TOTAL: 0.4 mg/dL (ref 0.0–1.2)
BUN / CREAT RATIO: 21 (ref 12–28)
BUN: 20 mg/dL (ref 8–27)
CHLORIDE: 92 mmol/L — AB (ref 96–106)
CO2: 25 mmol/L (ref 18–29)
Calcium: 9.7 mg/dL (ref 8.7–10.3)
Creatinine, Ser: 0.94 mg/dL (ref 0.57–1.00)
GFR calc Af Amer: 64 mL/min/{1.73_m2} (ref >59)
GFR calc non Af Amer: 55 mL/min/{1.73_m2} — ABNORMAL LOW (ref >59)
GLUCOSE: 88 mg/dL (ref 65–99)
Globulin, Total: 3 g/dL (ref 1.5–4.5)
Potassium: 4.8 mmol/L (ref 3.5–5.2)
Sodium: 133 mmol/L — ABNORMAL LOW (ref 134–144)
Total Protein: 7.2 g/dL (ref 6.0–8.5)

## 2017-01-29 LAB — THYROID PANEL WITH TSH
FREE THYROXINE INDEX: 5.1 — AB (ref 1.2–4.9)
T3 Uptake Ratio: 44 % — ABNORMAL HIGH (ref 24–39)
T4, Total: 11.6 ug/dL (ref 4.5–12.0)
TSH: 0.925 u[IU]/mL (ref 0.450–4.500)

## 2017-03-21 ENCOUNTER — Ambulatory Visit (INDEPENDENT_AMBULATORY_CARE_PROVIDER_SITE_OTHER): Payer: Medicare Other | Admitting: *Deleted

## 2017-03-21 VITALS — BP 185/73 | HR 50 | Temp 97.2°F | Ht 64.0 in | Wt 223.0 lb

## 2017-03-21 DIAGNOSIS — Z0000 Encounter for general adult medical examination without abnormal findings: Secondary | ICD-10-CM

## 2017-03-21 NOTE — Patient Instructions (Signed)
  Katherine Tyler , Thank you for taking time to come for your Medicare Wellness Visit. I appreciate your ongoing commitment to your health goals. Please review the following plan we discussed and let me know if I can assist you in the future.   These are the goals we discussed: Goals    . Exercise 3x per week (30 min per time)       This is a list of the screening recommended for you and due dates:  Health Maintenance  Topic Date Due  . DEXA scan (bone density measurement)  05/02/2017  . Flu Shot  07/10/2017  . Tetanus Vaccine  01/15/2026  . Pneumonia vaccines  Completed   We will arrange to do a chest xray and a bone density scan when you see Neysa Bonito next time. Keep follow up appointments  Review the advance directives and have a notary sign these. Bring [place] a COPY Schedule a EYE exam

## 2017-03-21 NOTE — Progress Notes (Signed)
Subjective:   Katherine Tyler is a 83 y.o. female who presents for an Initial Medicare Annual Wellness Visit.  Patient here today for medicare annual wellness visit. She is a 83 year old female who lives at home by herself and enjoys cooking for her family. She cooks three semi-healthy meals a day. She grew up and married into [employer]. She like watching TV and attending parties. She walks laps in her kitchen for exercise. She is aware of fall hazards and risk in her home. She has 6 children that are all living. She does not have any pets, as she is afraid they will make her fall. She does not attend church. She states that her health today is the same as it was a year ago.        Objective:    Today's Vitals   03/21/17 1525 03/21/17 1624  BP: (!) 192/75 (!) 185/73  Pulse: (!) 51 (!) 50  Temp: 97.2 F (36.2 C)   TempSrc: Oral   Weight: 223 lb (101.2 kg)   Height:  (1.626 m)    Body mass index is 38.28 kg/m.   Current Medications (verified) Outpatient Encounter Prescriptions as of 03/21/2017  Medication Sig  . aspirin 81 MG tablet Take 81 mg by mouth daily.  . Cholecalciferol (D3-1000) 1000 UNITS capsule Take 1,000 Units by mouth daily.   . citalopram (CELEXA) 20 MG tablet Take 1 tablet (20 mg total) by mouth daily.  . cloNIDine (CATAPRES) 0.1 MG tablet Take 1 tablet (0.1 mg total) by mouth daily.  Marland Kitchen diltiazem (CARDIZEM CD) 180 MG 24 hr capsule Take 1 capsule (180 mg total) by mouth 2 (two) times daily.  . Ferrous Sulfate Dried (SLOW RELEASE IRON) 45 MG TBCR Take 45 mg by mouth 2 (two) times daily.   Marland Kitchen levothyroxine (SYNTHROID) 112 MCG tablet Take 1 tablet (112 mcg total) by mouth daily before breakfast.  . losartan (COZAAR) 100 MG tablet Take 1 tablet (100 mg total) by mouth daily. Changed by Salomon Mast , MD (kidney doctor)  . magnesium oxide (MAG-OX) 400 (241.3 MG) MG tablet Take 1 tablet (400 mg total) by mouth daily.  . metoprolol succinate (TOPROL-XL) 50 MG 24  hr tablet Take 1 tablet (50 mg total) by mouth daily. Take with or immediately following a meal.  . nystatin (MYCOSTATIN/NYSTOP) powder Apply topically 4 (four) times daily.  Marland Kitchen thiamine (VITAMIN B-1) 100 MG tablet Take 100 mg by mouth daily.   No facility-administered encounter medications on file as of 03/21/2017.     Allergies (verified) Terazosin hcl and Keflex [cephalexin]   History: Past Medical History:  Diagnosis Date  . Anxiety   . Arthritis   . GERD (gastroesophageal reflux disease)   . Hiatal hernia   . Hyperlipidemia   . Hypertension   . Obesity   . Thyroid disease   . Vitamin D deficiency    Past Surgical History:  Procedure Laterality Date  . CHOLECYSTECTOMY    . TUBAL LIGATION  1960   Family History  Problem Relation Age of Onset  . Heart disease Mother   . Stroke Father   . Heart disease Sister   . Heart disease Brother   . Heart disease Sister   . Heart disease Sister   . Heart disease Brother   . Heart disease Brother   . Diabetes Daughter    Social History   Occupational History  . Not on file.   Social History Main Topics  .  Smoking status: Never Smoker  . Smokeless tobacco: Former Neurosurgeon    Types: Snuff    Quit date: 03/21/1957  . Alcohol use No  . Drug use: No  . Sexual activity: No    Tobacco Counseling Counseling given: Not Answered she used Snuff in the past = but has never smoked  Activities of Daily Living In your present state of health, do you have any difficulty performing the following activities: 03/21/2017 06/19/2016  Hearing? N N  Vision? Y N  Difficulty concentrating or making decisions? N N  Walking or climbing stairs? Y Y  Dressing or bathing? N N  Doing errands, shopping? Y Y  Some recent data might be hidden   She and her daughter state that her hearing is very good Pt wears rx glasses - overdue for eye exam - she will schedule this. She can not go up and down stairs - due to walker. She does not drive, but gets  around ok to do shopping when someone can take her.   Immunizations and Health Maintenance Immunization History  Administered Date(s) Administered  . Influenza, High Dose Seasonal PF 09/24/2016  . Influenza,inj,Quad PF,36+ Mos 08/31/2013, 09/12/2015  . Pneumococcal Conjugate-13 05/03/2015  . Pneumococcal Polysaccharide-23 12/10/1996  . Tdap 01/16/2016  . Zoster 01/16/2016   There are no preventive care reminders to display for this patient.  Patient Care Team: Junie Spencer, FNP as PCP - General (Nurse Practitioner)  Indicate any recent Medical Services you may have received from other than [facility] providers in the past year (date may be approximate).     Assessment:   This is a routine wellness examination for [facility].   Hearing/Vision screen No exam data present She will schedule an eye exam.  Dietary issues and exercise activities discussed:    Goals    . Exercise 3x per week (30 min per time)     we discussed her having the children and grandchildren  Depression Screen PHQ 2/9 Scores 03/21/2017 01/28/2017 09/24/2016 07/03/2016 05/17/2016 01/16/2016 08/10/2015  PHQ - 2 Score 0 0 0 0 0 0 0    Fall Risk Fall Risk  03/21/2017 01/28/2017 09/24/2016 07/03/2016 05/17/2016  Falls in the past year? No Yes No Yes Yes  Number falls in past yr: - 2 or more - 1 1  Injury with Fall? - No - No No  Risk Factor Category  - - - - -  Risk for fall due to : - - - - -    Cognitive Function: MMSE - Mini Mental State Exam 03/21/2017  Orientation to time 5  Orientation to Place 5  Registration 3  Attention/ Calculation 2  Recall 3  Language- name 2 objects 2  Language- repeat 1  Language- follow 3 step command 3  Language- read & follow direction 1  Write a sentence 0  Copy design 1  Total score 26    scored 26 / 30 today.    Screening Tests Health Maintenance  Topic Date Due  . DEXA SCAN  05/02/2017  . INFLUENZA VACCINE  07/10/2017  . TETANUS/TDAP  01/15/2026  . PNA vac Low Risk  Adult  Completed      Plan:     During the course of the visit, Katherine Tyler was educated and counseled about the following appropriate screening and preventive services:   Vaccines to include Pneumoccal, Influenza, Hepatitis B, Td, Zostavax, HCV  Electrocardiogram  Cardiovascular disease screening  Colorectal cancer screening  Bone density screening  Diabetes  screening  Glaucoma screening  Mammography/PAP  Nutrition counseling  Smoking cessation counseling  Keep follow up appointments with Christy = at the next OV we will do a DEXA and a CXR. She will schedule an Eye Exam at [facility] here in [place]  She was encouraged to exercise a little with the help of her children and grandchildren.  Patient Instructions (the written plan) were given to the patient.    Brynna Dobos, Almond Lint, LPN   1/61/0960    I have reviewed and agree with the above AWV documentation.   Jannifer Rodney, FNP

## 2017-05-30 ENCOUNTER — Encounter: Payer: Self-pay | Admitting: Family

## 2017-05-30 ENCOUNTER — Ambulatory Visit (INDEPENDENT_AMBULATORY_CARE_PROVIDER_SITE_OTHER): Payer: Medicare Other | Admitting: Family

## 2017-05-30 VITALS — BP 173/68 | HR 61 | Temp 97.4°F | Ht 64.0 in | Wt 213.4 lb

## 2017-05-30 DIAGNOSIS — E8881 Metabolic syndrome: Secondary | ICD-10-CM

## 2017-05-30 DIAGNOSIS — E669 Obesity, unspecified: Secondary | ICD-10-CM | POA: Diagnosis not present

## 2017-05-30 DIAGNOSIS — F411 Generalized anxiety disorder: Secondary | ICD-10-CM | POA: Diagnosis not present

## 2017-05-30 DIAGNOSIS — E039 Hypothyroidism, unspecified: Secondary | ICD-10-CM

## 2017-05-30 DIAGNOSIS — I10 Essential (primary) hypertension: Secondary | ICD-10-CM | POA: Diagnosis not present

## 2017-05-30 DIAGNOSIS — M19011 Primary osteoarthritis, right shoulder: Secondary | ICD-10-CM | POA: Diagnosis not present

## 2017-05-30 DIAGNOSIS — E871 Hypo-osmolality and hyponatremia: Secondary | ICD-10-CM

## 2017-05-30 NOTE — Progress Notes (Signed)
   Subjective:    Patient ID: Katherine Tyler, female    DOB: December 07, 1929, 83 y.o.   MRN: [id number]  Pt presents to the office today for chronic follow up.  Hypertension  This is a chronic problem. The current episode started more than 1 year ago. The problem has been waxing and waning since onset. The problem is uncontrolled. Associated symptoms include anxiety and malaise/fatigue. Pertinent negatives include no peripheral edema or shortness of breath. The current treatment provides moderate improvement. There is no history of kidney disease, CAD/MI or heart failure. Identifiable causes of hypertension include a thyroid problem.  Anxiety  Presents for follow-up visit. Symptoms include depressed mood, excessive worry, irritability and nervous/anxious behavior. Patient reports no shortness of breath. Symptoms occur occasionally.    Thyroid Problem  Presents for follow-up visit. Symptoms include anxiety, depressed mood, fatigue and tremors. The symptoms have been stable. There is no history of heart failure.  Arthritis  Presents for follow-up visit. She complains of pain and joint swelling. Affected locations include the left shoulder and right shoulder. Her pain is at a severity of 8/10. Associated symptoms include fatigue.  Metabolic Syndrome PT states she "eats what I want, just not a lot".     Review of Systems  Constitutional: Positive for fatigue, irritability and malaise/fatigue.  Respiratory: Negative for shortness of breath.   Musculoskeletal: Positive for arthritis and joint swelling.  Neurological: Positive for tremors.  Psychiatric/Behavioral: The patient is nervous/anxious.   All other systems reviewed and are negative.      Objective:   Physical Exam  Constitutional: She is oriented to person, place, and time. She appears well-developed and well-nourished. No distress.  Obese   HENT:  Head: Normocephalic and atraumatic.  Right Ear: External ear normal.  Left Ear: External  ear normal.  Nose: Nose normal.  Mouth/Throat: Oropharynx is clear and moist.  Eyes: Pupils are equal, round, and reactive to light.  Neck: Normal range of motion. Neck supple. No thyromegaly present.  Cardiovascular: Normal rate, regular rhythm, normal heart sounds and intact distal pulses.   No murmur heard. Pulmonary/Chest: Effort normal and breath sounds normal. No respiratory distress. She has no wheezes.  Abdominal: Soft. Bowel sounds are normal. She exhibits no distension. There is no tenderness.  Musculoskeletal: Normal range of motion. She exhibits no edema or tenderness.  Neurological: She is alert and oriented to person, place, and time.  Skin: Skin is warm and dry.  Psychiatric: She has a normal mood and affect. Her behavior is normal. Judgment and thought content normal.  Vitals reviewed.     BP (!) 173/68   Pulse 61   Temp 97.4 F (36.3 C) (Oral)   Ht '5\' 4"'$  (1.626 m)   Wt 213 lb 6.4 oz (96.8 kg)   BMI 36.63 kg/m      Assessment & Plan:  1. Essential hypertension - CMP14+EGFR  2. Hypothyroidism, unspecified type - CMP14+EGFR - Thyroid Panel With TSH  3. Primary osteoarthritis of right shoulder - CMP14+EGFR  4. Obesity (BMI 30-39.9) - CMP14+EGFR  5. GAD (generalized anxiety disorder)  - MPN36+RWER  6. Metabolic syndrome - XVQ00+QQPY  7. Hyponatremia - CMP14+EGFR   Continue all meds Labs pending Health Maintenance reviewed Diet and exercise encouraged RTO 4 months   Evelina Dun, FNP

## 2017-05-30 NOTE — Patient Instructions (Signed)

## 2017-05-31 LAB — CMP14+EGFR
A/G RATIO: 1.4 (ref 1.2–2.2)
ALK PHOS: 73 IU/L (ref 39–117)
ALT: 15 IU/L (ref 0–32)
AST: 23 IU/L (ref 0–40)
Albumin: 4.1 g/dL (ref 3.5–4.7)
BUN/Creatinine Ratio: 15 (ref 12–28)
BUN: 17 mg/dL (ref 8–27)
Bilirubin Total: 0.5 mg/dL (ref 0.0–1.2)
CALCIUM: 9.3 mg/dL (ref 8.7–10.3)
CHLORIDE: 87 mmol/L — AB (ref 96–106)
CO2: 24 mmol/L (ref 20–29)
Creatinine, Ser: 1.12 mg/dL — ABNORMAL HIGH (ref 0.57–1.00)
GFR calc Af Amer: 51 mL/min/{1.73_m2} — ABNORMAL LOW (ref >59)
GFR, EST NON AFRICAN AMERICAN: 45 mL/min/{1.73_m2} — AB (ref >59)
Globulin, Total: 2.9 g/dL (ref 1.5–4.5)
Glucose: 100 mg/dL — ABNORMAL HIGH (ref 65–99)
POTASSIUM: 4.1 mmol/L (ref 3.5–5.2)
SODIUM: 125 mmol/L — AB (ref 134–144)
Total Protein: 7 g/dL (ref 6.0–8.5)

## 2017-05-31 LAB — THYROID PANEL WITH TSH
Free Thyroxine Index: 5 — ABNORMAL HIGH (ref 1.2–4.9)
T3 Uptake Ratio: 47 % — ABNORMAL HIGH (ref 24–39)
T4, Total: 10.7 ug/dL (ref 4.5–12.0)
TSH: 3.05 u[IU]/mL (ref 0.450–4.500)

## 2017-07-15 ENCOUNTER — Other Ambulatory Visit: Payer: Self-pay | Admitting: Family

## 2017-07-15 DIAGNOSIS — F411 Generalized anxiety disorder: Secondary | ICD-10-CM

## 2017-07-31 ENCOUNTER — Other Ambulatory Visit: Payer: Self-pay | Admitting: Family

## 2017-07-31 DIAGNOSIS — I10 Essential (primary) hypertension: Secondary | ICD-10-CM

## 2017-08-06 ENCOUNTER — Other Ambulatory Visit: Payer: Self-pay | Admitting: Family

## 2017-08-06 DIAGNOSIS — F411 Generalized anxiety disorder: Secondary | ICD-10-CM

## 2017-10-01 ENCOUNTER — Encounter: Payer: Self-pay | Admitting: Family

## 2017-10-01 ENCOUNTER — Ambulatory Visit (INDEPENDENT_AMBULATORY_CARE_PROVIDER_SITE_OTHER): Payer: Medicare Other | Admitting: Family

## 2017-10-01 VITALS — BP 156/66 | HR 53 | Temp 97.3°F | Ht 64.0 in | Wt 225.6 lb

## 2017-10-01 DIAGNOSIS — I10 Essential (primary) hypertension: Secondary | ICD-10-CM | POA: Diagnosis not present

## 2017-10-01 DIAGNOSIS — D509 Iron deficiency anemia, unspecified: Secondary | ICD-10-CM | POA: Diagnosis not present

## 2017-10-01 DIAGNOSIS — F411 Generalized anxiety disorder: Secondary | ICD-10-CM | POA: Diagnosis not present

## 2017-10-01 DIAGNOSIS — Z23 Encounter for immunization: Secondary | ICD-10-CM

## 2017-10-01 DIAGNOSIS — E039 Hypothyroidism, unspecified: Secondary | ICD-10-CM | POA: Diagnosis not present

## 2017-10-01 DIAGNOSIS — M19011 Primary osteoarthritis, right shoulder: Secondary | ICD-10-CM

## 2017-10-01 DIAGNOSIS — R6889 Other general symptoms and signs: Secondary | ICD-10-CM | POA: Diagnosis not present

## 2017-10-01 DIAGNOSIS — E8881 Metabolic syndrome: Secondary | ICD-10-CM | POA: Diagnosis not present

## 2017-10-01 DIAGNOSIS — E559 Vitamin D deficiency, unspecified: Secondary | ICD-10-CM

## 2017-10-01 DIAGNOSIS — E669 Obesity, unspecified: Secondary | ICD-10-CM | POA: Diagnosis not present

## 2017-10-01 MED ORDER — DILTIAZEM HCL ER COATED BEADS 180 MG PO CP24: 180.0000 mg | ORAL_CAPSULE | Freq: Two times a day (BID) | ORAL | 1 refills | Status: DC

## 2017-10-01 MED ORDER — LOSARTAN POTASSIUM 100 MG PO TABS: 100.0000 mg | ORAL_TABLET | Freq: Every day | ORAL | 1 refills | Status: DC

## 2017-10-01 MED ORDER — NYSTATIN 100000 UNIT/GM EX POWD: Freq: Four times a day (QID) | CUTANEOUS | 0 refills | Status: AC

## 2017-10-01 MED ORDER — CLONIDINE HCL 0.1 MG PO TABS: 0.1000 mg | ORAL_TABLET | Freq: Every day | ORAL | 1 refills | Status: DC

## 2017-10-01 MED ORDER — LEVOTHYROXINE SODIUM 112 MCG PO TABS: 112.0000 ug | ORAL_TABLET | Freq: Every day | ORAL | 1 refills | Status: DC

## 2017-10-01 MED ORDER — CITALOPRAM HYDROBROMIDE 20 MG PO TABS: 20.0000 mg | ORAL_TABLET | Freq: Every day | ORAL | 0 refills | Status: DC

## 2017-10-01 NOTE — Progress Notes (Signed)
Subjective:    Patient ID: Katherine Tyler, female    DOB: [birth year], 83 y.o.   MRN: [id number]  Pt presents to the office today for chronic follow up. Hypertension  This is a chronic problem. The current episode started more than 1 year ago. The problem has been waxing and waning since onset. The problem is uncontrolled. Associated symptoms include anxiety. Pertinent negatives include no malaise/fatigue, peripheral edema or shortness of breath. Risk factors for coronary artery disease include dyslipidemia, obesity, sedentary lifestyle and family history. The current treatment provides mild improvement. There is no history of kidney disease, CAD/MI, CVA or heart failure. Identifiable causes of hypertension include a thyroid problem.  Thyroid Problem  Presents for follow-up visit. Symptoms include anxiety. Patient reports no depressed mood, dry skin, fatigue or nail problem. The symptoms have been stable. There is no history of heart failure.  Anxiety  Presents for follow-up visit. Symptoms include excessive worry, irritability and nervous/anxious behavior. Patient reports no depressed mood or shortness of breath. Symptoms occur most days. The quality of sleep is fair.   Her past medical history is significant for anemia.  Arthritis  Presents for follow-up visit. She complains of pain and joint swelling. The symptoms have been stable. Affected locations include the right knee and left knee. Her pain is at a severity of 7/10. Pertinent negatives include no fatigue or fever.  Anemia  Presents for follow-up visit. There has been no bruising/bleeding easily, fever, malaise/fatigue or pica. There is no history of heart failure.  Metabolic Syndrome  PT is not active and admits she eats what she wants.    Review of Systems  Constitutional: Positive for irritability. Negative for fatigue, fever and malaise/fatigue.  Respiratory: Negative for shortness of breath.   Musculoskeletal: Positive for arthritis  and joint swelling.  Hematological: Does not bruise/bleed easily.  Psychiatric/Behavioral: The patient is nervous/anxious.   All other systems reviewed and are negative.      Objective:   Physical Exam  Constitutional: She is oriented to person, place, and time. She appears well-developed and well-nourished. No distress.  Morbid obese  HENT:  Head: Normocephalic and atraumatic.  Right Ear: External ear normal.  Left Ear: External ear normal.  Nose: Nose normal.  Mouth/Throat: Oropharynx is clear and moist.  Eyes: Pupils are equal, round, and reactive to light.  Neck: Normal range of motion. Neck supple. No thyromegaly present.  Cardiovascular: Normal rate, regular rhythm, normal heart sounds and intact distal pulses.   No murmur heard. Pulmonary/Chest: Effort normal and breath sounds normal. No respiratory distress. She has no wheezes.  Abdominal: Soft. Bowel sounds are normal. She exhibits no distension. There is no tenderness.  Musculoskeletal: Normal range of motion. She exhibits no edema or tenderness.  Neurological: She is alert and oriented to person, place, and time.  Skin: Skin is warm and dry.  Psychiatric: She has a normal mood and affect. Her behavior is normal. Judgment and thought content normal.  Vitals reviewed.     BP (!) 172/56   Pulse 62   Temp (!) 97.3 F (36.3 C) (Oral)   Ht 5' 4"  (1.626 m)   Wt 225 lb 9.6 oz (102.3 kg)   BMI 38.72 kg/m      Assessment & Plan:  1. Hypothyroidism, unspecified type - CMP14+EGFR - TSH - levothyroxine (SYNTHROID) 112 MCG tablet; Take 1 tablet (112 mcg total) by mouth daily before breakfast.  Dispense: 90 tablet; Refill: 1  2. Essential hypertension - CMP14+EGFR - cloNIDine (  CATAPRES) 0.1 MG tablet; Take 1 tablet (0.1 mg total) by mouth daily.  Dispense: 90 tablet; Refill: 1 - diltiazem (CARDIZEM CD) 180 MG 24 hr capsule; Take 1 capsule (180 mg total) by mouth 2 (two) times daily.  Dispense: 180 capsule; Refill: 1 -  losartan (COZAAR) 100 MG tablet; Take 1 tablet (100 mg total) by mouth daily.  Dispense: 90 tablet; Refill: 1  3. GAD (generalized anxiety disorder) - CMP14+EGFR - citalopram (CELEXA) 20 MG tablet; Take 1 tablet (20 mg total) by mouth daily.  Dispense: 90 tablet; Refill: 0  4. Metabolic syndrome - VGC62+YOOJ  5. Obesity (BMI 30-39.9) - CMP14+EGFR  6. Primary osteoarthritis of right shoulder - CMP14+EGFR  7. Vitamin D deficiency - CMP14+EGFR   9. Iron deficiency anemia, unspecified iron deficiency anemia type - Anemia Profile B - CMP14+EGFR   Continue all meds Labs pending Health Maintenance reviewed Diet and exercise encouraged RTO 4 months   Evelina Dun, FNP

## 2017-10-01 NOTE — Patient Instructions (Signed)

## 2017-10-02 LAB — ANEMIA PROFILE B
Basophils Absolute: 0 10*3/uL (ref 0.0–0.2)
Basos: 0 %
EOS (ABSOLUTE): 0.1 10*3/uL (ref 0.0–0.4)
Eos: 2 %
FERRITIN: 175 ng/mL — AB (ref 15–150)
Hematocrit: 31.7 % — ABNORMAL LOW (ref 34.0–46.6)
Hemoglobin: 10.9 g/dL — ABNORMAL LOW (ref 11.1–15.9)
IMMATURE GRANS (ABS): 0 10*3/uL (ref 0.0–0.1)
IMMATURE GRANULOCYTES: 0 %
Iron Saturation: 16 % (ref 15–55)
Iron: 46 ug/dL (ref 27–139)
LYMPHS: 12 %
Lymphocytes Absolute: 0.7 10*3/uL (ref 0.7–3.1)
MCH: 34.4 pg — ABNORMAL HIGH (ref 26.6–33.0)
MCHC: 34.4 g/dL (ref 31.5–35.7)
MCV: 100 fL — ABNORMAL HIGH (ref 79–97)
Monocytes Absolute: 0.5 10*3/uL (ref 0.1–0.9)
Monocytes: 8 %
NEUTROS PCT: 78 %
Neutrophils Absolute: 4.8 10*3/uL (ref 1.4–7.0)
Platelets: 176 10*3/uL (ref 150–379)
RBC: 3.17 x10E6/uL — AB (ref 3.77–5.28)
RDW: 12.6 % (ref 12.3–15.4)
Retic Ct Pct: 2.4 % (ref 0.6–2.6)
Total Iron Binding Capacity: 279 ug/dL (ref 250–450)
UIBC: 233 ug/dL (ref 118–369)
Vitamin B-12: 330 pg/mL (ref 232–1245)
WBC: 6.2 10*3/uL (ref 3.4–10.8)

## 2017-10-02 LAB — CMP14+EGFR
ALT: 18 [IU]/L (ref 0–32)
AST: 22 [IU]/L (ref 0–40)
Albumin/Globulin Ratio: 1.4 (ref 1.2–2.2)
Albumin: 3.9 g/dL (ref 3.5–4.7)
Alkaline Phosphatase: 72 [IU]/L (ref 39–117)
BUN/Creatinine Ratio: 21 (ref 12–28)
BUN: 23 mg/dL (ref 8–27)
Bilirubin Total: 0.3 mg/dL (ref 0.0–1.2)
CO2: 26 mmol/L (ref 20–29)
Calcium: 9.7 mg/dL (ref 8.7–10.3)
Chloride: 92 mmol/L — ABNORMAL LOW (ref 96–106)
Creatinine, Ser: 1.1 mg/dL — ABNORMAL HIGH (ref 0.57–1.00)
GFR calc Af Amer: 53 mL/min/{1.73_m2} — ABNORMAL LOW
GFR calc non Af Amer: 46 mL/min/{1.73_m2} — ABNORMAL LOW
Globulin, Total: 2.8 g/dL (ref 1.5–4.5)
Glucose: 92 mg/dL (ref 65–99)
Potassium: 4.3 mmol/L (ref 3.5–5.2)
Sodium: 131 mmol/L — ABNORMAL LOW (ref 134–144)
Total Protein: 6.7 g/dL (ref 6.0–8.5)

## 2017-10-02 LAB — TSH: TSH: 4.2 u[IU]/mL (ref 0.450–4.500)

## 2018-01-04 ENCOUNTER — Other Ambulatory Visit: Payer: Self-pay | Admitting: Family

## 2018-01-04 DIAGNOSIS — F411 Generalized anxiety disorder: Secondary | ICD-10-CM

## 2018-01-06 NOTE — Telephone Encounter (Signed)
Last seen 10/01/17  Katherine Tyler 

## 2018-01-14 ENCOUNTER — Inpatient Hospital Stay (HOSPITAL_COMMUNITY)
Admission: EM | Admit: 2018-01-14 | Discharge: 2018-01-18 | DRG: 292 | Disposition: A | Discharge status: Home / Self Care | Payer: Medicare Other | Attending: Internal Medicine | Admitting: Internal Medicine

## 2018-01-14 ENCOUNTER — Encounter (HOSPITAL_COMMUNITY): Payer: Self-pay | Admitting: Emergency Medicine

## 2018-01-14 ENCOUNTER — Ambulatory Visit (INDEPENDENT_AMBULATORY_CARE_PROVIDER_SITE_OTHER): Payer: Medicare Other | Admitting: Family Medicine

## 2018-01-14 ENCOUNTER — Other Ambulatory Visit: Payer: Self-pay

## 2018-01-14 ENCOUNTER — Emergency Department (HOSPITAL_COMMUNITY): Payer: Medicare Other

## 2018-01-14 ENCOUNTER — Encounter: Payer: Self-pay | Admitting: Family Medicine

## 2018-01-14 VITALS — BP 174/64 | HR 49 | Temp 98.5°F | Resp 33

## 2018-01-14 DIAGNOSIS — Z833 Family history of diabetes mellitus: Secondary | ICD-10-CM | POA: Diagnosis not present

## 2018-01-14 DIAGNOSIS — R531 Weakness: Secondary | ICD-10-CM | POA: Diagnosis not present

## 2018-01-14 DIAGNOSIS — E875 Hyperkalemia: Secondary | ICD-10-CM | POA: Diagnosis not present

## 2018-01-14 DIAGNOSIS — E039 Hypothyroidism, unspecified: Secondary | ICD-10-CM | POA: Diagnosis present

## 2018-01-14 DIAGNOSIS — R0603 Acute respiratory distress: Secondary | ICD-10-CM | POA: Diagnosis not present

## 2018-01-14 DIAGNOSIS — D649 Anemia, unspecified: Secondary | ICD-10-CM

## 2018-01-14 DIAGNOSIS — Z8249 Family history of ischemic heart disease and other diseases of the circulatory system: Secondary | ICD-10-CM | POA: Diagnosis not present

## 2018-01-14 DIAGNOSIS — R404 Transient alteration of awareness: Secondary | ICD-10-CM | POA: Diagnosis not present

## 2018-01-14 DIAGNOSIS — Z888 Allergy status to other drugs, medicaments and biological substances status: Secondary | ICD-10-CM

## 2018-01-14 DIAGNOSIS — Z823 Family history of stroke: Secondary | ICD-10-CM | POA: Diagnosis not present

## 2018-01-14 DIAGNOSIS — I361 Nonrheumatic tricuspid (valve) insufficiency: Secondary | ICD-10-CM | POA: Diagnosis not present

## 2018-01-14 DIAGNOSIS — I4891 Unspecified atrial fibrillation: Secondary | ICD-10-CM | POA: Diagnosis not present

## 2018-01-14 DIAGNOSIS — K219 Gastro-esophageal reflux disease without esophagitis: Secondary | ICD-10-CM | POA: Diagnosis present

## 2018-01-14 DIAGNOSIS — K449 Diaphragmatic hernia without obstruction or gangrene: Secondary | ICD-10-CM | POA: Diagnosis present

## 2018-01-14 DIAGNOSIS — E876 Hypokalemia: Secondary | ICD-10-CM | POA: Diagnosis not present

## 2018-01-14 DIAGNOSIS — E871 Hypo-osmolality and hyponatremia: Secondary | ICD-10-CM

## 2018-01-14 DIAGNOSIS — D509 Iron deficiency anemia, unspecified: Secondary | ICD-10-CM | POA: Diagnosis not present

## 2018-01-14 DIAGNOSIS — R0609 Other forms of dyspnea: Secondary | ICD-10-CM | POA: Diagnosis not present

## 2018-01-14 DIAGNOSIS — F411 Generalized anxiety disorder: Secondary | ICD-10-CM | POA: Diagnosis present

## 2018-01-14 DIAGNOSIS — I5033 Acute on chronic diastolic (congestive) heart failure: Secondary | ICD-10-CM | POA: Diagnosis present

## 2018-01-14 DIAGNOSIS — Z6841 Body Mass Index (BMI) 40.0 and over, adult: Secondary | ICD-10-CM

## 2018-01-14 DIAGNOSIS — Z79890 Hormone replacement therapy (postmenopausal): Secondary | ICD-10-CM

## 2018-01-14 DIAGNOSIS — T502X5A Adverse effect of carbonic-anhydrase inhibitors, benzothiadiazides and other diuretics, initial encounter: Secondary | ICD-10-CM | POA: Diagnosis not present

## 2018-01-14 DIAGNOSIS — I509 Heart failure, unspecified: Secondary | ICD-10-CM

## 2018-01-14 DIAGNOSIS — R0602 Shortness of breath: Secondary | ICD-10-CM | POA: Diagnosis not present

## 2018-01-14 DIAGNOSIS — I110 Hypertensive heart disease with heart failure: Principal | ICD-10-CM | POA: Diagnosis present

## 2018-01-14 DIAGNOSIS — F419 Anxiety disorder, unspecified: Secondary | ICD-10-CM | POA: Diagnosis present

## 2018-01-14 DIAGNOSIS — E785 Hyperlipidemia, unspecified: Secondary | ICD-10-CM | POA: Diagnosis present

## 2018-01-14 DIAGNOSIS — E669 Obesity, unspecified: Secondary | ICD-10-CM | POA: Diagnosis present

## 2018-01-14 DIAGNOSIS — R05 Cough: Secondary | ICD-10-CM | POA: Diagnosis not present

## 2018-01-14 DIAGNOSIS — Z9049 Acquired absence of other specified parts of digestive tract: Secondary | ICD-10-CM | POA: Diagnosis not present

## 2018-01-14 DIAGNOSIS — R0902 Hypoxemia: Secondary | ICD-10-CM | POA: Diagnosis not present

## 2018-01-14 DIAGNOSIS — Z7982 Long term (current) use of aspirin: Secondary | ICD-10-CM | POA: Diagnosis not present

## 2018-01-14 DIAGNOSIS — I10 Essential (primary) hypertension: Secondary | ICD-10-CM | POA: Diagnosis present

## 2018-01-14 DIAGNOSIS — M1990 Unspecified osteoarthritis, unspecified site: Secondary | ICD-10-CM | POA: Diagnosis present

## 2018-01-14 DIAGNOSIS — E559 Vitamin D deficiency, unspecified: Secondary | ICD-10-CM | POA: Diagnosis present

## 2018-01-14 DIAGNOSIS — Z881 Allergy status to other antibiotic agents status: Secondary | ICD-10-CM

## 2018-01-14 DIAGNOSIS — I340 Nonrheumatic mitral (valve) insufficiency: Secondary | ICD-10-CM | POA: Diagnosis not present

## 2018-01-14 DIAGNOSIS — I5031 Acute diastolic (congestive) heart failure: Secondary | ICD-10-CM | POA: Diagnosis not present

## 2018-01-14 DIAGNOSIS — R0989 Other specified symptoms and signs involving the circulatory and respiratory systems: Secondary | ICD-10-CM | POA: Diagnosis not present

## 2018-01-14 DIAGNOSIS — R0600 Dyspnea, unspecified: Secondary | ICD-10-CM

## 2018-01-14 HISTORY — DX: Hypothyroidism, unspecified: E03.9

## 2018-01-14 LAB — CBC WITH DIFFERENTIAL/PLATELET
Basophils Absolute: 0 10*3/uL (ref 0.0–0.1)
Basophils Relative: 0 %
Eosinophils Absolute: 0 10*3/uL (ref 0.0–0.7)
Eosinophils Relative: 0 %
HEMATOCRIT: 28.9 % — AB (ref 36.0–46.0)
HEMOGLOBIN: 9.6 g/dL — AB (ref 12.0–15.0)
LYMPHS ABS: 0.4 10*3/uL — AB (ref 0.7–4.0)
LYMPHS PCT: 5 %
MCH: 32.5 pg (ref 26.0–34.0)
MCHC: 33.2 g/dL (ref 30.0–36.0)
MCV: 98 fL (ref 78.0–100.0)
MONOS PCT: 9 %
Monocytes Absolute: 0.7 10*3/uL (ref 0.1–1.0)
NEUTROS ABS: 6.7 10*3/uL (ref 1.7–7.7)
NEUTROS PCT: 86 %
Platelets: 146 10*3/uL — ABNORMAL LOW (ref 150–400)
RBC: 2.95 MIL/uL — ABNORMAL LOW (ref 3.87–5.11)
RDW: 13.6 % (ref 11.5–15.5)
WBC: 7.8 10*3/uL (ref 4.0–10.5)

## 2018-01-14 LAB — BASIC METABOLIC PANEL
Anion gap: 11 (ref 5–15)
BUN: 29 mg/dL — AB (ref 6–20)
CHLORIDE: 85 mmol/L — AB (ref 101–111)
CO2: 25 mmol/L (ref 22–32)
CREATININE: 1.12 mg/dL — AB (ref 0.44–1.00)
Calcium: 9.6 mg/dL (ref 8.9–10.3)
GFR calc Af Amer: 50 mL/min — ABNORMAL LOW (ref >60)
GFR calc non Af Amer: 43 mL/min — ABNORMAL LOW (ref >60)
GLUCOSE: 123 mg/dL — AB (ref 65–99)
Potassium: 5.2 mmol/L — ABNORMAL HIGH (ref 3.5–5.1)
SODIUM: 121 mmol/L — AB (ref 135–145)

## 2018-01-14 LAB — URINALYSIS, ROUTINE W REFLEX MICROSCOPIC
BILIRUBIN URINE: NEGATIVE
Glucose, UA: NEGATIVE mg/dL
HGB URINE DIPSTICK: NEGATIVE
Ketones, ur: NEGATIVE mg/dL
NITRITE: NEGATIVE
Protein, ur: 100 mg/dL — AB
SPECIFIC GRAVITY, URINE: 1.006 (ref 1.005–1.030)
Squamous Epithelial / LPF: NONE SEEN
pH: 5 (ref 5.0–8.0)

## 2018-01-14 LAB — LACTIC ACID, PLASMA: Lactic Acid, Venous: 0.9 mmol/L (ref 0.5–1.9)

## 2018-01-14 LAB — INFLUENZA PANEL BY PCR (TYPE A & B)
Influenza A By PCR: NEGATIVE
Influenza B By PCR: NEGATIVE

## 2018-01-14 LAB — TROPONIN I
Troponin I: 0.03 ng/mL (ref <0.03)
Troponin I: 0.03 ng/mL (ref <0.03)

## 2018-01-14 LAB — BRAIN NATRIURETIC PEPTIDE: B NATRIURETIC PEPTIDE 5: 1398 pg/mL — AB (ref 0.0–100.0)

## 2018-01-14 MED ORDER — NITROGLYCERIN 2 % TD OINT
1.0000 [in_us] | TOPICAL_OINTMENT | Freq: Three times a day (TID) | TRANSDERMAL | Status: DC
  Administered 2018-01-14 – 2018-01-15 (×2): 1 [in_us] via TOPICAL
  Filled 2018-01-14 (×5): qty 1

## 2018-01-14 MED ORDER — ALBUTEROL SULFATE (2.5 MG/3ML) 0.083% IN NEBU
2.5000 mg | INHALATION_SOLUTION | Freq: Four times a day (QID) | RESPIRATORY_TRACT | Status: DC | PRN
Start: 2018-01-14 — End: 2018-01-18
  Administered 2018-01-16: 2.5 mg via RESPIRATORY_TRACT
  Filled 2018-01-14: qty 3

## 2018-01-14 MED ORDER — FUROSEMIDE 10 MG/ML IJ SOLN
40.0000 mg | Freq: Once | INTRAMUSCULAR | Status: AC
  Administered 2018-01-14: 40 mg via INTRAVENOUS
  Filled 2018-01-14: qty 4

## 2018-01-14 MED ORDER — FUROSEMIDE 10 MG/ML IJ SOLN: 20.0000 mg | Freq: Once | INTRAMUSCULAR | Status: DC

## 2018-01-14 MED ORDER — ALBUTEROL (5 MG/ML) CONTINUOUS INHALATION SOLN
10.0000 mg/h | INHALATION_SOLUTION | Freq: Once | RESPIRATORY_TRACT | Status: AC
  Administered 2018-01-14: 10 mg/h via RESPIRATORY_TRACT
  Filled 2018-01-14: qty 20

## 2018-01-14 MED ORDER — IPRATROPIUM BROMIDE 0.02 % IN SOLN
1.0000 mg | Freq: Once | RESPIRATORY_TRACT | Status: AC
  Administered 2018-01-14: 1 mg via RESPIRATORY_TRACT
  Filled 2018-01-14: qty 5

## 2018-01-14 NOTE — ED Provider Notes (Signed)
[facility] EMERGENCY DEPARTMENT Provider Note   CSN: [id number] Arrival date & time: 01/14/18  1636     History   Chief Complaint Chief Complaint  Patient presents with  . Weakness    HPI Katherine Tyler is a 84 y.o. female.   Weakness     Pt was seen at 1640. Per pt, c/o gradual onset and worsening of persistent cough, wheezing, and SOB for the past 2 to 3 days. Has been associated with bilat ribs "pain" that worsens with coughing and movement, as well as generalized weakness. SOB worsens with exertion. Pt was evaluated at her PMD's office PTA, then sent to the ED for O2 Sats 88% R/A. EMS states pt's O2 Sats increased to 95% on O2 2L N/C. Pt otherwise denies CP/palpitations, no back pain, no abd pain, no N/V/D, no fevers, no rash.    Past Medical History:  Diagnosis Date  . Anxiety   . Arthritis   . GERD (gastroesophageal reflux disease)   . Hiatal hernia   . Hyperlipidemia   . Hypertension   . Obesity   . Thyroid disease   . Vitamin D deficiency     Patient Active Problem List   Diagnosis Date Noted  . Iron deficiency anemia 10/01/2017  . Osteoarthritis 09/24/2016  . Essential hypertension   . Hyponatremia 06/18/2016  . Hepatic cirrhosis (HCC) 06/18/2016  . Hypokalemia 06/18/2016  . Metabolic syndrome 09/12/2015  . Obesity (BMI 30-39.9) 09/12/2015  . GAD (generalized anxiety disorder) 05/03/2015  . Varicose veins 06/24/2013  . Seasonal allergic rhinitis 04/30/2013  . Vitamin D deficiency 04/16/2013  . Hypothyroidism 04/16/2013  . Genu valgum (acquired) 04/16/2013    Past Surgical History:  Procedure Laterality Date  . CHOLECYSTECTOMY    . TUBAL LIGATION  1960    OB History    No data available       Home Medications    Prior to Admission medications   Medication Sig Start Date End Date Taking? Authorizing Provider  aspirin 81 MG tablet Take 81 mg by mouth daily.    [provider]  Cholecalciferol (D3-1000) 1000 UNITS capsule Take 1,000  Units by mouth daily.     [provider]  citalopram (CELEXA) 20 MG tablet TAKE ONE TABLET BY MOUTH EVERY DAY 01/06/18   Jannifer Rodney A, FNP  cloNIDine (CATAPRES) 0.1 MG tablet Take 1 tablet (0.1 mg total) by mouth daily. 10/01/17   Junie Spencer, FNP  diltiazem (CARDIZEM CD) 180 MG 24 hr capsule Take 1 capsule (180 mg total) by mouth 2 (two) times daily. 10/01/17   Jannifer Rodney A, FNP  Ferrous Sulfate Dried (SLOW RELEASE IRON) 45 MG TBCR Take 45 mg by mouth 2 (two) times daily.     [provider]  levothyroxine (SYNTHROID) 112 MCG tablet Take 1 tablet (112 mcg total) by mouth daily before breakfast. 10/01/17   Jannifer Rodney A, FNP  losartan (COZAAR) 100 MG tablet Take 1 tablet (100 mg total) by mouth daily. 10/01/17   Jannifer Rodney A, FNP  magnesium oxide (MAG-OX) 400 (241.3 MG) MG tablet Take 1 tablet (400 mg total) by mouth daily. 02/14/15   Jannifer Rodney A, FNP  metoprolol succinate (TOPROL-XL) 50 MG 24 hr tablet TAKE ONE TABLET BY MOUTH EVERY DAY. TAKEWITH OR IMMEDIATELY FOLLOWINGA MEAL. 07/31/17   Jannifer Rodney A, FNP  nystatin (MYCOSTATIN/NYSTOP) powder Apply topically 4 (four) times daily. 10/01/17   Jannifer Rodney A, FNP  thiamine (VITAMIN B-1) 100 MG tablet Take 100  mg by mouth daily.    [provider]    Family History Family History  Problem Relation Age of Onset  . Heart disease Mother   . Stroke Father   . Heart disease Sister   . Heart disease Brother   . Heart disease Sister   . Heart disease Sister   . Heart disease Brother   . Heart disease Brother   . Diabetes Daughter     Social History Social History   Tobacco Use  . Smoking status: Never Smoker  . Smokeless tobacco: Former Neurosurgeon    Types: Snuff  Substance Use Topics  . Alcohol use: No  . Drug use: No     Allergies   Terazosin hcl and Keflex [cephalexin]   Review of Systems Review of Systems  Neurological: Positive for weakness.  ROS: Statement: All systems  negative except as marked or noted in the HPI; Constitutional: Negative for fever and chills. +generalized weakness/fatigue.; ; Eyes: Negative for eye pain, redness and discharge. ; ; ENMT: Negative for ear pain, hoarseness, nasal congestion, sinus pressure and sore throat. ; ; Cardiovascular: Negative for chest pain, palpitations, diaphoresis, and peripheral edema. ; ; Respiratory: +cough, wheezing, SOB. Negative for stridor. ; ; Gastrointestinal: Negative for nausea, vomiting, diarrhea, abdominal pain, blood in stool, hematemesis, jaundice and rectal bleeding. . ; ; Genitourinary: Negative for dysuria, flank pain and hematuria. ; ; Musculoskeletal: Negative for back pain and neck pain. Negative for swelling and trauma.; ; Skin: Negative for pruritus, rash, abrasions, blisters, bruising and skin lesion.; ; Neuro: Negative for headache, lightheadedness and neck stiffness. Negative for altered level of consciousness, altered mental status, extremity weakness, paresthesias, involuntary movement, seizure and syncope.    Physical Exam Updated Vital Signs BP (!) 190/49   Pulse (!) 59   Resp (!) 27   SpO2 100%    Patient Vitals for the past 24 hrs:  BP Pulse Resp SpO2  01/14/18 1800 (!) 190/49 (!) 59 (!) 27 100 %  01/14/18 1745 - (!) 58 (!) 26 100 %  01/14/18 1736 - - - 96 %  01/14/18 1730 (!) 201/58 (!) 58 17 99 %  01/14/18 1700 (!) 199/108 (!) 45 (!) 33 97 %     Physical Exam 1645: Physical examination:  Nursing notes reviewed; Vital signs and O2 SAT reviewed;  Constitutional: Well developed, Well nourished, Well hydrated, Uncomfortable appearing.;; Head:  Normocephalic, atraumatic; Eyes: EOMI, PERRL, No scleral icterus; ENMT: Mouth and pharynx normal, Mucous membranes moist; Neck: Supple, Full range of motion, No lymphadenopathy; Cardiovascular: Bradycardic rate and rhythm, No gallop; Respiratory: Breath sounds coarse & equal bilaterally, insp/exp wheezes bilat. Faint audible wheezing.  Speaking  short sentences, sitting upright, tachypneic.; Chest: Nontender, Movement normal; Abdomen: Soft, Nontender, Nondistended, Normal bowel sounds; Genitourinary: No CVA tenderness; Extremities: Pulses normal, No tenderness, +1 pedal edema bilat. No calf asymmetry.; Neuro: AA&Ox3, Major CN grossly intact.  Speech clear. No gross focal motor or sensory deficits in extremities.; Skin: Color normal, Warm, Dry.   ED Treatments / Results  Labs (all labs ordered are listed, but only abnormal results are displayed)   EKG  EKG Interpretation  Date/Time:  Tuesday January 14 2018 16:40:49 EST Ventricular Rate:  49 PR Interval:    QRS Duration: 176 QT Interval:  494 QTC Calculation: 446 R Axis:   31 Text Interpretation:  Sinus bradycardia Right bundle branch block Left ventricular hypertrophy Artifact When compared with ECG of 06/18/2016 Rate slower Confirmed by Samuel Jester [phone]) on  01/14/2018 4:54:46 PM       Radiology   Procedures Procedures (including critical care time)  Medications Ordered in ED Medications  albuterol (PROVENTIL,VENTOLIN) solution continuous neb (not administered)  ipratropium (ATROVENT) nebulizer solution 1 mg (not administered)     Initial Impression / Assessment and Plan / ED Course  I have reviewed the triage vital signs and the nursing notes.  Pertinent labs & imaging results that were available during my care of the patient were reviewed by me and considered in my medical decision making (see chart for details).  MDM Reviewed: previous chart, nursing note and vitals Reviewed previous: labs and ECG Interpretation: labs, ECG and x-ray Total time providing critical care: 30-74 minutes. This excludes time spent performing separately reportable procedures and services. Consults: admitting MD   CRITICAL CARE Performed by: Laray Anger Total critical care time: 35 minutes Critical care time was exclusive of separately billable procedures and  treating other patients. Critical care was necessary to treat or prevent imminent or life-threatening deterioration. Critical care was time spent personally by me on the following activities: development of treatment plan with patient and/or surrogate as well as nursing, discussions with consultants, evaluation of patient's response to treatment, examination of patient, obtaining history from patient or surrogate, ordering and performing treatments and interventions, ordering and review of laboratory studies, ordering and review of radiographic studies, pulse oximetry and re-evaluation of patient's condition.   Results for orders placed or performed during the hospital encounter of 01/14/18  Basic metabolic panel  Result Value Ref Range   Sodium 121 (L) 135 - 145 mmol/L   Potassium 5.2 (H) 3.5 - 5.1 mmol/L   Chloride 85 (L) 101 - 111 mmol/L   CO2 25 22 - 32 mmol/L   Glucose, Bld 123 (H) 65 - 99 mg/dL   BUN 29 (H) 6 - 20 mg/dL   Creatinine, Ser 1.61 (H) 0.44 - 1.00 mg/dL   Calcium 9.6 8.9 - 09.6 mg/dL   GFR calc non Af Amer 43 (L) >60 mL/min   GFR calc Af Amer 50 (L) >60 mL/min   Anion gap 11 5 - 15  Brain natriuretic peptide  Result Value Ref Range   B Natriuretic Peptide 1,398.0 (H) 0.0 - 100.0 pg/mL  Troponin I  Result Value Ref Range   Troponin I 0.03 (HH) <0.03 ng/mL  Lactic acid, plasma  Result Value Ref Range   Lactic Acid, Venous 0.9 0.5 - 1.9 mmol/L  CBC with Differential  Result Value Ref Range   WBC 7.8 4.0 - 10.5 K/uL   RBC 2.95 (L) 3.87 - 5.11 MIL/uL   Hemoglobin 9.6 (L) 12.0 - 15.0 g/dL   HCT 04.5 (L) 40.9 - 81.1 %   MCV 98.0 78.0 - 100.0 fL   MCH 32.5 26.0 - 34.0 pg   MCHC 33.2 30.0 - 36.0 g/dL   RDW 91.4 78.2 - 95.6 %   Platelets 146 (L) 150 - 400 K/uL   Neutrophils Relative % 86 %   Neutro Abs 6.7 1.7 - 7.7 K/uL   Lymphocytes Relative 5 %   Lymphs Abs 0.4 (L) 0.7 - 4.0 K/uL   Monocytes Relative 9 %   Monocytes Absolute 0.7 0.1 - 1.0 K/uL   Eosinophils  Relative 0 %   Eosinophils Absolute 0.0 0.0 - 0.7 K/uL   Basophils Relative 0 %   Basophils Absolute 0.0 0.0 - 0.1 K/uL  Influenza panel by PCR (type A & B)  Result Value Ref Range  Influenza A By PCR NEGATIVE NEGATIVE   Influenza B By PCR NEGATIVE NEGATIVE   Dg Chest Port 1 View Result Date: 01/14/2018 CLINICAL DATA:  weakness and sob, non productive cough today. 02 was in 80s and hr in 40s. Pt has audible wheezing. Normal hr is high 50 and low 60s. History of HTN, Hiatal hernia. EXAM: PORTABLE CHEST 1 VIEW COMPARISON:  07/07/2009 FINDINGS: Lung volumes are low. There is bilateral vascular congestion with central interstitial thickening mild hazy central airspace opacity, representing a change from prior study. No focal areas of lung consolidation. No convincing pleural effusion.  No pneumothorax. Cardiac silhouette is borderline enlarged. No mediastinal or hilar masses. Skeletal structures are demineralized but grossly intact. IMPRESSION: 1. Central vascular congestion, interstitial thickening and mild central hazy ground-glass opacities, all of which are accentuated by low lung volumes. Findings most suggestive of mild congestive heart failure. Multifocal infection is possible. Electronically Signed   By: Amie Portlandavid  Ormond M.D.   On: 01/14/2018 17:09   Results for Katherine Tyler, Katherine Tyler (MRN [id number]) as of 01/14/2018 18:29  Ref. Range 01/28/2017 10:18 05/30/2017 08:40 10/01/2017 08:53 01/14/2018 17:00  Sodium Latest Ref Range: 135 - 145 mmol/L 133 (L) 125 (L) 131 (L) 121 (L)   Results for Katherine Tyler, Katherine Tyler (MRN [id number]) as of 01/14/2018 18:29  Ref. Range 06/18/2016 07:42 06/18/2016 12:43 06/19/2016 07:10 10/01/2017 08:53 01/14/2018 17:00  Hemoglobin Latest Ref Range: 12.0 - 15.0 g/dL 96.212.9 95.213.6 84.112.8 32.410.9 (L) 9.6 (L)  HCT Latest Ref Range: 36.0 - 46.0 % 36.5 40.0 36.8 31.7 (L) 28.9 (L)      1830:  Pt wheezing on arrival:  Hour long neb started. Sats remain 100% while on neb. CXR with vascular congestion and BNP newly  elevated; IV lasix ordered. H/H lower than previous; stool heme negative. BUN/Cr per baseline. Dx and testing d/w pt and family.  Questions answered.  Verb understanding, agreeable to admit.  T/C to [facility] Dr. Selena BattenKim, case discussed, including:  HPI, pertinent PM/SHx, VS/PE, dx testing, ED course and treatment:  Agreeable to admit.     Final Clinical Impressions(s) / ED Diagnoses   Final diagnoses:  None    ED Discharge Orders    None        Samuel JesterMcManus, Elige Shouse, DO 01/18/18 1631

## 2018-01-14 NOTE — H&P (Addendum)
[facility] H&P   Patient Demographics:    Katherine Tyler, is a 84 y.o. female  MRN: [id number]   DOB - 08/05/1929  Admit Date - 01/14/2018  Outpatient Primary MD for the patient is Junie SpencerHawks, Christy A, FNP  Referring MD/NP/PA:   Samuel JesterKathleen McManus  Outpatient Specialists:     Patient coming from:  home  Chief Complaint  Patient presents with  . Weakness      HPI:    Katherine IslamMary Tenenbaum  is a 84 y.o. female, w hypertension, hyperlipidemia, obeisity, gerd, apparently c/o dyspnea for the past week, intermittent cough w white sputum.  Pt notes slight orthopnea. + weight gain and lower ext edema. Pt denies fever, chills, cp, palp, n/v, diarrhea, brbpr.   Pt presented to pcp earlier today and pox 88% on RA and sent to ER for evaluation.    In Ed,  IMPRESSION: 1. Central vascular congestion, interstitial thickening and mild central hazy ground-glass opacities, all of which are accentuated by low lung volumes. Findings most suggestive of mild congestive heart failure. Multifocal infection is possible.  EKG  Na 121, K 5.2, Bun 29, Creatinine 1.12 Trop 0.03 BNP 1,398 Influenza negative  EKG sinus brady at 50, nl axis, RBBB  Pt will be admitted for CHF, and troponin elevation.     Review of systems:    In addition to the HPI above, No Fever-chills, No Headache, No changes with Vision or hearing, No problems swallowing food or Liquids, No Chest pain, No Abdominal pain, No Nausea or Vommitting, Bowel movements are regular, No Blood in stool or Urine, No dysuria, No new skin rashes or bruises, No new joints pains-aches,  No new weakness, tingling, numbness in any extremity, No recent weight gain or loss, No polyuria, polydypsia or polyphagia, No significant Mental Stressors.  A full 10 point Review of Systems was done, except as stated above, all other Review of Systems were  negative.   With Past History of the following :    Past Medical History:  Diagnosis Date  . Anxiety   . Arthritis   . GERD (gastroesophageal reflux disease)   . Hiatal hernia   . Hyperlipidemia   . Hypertension   . Obesity   . Thyroid disease   . Vitamin D deficiency       Past Surgical History:  Procedure Laterality Date  . CHOLECYSTECTOMY    . TUBAL LIGATION  1960      Social History:     Social History   Tobacco Use  . Smoking status: Never Smoker  . Smokeless tobacco: Former NeurosurgeonUser    Types: Snuff  Substance Use Topics  . Alcohol use: No     Lives - at home  Mobility - walking by self   Family History :     Family History  Problem Relation Age of Onset  . Heart disease Mother   .  Stroke Father   . Heart disease Sister   . Heart disease Brother   . Heart disease Sister   . Heart disease Sister   . Heart disease Brother   . Heart disease Brother   . Diabetes Daughter       Home Medications:   Prior to Admission medications   Medication Sig Start Date End Date Taking? Authorizing Provider  aspirin 81 MG tablet Take 81 mg by mouth daily.   Yes [provider]  Cholecalciferol (D3-1000) 1000 UNITS capsule Take 1,000 Units by mouth daily.    Yes [provider]  citalopram (CELEXA) 20 MG tablet TAKE ONE TABLET BY MOUTH EVERY DAY 01/06/18  Yes Hawks, Christy A, FNP  cloNIDine (CATAPRES) 0.1 MG tablet Take 1 tablet (0.1 mg total) by mouth daily. 10/01/17  Yes Hawks, Christy A, FNP  diltiazem (CARDIZEM CD) 180 MG 24 hr capsule Take 1 capsule (180 mg total) by mouth 2 (two) times daily. 10/01/17  Yes Hawks, Christy A, FNP  Ferrous Sulfate Dried (SLOW RELEASE IRON) 45 MG TBCR Take 45 mg by mouth 2 (two) times daily.    Yes [provider]  levothyroxine (SYNTHROID) 112 MCG tablet Take 1 tablet (112 mcg total) by mouth daily before breakfast. 10/01/17  Yes Hawks, Christy A, FNP  losartan (COZAAR) 100 MG tablet Take 1 tablet (100  mg total) by mouth daily. 10/01/17  Yes Hawks, Christy A, FNP  magnesium oxide (MAG-OX) 400 (241.3 MG) MG tablet Take 1 tablet (400 mg total) by mouth daily. 02/14/15  Yes Hawks, Christy A, FNP  metoprolol succinate (TOPROL-XL) 50 MG 24 hr tablet TAKE ONE TABLET BY MOUTH EVERY DAY. TAKEWITH OR IMMEDIATELY FOLLOWINGA MEAL. 07/31/17  Yes Hawks, Christy A, FNP  nystatin (MYCOSTATIN/NYSTOP) powder Apply topically 4 (four) times daily. 10/01/17  Yes Hawks, Christy A, FNP  thiamine (VITAMIN B-1) 100 MG tablet Take 100 mg by mouth daily.   Yes [provider]     Allergies:     Allergies  Allergen Reactions  . Terazosin Hcl Other (See Comments)    Muscle aches  . Keflex [Cephalexin] Other (See Comments)    unknown     Physical Exam:   Vitals  Blood pressure (!) 181/58, pulse 77, resp. rate (!) 25, SpO2 93 %.   1. General  lying in bed in NAD,    2. Normal affect and insight, Not Suicidal or Homicidal, Awake Alert, Oriented X 3.  3. No F.N deficits, ALL C.Nerves Intact, Strength 5/5 all 4 extremities, Sensation intact all 4 extremities, Plantars down going.  4. Ears and Eyes appear Normal, Conjunctivae clear, PERRLA. Moist Oral Mucosa.  5. Supple Neck,   +   JVD, No cervical lymphadenopathy appriciated, No Carotid Bruits.  6. Symmetrical Chest wall movement, slight decrease in bs at bilateral base, faint crackle at bil base, on wheezing.   7. RRR, s1, s2, 2/6 sem rusb  8. Positive Bowel Sounds, Abdomen Soft, No tenderness, No organomegaly appriciated,No rebound -guarding or rigidity.  9.  No Cyanosis, 1+ edema  , No Skin Rash or Bruise.  10. Good muscle tone,  joints appear normal , no effusions, Normal ROM.  11. No Palpable Lymph Nodes in Neck or Axillae     Data Review:    CBC Recent Labs  Lab 01/14/18 1700  WBC 7.8  HGB 9.6*  HCT 28.9*  PLT 146*  MCV 98.0  MCH 32.5  MCHC 33.2  RDW 13.6  LYMPHSABS 0.4*  MONOABS 0.7  EOSABS 0.0  BASOSABS 0.0    ------------------------------------------------------------------------------------------------------------------  Chemistries  Recent Labs  Lab 01/14/18 1700  NA 121*  K 5.2*  CL 85*  CO2 25  GLUCOSE 123*  BUN 29*  CREATININE 1.12*  CALCIUM 9.6   ------------------------------------------------------------------------------------------------------------------ CrCl cannot be calculated (Unknown ideal weight.). ------------------------------------------------------------------------------------------------------------------ No results for input(s): TSH, T4TOTAL, T3FREE, THYROIDAB in the last 72 hours.  Invalid input(s): FREET3  Coagulation profile No results for input(s): INR, PROTIME in the last 168 hours. ------------------------------------------------------------------------------------------------------------------- No results for input(s): DDIMER in the last 72 hours. -------------------------------------------------------------------------------------------------------------------  Cardiac Enzymes Recent Labs  Lab 01/14/18 1700  TROPONINI 0.03*   ------------------------------------------------------------------------------------------------------------------    Component Value Date/Time   BNP 1,398.0 (H) 01/14/2018 1701     ---------------------------------------------------------------------------------------------------------------  Urinalysis    Component Value Date/Time   COLORURINE YELLOW 06/18/2016 0805   APPEARANCEUR CLEAR 06/18/2016 0805   LABSPEC 1.010 06/18/2016 0805   PHURINE 7.5 06/18/2016 0805   GLUCOSEU NEGATIVE 06/18/2016 0805   HGBUR NEGATIVE 06/18/2016 0805   BILIRUBINUR NEGATIVE 06/18/2016 0805   KETONESUR NEGATIVE 06/18/2016 0805   PROTEINUR NEGATIVE 06/18/2016 0805   UROBILINOGEN 1.0 03/08/2008 1015   NITRITE POSITIVE (A) 06/18/2016 0805   LEUKOCYTESUR SMALL (A) 06/18/2016 0805     ----------------------------------------------------------------------------------------------------------------   Imaging Results:    Dg Chest Port 1 View  Result Date: 01/14/2018 CLINICAL DATA:  weakness and sob, non productive cough today. 02 was in 80s and hr in 40s. Pt has audible wheezing. Normal hr is high 50 and low 60s. History of HTN, Hiatal hernia. EXAM: PORTABLE CHEST 1 VIEW COMPARISON:  07/07/2009 FINDINGS: Lung volumes are low. There is bilateral vascular congestion with central interstitial thickening mild hazy central airspace opacity, representing a change from prior study. No focal areas of lung consolidation. No convincing pleural effusion.  No pneumothorax. Cardiac silhouette is borderline enlarged. No mediastinal or hilar masses. Skeletal structures are demineralized but grossly intact. IMPRESSION: 1. Central vascular congestion, interstitial thickening and mild central hazy ground-glass opacities, all of which are accentuated by low lung volumes. Findings most suggestive of mild congestive heart failure. Multifocal infection is possible. Electronically Signed   By: Amie Portland M.D.   On: 01/14/2018 17:09       Assessment & Plan:    Principal Problem:   CHF (congestive heart failure) (HCC) Active Problems:   Hypothyroidism   Hyponatremia   Essential hypertension   Iron deficiency anemia   Hyperkalemia    Dyspnea secondary to CHF Tele Trop I q6h x3 Cont losartan Cont metoprolol Hold Cardizem Start Lasix 40mg  iv qday Start Ntp 1 inch topically q8h Check cardiac echo Cardiology consult placed in computer for AM consult  Hyponatremia Check serum osm, cortisol, tsh Check urine sodium, urine osm Check cmp in am  Iron def anemia Check cbc in am  Hypothyroidism Cont levothyroxine  Hyperkalemia (mild) Repeat cmp in am    DVT Prophylaxis Lovenox - SCDs  AM Labs Ordered, also please review Full Orders  Family Communication: Admission, patients  condition and plan of care including tests being ordered have been discussed with the patient  who indicate understanding and agree with the plan and Code Status.  Code Status FULL CODE  Likely DC to  home  Condition GUARDED    Consults called: cardiology  Admission status: inpatient  Time spent in minutes : 45   Pearson Grippe M.D on 01/14/2018 at 7:37 PM  Between 7am to 7pm - Pager - [phone]   .  After 7pm go to [email or web address] - password [facility]  [facility] - Office  [phone]

## 2018-01-14 NOTE — ED Triage Notes (Signed)
Pt a/o. C/o gen weakness and sob today. Seen pcp. 02 was in 80s and hr in 40s. Pt has audible wheezing. Normal hr is high 50 and low 60s.

## 2018-01-14 NOTE — ED Notes (Signed)
Pt c/o cough x 2 days non prod. Nad. States ribs hurt due to coughing and is worse with coughing

## 2018-01-14 NOTE — ED Notes (Addendum)
Lasix given. will in and out cath if pt has not voided soon

## 2018-01-14 NOTE — Progress Notes (Signed)
   HPI  Patient presents today shortness of breath.  Patient states that she has had symptoms off and on for 3 days. She reports bilateral rib pain with movement and shortness of breath. Nurses report that when she tried to get out of the car today she had difficulty due to shortness of breath.  Initial oxygen saturation was 88%, now better on nasal cannula  She denies fever, chills, sweats.  She denies any recent long trips or new medications.  PMH: Smoking status noted ROS: Per HPI  Objective: BP (!) 174/64 (BP Location: Left Wrist, Patient Position: Sitting, Cuff Size: Normal)   Pulse (!) 49   Temp 98.5 F (36.9 C)   Resp (!) 33   SpO2 95% Comment: 2 liters Gen: NAD, alert, cooperative with exam HEENT: NCAT CV: RRR, good S1/S2, no murmur appreciated Resp: Labored breathing, crackles throughout with moderate air movement Neuro: Alert and oriented, No gross deficits  Assessment and plan:  #Shortness of breath Patient with original oxygen saturation 88%, now satting 95% on oxygen via nasal cannula Bradycardia at 43 with EKG with stable right bundle branch Recommended with bradycardia, new oxygen need, and likely pulmonary edema versus other acute pulmonary process that she be evaluated in the emergency room.  Orders Placed This Encounter  Procedures  . EKG 12-Lead    Murtis SinkSam Tida Saner, MD [facility] 01/14/2018, 3:35 PM

## 2018-01-15 ENCOUNTER — Inpatient Hospital Stay (HOSPITAL_COMMUNITY): Payer: Medicare Other

## 2018-01-15 DIAGNOSIS — I10 Essential (primary) hypertension: Secondary | ICD-10-CM

## 2018-01-15 DIAGNOSIS — I340 Nonrheumatic mitral (valve) insufficiency: Secondary | ICD-10-CM

## 2018-01-15 DIAGNOSIS — I361 Nonrheumatic tricuspid (valve) insufficiency: Secondary | ICD-10-CM

## 2018-01-15 DIAGNOSIS — R0603 Acute respiratory distress: Secondary | ICD-10-CM

## 2018-01-15 DIAGNOSIS — D649 Anemia, unspecified: Secondary | ICD-10-CM

## 2018-01-15 DIAGNOSIS — E875 Hyperkalemia: Secondary | ICD-10-CM

## 2018-01-15 LAB — OSMOLALITY, URINE: Osmolality, Ur: 239 mOsm/kg — ABNORMAL LOW (ref 300–900)

## 2018-01-15 LAB — COMPREHENSIVE METABOLIC PANEL
ALBUMIN: 3.6 g/dL (ref 3.5–5.0)
ALK PHOS: 63 U/L (ref 38–126)
ALT: 35 U/L (ref 14–54)
ANION GAP: 9 (ref 5–15)
AST: 36 U/L (ref 15–41)
BILIRUBIN TOTAL: 0.7 mg/dL (ref 0.3–1.2)
BUN: 26 mg/dL — ABNORMAL HIGH (ref 6–20)
CALCIUM: 9.1 mg/dL (ref 8.9–10.3)
CO2: 28 mmol/L (ref 22–32)
Chloride: 84 mmol/L — ABNORMAL LOW (ref 101–111)
Creatinine, Ser: 1.13 mg/dL — ABNORMAL HIGH (ref 0.44–1.00)
GFR calc Af Amer: 50 mL/min — ABNORMAL LOW (ref >60)
GFR calc non Af Amer: 43 mL/min — ABNORMAL LOW (ref >60)
GLUCOSE: 111 mg/dL — AB (ref 65–99)
Potassium: 4.2 mmol/L (ref 3.5–5.1)
Sodium: 121 mmol/L — ABNORMAL LOW (ref 135–145)
TOTAL PROTEIN: 6.6 g/dL (ref 6.5–8.1)

## 2018-01-15 LAB — ECHOCARDIOGRAM COMPLETE
Height: 62 in
WEIGHTICAEL: 3820.13 [oz_av]

## 2018-01-15 LAB — CBC
HCT: 27.3 % — ABNORMAL LOW (ref 36.0–46.0)
HEMOGLOBIN: 9.1 g/dL — AB (ref 12.0–15.0)
MCH: 32.9 pg (ref 26.0–34.0)
MCHC: 33.3 g/dL (ref 30.0–36.0)
MCV: 98.6 fL (ref 78.0–100.0)
PLATELETS: 133 10*3/uL — AB (ref 150–400)
RBC: 2.77 MIL/uL — ABNORMAL LOW (ref 3.87–5.11)
RDW: 13.7 % (ref 11.5–15.5)
WBC: 7.1 10*3/uL (ref 4.0–10.5)

## 2018-01-15 LAB — TROPONIN I: Troponin I: 0.03 ng/mL (ref <0.03)

## 2018-01-15 LAB — OSMOLALITY: OSMOLALITY: 266 mosm/kg — AB (ref 275–295)

## 2018-01-15 LAB — CORTISOL: CORTISOL PLASMA: 16.7 ug/dL

## 2018-01-15 LAB — OCCULT BLOOD, POC DEVICE: FECAL OCCULT BLD: NEGATIVE

## 2018-01-15 LAB — TSH: TSH: 2.773 u[IU]/mL (ref 0.350–4.500)

## 2018-01-15 LAB — SODIUM, URINE, RANDOM: SODIUM UR: 35 mmol/L

## 2018-01-15 LAB — GLUCOSE, CAPILLARY: Glucose-Capillary: 98 mg/dL (ref 65–99)

## 2018-01-15 LAB — MRSA PCR SCREENING: MRSA BY PCR: NEGATIVE

## 2018-01-15 MED ORDER — FUROSEMIDE 10 MG/ML IJ SOLN
20.0000 mg | Freq: Two times a day (BID) | INTRAMUSCULAR | Status: DC
  Administered 2018-01-15: 20 mg via INTRAVENOUS
  Filled 2018-01-15: qty 2

## 2018-01-15 MED ORDER — ASPIRIN EC 81 MG PO TBEC
81.0000 mg | DELAYED_RELEASE_TABLET | Freq: Every day | ORAL | Status: DC
  Administered 2018-01-15 – 2018-01-17 (×3): 81 mg via ORAL
  Filled 2018-01-15 (×3): qty 1

## 2018-01-15 MED ORDER — VITAMIN D 1000 UNITS PO TABS
1000.0000 [IU] | ORAL_TABLET | Freq: Every day | ORAL | Status: DC
  Administered 2018-01-15 – 2018-01-18 (×4): 1000 [IU] via ORAL
  Filled 2018-01-15 (×4): qty 1

## 2018-01-15 MED ORDER — SODIUM CHLORIDE 0.9% FLUSH
3.0000 mL | INTRAVENOUS | Status: DC | PRN
Start: 2018-01-15 — End: 2018-01-18

## 2018-01-15 MED ORDER — LOSARTAN POTASSIUM 50 MG PO TABS
100.0000 mg | ORAL_TABLET | Freq: Every day | ORAL | Status: DC
  Administered 2018-01-15: 100 mg via ORAL
  Filled 2018-01-15: qty 2

## 2018-01-15 MED ORDER — METOPROLOL SUCCINATE ER 50 MG PO TB24
50.0000 mg | ORAL_TABLET | Freq: Every day | ORAL | Status: DC
  Administered 2018-01-15: 50 mg via ORAL
  Filled 2018-01-15: qty 1

## 2018-01-15 MED ORDER — SODIUM CHLORIDE 0.9% FLUSH
3.0000 mL | Freq: Two times a day (BID) | INTRAVENOUS | Status: DC
  Administered 2018-01-15 – 2018-01-18 (×8): 3 mL via INTRAVENOUS

## 2018-01-15 MED ORDER — MAGNESIUM OXIDE 400 (241.3 MG) MG PO TABS
400.0000 mg | ORAL_TABLET | Freq: Every day | ORAL | Status: DC
  Administered 2018-01-15 – 2018-01-18 (×4): 400 mg via ORAL
  Filled 2018-01-15 (×4): qty 1

## 2018-01-15 MED ORDER — ACETAMINOPHEN 325 MG PO TABS
650.0000 mg | ORAL_TABLET | Freq: Four times a day (QID) | ORAL | Status: DC | PRN
  Filled 2018-01-15: qty 2

## 2018-01-15 MED ORDER — FUROSEMIDE 10 MG/ML IJ SOLN
20.0000 mg | Freq: Every day | INTRAMUSCULAR | Status: DC
  Administered 2018-01-15: 20 mg via INTRAVENOUS
  Filled 2018-01-15: qty 2

## 2018-01-15 MED ORDER — LEVOTHYROXINE SODIUM 112 MCG PO TABS
112.0000 ug | ORAL_TABLET | Freq: Every day | ORAL | Status: DC
  Administered 2018-01-15 – 2018-01-18 (×4): 112 ug via ORAL
  Filled 2018-01-15 (×4): qty 1

## 2018-01-15 MED ORDER — ENOXAPARIN SODIUM 40 MG/0.4ML ~~LOC~~ SOLN
40.0000 mg | SUBCUTANEOUS | Status: DC
  Administered 2018-01-15 – 2018-01-17 (×3): 40 mg via SUBCUTANEOUS
  Filled 2018-01-15 (×3): qty 0.4

## 2018-01-15 MED ORDER — LOSARTAN POTASSIUM 50 MG PO TABS: 50.0000 mg | ORAL_TABLET | Freq: Every day | ORAL | Status: DC

## 2018-01-15 MED ORDER — DILTIAZEM HCL ER COATED BEADS 180 MG PO CP24
180.0000 mg | ORAL_CAPSULE | Freq: Every day | ORAL | Status: DC
  Administered 2018-01-15 – 2018-01-18 (×4): 180 mg via ORAL
  Filled 2018-01-15 (×4): qty 1

## 2018-01-15 MED ORDER — SODIUM CHLORIDE 0.9 % IV SOLN: 250.0000 mL | INTRAVENOUS | Status: DC | PRN

## 2018-01-15 MED ORDER — FERROUS SULFATE 325 (65 FE) MG PO TABS
325.0000 mg | ORAL_TABLET | Freq: Two times a day (BID) | ORAL | Status: DC
  Administered 2018-01-15 – 2018-01-18 (×7): 325 mg via ORAL
  Filled 2018-01-15 (×7): qty 1

## 2018-01-15 MED ORDER — ACETAMINOPHEN 650 MG RE SUPP: 650.0000 mg | Freq: Four times a day (QID) | RECTAL | Status: DC | PRN

## 2018-01-15 MED ORDER — VITAMIN B-1 100 MG PO TABS
100.0000 mg | ORAL_TABLET | Freq: Every day | ORAL | Status: DC
  Administered 2018-01-15 – 2018-01-18 (×4): 100 mg via ORAL
  Filled 2018-01-15 (×4): qty 1

## 2018-01-15 MED ORDER — LOSARTAN POTASSIUM 50 MG PO TABS
100.0000 mg | ORAL_TABLET | Freq: Every day | ORAL | Status: DC
  Administered 2018-01-15 – 2018-01-18 (×4): 100 mg via ORAL
  Filled 2018-01-15 (×4): qty 2

## 2018-01-15 MED ORDER — CITALOPRAM HYDROBROMIDE 20 MG PO TABS
20.0000 mg | ORAL_TABLET | Freq: Every day | ORAL | Status: DC
  Administered 2018-01-15 – 2018-01-18 (×4): 20 mg via ORAL
  Filled 2018-01-15 (×4): qty 1

## 2018-01-15 NOTE — Progress Notes (Signed)
Alerted by tele that pt had converted to afib rhythm, pt and vitals stable, MD made aware.

## 2018-01-15 NOTE — Progress Notes (Signed)
*  PRELIMINARY RESULTS* Echocardiogram 2D Echocardiogram has been performed.  Katherine Tyler, Katherine Tyler 01/15/2018, 3:33 PM

## 2018-01-15 NOTE — Progress Notes (Signed)
PROGRESS NOTE    Patient: Katherine Tyler     PCP: Junie Spencer, FNP                    DOB: 1929-04-13            DOA: 01/14/2018 ZOX:[id number]             DOS: 01/15/2018, 11:35 AM    LOS: 1 day   Date of Service: the patient was seen and examined on 01/15/2018  Subjective:   Patient was seen and examined this morning, stable, reporting much improved shortness of breath.  Denies any chest pain.  No issues overnight.  ----------------------------------------------------------------------------------------------------------------------  Brief Narrative:  Ms. Katherine Tyler is a 84 year old female with past medical history of hypertension, hyperlipidemia, obesity, who was admitted to 04/28/2018 for shortness of breath, productive sputum.  Initial O2 saturation on room air was 88%, was placed on O2 via nasal cannula.  Patient reported she has been having shortness of breath for the past 3 days has been progressively getting worse along with worsening edema in her lower extremities.    Principal Problem:   CHF (congestive heart failure) (HCC) Active Problems:   Hypothyroidism   Hyponatremia   Essential hypertension   Iron deficiency anemia   Hyperkalemia   Assessment & Plan:     CHF (congestive heart failure) (HCC)  Patient will be continue to monitor on telemetry bed, cardiac enzyme has been negative, will continue current medication of losartan, metoprolol, still holding Cardizem, continue IV Lasix at 40 mg IV, monitoring I's and O's, daily weight, continue O2 via nasal cannula, Will DC nitroglycerin. Chest x-ray revealing central vascular congestion, groundglass appearance, findings suggestive of CHF, 2D echocardiogram reporting -  Pending cardiology evaluation  Hypothyroidism -continue home dose Synthroid, Hyponatremia -mild, monitoring, continue IV fluid, SIDH workup negative,  cortisol level, TSH level normal Essential hypertension -elevated blood pressure, home medication will be  reviewed, resuming beta-blockers, losartan, resume beta-blockers, and Cardizem, still holding clonidine, Iron deficiency anemia -monitoring H&H, stable continue iron supplements Hyperkalemia -improved, monitoring    DVT prophylaxis:     SCDs/compression stockings    Lovenox Sq Code Status:         Full code   Family Communication:  The above findings and plan of care has been discussed with patient and family in detail, they expressed understanding and agreement of above.   Disposition Plan:  1-2 days,           Home             Consultants:   Cardiology  Procedures: 2D echocardiogram  Antimicrobials:  Anti-infectives (From admission, onward)   None     *  Objective: Vitals:   01/15/18 0500 01/15/18 0700 01/15/18 0739 01/15/18 0800  BP: (!) 177/73 (!) 187/83  (!) 178/79  Pulse: 68 70 73 73  Resp: 20 18 17 20   Temp:   98.4 F (36.9 C)   TempSrc:   Oral   SpO2: 100% 100% 100% 100%  Weight:      Height:        Intake/Output Summary (Last 24 hours) at 01/15/2018 1135 Last data filed at 01/15/2018 1001 Gross per 24 hour  Intake 3 ml  Output 1250 ml  Net -1247 ml   Filed Weights   01/15/18 0417  Weight: 108.3 kg (238 lb 12.1 oz)    Examination:  General exam: Appears calm and comfortable  Respiratory system: Clear to auscultation. Respiratory effort  normal. Cardiovascular system: S1 & S2 heard, RRR. No JVD, murmurs, rubs, gallops or clicks. No pedal edema. Gastrointestinal system: Abdomen is nondistended, soft and nontender. No organomegaly or masses felt. Normal bowel sounds heard. Central nervous system: Alert and oriented. No focal neurological deficits. Extremities: Symmetric 5 x 5 power. Skin: No rashes, lesions or ulcers Psychiatry: Judgement and insight appear normal. Mood & affect appropriate.     Data Reviewed: I have personally reviewed following labs and imaging studies  CBC: Recent Labs  Lab 01/14/18 1700 01/15/18 0108  WBC 7.8 7.1    NEUTROABS 6.7  --   HGB 9.6* 9.1*  HCT 28.9* 27.3*  MCV 98.0 98.6  PLT 146* 133*   Basic Metabolic Panel: Recent Labs  Lab 01/14/18 1700 01/15/18 0108  NA 121* 121*  K 5.2* 4.2  CL 85* 84*  CO2 25 28  GLUCOSE 123* 111*  BUN 29* 26*  CREATININE 1.12* 1.13*  CALCIUM 9.6 9.1   GFR: Estimated Creatinine Clearance: 41.4 mL/min (A) (by C-G formula based on SCr of 1.13 mg/dL (H)). Liver Function Tests: Recent Labs  Lab 01/15/18 0108  AST 36  ALT 35  ALKPHOS 63  BILITOT 0.7  PROT 6.6  ALBUMIN 3.6   No results for input(s): LIPASE, AMYLASE in the last 168 hours. No results for input(s): AMMONIA in the last 168 hours. Coagulation Profile: No results for input(s): INR, PROTIME in the last 168 hours. Cardiac Enzymes: Recent Labs  Lab 01/14/18 1700 01/14/18 1935 01/15/18 0108 01/15/18 0725  TROPONINI 0.03* 0.03* 0.03* <0.03  Thyroid Function Tests: Recent Labs    01/15/18 0108  TSH 2.773   Anemia Panel: No results for input(s): VITAMINB12, FOLATE, FERRITIN, TIBC, IRON, RETICCTPCT in the last 72 hours. Sepsis Labs: Recent Labs  Lab 01/14/18 1701  LATICACIDVEN 0.9    Recent Results (from the past 240 hour(s))  MRSA PCR Screening     Status: None   Collection Time: 01/15/18  3:58 AM  Result Value Ref Range Status   MRSA by PCR NEGATIVE NEGATIVE Final    Comment:        The GeneXpert MRSA Assay (FDA approved for NASAL specimens only), is one component of a comprehensive MRSA colonization surveillance program. It is not intended to diagnose MRSA infection nor to guide or monitor treatment for MRSA infections. Performed at [facility], [address] [place], [place] [address]        Radiology Studies: Dg Chest 2 View  Result Date: 01/15/2018 CLINICAL DATA:  Hypertension obesity, dyspnea EXAM: CHEST  2 VIEW COMPARISON:  01/14/2018 FINDINGS: Mild cardiomegaly with central vascular congestion. No focal pulmonary infiltrate or effusion. Aortic  atherosclerosis. No pneumothorax. Surgical clips in the right upper quadrant. IMPRESSION: Cardiomegaly with central vascular congestion. Negative for overt edema or focal pulmonary infiltrate. Electronically Signed   By: Jasmine PangKim  Fujinaga M.D.   On: 01/15/2018 01:40   Dg Chest Port 1 View  Result Date: 01/14/2018 CLINICAL DATA:  weakness and sob, non productive cough today. 02 was in 80s and hr in 40s. Pt has audible wheezing. Normal hr is high 50 and low 60s. History of HTN, Hiatal hernia. EXAM: PORTABLE CHEST 1 VIEW COMPARISON:  07/07/2009 FINDINGS: Lung volumes are low. There is bilateral vascular congestion with central interstitial thickening mild hazy central airspace opacity, representing a change from prior study. No focal areas of lung consolidation. No convincing pleural effusion.  No pneumothorax. Cardiac silhouette is borderline enlarged. No mediastinal or hilar masses. Skeletal structures  are demineralized but grossly intact. IMPRESSION: 1. Central vascular congestion, interstitial thickening and mild central hazy ground-glass opacities, all of which are accentuated by low lung volumes. Findings most suggestive of mild congestive heart failure. Multifocal infection is possible. Electronically Signed   By: Amie Portland M.D.   On: 01/14/2018 17:09    Scheduled Meds: . aspirin EC  81 mg Oral Daily  . cholecalciferol  1,000 Units Oral Daily  . citalopram  20 mg Oral Daily  . enoxaparin (LOVENOX) injection  40 mg Subcutaneous Q24H  . ferrous sulfate  325 mg Oral BID  . furosemide  20 mg Intravenous Daily  . levothyroxine  112 mcg Oral QAC breakfast  . losartan  100 mg Oral Daily  . magnesium oxide  400 mg Oral Daily  . metoprolol succinate  50 mg Oral Daily  . nitroGLYCERIN  1 inch Topical Q8H  . sodium chloride flush  3 mL Intravenous Q12H  . thiamine  100 mg Oral Daily   Continuous Infusions: . sodium chloride      Time spent: >25 minutes  Kendell Bane, MD [facility]  Pager [phone]  If 7PM-7AM, please contact night-coverage [email or web address] Password [email or web address] 01/15/2018, 11:35 AM

## 2018-01-15 NOTE — ED Notes (Signed)
CRITICAL VALUE ALERT  Critical Value: Troponin 0.03  Date & Time Notied:  01/15/18 & 0156 hrs  Provider Notified: Dr. Sherryll BurgerShah  Orders Received/Actions taken: N/A

## 2018-01-16 ENCOUNTER — Encounter (HOSPITAL_COMMUNITY): Payer: Self-pay | Admitting: Cardiology

## 2018-01-16 DIAGNOSIS — I4891 Unspecified atrial fibrillation: Secondary | ICD-10-CM | POA: Diagnosis not present

## 2018-01-16 LAB — CBC
HCT: 30.1 % — ABNORMAL LOW (ref 36.0–46.0)
HEMOGLOBIN: 9.9 g/dL — AB (ref 12.0–15.0)
MCH: 32.2 pg (ref 26.0–34.0)
MCHC: 32.9 g/dL (ref 30.0–36.0)
MCV: 98 fL (ref 78.0–100.0)
PLATELETS: 163 10*3/uL (ref 150–400)
RBC: 3.07 MIL/uL — ABNORMAL LOW (ref 3.87–5.11)
RDW: 13.5 % (ref 11.5–15.5)
WBC: 6 10*3/uL (ref 4.0–10.5)

## 2018-01-16 LAB — BASIC METABOLIC PANEL
ANION GAP: 11 (ref 5–15)
BUN: 19 mg/dL (ref 6–20)
CALCIUM: 9 mg/dL (ref 8.9–10.3)
CO2: 29 mmol/L (ref 22–32)
Chloride: 85 mmol/L — ABNORMAL LOW (ref 101–111)
Creatinine, Ser: 0.81 mg/dL (ref 0.44–1.00)
GLUCOSE: 94 mg/dL (ref 65–99)
Potassium: 3.4 mmol/L — ABNORMAL LOW (ref 3.5–5.1)
Sodium: 125 mmol/L — ABNORMAL LOW (ref 135–145)

## 2018-01-16 LAB — BRAIN NATRIURETIC PEPTIDE: B Natriuretic Peptide: 804 pg/mL — ABNORMAL HIGH (ref 0.0–100.0)

## 2018-01-16 LAB — MAGNESIUM: MAGNESIUM: 1.8 mg/dL (ref 1.7–2.4)

## 2018-01-16 MED ORDER — METOPROLOL SUCCINATE ER 50 MG PO TB24
100.0000 mg | ORAL_TABLET | Freq: Every day | ORAL | Status: DC
  Administered 2018-01-16 – 2018-01-18 (×3): 100 mg via ORAL
  Filled 2018-01-16 (×3): qty 2

## 2018-01-16 MED ORDER — FLECAINIDE ACETATE 50 MG PO TABS
150.0000 mg | ORAL_TABLET | Freq: Once | ORAL | Status: AC
  Administered 2018-01-16: 150 mg via ORAL
  Filled 2018-01-16: qty 3

## 2018-01-16 MED ORDER — POTASSIUM CHLORIDE CRYS ER 20 MEQ PO TBCR
20.0000 meq | EXTENDED_RELEASE_TABLET | Freq: Two times a day (BID) | ORAL | Status: AC
  Administered 2018-01-16 (×2): 20 meq via ORAL
  Filled 2018-01-16 (×2): qty 1

## 2018-01-16 MED ORDER — FUROSEMIDE 10 MG/ML IJ SOLN
40.0000 mg | Freq: Two times a day (BID) | INTRAMUSCULAR | Status: DC
  Administered 2018-01-16 – 2018-01-17 (×3): 40 mg via INTRAVENOUS
  Filled 2018-01-16 (×4): qty 4

## 2018-01-16 NOTE — Plan of Care (Signed)
  Acute Rehab PT Goals(only PT should resolve) Pt Will Go Supine/Side To Sit 01/16/2018 1355 - Progressing by Ocie BobWatkins, Mckinley Olheiser, PT Flowsheets Taken 01/16/2018 1355  Pt will go Supine/Side to Sit with supervision Patient Will Transfer Sit To/From Stand 01/16/2018 1355 - Progressing by Ocie BobWatkins, Raianna Slight, PT Flowsheets Taken 01/16/2018 1355  Patient will transfer sit to/from stand with supervision Pt Will Transfer Bed To Chair/Chair To Bed 01/16/2018 1355 - Progressing by Ocie BobWatkins, Meridith Romick, PT Flowsheets Taken 01/16/2018 1355  Pt will Transfer Bed to Chair/Chair to Bed with supervision Pt Will Ambulate 01/16/2018 1355 - Progressing by Ocie BobWatkins, Marianne Golightly, PT Flowsheets Taken 01/16/2018 1355  Pt will Ambulate 100 feet;with supervision;with rolling walker  1:56 PM, 01/16/18 Ocie BobJames Malee Grays, MPT Physical Therapist [facility] [phone] office [phone] mobile phone

## 2018-01-16 NOTE — Progress Notes (Signed)
Physical Therapy Evaluation Patient Details Name: Katherine Tyler MRN: [id number] DOB: [birth year] Today's Date: 01/16/2018   History of Present Illness  Katherine Tyler  is a 84 y.o. female, w hypertension, hyperlipidemia, obeisity, gerd, apparently c/o dyspnea for the past week, intermittent cough w white sputum.  Pt notes slight orthopnea. + weight gain and lower ext edema. Pt denies fever, chills, cp, palp, n/v, diarrhea, brbpr.   Pt presented to pcp earlier today and pox 88% on RA and sent to ER for evaluation.     Clinical Impression  Patient functioning near baseline for functional mobility and gait, demonstrates labored movement for sitting up at bedside and limited for gait secondary to c/o fatigue.  Patient will benefit from continued physical therapy in hospital and recommended venue below to increase strength, balance, endurance for safe ADLs and gait.    Follow Up Recommendations Home health PT;Supervision for mobility/OOB    Equipment Recommendations  None recommended by PT    Recommendations for Other Services       Precautions / Restrictions Precautions Precautions: Fall Restrictions Weight Bearing Restrictions: No      Mobility  Bed Mobility Overal bed mobility: Needs Assistance Bed Mobility: Supine to Sit     Supine to sit: Min guard     General bed mobility comments: head of bed raise and use of siderail  Transfers Overall transfer level: Needs assistance Equipment used: Rolling walker (2 wheeled) Transfers: Sit to/from [employer] Sit to Stand: Min guard Stand pivot transfers: Min guard          Ambulation/Gait Ambulation/Gait assistance: Min guard Ambulation Distance (Feet): 65 Feet Assistive device: Rolling walker (2 wheeled) Gait Pattern/deviations: Decreased step length - right;Decreased step length - left;Decreased stride length   Gait velocity interpretation: Below normal speed for age/gender General Gait Details: slightly labored  slow cadence without loss of balance, limited secondary to c/o fatigue  Stairs            Wheelchair Mobility    Modified Rankin (Stroke Patients Only)       Balance Overall balance assessment: Needs assistance Sitting-balance support: No upper extremity supported;Feet supported Sitting balance-Leahy Scale: Good     Standing balance support: Bilateral upper extremity supported;During functional activity Standing balance-Leahy Scale: Fair                               Pertinent Vitals/Pain Pain Assessment: No/denies pain    Home Living Family/patient expects to be discharged to:: Private residence Living Arrangements: Alone Available Help at Discharge: Family;Available PRN/intermittently Type of Home: House Home Access: Stairs to enter Entrance Stairs-Rails: None Entrance Stairs-Number of Steps: 1 Home Layout: Two level Home Equipment: Walker - 2 wheels;Shower seat;Bedside commode      Prior Function Level of Independence: Independent with assistive device(s)         Comments: Ambulates with RW     Hand Dominance        Extremity/Trunk Assessment   Upper Extremity Assessment Upper Extremity Assessment: Defer to OT evaluation    Lower Extremity Assessment Lower Extremity Assessment: Generalized weakness    Cervical / Trunk Assessment Cervical / Trunk Assessment: Normal  Communication   Communication: No difficulties  Cognition Arousal/Alertness: Awake/alert Behavior During Therapy: WFL for tasks assessed/performed Overall Cognitive Status: Within Functional Limits for tasks assessed  General Comments      Exercises     Assessment/Plan    PT Assessment Patient needs continued PT services  PT Problem List Decreased strength;Decreased activity tolerance;Decreased balance;Decreased mobility       PT Treatment Interventions Gait training;Functional mobility  training;Therapeutic exercise;Patient/family education;Therapeutic activities;Stair training    PT Goals (Current goals can be found in the Care Plan section)  Acute Rehab PT Goals Patient Stated Goal: return home with family to assist PT Goal Formulation: With patient/family Time For Goal Achievement: 01/23/18 Potential to Achieve Goals: Good    Frequency Min 3X/week   Barriers to discharge        Co-evaluation               AM-PAC PT "6 Clicks" Daily Activity  Outcome Measure Difficulty turning over in bed (including adjusting bedclothes, sheets and blankets)?: None Difficulty moving from lying on back to sitting on the side of the bed? : A Little Difficulty sitting down on and standing up from a chair with arms (e.g., wheelchair, bedside commode, etc,.)?: A Little Help needed moving to and from a bed to chair (including a wheelchair)?: A Little Help needed walking in hospital room?: A Little Help needed climbing 3-5 steps with a railing? : A Little 6 Click Score: 19    End of Session Equipment Utilized During Treatment: Gait belt Activity Tolerance: Patient tolerated treatment well;Patient limited by fatigue Patient left: in chair;with call bell/phone within reach;with family/visitor present Nurse Communication: Mobility status PT Visit Diagnosis: Unsteadiness on feet (R26.81);Other abnormalities of gait and mobility (R26.89);Muscle weakness (generalized) (M62.81)    Time: [id number] PT Time Calculation (min) (ACUTE ONLY): 27 min   Charges:   PT Evaluation $PT Eval Moderate Complexity: 1 Mod PT Treatments $Therapeutic Activity: 23-37 mins   PT G Codes:        1:53 PM, 01/16/18 Ocie BobJames Nael Petrosyan, MPT Physical Therapist [facility] 336 [phone] office [phone] mobile phone

## 2018-01-16 NOTE — Progress Notes (Signed)
EKG ordered for pt confirms transition to afib rhythm, MD made aware.

## 2018-01-16 NOTE — Progress Notes (Signed)
OT Cancellation Note  Patient Details Name: Gaston IslamMary Norgard MRN: [id number] DOB: 07/19/1929   Cancelled Treatment:    Reason Eval/Treat Not Completed: Patient at procedure or test/ unavailable. Pt just received breakfast on OT arrival and requested to finish before evaluation. Will check back at a later time.   Ezra SitesLeslie Sandia Pfund, OTR/L  [phone] 01/16/2018, 9:09 AM

## 2018-01-16 NOTE — Consult Note (Signed)
Cardiology Consultation:   Patient ID: Keyia Moretto; [id number]; 03-03-1929   Admit date: 01/14/2018 Date of Consult: 01/16/2018  Primary Care Provider: Junie Spencer, FNP Consulting Cardiologist: Dr. Jonelle Sidle   Patient Profile:   Mayrin Schmuck is a 84 y.o. female with a history of hypertension, hyperlipidemia, and hypothyroidism who is being seen today for the evaluation of newly documented atrial fibrillation at the request of Dr. Flossie Dibble.  History of Present Illness:   Ms. Dottavio states that over the last 3 weeks she has been experiencing intermittent wheezing and chest congestion, nonproductive cough, pleuritic costal discomfort, and also leg swelling.  She was seen by her PCP on February 5 and referred to the ER, subsequently admitted to the hospitalist service for treatment of suspected diastolic heart failure.  Under hospital observation on telemetry she went into atrial fibrillation.  She is not aware of any prior cardiac arrhythmias, has not experienced palpitations recently, including at this time.  Echocardiogram obtained on February 6 revealed mild LVH with LVEF 60-65%, grade 2 diastolic dysfunction, moderate mitral regurgitation and tricuspid regurgitation, and moderately elevated pulmonary artery systolic pressure of 52 mmHg.  CHADSVASC score is 4. She has no history of ischemic heart disease.  Past Medical History:  Diagnosis Date  . Anxiety   . Arthritis   . GERD (gastroesophageal reflux disease)   . Hiatal hernia   . Hyperlipidemia   . Hypertension   . Hypothyroidism   . Obesity   . Vitamin D deficiency     Past Surgical History:  Procedure Laterality Date  . CHOLECYSTECTOMY    . TUBAL LIGATION  1960     Inpatient Medications: Scheduled Meds: . aspirin EC  81 mg Oral Daily  . cholecalciferol  1,000 Units Oral Daily  . citalopram  20 mg Oral Daily  . diltiazem  180 mg Oral Daily  . enoxaparin (LOVENOX) injection  40 mg Subcutaneous Q24H  . ferrous  sulfate  325 mg Oral BID  . furosemide  40 mg Intravenous BID  . levothyroxine  112 mcg Oral QAC breakfast  . losartan  100 mg Oral Daily  . magnesium oxide  400 mg Oral Daily  . metoprolol succinate  100 mg Oral Daily  . nitroGLYCERIN  1 inch Topical Q8H  . potassium chloride  20 mEq Oral BID  . sodium chloride flush  3 mL Intravenous Q12H  . thiamine  100 mg Oral Daily   Continuous Infusions: . sodium chloride     PRN Meds: sodium chloride, acetaminophen **OR** acetaminophen, albuterol, sodium chloride flush  Allergies:    Allergies  Allergen Reactions  . Terazosin Hcl Other (See Comments)    Muscle aches  . Keflex [Cephalexin] Other (See Comments)    unknown    Social History:   Social History   Socioeconomic History  . Marital status: Widowed    Spouse name: Not on file  . Number of children: Not on file  . Years of education: Not on file  . Highest education level: Not on file  Social Needs  . Financial resource strain: Not on file  . Food insecurity - worry: Not on file  . Food insecurity - inability: Not on file  . Transportation needs - medical: Not on file  . Transportation needs - non-medical: Not on file  Occupational History  . Not on file  Tobacco Use  . Smoking status: Never Smoker  . Smokeless tobacco: Former Neurosurgeon    Types: Snuff  Substance  and Sexual Activity  . Alcohol use: No  . Drug use: No  . Sexual activity: No  Other Topics Concern  . Not on file  Social History Narrative  . Not on file    Family History:   The patient's family history includes Diabetes in her daughter; Heart disease in her brother, brother, brother, mother, sister, sister, and sister; Stroke in her father.  ROS:  Please see the history of present illness.  Hearing loss.  All other ROS reviewed and negative.     Physical Exam/Data:   Vitals:   01/15/18 2218 01/15/18 2313 01/16/18 0023 01/16/18 0600  BP:  (!) 154/55  (!) 186/72  Pulse:  85  88  Resp:    18    Temp:    98 F (36.7 C)  TempSrc:    Oral  SpO2: 93%  91% 95%  Weight:    234 lb 5.6 oz (106.3 kg)  Height:        Intake/Output Summary (Last 24 hours) at 01/16/2018 0808 Last data filed at 01/16/2018 0600 Gross per 24 hour  Intake 6 ml  Output 3150 ml  Net -3144 ml   Filed Weights   01/15/18 0417 01/16/18 0600  Weight: 238 lb 12.1 oz (108.3 kg) 234 lb 5.6 oz (106.3 kg)   Body mass index is 42.86 kg/m.   Gen: Obese elderly woman, no distress. HEENT: Conjunctiva and lids normal, oropharynx clear. Neck: Supple, no elevated JVP or carotid bruits, no thyromegaly. Lungs:   Coarse breath sounds with expiratory wheezes and rhonchi, nonlabored breathing at rest. Cardiac: Irregularly irregular, no S3, 2/6 systolic murmur. Abdomen: Soft, nontender, bowel sounds present. Extremities: Increased adipose tissue, mild ankle edema, distal pulses 1-2+. Skin: Warm and dry. Musculoskeletal: No kyphosis. Neuropsychiatric: Alert and oriented x3, affect grossly appropriate.  EKG:  I personally reviewed the tracing from 01/16/2018 which showed coarse atrial fibrillation/flutter with controlled ventricular response and right bundle branch block.  Telemetry:  I personally reviewed telemetry which shows atrial fibrillation.  Relevant CV Studies:  Echocardiogram 01/15/2018: Study Conclusions  - Left ventricle: The cavity size was normal. Wall thickness was   increased in a pattern of mild LVH. Systolic function was normal.   The estimated ejection fraction was in the range of 60% to 65%.   Wall motion was normal; there were no regional wall motion   abnormalities. Features are consistent with a pseudonormal left   ventricular filling pattern, with concomitant abnormal relaxation   and increased filling pressure (grade 2 diastolic dysfunction).   Doppler parameters are consistent with high ventricular filling   pressure. - Mitral valve: Calcified annulus. There was moderate   regurgitation. -  Left atrium: The atrium was mildly dilated. - Tricuspid valve: There was moderate regurgitation. - Pulmonary arteries: PA peak pressure: 52 mm Hg (S).  Laboratory Data:  Chemistry Recent Labs  Lab 01/14/18 1700 01/15/18 0108 01/16/18 0441  NA 121* 121* 125*  K 5.2* 4.2 3.4*  CL 85* 84* 85*  CO2 25 28 29   GLUCOSE 123* 111* 94  BUN 29* 26* 19  CREATININE 1.12* 1.13* 0.81  CALCIUM 9.6 9.1 9.0  GFRNONAA 43* 43* >60  GFRAA 50* 50* >60  ANIONGAP 11 9 11     Recent Labs  Lab 01/15/18 0108  PROT 6.6  ALBUMIN 3.6  AST 36  ALT 35  ALKPHOS 63  BILITOT 0.7   Hematology Recent Labs  Lab 01/14/18 1700 01/15/18 0108 01/16/18 0441  WBC 7.8 7.1 6.0  RBC 2.95* 2.77* 3.07*  HGB 9.6* 9.1* 9.9*  HCT 28.9* 27.3* 30.1*  MCV 98.0 98.6 98.0  MCH 32.5 32.9 32.2  MCHC 33.2 33.3 32.9  RDW 13.6 13.7 13.5  PLT 146* 133* 163   Cardiac Enzymes Recent Labs  Lab 01/14/18 1700 01/14/18 1935 01/15/18 0108 01/15/18 0725  TROPONINI 0.03* 0.03* 0.03* <0.03   No results for input(s): TROPIPOC in the last 168 hours.  BNP Recent Labs  Lab 01/14/18 1701 01/16/18 0441  BNP 1,398.0* 804.0*    Radiology/Studies:  Dg Chest 2 View  Result Date: 01/15/2018 CLINICAL DATA:  Hypertension obesity, dyspnea EXAM: CHEST  2 VIEW COMPARISON:  01/14/2018 FINDINGS: Mild cardiomegaly with central vascular congestion. No focal pulmonary infiltrate or effusion. Aortic atherosclerosis. No pneumothorax. Surgical clips in the right upper quadrant. IMPRESSION: Cardiomegaly with central vascular congestion. Negative for overt edema or focal pulmonary infiltrate. Electronically Signed   By: Jasmine Pang M.D.   On: 01/15/2018 01:40   Dg Chest Port 1 View  Result Date: 01/14/2018 CLINICAL DATA:  weakness and sob, non productive cough today. 02 was in 80s and hr in 40s. Pt has audible wheezing. Normal hr is high 50 and low 60s. History of HTN, Hiatal hernia. EXAM: PORTABLE CHEST 1 VIEW COMPARISON:  07/07/2009  FINDINGS: Lung volumes are low. There is bilateral vascular congestion with central interstitial thickening mild hazy central airspace opacity, representing a change from prior study. No focal areas of lung consolidation. No convincing pleural effusion.  No pneumothorax. Cardiac silhouette is borderline enlarged. No mediastinal or hilar masses. Skeletal structures are demineralized but grossly intact. IMPRESSION: 1. Central vascular congestion, interstitial thickening and mild central hazy ground-glass opacities, all of which are accentuated by low lung volumes. Findings most suggestive of mild congestive heart failure. Multifocal infection is possible. Electronically Signed   By: Amie Portland M.D.   On: 01/14/2018 17:09    Assessment and Plan:   1.  Newly documented atrial fibrillation with CHADSVASC score of 4.  Patient denies any sense of palpitations or chest pain.  She presented with evidence of volume overload and also wheezing with nonproductive cough.  She could be having paroxysmal atrial fibrillation precipitating symptoms, although not experiencing palpitations specifically.  Alternatively, this could be a new transient arrhythmia in the setting of possible URI.  2.  Essential hypertension.  Patient is on Cardizem CD, clonidine, Cozaar, and Toprol-XL as an outpatient.  3.  Hypothyroidism, on Synthroid.  TSH 2.77.  4.  Anemia, hemoglobin 9.9.  Etiology unclear, hemoglobin was previously 12.8 as of 2017.  History of GERD but no definite GI bleed based on available information.  5.  Hyponatremia, improving.  Possibly associated with fluid overload.  Continue with current regimen including Cardizem CD, Toprol-XL, Cozaar, and IV Lasix with potassium supplementation.  She would be a reasonable candidate for anticoagulation to reduce stroke risk, although I do not have a good sense for her anemia and whether there have been any other bleeding issues based on available information.  If no  definitive bleeding problems and stools are guaiac negative, could consider switching from aspirin to Eliquis 5 mg twice daily.  Since she went from sinus rhythm to atrial fibrillation during this hospital stay, also plan to give her a dose of flecainide to see if we can get her converted.  Holding off amiodarone in light of wheezing and possibility that she might have bronchitis/URI although no infiltrates by chest x-ray.  Follow-up BMET and CBC in a.m.  Signed, Nona Dell, MD  01/16/2018 8:08 AM

## 2018-01-17 DIAGNOSIS — I5031 Acute diastolic (congestive) heart failure: Secondary | ICD-10-CM

## 2018-01-17 DIAGNOSIS — I4891 Unspecified atrial fibrillation: Secondary | ICD-10-CM

## 2018-01-17 LAB — BASIC METABOLIC PANEL
Anion gap: 11 (ref 5–15)
BUN: 16 mg/dL (ref 6–20)
CO2: 30 mmol/L (ref 22–32)
CREATININE: 0.89 mg/dL (ref 0.44–1.00)
Calcium: 9.1 mg/dL (ref 8.9–10.3)
Chloride: 82 mmol/L — ABNORMAL LOW (ref 101–111)
GFR calc non Af Amer: 57 mL/min — ABNORMAL LOW (ref >60)
Glucose, Bld: 106 mg/dL — ABNORMAL HIGH (ref 65–99)
Potassium: 3 mmol/L — ABNORMAL LOW (ref 3.5–5.1)
SODIUM: 123 mmol/L — AB (ref 135–145)

## 2018-01-17 LAB — CBC
HCT: 32.5 % — ABNORMAL LOW (ref 36.0–46.0)
Hemoglobin: 11 g/dL — ABNORMAL LOW (ref 12.0–15.0)
MCH: 32.5 pg (ref 26.0–34.0)
MCHC: 33.8 g/dL (ref 30.0–36.0)
MCV: 96.2 fL (ref 78.0–100.0)
PLATELETS: 163 10*3/uL (ref 150–400)
RBC: 3.38 MIL/uL — AB (ref 3.87–5.11)
RDW: 13.2 % (ref 11.5–15.5)
WBC: 4.8 10*3/uL (ref 4.0–10.5)

## 2018-01-17 LAB — BRAIN NATRIURETIC PEPTIDE: B Natriuretic Peptide: 529 pg/mL — ABNORMAL HIGH (ref 0.0–100.0)

## 2018-01-17 MED ORDER — POTASSIUM CHLORIDE CRYS ER 20 MEQ PO TBCR
40.0000 meq | EXTENDED_RELEASE_TABLET | Freq: Once | ORAL | Status: AC
  Administered 2018-01-17: 40 meq via ORAL
  Filled 2018-01-17: qty 2

## 2018-01-17 MED ORDER — APIXABAN 5 MG PO TABS
5.0000 mg | ORAL_TABLET | Freq: Two times a day (BID) | ORAL | Status: DC
  Administered 2018-01-17 – 2018-01-18 (×3): 5 mg via ORAL
  Filled 2018-01-17 (×3): qty 1

## 2018-01-17 MED ORDER — FUROSEMIDE 40 MG PO TABS
40.0000 mg | ORAL_TABLET | Freq: Every day | ORAL | Status: DC
  Administered 2018-01-17 – 2018-01-18 (×2): 40 mg via ORAL
  Filled 2018-01-17 (×2): qty 1

## 2018-01-17 NOTE — Care Management Note (Addendum)
Case Management Note  Patient Details  Name: Katherine IslamMary Tyler MRN: [id number] Date of Birth: 09/17/1929  Subjective/Objective: Adm with CHF. From home. Patient lives at home alone, has frequesnt checks from her 5 children. She has walker and cane. Has had [facility] in the past. [facility] Would like [facility] again.  Will DC home on [place]. Gave patient  30 day free card with explanation.   Benefits check results are:   RE: benefits check  Received: Today  Message Contents  Milana HuntsmanGreenlee, [name]  [name], RN        #  3.  SECONDARY INS : MEDICAID [place] ACCESS       EFF-DATE : 09-09-2017       CO-PAY- $ 3.70 FOR EACH RX            PRIMARY INS: [employer] [facility] [employer] [facility]             Action/Plan: DC home with home health. Will need home health orders.    Expected Discharge Date:     01/18/2018             Expected Discharge Plan:  Home w Home Health Services  In-House Referral:     Discharge planning Services  CM Consult, Medication Assistance  Post Acute Care Choice:  Home Health Choice offered to:  Patient  DME Arranged:    DME Agency:     HH Arranged:  PT HH Agency:  [facility]  Status of Service:  Completed, signed off  If discussed at Long Length of Stay Meetings, dates discussed:    Additional Comments:  [name], Chrystine OilerSharley Diane, RN 01/17/2018, 12:52 PM

## 2018-01-17 NOTE — Progress Notes (Signed)
Progress Note  Patient Name: Katherine IslamMary Tyler Date of Encounter: 01/17/2018  Primary Cardiologist: Dr. Diona BrownerMcDowell  Subjective   Reports breathing is close to baseline and edema has significantly improved. No chest pain, palpitations, or dizziness.   Inpatient Medications    Scheduled Meds: . aspirin EC  81 mg Oral Daily  . cholecalciferol  1,000 Units Oral Daily  . citalopram  20 mg Oral Daily  . diltiazem  180 mg Oral Daily  . enoxaparin (LOVENOX) injection  40 mg Subcutaneous Q24H  . ferrous sulfate  325 mg Oral BID  . furosemide  40 mg Intravenous BID  . levothyroxine  112 mcg Oral QAC breakfast  . losartan  100 mg Oral Daily  . magnesium oxide  400 mg Oral Daily  . metoprolol succinate  100 mg Oral Daily  . nitroGLYCERIN  1 inch Topical Q8H  . sodium chloride flush  3 mL Intravenous Q12H  . thiamine  100 mg Oral Daily   Continuous Infusions: . sodium chloride     PRN Meds: sodium chloride, acetaminophen **OR** acetaminophen, albuterol, sodium chloride flush   Vital Signs    Vitals:   01/16/18 1900 01/16/18 2000 01/17/18 0300 01/17/18 0500  BP:  (!) 178/82  (!) 160/80  Pulse: 69  76   Resp: 18  18   Temp: 98 F (36.7 C)  98 F (36.7 C)   TempSrc: Oral  Oral   SpO2: 97%     Weight:   225 lb 8.5 oz (102.3 kg)   Height:        Intake/Output Summary (Last 24 hours) at 01/17/2018 0908 Last data filed at 01/17/2018 0600 Gross per 24 hour  Intake 960 ml  Output 3150 ml  Net -2190 ml   Filed Weights   01/15/18 0417 01/16/18 0600 01/17/18 0300  Weight: 238 lb 12.1 oz (108.3 kg) 234 lb 5.6 oz (106.3 kg) 225 lb 8.5 oz (102.3 kg)    Telemetry    Initially bradycardiac with HR in the 50's following cardioversion with Flecainide. Has maintained NSR since with HR in the 60's to 70's.  - Personally Reviewed  ECG    NSR, HR 75, with 1st degree AV block.  - Personally Reviewed  Physical Exam   General: Well developed, well nourished, obese Caucasian female appearing in  no acute distress. Head: Normocephalic, atraumatic.  Neck: Supple without bruits, JVD not elevated. Lungs:  Resp regular and unlabored, mild rales along bases bilaterally. Heart: RRR, S1, S2, no S3, S4, no rub. 2/6 SEM along Apex Abdomen: Soft, non-tender, non-distended with normoactive bowel sounds. No hepatomegaly. No rebound/guarding. No obvious abdominal masses. Extremities: No clubbing or cyanosis, trace lower extremity edema. Distal pedal pulses are 2+ bilaterally. Varicose veins present.  Neuro: Alert and oriented X 3. Moves all extremities spontaneously. Psych: Normal affect.  Labs    Chemistry Recent Labs  Lab 01/15/18 0108 01/16/18 0441 01/17/18 0647  NA 121* 125* 123*  K 4.2 3.4* 3.0*  CL 84* 85* 82*  CO2 28 29 30   GLUCOSE 111* 94 106*  BUN 26* 19 16  CREATININE 1.13* 0.81 0.89  CALCIUM 9.1 9.0 9.1  PROT 6.6  --   --   ALBUMIN 3.6  --   --   AST 36  --   --   ALT 35  --   --   ALKPHOS 63  --   --   BILITOT 0.7  --   --   GFRNONAA 43* >60 57*  GFRAA 50* >60 >60  ANIONGAP 9 11 11      Hematology Recent Labs  Lab 01/15/18 0108 01/16/18 0441 01/17/18 0647  WBC 7.1 6.0 4.8  RBC 2.77* 3.07* 3.38*  HGB 9.1* 9.9* 11.0*  HCT 27.3* 30.1* 32.5*  MCV 98.6 98.0 96.2  MCH 32.9 32.2 32.5  MCHC 33.3 32.9 33.8  RDW 13.7 13.5 13.2  PLT 133* 163 163    Cardiac Enzymes Recent Labs  Lab 01/14/18 1700 01/14/18 1935 01/15/18 0108 01/15/18 0725  TROPONINI 0.03* 0.03* 0.03* <0.03   No results for input(s): TROPIPOC in the last 168 hours.   BNP Recent Labs  Lab 01/14/18 1701 01/16/18 0441 01/17/18 0647  BNP 1,398.0* 804.0* 529.0*     DDimer No results for input(s): DDIMER in the last 168 hours.   Radiology    No results found.  Cardiac Studies   Echocardiogram: 01/15/2018 Study Conclusions  - Left ventricle: The cavity size was normal. Wall thickness was   increased in a pattern of mild LVH. Systolic function was normal.   The estimated ejection  fraction was in the range of 60% to 65%.   Wall motion was normal; there were no regional wall motion   abnormalities. Features are consistent with a pseudonormal left   ventricular filling pattern, with concomitant abnormal relaxation   and increased filling pressure (grade 2 diastolic dysfunction).   Doppler parameters are consistent with high ventricular filling   pressure. - Mitral valve: Calcified annulus. There was moderate   regurgitation. - Left atrium: The atrium was mildly dilated. - Tricuspid valve: There was moderate regurgitation. - Pulmonary arteries: PA peak pressure: 52 mm Hg (S).   Patient Profile     84 y.o. female w/ PMH of HTN, HLD, and Hypothyroidism who presented for worsening wheezing and congestion over the past few weeks. Admitted for diastolic CHF and URI, found to have new-onset atrial fibrillation.   Assessment & Plan    1. Newly documented atrial fibrillation - was initially in NSR upon admission but developed atrial fibrillation and received PO Flecainide on 01/16/2018 with conversion to NSR. Has been maintaining NSR since with HR in the 70's to 80's. She will continue on Toprol-XL 100mg  daily and Cardizem CD 180mg  daily.  - This patients CHA2DS2-VASc Score and unadjusted Ischemic Stroke Rate (% per year) is equal to 4.8 % stroke rate/year from a score of 4 (HTN, Female, Age (2)). Reviewed the indication for anticoagulation with the patient and her daughter. She denies any prior episodes of significant bleeding and no active bleeding at this time. Will therefore start Eliquis 5mg  BID. Stop ASA. Will request benefits check with Care Management. Recommended repeat CBC in the outpatient setting for continual monitoring of her Hgb.   2. Acute Diastolic CHF - BNP elevated to 1398 on admission with CXR showing vascular congestion. She has been diuresed with IV Lasix and BNP is trending down to 529 today. Overall net output of -6.5L thus far (-2.1L yesterday). Received  IV Lasix this morning. Would anticipate transitioning to PO Lasix 40mg  daily tomorrow with K-dur 20 mEq dosing. Will need a repeat BMET in 2 weeks.   3. Essential hypertension - BP has been elevated this admission. Reports this is usually well-controlled when checked at home.  - continue current medication regimen which can be further titrated in the outpatient setting.   4. Hypothyroidism - TSH at 2.773. Continue PTA Synthroid.  5. Anemia - she denies any evidence of active bleeding. Occult blood pending.  Hgb improved to 11.0 this morning. With initiation of anticoagulation, will need to monitor closely.   6. Hypokalemia/ Hyponatremia - Na+ 123 (possibly exacerbated by fluid accumulation) with K+ at 3.0. Will replace Potassium. Will need to be on a standing dose of K-dur at the time of discharge due to initiation of Lasix.     Will arrange for outpatient Cardiology follow-up.    For questions or updates, please contact [facility] Please consult [email or web address] for contact info under Cardiology/STEMI.   Lorri Frederick , PA-C 9:08 AM 01/17/2018 Pager: [phone]   Attending note:  Patient seen and examined.  Reviewed interval hospital course and discussed the case with Ms. Iran Ouch PA-C.  Patient ultimately converted back to sinus rhythm following dose of flecainide and remains in sinus rhythm today.  CHADSVASC score is 4, following discussion regarding risks and benefits including bleeding complications, Eliquis will be initiated with discontinuation of aspirin.  Otherwise plan to continue Cardizem CD and Toprol-XL.  Agree with transition to oral Lasix.  She will need to have close outpatient follow-up as discussed above.  Jonelle Sidle, M.D., F.A.C.C.

## 2018-01-17 NOTE — Plan of Care (Signed)
Patient ambulated to bathroom with walker.  Tolerated well.  No c/o shortness of breath.  Patient returned to chair.  Family at bedside.

## 2018-01-17 NOTE — Progress Notes (Signed)
PROGRESS NOTE    Katherine Tyler  ZOX:[id number] DOB: 1929/01/01 DOA: 01/14/2018 PCP: Junie Spencer, FNP     Brief Narrative:  84 y/o woman admitted from home on 2/5 with SOB. Found to have acute diastolic CHF exacerbation as well as a fib with RVR. Admission is requested.   Assessment & Plan:   Principal Problem:   CHF (congestive heart failure) (HCC) Active Problems:   Hypothyroidism   Hyponatremia   Essential hypertension   Iron deficiency anemia   Hyperkalemia   A-fib (HCC)   Acute on Chronic Diastolic CHF -ECHO reviewed: EF 60-65%, grade 2 DD and no WMA. -Is an impressive 7.6 L negative since admission. -She is no longer SOB and clinically appears euvolemic. -Will transition lasix to PO dosing today and observe another 24 hours. -Continue BB, ARB.  A Fib with RVR -Converted back to NSR. -Continue toprol XL and cardizem for rate control. -Has been started on Eliquis for anticoagulation purposes.  Essential Hypertension -Fair control. -Do not anticipate further medication changes while in the hospital.  Hypothyroidism -TSH ok at 2.773. -Continue current synthroid dose.  Hypokalemia -Continue to replace orally. -Due to diuresis. -Mag ok at 1.8.  Hyponatremia -Improving, suspect due to chronic hypervolemic state from CHF.   DVT prophylaxis: Eliquis Code Status: Full Code Family Communication: Patient only Disposition Plan: Home likely in 24-48 hours.  Consultants:   Cardiology  Procedures:   None  Antimicrobials:  Anti-infectives (From admission, onward)   None       Subjective: Feels much better. Less SOB. Still with significant incontinence due to lasix use.  Objective: Vitals:   01/17/18 0500 01/17/18 0916 01/17/18 1300 01/17/18 2059  BP: (!) 160/80  (!) 152/64 (!) 148/68  Pulse:  67 (!) 54   Resp:      Temp:   97.9 F (36.6 C)   TempSrc:   Oral   SpO2:   97%   Weight:      Height:        Intake/Output Summary (Last 24 hours)  at 01/17/2018 2207 Last data filed at 01/17/2018 2000 Gross per 24 hour  Intake 480 ml  Output 3200 ml  Net -2720 ml   Filed Weights   01/15/18 0417 01/16/18 0600 01/17/18 0300  Weight: 108.3 kg (238 lb 12.1 oz) 106.3 kg (234 lb 5.6 oz) 102.3 kg (225 lb 8.5 oz)    Examination:  General exam: Alert, awake, oriented x 3 Respiratory system: Clear to auscultation. Respiratory effort normal. Cardiovascular system:RRR. No murmurs, rubs, gallops. Gastrointestinal system: Abdomen is nondistended, soft and nontender. No organomegaly or masses felt. Normal bowel sounds heard. Central nervous system: Alert and oriented. No focal neurological deficits. Extremities: No C/C/E, +pedal pulses Skin: No rashes, lesions or ulcers Psychiatry: Judgement and insight appear normal. Mood & affect appropriate.     Data Reviewed: I have personally reviewed following labs and imaging studies  CBC: Recent Labs  Lab 01/14/18 1700 01/15/18 0108 01/16/18 0441 01/17/18 0647  WBC 7.8 7.1 6.0 4.8  NEUTROABS 6.7  --   --   --   HGB 9.6* 9.1* 9.9* 11.0*  HCT 28.9* 27.3* 30.1* 32.5*  MCV 98.0 98.6 98.0 96.2  PLT 146* 133* 163 163   Basic Metabolic Panel: Recent Labs  Lab 01/14/18 1700 01/15/18 0108 01/16/18 0441 01/17/18 0647  NA 121* 121* 125* 123*  K 5.2* 4.2 3.4* 3.0*  CL 85* 84* 85* 82*  CO2 25 28 29 30   GLUCOSE 123* 111* 94  106*  BUN 29* 26* 19 16  CREATININE 1.12* 1.13* 0.81 0.89  CALCIUM 9.6 9.1 9.0 9.1  MG  --   --  1.8  --    GFR: Estimated Creatinine Clearance: 50.9 mL/min (by C-G formula based on SCr of 0.89 mg/dL). Liver Function Tests: Recent Labs  Lab 01/15/18 0108  AST 36  ALT 35  ALKPHOS 63  BILITOT 0.7  PROT 6.6  ALBUMIN 3.6   No results for input(s): LIPASE, AMYLASE in the last 168 hours. No results for input(s): AMMONIA in the last 168 hours. Coagulation Profile: No results for input(s): INR, PROTIME in the last 168 hours. Cardiac Enzymes: Recent Labs  Lab  01/14/18 1700 01/14/18 1935 01/15/18 0108 01/15/18 0725  TROPONINI 0.03* 0.03* 0.03* <0.03   BNP (last 3 results) No results for input(s): PROBNP in the last 8760 hours. HbA1C: No results for input(s): HGBA1C in the last 72 hours. CBG: Recent Labs  Lab 01/15/18 2149  GLUCAP 98   Lipid Profile: No results for input(s): CHOL, HDL, LDLCALC, TRIG, CHOLHDL, LDLDIRECT in the last 72 hours. Thyroid Function Tests: Recent Labs    01/15/18 0108  TSH 2.773   Anemia Panel: No results for input(s): VITAMINB12, FOLATE, FERRITIN, TIBC, IRON, RETICCTPCT in the last 72 hours. Urine analysis:    Component Value Date/Time   COLORURINE YELLOW 01/14/2018 2040   APPEARANCEUR HAZY (A) 01/14/2018 2040   LABSPEC 1.006 01/14/2018 2040   PHURINE 5.0 01/14/2018 2040   GLUCOSEU NEGATIVE 01/14/2018 2040   HGBUR NEGATIVE 01/14/2018 2040   BILIRUBINUR NEGATIVE 01/14/2018 2040   KETONESUR NEGATIVE 01/14/2018 2040   PROTEINUR 100 (A) 01/14/2018 2040   UROBILINOGEN 1.0 03/08/2008 1015   NITRITE NEGATIVE 01/14/2018 2040   LEUKOCYTESUR TRACE (A) 01/14/2018 2040   Sepsis Labs: @LABRCNTIP (procalcitonin:4,lacticidven:4)  ) Recent Results (from the past 240 hour(s))  MRSA PCR Screening     Status: None   Collection Time: 01/15/18  3:58 AM  Result Value Ref Range Status   MRSA by PCR NEGATIVE NEGATIVE Final    Comment:        The GeneXpert MRSA Assay (FDA approved for NASAL specimens only), is one component of a comprehensive MRSA colonization surveillance program. It is not intended to diagnose MRSA infection nor to guide or monitor treatment for MRSA infections. Performed at [facility], [address] [place], [place] [address]          Radiology Studies: No results found.      Scheduled Meds: . apixaban  5 mg Oral BID  . cholecalciferol  1,000 Units Oral Daily  . citalopram  20 mg Oral Daily  . diltiazem  180 mg Oral Daily  . ferrous sulfate  325 mg Oral BID  .  furosemide  40 mg Oral Daily  . levothyroxine  112 mcg Oral QAC breakfast  . losartan  100 mg Oral Daily  . magnesium oxide  400 mg Oral Daily  . metoprolol succinate  100 mg Oral Daily  . sodium chloride flush  3 mL Intravenous Q12H  . thiamine  100 mg Oral Daily   Continuous Infusions: . sodium chloride       LOS: 3 days    Time spent: 25 minutes. Greater than 50% of this time was spent in direct contact with the patient coordinating care.     Chaya JanEstela Hernandez Acosta, MD [facility] Pager [phone]  If 7PM-7AM, please contact night-coverage [email or web address] Password [email or web address] 01/17/2018, 10:07 PM

## 2018-01-17 NOTE — Progress Notes (Signed)
OT Cancellation Note  Patient Details Name: Katherine IslamMary Tyler MRN: [id number] DOB: 01/25/1929   Cancelled Treatment:    Reason Eval/Treat Not Completed: Patient at procedure or test/ unavailable;Patient declined, no reason specified. Pt beginning breakfast when OT entered, family member present. Spoke with pt and family, pt son is with pt during the day. In the event OT is unable to evaluate pt prior to d/c, recommend HHOT evaluation to determine baseline level of independence and safety at home.   Ezra SitesLeslie Timeka Goette, OTR/L  [phone] 01/17/2018, 8:21 AM

## 2018-01-17 NOTE — Discharge Instructions (Signed)

## 2018-01-17 NOTE — Care Management Important Message (Signed)
Important Message  Patient Details  Name: Gaston IslamMary Whitaker MRN: [id number] Date of Birth: 04/09/1929   Medicare Important Message Given:  Yes    Fraser Busche, Chrystine OilerSharley Diane, RN 01/17/2018, 12:58 PM

## 2018-01-17 NOTE — Progress Notes (Signed)
Physical Therapy Treatment Patient Details Name: Katherine Tyler MRN: [id number] DOB: 1929-10-03 Today's Date: 01/17/2018    History of Present Illness Katherine Tyler  is a 84 y.o. female, w hypertension, hyperlipidemia, obeisity, gerd, apparently c/o dyspnea for the past week, intermittent cough w white sputum.  Pt notes slight orthopnea. + weight gain and lower ext edema. Pt denies fever, chills, cp, palp, n/v, diarrhea, brbpr.   Pt presented to pcp earlier today and pox 88% on RA and sent to ER for evaluation.     PT Comments    Patient demonstrates increased endurance/gait distance when walking without loss of balance, limited secondary to fatigue and less wheezing on exertion noted by patient's family member.  Patient tolerated sitting up in chair after therapy.  Patient will benefit from continued physical therapy in hospital and recommended venue below to increase strength, balance, endurance for safe ADLs and gait.   Follow Up Recommendations  Home health PT;Supervision for mobility/OOB     Equipment Recommendations  None recommended by PT    Recommendations for Other Services       Precautions / Restrictions Precautions Precautions: Fall Restrictions Weight Bearing Restrictions: No    Mobility  Bed Mobility Overal bed mobility: Modified Independent Bed Mobility: Supine to Sit           General bed mobility comments: head of bed raised approimately 30 degrees and use of bed rail  Transfers Overall transfer level: Needs assistance Equipment used: Rolling walker (2 wheeled) Transfers: Sit to/from [employer] Sit to Stand: Supervision Stand pivot transfers: Supervision          Ambulation/Gait Ambulation/Gait assistance: Supervision Ambulation Distance (Feet): 120 Feet Assistive device: Rolling walker (2 wheeled) Gait Pattern/deviations: Decreased step length - right;Decreased step length - left;Decreased stride length   Gait velocity interpretation:  Below normal speed for age/gender General Gait Details: demonstrates slightly labored slow cadence without loss of balance, increased tolerance/endurance for gait distance   Stairs            Wheelchair Mobility    Modified Rankin (Stroke Patients Only)       Balance Overall balance assessment: Needs assistance Sitting-balance support: Feet supported;No upper extremity supported Sitting balance-Leahy Scale: Good     Standing balance support: Bilateral upper extremity supported;During functional activity Standing balance-Leahy Scale: Fair Standing balance comment: fair/good with RW                            Cognition Arousal/Alertness: Awake/alert Behavior During Therapy: WFL for tasks assessed/performed Overall Cognitive Status: Within Functional Limits for tasks assessed                                        Exercises General Exercises - Lower Extremity Long Arc Quad: Seated;AROM;Strengthening;Both;10 reps Hip Flexion/Marching: Seated;AROM;Strengthening;Both;10 reps Toe Raises: Seated;AROM;Strengthening;Both;10 reps Heel Raises: Seated;AROM;Both;10 reps;Strengthening    General Comments        Pertinent Vitals/Pain Pain Assessment: No/denies pain    Home Living                      Prior Function            PT Goals (current goals can now be found in the care plan section) Acute Rehab PT Goals Patient Stated Goal: return home with family to assist PT Goal Formulation: With  patient/family Time For Goal Achievement: 01/23/18 Potential to Achieve Goals: Good Progress towards PT goals: Progressing toward goals    Frequency    Min 3X/week      PT Plan Current plan remains appropriate    Co-evaluation              AM-PAC PT "6 Clicks" Daily Activity  Outcome Measure  Difficulty turning over in bed (including adjusting bedclothes, sheets and blankets)?: None Difficulty moving from lying on back to  sitting on the side of the bed? : None Difficulty sitting down on and standing up from a chair with arms (e.g., wheelchair, bedside commode, etc,.)?: None Help needed moving to and from a bed to chair (including a wheelchair)?: A Little Help needed walking in hospital room?: A Little Help needed climbing 3-5 steps with a railing? : A Little 6 Click Score: 21    End of Session   Activity Tolerance: Patient tolerated treatment well Patient left: in chair;with call bell/phone within reach;with family/visitor present Nurse Communication: Mobility status PT Visit Diagnosis: Unsteadiness on feet (R26.81);Other abnormalities of gait and mobility (R26.89);Muscle weakness (generalized) (M62.81)     Time: [id number] PT Time Calculation (min) (ACUTE ONLY): 25 min  Charges:  $Therapeutic Activity: 23-37 mins                    G Codes:       12:05 PM, 01/17/18 Ocie BobJames Naarah Borgerding, MPT Physical Therapist with [facility] 336 [phone] office [phone] mobile phone

## 2018-01-18 DIAGNOSIS — R0609 Other forms of dyspnea: Secondary | ICD-10-CM

## 2018-01-18 LAB — CBC
HCT: 37.2 % (ref 36.0–46.0)
Hemoglobin: 12.1 g/dL (ref 12.0–15.0)
MCH: 31.8 pg (ref 26.0–34.0)
MCHC: 32.5 g/dL (ref 30.0–36.0)
MCV: 97.9 fL (ref 78.0–100.0)
Platelets: 193 10*3/uL (ref 150–400)
RBC: 3.8 MIL/uL — AB (ref 3.87–5.11)
RDW: 13.3 % (ref 11.5–15.5)
WBC: 7.2 10*3/uL (ref 4.0–10.5)

## 2018-01-18 LAB — BASIC METABOLIC PANEL
ANION GAP: 11 (ref 5–15)
BUN: 18 mg/dL (ref 6–20)
CHLORIDE: 86 mmol/L — AB (ref 101–111)
CO2: 30 mmol/L (ref 22–32)
Calcium: 9.3 mg/dL (ref 8.9–10.3)
Creatinine, Ser: 1.02 mg/dL — ABNORMAL HIGH (ref 0.44–1.00)
GFR, EST AFRICAN AMERICAN: 56 mL/min — AB (ref >60)
GFR, EST NON AFRICAN AMERICAN: 48 mL/min — AB (ref >60)
Glucose, Bld: 94 mg/dL (ref 65–99)
Potassium: 3.8 mmol/L (ref 3.5–5.1)
SODIUM: 127 mmol/L — AB (ref 135–145)

## 2018-01-18 LAB — BRAIN NATRIURETIC PEPTIDE: B Natriuretic Peptide: 445 pg/mL — ABNORMAL HIGH (ref 0.0–100.0)

## 2018-01-18 MED ORDER — FUROSEMIDE 40 MG PO TABS: 40.0000 mg | ORAL_TABLET | Freq: Every day | ORAL | 2 refills | Status: DC

## 2018-01-18 MED ORDER — APIXABAN 5 MG PO TABS: 5.0000 mg | ORAL_TABLET | Freq: Two times a day (BID) | ORAL | 2 refills | Status: DC

## 2018-01-18 NOTE — Progress Notes (Signed)
Pt had large episode of urinary incontinence, urine soaked through two bed pads and onto sheet, I&O's inacurrate.

## 2018-01-18 NOTE — Discharge Summary (Signed)
Physician Discharge Summary  Katherine Tyler ZOX:[id number] DOB: 1929/07/22 DOA: 01/14/2018  PCP: Junie Spencer, FNP  Admit date: 01/14/2018 Discharge date: 01/18/2018  Time spent: 45 minutes  Recommendations for Outpatient Follow-up:  -Will be discharged home today. -Has appointment with cardiology scheduled for 2/22.   Discharge Diagnoses:  Principal Problem:   CHF (congestive heart failure) (HCC) Active Problems:   Hypothyroidism   Hyponatremia   Essential hypertension   Iron deficiency anemia   Hyperkalemia   A-fib [facility])   Discharge Condition: Stable and improved  Filed Weights   01/16/18 0600 01/17/18 0300 01/18/18 0500  Weight: 106.3 kg (234 lb 5.6 oz) 102.3 kg (225 lb 8.5 oz) 101.6 kg (223 lb 15.8 oz)    History of present illness:  As per Dr. Selena Batten on 2/5: Katherine Tyler  is a 84 y.o. female, w hypertension, hyperlipidemia, obeisity, gerd, apparently c/o dyspnea for the past week, intermittent cough w white sputum.  Pt notes slight orthopnea. + weight gain and lower ext edema. Pt denies fever, chills, cp, palp, n/v, diarrhea, brbpr.   Pt presented to pcp earlier today and pox 88% on RA and sent to ER for evaluation.    In Ed,  IMPRESSION: 1. Central vascular congestion, interstitial thickening and mild central hazy ground-glass opacities, all of which are accentuated by low lung volumes. Findings most suggestive of mild congestive heart failure. Multifocal infection is possible.  EKG  Na 121, K 5.2, Bun 29, Creatinine 1.12 Trop 0.03 BNP 1,398 Influenza negative  EKG sinus brady at 50, nl axis, RBBB  Pt will be admitted for CHF, and troponin elevation.      Hospital Course:   Acute on Chronic Diastolic CHF -ECHO reviewed: EF 60-65%, grade 2 DD and no WMA. -Is an impressive 8.6 L negative since admission. -She is no longer SOB and clinically appears euvolemic. -Diuresed well overnight on PO lasix. -Continue BB, ARB.  A Fib with RVR -Converted back  to NSR. -Continue toprol XL and cardizem for rate control. -Has been started on Eliquis for anticoagulation purposes.  Essential Hypertension -Fair control. -Do not anticipate further medication changes while in the hospital.  Hypothyroidism -TSH ok at 2.773. -Continue current synthroid dose.  Hypokalemia -K is 3.8 on DC. -Due to diuresis. -Mag ok at 1.8.  Hyponatremia -Improving, suspect due to chronic hypervolemic state from CHF.     Procedures:  ECHO as above   Consultations:  Cardiology  Discharge Instructions  Discharge Instructions    Diet - low sodium heart healthy   Complete by:  As directed    Increase activity slowly   Complete by:  As directed      Allergies as of 01/18/2018      Reactions   Terazosin Hcl Other (See Comments)   Muscle aches   Keflex [cephalexin] Other (See Comments)   unknown      Medication List    STOP taking these medications   aspirin 81 MG tablet   cloNIDine 0.1 MG tablet Commonly known as:  CATAPRES     TAKE these medications   apixaban 5 MG Tabs tablet Commonly known as:  ELIQUIS Take 1 tablet (5 mg total) by mouth 2 (two) times daily.   citalopram 20 MG tablet Commonly known as:  CELEXA TAKE ONE TABLET BY MOUTH EVERY DAY   D3-1000 1000 units capsule Generic drug:  Cholecalciferol Take 1,000 Units by mouth daily.   diltiazem 180 MG 24 hr capsule Commonly known as:  CARDIZEM  CD Take 1 capsule (180 mg total) by mouth 2 (two) times daily.   furosemide 40 MG tablet Commonly known as:  LASIX Take 1 tablet (40 mg total) by mouth daily. Start taking on:  01/19/2018   levothyroxine 112 MCG tablet Commonly known as:  SYNTHROID Take 1 tablet (112 mcg total) by mouth daily before breakfast.   losartan 100 MG tablet Commonly known as:  COZAAR Take 1 tablet (100 mg total) by mouth daily.   magnesium oxide 400 (241.3 Mg) MG tablet Commonly known as:  MAG-OX Take 1 tablet (400 mg total) by mouth daily.     metoprolol succinate 50 MG 24 hr tablet Commonly known as:  TOPROL-XL TAKE ONE TABLET BY MOUTH EVERY DAY. TAKEWITH OR IMMEDIATELY FOLLOWINGA MEAL.   nystatin powder Commonly known as:  MYCOSTATIN/NYSTOP Apply topically 4 (four) times daily.   SLOW RELEASE IRON 45 MG Tbcr Generic drug:  Ferrous Sulfate Dried Take 45 mg by mouth 2 (two) times daily.   thiamine 100 MG tablet Commonly known as:  VITAMIN B-1 Take 100 mg by mouth daily.      Allergies  Allergen Reactions  . Terazosin Hcl Other (See Comments)    Muscle aches  . Keflex [Cephalexin] Other (See Comments)    unknown   Follow-up Information    Ellsworth Lennox, PA-C Follow up on 01/31/2018.   Specialties:  Physician Assistant, Cardiology Why:  Cardiology Hospital Follow-Up on 01/31/2018 at 3:30PM.  Contact information: [address] [place] [address] [phone]            The results of significant diagnostics from this hospitalization (including imaging, microbiology, ancillary and laboratory) are listed below for reference.    Significant Diagnostic Studies: Dg Chest 2 View  Result Date: 01/15/2018 CLINICAL DATA:  Hypertension obesity, dyspnea EXAM: CHEST  2 VIEW COMPARISON:  01/14/2018 FINDINGS: Mild cardiomegaly with central vascular congestion. No focal pulmonary infiltrate or effusion. Aortic atherosclerosis. No pneumothorax. Surgical clips in the right upper quadrant. IMPRESSION: Cardiomegaly with central vascular congestion. Negative for overt edema or focal pulmonary infiltrate. Electronically Signed   By: Jasmine Pang M.D.   On: 01/15/2018 01:40   Dg Chest Port 1 View  Result Date: 01/14/2018 CLINICAL DATA:  weakness and sob, non productive cough today. 02 was in 80s and hr in 40s. Pt has audible wheezing. Normal hr is high 50 and low 60s. History of HTN, Hiatal hernia. EXAM: PORTABLE CHEST 1 VIEW COMPARISON:  07/07/2009 FINDINGS: Lung volumes are low. There is bilateral vascular congestion with  central interstitial thickening mild hazy central airspace opacity, representing a change from prior study. No focal areas of lung consolidation. No convincing pleural effusion.  No pneumothorax. Cardiac silhouette is borderline enlarged. No mediastinal or hilar masses. Skeletal structures are demineralized but grossly intact. IMPRESSION: 1. Central vascular congestion, interstitial thickening and mild central hazy ground-glass opacities, all of which are accentuated by low lung volumes. Findings most suggestive of mild congestive heart failure. Multifocal infection is possible. Electronically Signed   By: Amie Portland M.D.   On: 01/14/2018 17:09    Microbiology: Recent Results (from the past 240 hour(s))  MRSA PCR Screening     Status: None   Collection Time: 01/15/18  3:58 AM  Result Value Ref Range Status   MRSA by PCR NEGATIVE NEGATIVE Final    Comment:        The GeneXpert MRSA Assay (FDA approved for NASAL specimens only), is one component of a comprehensive MRSA  colonization surveillance program. It is not intended to diagnose MRSA infection nor to guide or monitor treatment for MRSA infections. Performed at [facility], [address] [place], [place] [address]      Labs: Basic Metabolic Panel: Recent Labs  Lab 01/14/18 1700 01/15/18 0108 01/16/18 0441 01/17/18 0647 01/18/18 0740  NA 121* 121* 125* 123* 127*  K 5.2* 4.2 3.4* 3.0* 3.8  CL 85* 84* 85* 82* 86*  CO2 25 28 29 30 30   GLUCOSE 123* 111* 94 106* 94  BUN 29* 26* 19 16 18   CREATININE 1.12* 1.13* 0.81 0.89 1.02*  CALCIUM 9.6 9.1 9.0 9.1 9.3  MG  --   --  1.8  --   --    Liver Function Tests: Recent Labs  Lab 01/15/18 0108  AST 36  ALT 35  ALKPHOS 63  BILITOT 0.7  PROT 6.6  ALBUMIN 3.6   No results for input(s): LIPASE, AMYLASE in the last 168 hours. No results for input(s): AMMONIA in the last 168 hours. CBC: Recent Labs  Lab 01/14/18 1700 01/15/18 0108 01/16/18 0441 01/17/18 0647  01/18/18 0740  WBC 7.8 7.1 6.0 4.8 7.2  NEUTROABS 6.7  --   --   --   --   HGB 9.6* 9.1* 9.9* 11.0* 12.1  HCT 28.9* 27.3* 30.1* 32.5* 37.2  MCV 98.0 98.6 98.0 96.2 97.9  PLT 146* 133* 163 163 193   Cardiac Enzymes: Recent Labs  Lab 01/14/18 1700 01/14/18 1935 01/15/18 0108 01/15/18 0725  TROPONINI 0.03* 0.03* 0.03* <0.03   BNP: BNP (last 3 results) Recent Labs    01/16/18 0441 01/17/18 0647 01/18/18 0740  BNP 804.0* 529.0* 445.0*    ProBNP (last 3 results) No results for input(s): PROBNP in the last 8760 hours.  CBG: Recent Labs  Lab 01/15/18 2149  GLUCAP 98       Signed:  Chaya JanEstela Hernandez Acosta  [facility] Pager: [phone] 01/18/2018, 3:20 PM

## 2018-01-18 NOTE — Progress Notes (Signed)
Patient discharged home.  IV removed - WNL.  Instructed to apply pressure if bleeding occurs as patient is now taking eliquis.  Reviewed AVS and medications with patient and daughter.  Verbalized understanding.  Educated on bleeding precautions.  Follow up appt in place with cardio and PCP.  No questions at this time.  Patient assisted off unit via WC in NAD.

## 2018-01-27 ENCOUNTER — Other Ambulatory Visit: Payer: Self-pay

## 2018-01-27 NOTE — Patient Outreach (Signed)
[facility] Care Management  01/27/2018  Katherine IslamMary Baines 04/30/1929 [id number]   Telephone call to  Daughter [name].  She is able to verify HIPAA.  Advised her that patient advised CM to call her.  Discussed patient EMMI red flag.  Went over who to call for patient problems.  She states that patient gets confused and is hard of hearing and does not do well over the phone.  Daughter states that patient was supposed to have home health but she states that patient said no one has gotten in contact with her. Asked her about patient weighing.  She says that patient is weighing daily and that she is writing weights down. Discussed with daughter weight perimeters and other signs to look for.    [facility].  Daughter states that patient has appointment with heart doctor this week and primary physician this week.  She states that patient gets confused/irritated with different or multiple people.  Advised daughter that I would call [facility] and get back with her.  She is agreeable.    Telephone call to [facility] spoke with Lawanna Kobusngel and they are not showing an order for home health.  In basket message sent to [facility] CM at [facility] about home health orders.    Plan: RN CM will wait response from [facility].    Bary Lericheionne J Clover Feehan, RN, MSN [facility] Care Management Care Management Coordinator Direct Line [phone] Toll Free: [phone]  Fax: [phone]

## 2018-01-27 NOTE — Patient Outreach (Signed)
[facility]) Care Management  01/27/2018  Katherine IslamMary Tyler 07/08/1929 [id number]   EMMI- General  RED ON EMMI ALERT Day # 4 Date: 01/24/18 Red Alert Reason:  Know who to call about changes in condition?  Telephone call to patient.  She answers that phone but is confused and cannot verify HIPAA.  Patient asks CM to call her daughter [name]. CM will reach out to daughter.     Bary Lericheionne J Venezia Sargeant, RN, MSN [facility] Care Management Care Management Coordinator Direct Line [phone] Toll Free: [phone]  Fax: [phone]

## 2018-01-28 ENCOUNTER — Other Ambulatory Visit: Payer: Self-pay

## 2018-01-28 NOTE — Patient Outreach (Signed)
[facility]) Care Management  01/28/2018  Katherine Tyler 10/01/1929 [id number]   9:50 am Called to [facility] CM department. No answer.  Voice message left.  2:30 pm  Called CM department spoke with Herbert SetaHeather about patient home health order.  She states unfortunately the discharging physician did not write the order for Home Health and that the order cannot be put in after the patient is discharged.  2:32 pm  [facility]phone call to [facility] to attempt to get home health order.  Voice message left for referral nurse for a return call.    Plan: RN CM will wait response from [facility].  Bary Lericheionne J Savi Lastinger, RN, MSN [facility] Care Management Care Management Coordinator Direct Line [phone] Toll Free: [phone]  Fax: [phone]

## 2018-01-29 ENCOUNTER — Other Ambulatory Visit: Payer: Self-pay

## 2018-01-29 NOTE — Patient Outreach (Signed)
[facility]) Care Management  01/29/2018  Katherine IslamMary Tyler 08/31/1929 [id number]   EMMI- General  RED ON EMMI ALERT Day # 4 Date:01/24/18 Red Alert Reason: Know who to call about changes in condition?  [place] a message from [place] [facility]. She states that unfortunately patient has to be seen before home health orders can be implemented.  She says patient can be seen as early as Friday if they would like the patient to come in earlier.   Telephone call to daughter [name].  She is able to verify HIPAA.  Explained to her that unfortunately the hospital did not order home health after discharge.  However, I  advised her that patient can be seen by the physician for home health to be ordered.  I offered her that she could be seen on Friday for follow up if they want her mom to be seen earlier. She states that they will wait for Monday appointment with Wilkie AyeKristy and discuss then.  She states that someone is normally there home with the patient and feels she is ok right now.  [facility] at this time but would like to receive a letter and brochure.    Plan: RN CM will send letter and brochure for future reference. RN CM will close case and notify care management assistant of case status.  Bary Lericheionne J Pietrina Jagodzinski, RN, MSN [facility] Care Management Care Management Coordinator Direct Line [phone] Toll Free: [phone]  Fax: [phone]

## 2018-01-30 NOTE — Progress Notes (Signed)
Cardiology Office Note    Date:  01/31/2018   ID:  Katherine Tyler, DOB 09-09-1929, MRN [id number]  PCP:  Junie Spencer, FNP  Cardiologist: Nona Dell, MD    Chief Complaint  Patient presents with  . Hospitalization Follow-up    History of Present Illness:    Katherine Tyler is a 84 y.o. female with past medical history of HTN, Hypothyroidism and newly-documented atrial fibrillation who presents to the office today for hospital follow-up.  She was recently admitted to [facility] on 01/14/2018 for evaluation of worsening dyspnea and a nonproductive cough.  BNP was found to be elevated to 1398 and CXR showed central vascular congestion with interstitial thickening and mild groundglass opacities which was most suggestive of mild CHF with possible infection. She was admitted for further treatment of acute on chronic diastolic CHF and diuresed well with IV Lasix. Cardiology was consulted as she was noted to have gone into atrial fibrillation with RVR during admission. Flecainide was administered on 01/16/2018 and she had conversion back to normal sinus rhythm. She was continued on Toprol-XL 50 mg daily and Cardizem CD 180 mg BID along with being started on Eliquis for anticoagulation. She diuresed over -8.6 L and was transitioned to Lasix 40mg  daily at the time of discharge (weight 225 lbs at that time).   In talking with the patient today, she reports overall doing well since hospital discharge.  She denies any recurrent palpitations or dyspnea on exertion.  No recent chest pain, orthopnea, PND, or lower extremity edema.  She has been following her weight at home and this has continued to decline on her home scales.  Weight is 215 LBS at today's office visit.  She does bring with her a list of heart rate and blood pressure checks and heart rate has been low from the low-40s to mid 50's.  She does note episodes of dizziness when walking around her house but "thought this was normal". Denies any presyncope  or actual syncopal events.   She reports good compliance with Eliquis and denies missing any recent doses.  No evidence of active bleeding.    Past Medical History:  Diagnosis Date  . Anxiety   . Arthritis   . GERD (gastroesophageal reflux disease)   . Hiatal hernia   . Hyperlipidemia   . Hypertension   . Hypothyroidism   . Obesity   . PAF (paroxysmal atrial fibrillation) (HCC)    a. diagnosed in 01/2018. Started on Eliquis for anticoagulation.   . Vitamin D deficiency     Past Surgical History:  Procedure Laterality Date  . CHOLECYSTECTOMY    . TUBAL LIGATION  1960    Current Medications: Outpatient Medications Prior to Visit  Medication Sig Dispense Refill  . apixaban (ELIQUIS) 5 MG TABS tablet Take 1 tablet (5 mg total) by mouth 2 (two) times daily. 60 tablet 2  . Cholecalciferol (D3-1000) 1000 UNITS capsule Take 1,000 Units by mouth daily.     . citalopram (CELEXA) 20 MG tablet TAKE ONE TABLET BY MOUTH EVERY DAY 90 tablet 0  . diltiazem (CARDIZEM CD) 180 MG 24 hr capsule Take 1 capsule (180 mg total) by mouth 2 (two) times daily. 180 capsule 1  . Ferrous Sulfate Dried (SLOW RELEASE IRON) 45 MG TBCR Take 45 mg by mouth 2 (two) times daily.     . furosemide (LASIX) 40 MG tablet Take 1 tablet (40 mg total) by mouth daily. 30 tablet 2  . levothyroxine (SYNTHROID) 112 MCG  tablet Take 1 tablet (112 mcg total) by mouth daily before breakfast. 90 tablet 1  . losartan (COZAAR) 100 MG tablet Take 1 tablet (100 mg total) by mouth daily. 90 tablet 1  . magnesium oxide (MAG-OX) 400 (241.3 MG) MG tablet Take 1 tablet (400 mg total) by mouth daily. 30 tablet 0  . nystatin (MYCOSTATIN/NYSTOP) powder Apply topically 4 (four) times daily. 15 g 0  . thiamine (VITAMIN B-1) 100 MG tablet Take 100 mg by mouth daily.    . metoprolol succinate (TOPROL-XL) 50 MG 24 hr tablet TAKE ONE TABLET BY MOUTH EVERY DAY. TAKEWITH OR IMMEDIATELY FOLLOWINGA MEAL. 90 tablet 1   No facility-administered  medications prior to visit.      Allergies:   Terazosin hcl and Keflex [cephalexin]   Social History   Socioeconomic History  . Marital status: Widowed    Spouse name: None  . Number of children: None  . Years of education: None  . Highest education level: None  Social Needs  . Financial resource strain: None  . Food insecurity - worry: None  . Food insecurity - inability: None  . Transportation needs - medical: None  . Transportation needs - non-medical: None  Occupational History  . None  Tobacco Use  . Smoking status: Never Smoker  . Smokeless tobacco: Former Neurosurgeon    Types: Snuff  Substance and Sexual Activity  . Alcohol use: No  . Drug use: No  . Sexual activity: No  Other Topics Concern  . None  Social History Narrative  . None     Family History:  The patient's family history includes Diabetes in her daughter; Heart disease in her brother, brother, brother, mother, sister, sister, and sister; Stroke in her father.   Review of Systems:   Please see the history of present illness.     General:  No chills, fever, night sweats or weight changes.  Cardiovascular:  No chest pain, dyspnea on exertion, edema, orthopnea, palpitations, paroxysmal nocturnal dyspnea. Dermatological: No rash, lesions/masses Respiratory: No cough, dyspnea Urologic: No hematuria, dysuria Abdominal:   No nausea, vomiting, diarrhea, bright red blood per rectum, melena, or hematemesis Neurologic:  No visual changes, wkns, changes in mental status. Positive for dizziness.   All other systems reviewed and are otherwise negative except as noted above.   Physical Exam:    VS:  BP 122/60   Pulse (!) 47   Ht 5\' 4"  (1.626 m)   Wt 215 lb (97.5 kg)   SpO2 98%   BMI 36.90 kg/m    General: Well developed, well nourished Caucasian female appearing in no acute distress. Head: Normocephalic, atraumatic, sclera non-icteric, no xanthomas, nares are without discharge.  Neck: No carotid bruits. JVD  not elevated.  Lungs: Respirations regular and unlabored, without wheezes or rales.  Heart: Regular rhythm, bradycardiac rate. No S3 or S4.  No murmur, no rubs, or gallops appreciated. Abdomen: Soft, non-tender, non-distended with normoactive bowel sounds. No hepatomegaly. No rebound/guarding. No obvious abdominal masses. Msk:  Strength and tone appear normal for age. No joint deformities or effusions. Extremities: No clubbing or cyanosis. No lower extremity edema.  Distal pedal pulses are 2+ bilaterally. Neuro: Alert and oriented X 3. Moves all extremities spontaneously. No focal deficits noted. Psych:  Responds to questions appropriately with a normal affect. Skin: No rashes or lesions noted  Wt Readings from Last 3 Encounters:  01/31/18 215 lb (97.5 kg)  01/18/18 223 lb 15.8 oz (101.6 kg)  10/01/17 225 lb 9.6  oz (102.3 kg)     Studies/Labs Reviewed:   EKG:  EKG is not ordered today.   Recent Labs: 01/15/2018: ALT 35; TSH 2.773 01/16/2018: Magnesium 1.8 01/18/2018: B Natriuretic Peptide 445.0; Hemoglobin 12.1; Platelets 193 01/31/2018: BUN 31; Creatinine, Ser 1.58; Potassium 4.7; Sodium 118   Lipid Panel    Component Value Date/Time   CHOL 154 04/05/2014 1051   CHOL 168 04/16/2013 1155   TRIG 93 04/05/2014 1051   TRIG 85 04/16/2013 1155   HDL 59 04/05/2014 1051   HDL 58 04/16/2013 1155   LDLCALC 76 04/05/2014 1051   LDLCALC 93 04/16/2013 1155    Additional studies/ records that were reviewed today include:   Echocardiogram: 01/15/2018 Study Conclusions  - Left ventricle: The cavity size was normal. Wall thickness was   increased in a pattern of mild LVH. Systolic function was normal.   The estimated ejection fraction was in the range of 60% to 65%.   Wall motion was normal; there were no regional wall motion   abnormalities. Features are consistent with a pseudonormal left   ventricular filling pattern, with concomitant abnormal relaxation   and increased filling pressure  (grade 2 diastolic dysfunction).   Doppler parameters are consistent with high ventricular filling   pressure. - Mitral valve: Calcified annulus. There was moderate   regurgitation. - Left atrium: The atrium was mildly dilated. - Tricuspid valve: There was moderate regurgitation. - Pulmonary arteries: PA peak pressure: 52 mm Hg (S).  Assessment:    1. PAF (paroxysmal atrial fibrillation) (HCC)   2. Current use of long term anticoagulation   3. Chronic diastolic heart failure (HCC)   4. Essential hypertension   5. Hypothyroidism, unspecified type   6. Hyponatremia   7. Medication management      Plan:   In order of problems listed above:  1. Paroxysmal Atrial Fibrillation - the patient was recently admitted for a CHF exacerbation and developed atrial fibrillation with RVR during admission. Converted to NSR with administration of PO Flecainide. She was continued on Toprol-XL 50 mg daily and Cardizem CD 180 mg BID at the time of discharge. - she denies any recurrent palpitations but has actually been quite bradycardiac when HR is checked at home, ranging from the low-40s to mid 50's. HR at 47 during today's visit. Notes associated dizziness at times but no symptoms currently. Will reduce Toprol-XL to 25mg  daily and have her to continue to follow HR at home. If this remains low, would reduce Cardizem dosing to once daily.  - she was started on Eliquis for anticoagulation during her hospitalization and denies any evidence of active bleeding. Continue Eliquis 5mg  BID.   2. Chronic Diastolic CHF - she denies any recent dyspnea on exertion, orthopnea, PND, or lower extremity edema. Does not appear volume overloaded by physical examination.  - will continue on Lasix 40mg  daily. If creatinine trending upwards, would reduce to 20mg  daily.   3. HTN - BP is well-controlled at 122/60 during today's visit. - continue Cardizem CD, Losartan and Toprol-XL (with dose adjustments as outlined above).     4. Hypothyroidism - TSH at 2.773 during recent hospitalization. Continue Synthroid.   5. Hyponatremia  - Na+ 127 at the time of hospital discharge. Recheck BMET today.    ADDENDUM: Na+ resulted significantly low at 118 and Dr. Elease Hashimoto was notified (on-call Provider at that time). Please reference phone note from 2/22 as she will hold Lasix for two days then resume at 20mg  once daily with  plans for a repeat BMET in 1 week.    Medication Adjustments/Labs and Tests Ordered: Current medicines are reviewed at length with the patient today.  Concerns regarding medicines are outlined above.  Medication changes, Labs and Tests ordered today are listed in the Patient Instructions below. Patient Instructions  Medication Instructions:  Your physician has recommended you make the following change in your medication: Decrease Toprol XL to 25 mg Daily    Labwork: Your physician recommends that you return for lab work in: BMET    Testing/Procedures: NONE   Follow-Up: Your physician recommends that you schedule a follow-up appointment in: 2 Months with Dr. Diona BrownerMcDowell.    Any Other Special Instructions Will Be Listed Below (If Applicable).   If you need a refill on your cardiac medications before your next appointment, please call your pharmacy.  Thank you for choosing [facility]!    Signed, Ellsworth LennoxBrittany M Gerrard Crystal, PA-C  01/31/2018 7:39 PM    [facility] [address] [place], [place] [address] Phone: [phone]

## 2018-01-31 ENCOUNTER — Telehealth: Payer: Self-pay | Admitting: Cardiovascular Disease

## 2018-01-31 ENCOUNTER — Ambulatory Visit (INDEPENDENT_AMBULATORY_CARE_PROVIDER_SITE_OTHER): Payer: Medicare Other | Admitting: Student

## 2018-01-31 ENCOUNTER — Encounter: Payer: Self-pay | Admitting: Student

## 2018-01-31 ENCOUNTER — Other Ambulatory Visit (HOSPITAL_COMMUNITY)
Admission: RE | Admit: 2018-01-31 | Discharge: 2018-01-31 | Disposition: A | Discharge status: Home / Self Care | Payer: Medicare Other | Source: Ambulatory Visit | Attending: Student | Admitting: Student

## 2018-01-31 VITALS — BP 122/60 | HR 47 | Ht 64.0 in | Wt 215.0 lb

## 2018-01-31 DIAGNOSIS — Z79899 Other long term (current) drug therapy: Secondary | ICD-10-CM | POA: Diagnosis not present

## 2018-01-31 DIAGNOSIS — E871 Hypo-osmolality and hyponatremia: Secondary | ICD-10-CM | POA: Diagnosis not present

## 2018-01-31 DIAGNOSIS — I480 Paroxysmal atrial fibrillation: Secondary | ICD-10-CM

## 2018-01-31 DIAGNOSIS — I10 Essential (primary) hypertension: Secondary | ICD-10-CM | POA: Diagnosis not present

## 2018-01-31 DIAGNOSIS — E039 Hypothyroidism, unspecified: Secondary | ICD-10-CM | POA: Diagnosis not present

## 2018-01-31 DIAGNOSIS — Z7901 Long term (current) use of anticoagulants: Secondary | ICD-10-CM

## 2018-01-31 DIAGNOSIS — I5032 Chronic diastolic (congestive) heart failure: Secondary | ICD-10-CM | POA: Insufficient documentation

## 2018-01-31 LAB — BASIC METABOLIC PANEL
ANION GAP: 10 (ref 5–15)
BUN: 31 mg/dL — ABNORMAL HIGH (ref 6–20)
CALCIUM: 9.4 mg/dL (ref 8.9–10.3)
CO2: 24 mmol/L (ref 22–32)
Chloride: 84 mmol/L — ABNORMAL LOW (ref 101–111)
Creatinine, Ser: 1.58 mg/dL — ABNORMAL HIGH (ref 0.44–1.00)
GFR, EST AFRICAN AMERICAN: 33 mL/min — AB (ref >60)
GFR, EST NON AFRICAN AMERICAN: 28 mL/min — AB (ref >60)
Glucose, Bld: 97 mg/dL (ref 65–99)
POTASSIUM: 4.7 mmol/L (ref 3.5–5.1)
Sodium: 118 mmol/L — CL (ref 135–145)

## 2018-01-31 MED ORDER — METOPROLOL SUCCINATE ER 25 MG PO TB24: 25.0000 mg | ORAL_TABLET | Freq: Every day | ORAL | 3 refills | Status: DC

## 2018-01-31 NOTE — Telephone Encounter (Signed)
Was called by Lab - critical sodium of 118 Creatinine is 1.5   Pt does not know her meds.   Her daughter fixes her pill boxes  Called daughter.  Katherine Tyler [phone]( [phone])  Instructed her to remove the  Furosemide from the Sat. And Sunday pill box  On Monday ,  Start 1/2 tab ( 20 mg furosemide) a day   Instructed patient to have a cup of chicken noodle soup tonight   She will need to have a repeat BMP in 1-2 weeks.    [name] MissPhilip Benyamin Jeff, MD  01/31/2018 6:38 PM    [facility] [address]  [address] [place], [place]  [address] Pager [phone] Phone: [phone]; Fax: [phone]

## 2018-01-31 NOTE — Patient Instructions (Signed)
Medication Instructions:  Your physician has recommended you make the following change in your medication: Decrease Toprol XL to 25 mg Daily    Labwork: Your physician recommends that you return for lab work in: BMET    Testing/Procedures: NONE   Follow-Up: Your physician recommends that you schedule a follow-up appointment in: 2 Months with Dr. Diona BrownerMcDowell.    Any Other Special Instructions Will Be Listed Below (If Applicable).     If you need a refill on your cardiac medications before your next appointment, please call your pharmacy.  Thank you for choosing [facility]!

## 2018-02-03 ENCOUNTER — Ambulatory Visit (INDEPENDENT_AMBULATORY_CARE_PROVIDER_SITE_OTHER): Payer: Medicare Other | Admitting: Family

## 2018-02-03 ENCOUNTER — Encounter: Payer: Self-pay | Admitting: *Deleted

## 2018-02-03 ENCOUNTER — Encounter: Payer: Self-pay | Admitting: Family

## 2018-02-03 VITALS — BP 125/51 | HR 52 | Temp 97.2°F | Ht 64.0 in | Wt 214.8 lb

## 2018-02-03 DIAGNOSIS — E039 Hypothyroidism, unspecified: Secondary | ICD-10-CM

## 2018-02-03 DIAGNOSIS — Z09 Encounter for follow-up examination after completed treatment for conditions other than malignant neoplasm: Secondary | ICD-10-CM

## 2018-02-03 DIAGNOSIS — I509 Heart failure, unspecified: Secondary | ICD-10-CM | POA: Diagnosis not present

## 2018-02-03 DIAGNOSIS — I10 Essential (primary) hypertension: Secondary | ICD-10-CM | POA: Diagnosis not present

## 2018-02-03 DIAGNOSIS — E669 Obesity, unspecified: Secondary | ICD-10-CM | POA: Diagnosis not present

## 2018-02-03 DIAGNOSIS — M19011 Primary osteoarthritis, right shoulder: Secondary | ICD-10-CM

## 2018-02-03 DIAGNOSIS — D509 Iron deficiency anemia, unspecified: Secondary | ICD-10-CM

## 2018-02-03 DIAGNOSIS — R6889 Other general symptoms and signs: Secondary | ICD-10-CM | POA: Diagnosis not present

## 2018-02-03 NOTE — Progress Notes (Signed)
Subjective:    Patient ID: Katherine Tyler, female    DOB: [birth year], 84 y.o.   MRN: [id number]  Pt presents to the office today for chronic follow up. Pt is followed by Cardiologists  For CHF and A Fib. PT was admitted to the hospital for a week for CHF.  Congestive Heart Failure  Presents for follow-up visit. Pertinent negatives include no edema, fatigue, shortness of breath or unexpected weight change. The symptoms have been stable.  Hypertension  This is a chronic problem. The current episode started more than 1 year ago. The problem has been resolved since onset. The problem is controlled. Associated symptoms include peripheral edema. Pertinent negatives include no malaise/fatigue or shortness of breath. Risk factors for coronary artery disease include obesity. The current treatment provides moderate improvement. Hypertensive end-organ damage includes heart failure. Identifiable causes of hypertension include a thyroid problem.  Thyroid Problem  Presents for follow-up visit. Patient reports no constipation, diarrhea or fatigue. The symptoms have been stable. Her past medical history is significant for heart failure.  Anemia  Presents for follow-up visit. There has been no anorexia, confusion, malaise/fatigue or pica. Past medical history includes heart failure.  Arthritis  Presents for follow-up visit. She complains of pain and stiffness. Pertinent negatives include no diarrhea or fatigue.      Review of Systems  Constitutional: Negative for fatigue, malaise/fatigue and unexpected weight change.  Respiratory: Negative for shortness of breath.   Gastrointestinal: Negative for anorexia, constipation and diarrhea.  Musculoskeletal: Positive for arthritis and stiffness.  Psychiatric/Behavioral: Negative for confusion.  All other systems reviewed and are negative.      Objective:   Physical Exam  Constitutional: She is oriented to person, place, and time. She appears well-developed and  well-nourished. No distress.  HENT:  Head: Normocephalic and atraumatic.  Right Ear: External ear normal.  Left Ear: External ear normal.  Nose: Nose normal.  Mouth/Throat: Oropharynx is clear and moist.  Eyes: Pupils are equal, round, and reactive to light.  Neck: Normal range of motion. Neck supple. No thyromegaly present.  Cardiovascular: Normal rate, regular rhythm, normal heart sounds and intact distal pulses.  No murmur heard. Pulmonary/Chest: Effort normal and breath sounds normal. No respiratory distress. She has no wheezes.  Abdominal: Soft. Bowel sounds are normal. She exhibits no distension. There is no tenderness.  Musculoskeletal: She exhibits no edema or tenderness.  Using rolling walker  Neurological: She is alert and oriented to person, place, and time.  Skin: Skin is warm and dry.  Psychiatric: She has a normal mood and affect. Her behavior is normal. Judgment and thought content normal.  Vitals reviewed.     BP (!) 125/51   Pulse (!) 52   Temp (!) 97.2 F (36.2 C)   Ht '5\' 4"'$  (1.626 m)   Wt 214 lb 12.8 oz (97.4 kg)   BMI 36.87 kg/m      Assessment & Plan:  1. Essential hypertension - CMP14+EGFR  2. Acute congestive heart failure, unspecified heart failure type ([place]) - CMP14+EGFR  3. Hypothyroidism, unspecified type TSH drawn on 01/15/18 in hospital, normal at that time - CMP14+EGFR  4. Primary osteoarthritis of right shoulder - CMP14+EGFR  5. Obesity (BMI 30-39.9) - CMP14+EGFR  6. Iron deficiency anemia, unspecified iron deficiency anemia type - CMP14+EGFR - Anemia Profile B  7. Hospital discharge follow-up   Continue all meds and keep follow up with Cardiologists  Labs pending Health Maintenance reviewed Diet and exercise encouraged RTO 4 months  Evelina Dun, FNP

## 2018-02-03 NOTE — Patient Instructions (Signed)
Heart Failure Heart failure is a condition in which the heart has trouble pumping blood because it has become weak or stiff. This means that the heart does not pump blood efficiently for the body to work well. For some people with heart failure, fluid may back up into the lungs and there may be swelling (edema) in the lower legs. Heart failure is usually a long-term (chronic) condition. It is important for you to take good care of yourself and follow the treatment plan from your health care provider. What are the causes? This condition is caused by some health problems, including:  High blood pressure (hypertension). Hypertension causes the heart muscle to work harder than normal. High blood pressure eventually causes the heart to become stiff and weak.  Coronary artery disease (CAD). CAD is the buildup of cholesterol and fat (plaques) in the arteries of the heart.  Heart attack (myocardial infarction). Injured tissue, which is caused by the heart attack, does not contract as well and the heart's ability to pump blood is weakened.  Abnormal heart valves. When the heart valves do not open and close properly, the heart muscle must pump harder to keep the blood flowing.  Heart muscle disease (cardiomyopathy or myocarditis). Heart muscle disease is damage to the heart muscle from a variety of causes, such as drug or alcohol abuse, infections, or unknown causes. These can increase the risk of heart failure.  Lung disease. When the lungs do not work properly, the heart must work harder.  What increases the risk? Risk of heart failure increases as a person ages. This condition is also more likely to develop in people who:  Are overweight.  Are female.  Smoke or chew tobacco.  Abuse alcohol or illegal drugs.  Have taken medicines that can damage the heart, such as chemotherapy drugs.  Have diabetes. ? High blood sugar (glucose) is associated with high fat (lipid) levels in the blood. ? Diabetes  can also damage tiny blood vessels that carry nutrients to the heart muscle.  Have abnormal heart rhythms.  Have thyroid problems.  Have low blood counts (anemia).  What are the signs or symptoms? Symptoms of this condition include:  Shortness of breath with activity, such as when climbing stairs.  Persistent cough.  Swelling of the feet, ankles, legs, or abdomen.  Unexplained weight gain.  Difficulty breathing when lying flat (orthopnea).  Waking from sleep because of the need to sit up and get more air.  Rapid heartbeat.  Fatigue and loss of energy.  Feeling light-headed, dizzy, or close to fainting.  Loss of appetite.  Nausea.  Increased urination during the night (nocturia).  Confusion.  How is this diagnosed? This condition is diagnosed based on:  Medical history, symptoms, and a physical exam.  Diagnostic tests, which may include: ? Echocardiogram. ? Electrocardiogram (ECG). ? Chest X-ray. ? Blood tests. ? Exercise stress test. ? Radionuclide scans. ? Cardiac catheterization and angiogram.  How is this treated? Treatment for this condition is aimed at managing the symptoms of heart failure. Medicines, behavioral changes, or other treatments may be necessary to treat heart failure. Medicines These may include:  Angiotensin-converting enzyme (ACE) inhibitors. This type of medicine blocks the effects of a blood protein called angiotensin-converting enzyme. ACE inhibitors relax (dilate) the blood vessels and help to lower blood pressure.  Angiotensin receptor blockers (ARBs). This type of medicine blocks the actions of a blood protein called angiotensin. ARBs dilate the blood vessels and help to lower blood pressure.  Water   pills (diuretics). Diuretics cause the kidneys to remove salt and water from the blood. The extra fluid is removed through urination, leaving a lower volume of blood that the heart has to pump.  Beta blockers. These improve heart  muscle strength and they prevent the heart from beating too quickly.  Digoxin. This increases the force of the heartbeat.  Healthy behavior changes These may include:  Reaching and maintaining a healthy weight.  Stopping smoking or chewing tobacco.  Eating heart-healthy foods.  Limiting or avoiding alcohol.  Stopping use of street drugs (illegal drugs).  Physical activity.  Other treatments These may include:  Surgery to open blocked coronary arteries or repair damaged heart valves.  Placement of a biventricular pacemaker to improve heart muscle function (cardiac resynchronization therapy). This device paces both the right ventricle and left ventricle.  Placement of a device to treat serious abnormal heart rhythms (implantable cardioverter defibrillator, or ICD).  Placement of a device to improve the pumping ability of the heart (left ventricular assist device, or LVAD).  Heart transplant. This can cure heart failure, and it is considered for certain patients who do not improve with other therapies.  Follow these instructions at home: Medicines  Take over-the-counter and prescription medicines only as told by your health care provider. Medicines are important in reducing the workload of your heart, slowing the progression of heart failure, and improving your symptoms. ? Do not stop taking your medicine unless your health care provider told you to do that. ? Do not skip any dose of medicine. ? Refill your prescriptions before you run out of medicine. You need your medicines every day. Eating and drinking   Eat heart-healthy foods. Talk with a dietitian to make an eating plan that is right for you. ? Choose foods that contain no trans fat and are low in saturated fat and cholesterol. Healthy choices include fresh or frozen fruits and vegetables, fish, lean meats, legumes, fat-free or low-fat dairy products, and whole-grain or high-fiber foods. ? Limit salt (sodium) if  directed by your health care provider. Sodium restriction may reduce symptoms of heart failure. Ask a dietitian to recommend heart-healthy seasonings. ? Use healthy cooking methods instead of frying. Healthy methods include roasting, grilling, broiling, baking, poaching, steaming, and stir-frying.  Limit your fluid intake if directed by your health care provider. Fluid restriction may reduce symptoms of heart failure. Lifestyle  Stop smoking or using chewing tobacco. Nicotine and tobacco can damage your heart and your blood vessels. Do not use nicotine gum or patches before talking to your health care provider.  Limit alcohol intake to no more than 1 drink per day for non-pregnant women and 2 drinks per day for men. One drink equals 12 oz of beer, 5 oz of wine, or 1 oz of hard liquor. ? Drinking more than that is harmful to your heart. Tell your health care provider if you drink alcohol several times a week. ? Talk with your health care provider about whether any level of alcohol use is safe for you. ? If your heart has already been damaged by alcohol or you have severe heart failure, drinking alcohol should be stopped completely.  Stop use of illegal drugs.  Lose weight if directed by your health care provider. Weight loss may reduce symptoms of heart failure.  Do moderate physical activity if directed by your health care provider. People who are elderly and people with severe heart failure should consult with a health care provider for physical activity recommendations.   Monitor important information  Weigh yourself every day. Keeping track of your weight daily helps you to notice excess fluid sooner. ? Weigh yourself every morning after you urinate and before you eat breakfast. ? Wear the same amount of clothing each time you weigh yourself. ? Record your daily weight. Provide your health care provider with your weight record.  Monitor and record your blood pressure as told by your health  care provider.  Check your pulse as told by your health care provider. Dealing with extreme temperatures  If the weather is extremely hot: ? Avoid vigorous physical activity. ? Use air conditioning or fans or seek a cooler location. ? Avoid caffeine and alcohol. ? Wear loose-fitting, lightweight, and light-colored clothing.  If the weather is extremely cold: ? Avoid vigorous physical activity. ? Layer your clothes. ? Wear mittens or gloves, a hat, and a scarf when you go outside. ? Avoid alcohol. General instructions  Manage other health conditions such as hypertension, diabetes, thyroid disease, or abnormal heart rhythms as told by your health care provider.  Learn to manage stress. If you need help to do this, ask your health care provider.  Plan rest periods when fatigued.  Get ongoing education and support as needed.  Participate in or seek rehabilitation as needed to maintain or improve independence and quality of life.  Stay up to date with immunizations. Keeping current on pneumococcal and influenza immunizations is especially important to prevent respiratory infections.  Keep all follow-up visits as told by your health care provider. This is important. Contact a health care provider if:  You have a rapid weight gain.  You have increasing shortness of breath that is unusual for you.  You are unable to participate in your usual physical activities.  You tire easily.  You cough more than normal, especially with physical activity.  You have any swelling or more swelling in areas such as your hands, feet, ankles, or abdomen.  You are unable to sleep because it is hard to breathe.  You feel like your heart is beating quickly (palpitations).  You become dizzy or light-headed when you stand up. Get help right away if:  You have difficulty breathing.  You notice or your family notices a change in your awareness, such as having trouble staying awake or having  difficulty with concentration.  You have pain or discomfort in your chest.  You have an episode of fainting (syncope). This information is not intended to replace advice given to you by your health care provider. Make sure you discuss any questions you have with your health care provider. Document Released: 11/26/2005 Document Revised: 07/31/2016 Document Reviewed: 06/20/2016 Elsevier Interactive Patient Education  2018 [employer] Inc[employer]  

## 2018-02-04 ENCOUNTER — Telehealth: Payer: Self-pay | Admitting: *Deleted

## 2018-02-04 ENCOUNTER — Other Ambulatory Visit: Payer: Self-pay | Admitting: Family

## 2018-02-04 LAB — CMP14+EGFR
ALT: 22 IU/L (ref 0–32)
AST: 27 IU/L (ref 0–40)
Albumin/Globulin Ratio: 1.6 (ref 1.2–2.2)
Albumin: 4.4 g/dL (ref 3.5–4.7)
Alkaline Phosphatase: 74 IU/L (ref 39–117)
BUN/Creatinine Ratio: 22 (ref 12–28)
BUN: 36 mg/dL — AB (ref 8–27)
Bilirubin Total: 0.3 mg/dL (ref 0.0–1.2)
CALCIUM: 9.7 mg/dL (ref 8.7–10.3)
CO2: 20 mmol/L (ref 20–29)
Chloride: 88 mmol/L — ABNORMAL LOW (ref 96–106)
Creatinine, Ser: 1.65 mg/dL — ABNORMAL HIGH (ref 0.57–1.00)
GFR, EST AFRICAN AMERICAN: 32 mL/min/{1.73_m2} — AB (ref >59)
GFR, EST NON AFRICAN AMERICAN: 28 mL/min/{1.73_m2} — AB (ref >59)
GLUCOSE: 101 mg/dL — AB (ref 65–99)
Globulin, Total: 2.8 g/dL (ref 1.5–4.5)
Potassium: 5.1 mmol/L (ref 3.5–5.2)
Sodium: 128 mmol/L — ABNORMAL LOW (ref 134–144)
TOTAL PROTEIN: 7.2 g/dL (ref 6.0–8.5)

## 2018-02-04 LAB — ANEMIA PROFILE B
BASOS: 0 %
Basophils Absolute: 0 10*3/uL (ref 0.0–0.2)
EOS (ABSOLUTE): 0.1 10*3/uL (ref 0.0–0.4)
EOS: 1 %
FERRITIN: 249 ng/mL — AB (ref 15–150)
Folate: >20 ng/mL (ref >3.0)
HEMATOCRIT: 33.4 % — AB (ref 34.0–46.6)
HEMOGLOBIN: 11.6 g/dL (ref 11.1–15.9)
IRON SATURATION: 13 % — AB (ref 15–55)
Immature Grans (Abs): 0 10*3/uL (ref 0.0–0.1)
Immature Granulocytes: 0 %
Iron: 39 ug/dL (ref 27–139)
Lymphocytes Absolute: 0.7 10*3/uL (ref 0.7–3.1)
Lymphs: 11 %
MCH: 33.2 pg — ABNORMAL HIGH (ref 26.6–33.0)
MCHC: 34.7 g/dL (ref 31.5–35.7)
MCV: 96 fL (ref 79–97)
Monocytes Absolute: 0.4 10*3/uL (ref 0.1–0.9)
Monocytes: 7 %
NEUTROS ABS: 4.8 10*3/uL (ref 1.4–7.0)
Neutrophils: 81 %
Platelets: 201 10*3/uL (ref 150–379)
RBC: 3.49 x10E6/uL — AB (ref 3.77–5.28)
RDW: 13.9 % (ref 12.3–15.4)
Retic Ct Pct: 1.5 % (ref 0.6–2.6)
Total Iron Binding Capacity: 304 ug/dL (ref 250–450)
UIBC: 265 ug/dL (ref 118–369)
VITAMIN B 12: 342 pg/mL (ref 232–1245)
WBC: 6 10*3/uL (ref 3.4–10.8)

## 2018-02-04 MED ORDER — FUROSEMIDE 20 MG PO TABS: 20.0000 mg | ORAL_TABLET | ORAL | 3 refills | Status: DC

## 2018-02-04 NOTE — Telephone Encounter (Signed)
-----   Message from Ellsworth LennoxBrittany M Strader, [place] sent at 02/04/2018 11:22 AM EST ----- Thank you for letting me know. I would recommend she just take Lasix 20mg  every other day and repeat another BMET in 1 week to see how her kidney function is trending.  Kisha,  Can you please call her and inform her to only take Lasix 20mg  every other day. If weight goes up by more than 5 lbs, she should notify our office. It was not listed as she was on K+ supplementation at the time of her last visit. Please verify she is not taking K+ supplementation. If she is, she should only take this on the days she takes Lasix.   Thanks,  [place]

## 2018-02-06 ENCOUNTER — Other Ambulatory Visit: Payer: Self-pay | Admitting: *Deleted

## 2018-02-06 ENCOUNTER — Other Ambulatory Visit: Payer: Medicare Other

## 2018-02-06 DIAGNOSIS — Z79899 Other long term (current) drug therapy: Secondary | ICD-10-CM | POA: Diagnosis not present

## 2018-02-06 NOTE — Addendum Note (Signed)
Addended by: Kerney ElbePINNIX, LUKISHA G on: 02/06/2018 09:12 AM   Modules accepted: Orders

## 2018-02-07 LAB — BASIC METABOLIC PANEL
BUN/Creatinine Ratio: 22 (ref 12–28)
BUN: 31 mg/dL — ABNORMAL HIGH (ref 8–27)
CO2: 23 mmol/L (ref 20–29)
CREATININE: 1.43 mg/dL — AB (ref 0.57–1.00)
Calcium: 9.6 mg/dL (ref 8.7–10.3)
Chloride: 86 mmol/L — ABNORMAL LOW (ref 96–106)
GFR, EST AFRICAN AMERICAN: 38 mL/min/{1.73_m2} — AB (ref >59)
GFR, EST NON AFRICAN AMERICAN: 33 mL/min/{1.73_m2} — AB (ref >59)
Glucose: 89 mg/dL (ref 65–99)
POTASSIUM: 4.7 mmol/L (ref 3.5–5.2)
Sodium: 126 mmol/L — ABNORMAL LOW (ref 134–144)

## 2018-04-02 ENCOUNTER — Other Ambulatory Visit: Payer: Self-pay | Admitting: Family

## 2018-04-02 DIAGNOSIS — F411 Generalized anxiety disorder: Secondary | ICD-10-CM

## 2018-04-07 NOTE — Progress Notes (Signed)
Cardiology Office Note  Date: 04/08/2018   ID: Katherine Tyler, DOB 10/25/1929, MRN [id number]  PCP: Junie Spencer, FNP  Primary Cardiologist: Nona Dell, MD   Chief Complaint  Patient presents with  . Atrial Fibrillation    History of Present Illness: Katherine Tyler is an 84 y.o. female last seen in February by Ms. Strader PA-C.  She presents with family member for a follow-up visit. At her last visit Toprol-XL was reduced to 25 mg daily and she otherwise continued on Cardizem CD at 180 mg twice daily along with Eliquis for stroke prophylaxis.  Lasix was also reduced due to documented hyponatremia.  States that previous dizziness has improved.  He is ambulating with a walker, no falls, no palpitations or chest pain.  Heart rate today is in the 60s.  Current cardiac regimen includes Eliquis, Cardizem CD, and Toprol-XL.  Reviewed home blood pressure checks and weights.  She continues on low-dose Lasix every other day.  No significant orthopnea or leg swelling.  Past Medical History:  Diagnosis Date  . Anxiety   . Arthritis   . GERD (gastroesophageal reflux disease)   . Hiatal hernia   . Hyperlipidemia   . Hypertension   . Hypothyroidism   . Obesity   . PAF (paroxysmal atrial fibrillation) (HCC)    a. diagnosed in 01/2018. Started on Eliquis for anticoagulation.   . Vitamin D deficiency     Past Surgical History:  Procedure Laterality Date  . CHOLECYSTECTOMY    . TUBAL LIGATION  1960    Current Outpatient Medications  Medication Sig Dispense Refill  . apixaban (ELIQUIS) 5 MG TABS tablet Take 1 tablet (5 mg total) by mouth 2 (two) times daily. 60 tablet 2  . Cholecalciferol (D3-1000) 1000 UNITS capsule Take 1,000 Units by mouth daily.     . citalopram (CELEXA) 20 MG tablet TAKE ONE TABLET BY MOUTH EVERY DAY 90 tablet 0  . diltiazem (CARDIZEM CD) 180 MG 24 hr capsule Take 1 capsule (180 mg total) by mouth 2 (two) times daily. 180 capsule 1  . Ferrous Sulfate Dried (SLOW  RELEASE IRON) 45 MG TBCR Take 45 mg by mouth 2 (two) times daily.     . furosemide (LASIX) 20 MG tablet Take 1 tablet (20 mg total) by mouth every other day. 45 tablet 3  . levothyroxine (SYNTHROID) 112 MCG tablet Take 1 tablet (112 mcg total) by mouth daily before breakfast. 90 tablet 1  . losartan (COZAAR) 100 MG tablet Take 1 tablet (100 mg total) by mouth daily. 90 tablet 1  . magnesium oxide (MAG-OX) 400 (241.3 MG) MG tablet Take 1 tablet (400 mg total) by mouth daily. 30 tablet 0  . metoprolol succinate (TOPROL XL) 25 MG 24 hr tablet Take 1 tablet (25 mg total) by mouth daily. 90 tablet 3  . nystatin (MYCOSTATIN/NYSTOP) powder Apply topically 4 (four) times daily. 15 g 0  . thiamine (VITAMIN B-1) 100 MG tablet Take 100 mg by mouth daily.     No current facility-administered medications for this visit.    Allergies:  Terazosin hcl and Keflex [cephalexin]   Social History: The patient  reports that she has never smoked. She quit smokeless tobacco use about 61 years ago. Her smokeless tobacco use included snuff. She reports that she does not drink alcohol or use drugs.   ROS:  Please see the history of present illness. Otherwise, complete review of systems is positive for hearing loss.  All other systems are  reviewed and negative.   Physical Exam: VS:  BP 134/72 (BP Location: Right Arm)   Pulse 61   Ht  (1.6 m)   Wt 215 lb (97.5 kg)   SpO2 97%   BMI 38.09 kg/m , BMI Body mass index is 38.09 kg/m.  Wt Readings from Last 3 Encounters:  04/08/18 215 lb (97.5 kg)  02/03/18 214 lb 12.8 oz (97.4 kg)  01/31/18 215 lb (97.5 kg)    General: Elderly woman, appears comfortable at rest.  Using walker. HEENT: Conjunctiva and lids normal, oropharynx clear. Neck: Supple, no elevated JVP or carotid bruits, no thyromegaly. Lungs: Clear to auscultation, nonlabored breathing at rest. Cardiac: Regular rate and rhythm, no S3, soft systolic murmur. Abdomen: Soft, nontender, bowel sounds  present. Extremities: No pitting edema, distal pulses 2+. Skin: Warm and dry. Musculoskeletal: No kyphosis. Neuropsychiatric: Alert and oriented x3, affect grossly appropriate.  ECG: I personally reviewed the tracing from 01/17/2017 which showed sinus rhythm with prolonged PR interval and increased voltage.  Recent Labwork: 01/15/2018: TSH 2.773 01/16/2018: Magnesium 1.8 01/18/2018: B Natriuretic Peptide 445.0 02/03/2018: ALT 22; AST 27; Hemoglobin 11.6; Platelets 201 02/06/2018: BUN 31; Creatinine, Ser 1.43; Potassium 4.7; Sodium 126   Other Studies Reviewed Today:  Echocardiogram 01/15/2018: Study Conclusions  - Left ventricle: The cavity size was normal. Wall thickness was   increased in a pattern of mild LVH. Systolic function was normal.   The estimated ejection fraction was in the range of 60% to 65%.   Wall motion was normal; there were no regional wall motion   abnormalities. Features are consistent with a pseudonormal left   ventricular filling pattern, with concomitant abnormal relaxation   and increased filling pressure (grade 2 diastolic dysfunction).   Doppler parameters are consistent with high ventricular filling   pressure. - Mitral valve: Calcified annulus. There was moderate   regurgitation. - Left atrium: The atrium was mildly dilated. - Tricuspid valve: There was moderate regurgitation. - Pulmonary arteries: PA peak pressure: 52 mm Hg (S).  Assessment and Plan:  1.  Paroxysmal atrial fibrillation.  Maintaining sinus rhythm recently on combination of Cardizem CD and metoprolol.  She continues on Eliquis for stroke prophylaxis.  No bleeding problems.  Lab work from February reviewed.  2.  Chronic diastolic heart failure, weights are stable.  Lasix has been cut back due to hyponatremia with follow-up sodium improving.  3.  Essential hypertension, no changes made to current regimen.  4.  Hypothyroidism, on Synthroid.  Keep follow-up with PCP.  Current medicines were  reviewed with the patient today.   Disposition: Follow-up in 4 months.  Signed, Jonelle Sidle, MD, [facility] 04/08/2018 11:23 AM    [facility] at [facility] [address], [place], [place] [address] Phone: [phone]; Fax: [phone]

## 2018-04-08 ENCOUNTER — Encounter: Payer: Self-pay | Admitting: Cardiology

## 2018-04-08 ENCOUNTER — Ambulatory Visit (INDEPENDENT_AMBULATORY_CARE_PROVIDER_SITE_OTHER): Payer: Medicare Other | Admitting: Cardiology

## 2018-04-08 VITALS — BP 134/72 | HR 61 | Ht 63.0 in | Wt 215.0 lb

## 2018-04-08 DIAGNOSIS — I480 Paroxysmal atrial fibrillation: Secondary | ICD-10-CM | POA: Diagnosis not present

## 2018-04-08 DIAGNOSIS — I5032 Chronic diastolic (congestive) heart failure: Secondary | ICD-10-CM

## 2018-04-08 DIAGNOSIS — E039 Hypothyroidism, unspecified: Secondary | ICD-10-CM

## 2018-04-08 DIAGNOSIS — I10 Essential (primary) hypertension: Secondary | ICD-10-CM

## 2018-04-08 NOTE — Patient Instructions (Signed)
Your physician wants you to follow-up in: 4 months with Dr.McDowell     Your physician recommends that you continue on your current medications as directed. Please refer to the Current Medication list given to you today.   If you need a refill on your cardiac medications before your next appointment, please call your pharmacy.      No lab work or tests ordered today       Thank you for choosing [facility] !         

## 2018-04-18 ENCOUNTER — Other Ambulatory Visit: Payer: Self-pay | Admitting: Cardiology

## 2018-04-18 MED ORDER — APIXABAN 5 MG PO TABS: 5.0000 mg | ORAL_TABLET | Freq: Two times a day (BID) | ORAL | 6 refills | Status: DC

## 2018-04-18 NOTE — Telephone Encounter (Signed)
Needing a refill on apixaban (ELIQUIS) 5 MG TABS tablet [[id number]]  Sent to [facility] in [place]

## 2018-04-18 NOTE — Telephone Encounter (Signed)
Pharmacy changed to [facility], refilled eliquis

## 2018-05-09 ENCOUNTER — Encounter: Payer: Self-pay | Admitting: Family

## 2018-05-09 ENCOUNTER — Ambulatory Visit (INDEPENDENT_AMBULATORY_CARE_PROVIDER_SITE_OTHER): Payer: Medicare Other | Admitting: Family

## 2018-05-09 VITALS — BP 159/57 | HR 55 | Temp 97.9°F | Ht 63.0 in | Wt 214.2 lb

## 2018-05-09 DIAGNOSIS — J189 Pneumonia, unspecified organism: Secondary | ICD-10-CM | POA: Diagnosis not present

## 2018-05-09 MED ORDER — PREDNISONE 10 MG (21) PO TBPK: ORAL_TABLET | ORAL | 0 refills | Status: DC

## 2018-05-09 MED ORDER — AZITHROMYCIN 250 MG PO TABS: ORAL_TABLET | ORAL | 0 refills | Status: DC

## 2018-05-09 NOTE — Patient Instructions (Signed)

## 2018-05-09 NOTE — Progress Notes (Signed)
   Subjective:    Patient ID: Katherine IslamMary Schoenfeldt, female    DOB: 05/04/1929, [age] y.o.   MRN: [id number]  Chief Complaint  Patient presents with  . Cough    1 week   . Nasal Congestion    Cough  This is a new problem. The current episode started in the past 7 days. The problem has been gradually worsening. The problem occurs every few minutes. The cough is productive of sputum. Associated symptoms include myalgias, nasal congestion, postnasal drip, rhinorrhea, a sore throat and wheezing. Pertinent negatives include no chills, ear congestion, ear pain, fever, headaches or shortness of breath. The symptoms are aggravated by lying down and pollens. She has tried rest for the symptoms. The treatment provided no relief.      Review of Systems  Constitutional: Negative for chills and fever.  HENT: Positive for postnasal drip, rhinorrhea and sore throat. Negative for ear pain.   Respiratory: Positive for cough and wheezing. Negative for shortness of breath.   Musculoskeletal: Positive for myalgias.  Neurological: Negative for headaches.  All other systems reviewed and are negative.      Objective:   Physical Exam  Constitutional: She is oriented to person, place, and time. She appears well-developed and well-nourished. No distress.  HENT:  Head: Normocephalic and atraumatic.  Right Ear: External ear normal.  Left Ear: External ear normal.  Nose: Rhinorrhea and nose lacerations present.  Mouth/Throat: Posterior oropharyngeal erythema present.  Eyes: Pupils are equal, round, and reactive to light.  Neck: Normal range of motion. Neck supple. No thyromegaly present.  Cardiovascular: Normal rate, regular rhythm, normal heart sounds and intact distal pulses.  No murmur heard. Pulmonary/Chest: Effort normal. No respiratory distress. She has no wheezes. She has rales.  Coarse cough  Abdominal: Soft. Bowel sounds are normal. She exhibits no distension. There is no tenderness.  Musculoskeletal: She  exhibits no edema or tenderness.  Using rolling walker  Neurological: She is alert and oriented to person, place, and time. She has normal reflexes. No cranial nerve deficit.  Skin: Skin is warm and dry.  Psychiatric: She has a normal mood and affect. Her behavior is normal. Judgment and thought content normal.  Vitals reviewed.     BP (!) 159/57   Pulse (!) 55   Temp 97.9 F (36.6 C) (Oral)   Ht 5\' 3"  (1.6 m)   Wt 214 lb 3.2 oz (97.2 kg)   BMI 37.94 kg/m      Assessment & Plan:  [name] was seen today for cough and nasal congestion.  Diagnoses and all orders for this visit:  Community acquired pneumonia, unspecified laterality -     predniSONE (STERAPRED UNI-PAK 21 TAB) 10 MG (21) TBPK tablet; Use as directed -     azithromycin (ZITHROMAX Z-PAK) 250 MG tablet; As directed   - Take meds as prescribed - Use a cool mist humidifier  -Use saline nose sprays frequently -Force fluids -For any cough or congestion  Use plain Mucinex- regular strength or max strength is fine -For fever or aces or pains- take tylenol or ibuprofen. -Throat lozenges if help RTO if symptoms worsen or do improve, keep chronic follow up  Jannifer Rodneyhristy Ozzy Bohlken, FNP

## 2018-05-12 ENCOUNTER — Other Ambulatory Visit: Payer: Self-pay | Admitting: Family

## 2018-05-12 ENCOUNTER — Telehealth: Payer: Self-pay | Admitting: Family

## 2018-05-12 DIAGNOSIS — I10 Essential (primary) hypertension: Secondary | ICD-10-CM

## 2018-05-12 DIAGNOSIS — E039 Hypothyroidism, unspecified: Secondary | ICD-10-CM

## 2018-05-13 NOTE — Telephone Encounter (Signed)
Ok, great. Let me know if her symptoms do not resolve.

## 2018-06-03 ENCOUNTER — Encounter: Payer: Self-pay | Admitting: Family

## 2018-06-03 ENCOUNTER — Ambulatory Visit (INDEPENDENT_AMBULATORY_CARE_PROVIDER_SITE_OTHER): Payer: Medicare Other | Admitting: Family

## 2018-06-03 VITALS — BP 132/52 | HR 53 | Temp 97.5°F | Ht 63.0 in | Wt 218.0 lb

## 2018-06-03 DIAGNOSIS — E8881 Metabolic syndrome: Secondary | ICD-10-CM | POA: Diagnosis not present

## 2018-06-03 DIAGNOSIS — F411 Generalized anxiety disorder: Secondary | ICD-10-CM

## 2018-06-03 DIAGNOSIS — I10 Essential (primary) hypertension: Secondary | ICD-10-CM

## 2018-06-03 DIAGNOSIS — I509 Heart failure, unspecified: Secondary | ICD-10-CM | POA: Diagnosis not present

## 2018-06-03 DIAGNOSIS — E669 Obesity, unspecified: Secondary | ICD-10-CM

## 2018-06-03 DIAGNOSIS — M19011 Primary osteoarthritis, right shoulder: Secondary | ICD-10-CM | POA: Diagnosis not present

## 2018-06-03 DIAGNOSIS — D509 Iron deficiency anemia, unspecified: Secondary | ICD-10-CM

## 2018-06-03 DIAGNOSIS — E559 Vitamin D deficiency, unspecified: Secondary | ICD-10-CM

## 2018-06-03 DIAGNOSIS — E876 Hypokalemia: Secondary | ICD-10-CM | POA: Diagnosis not present

## 2018-06-03 DIAGNOSIS — E039 Hypothyroidism, unspecified: Secondary | ICD-10-CM | POA: Diagnosis not present

## 2018-06-03 DIAGNOSIS — I5032 Chronic diastolic (congestive) heart failure: Secondary | ICD-10-CM | POA: Diagnosis not present

## 2018-06-03 DIAGNOSIS — I4891 Unspecified atrial fibrillation: Secondary | ICD-10-CM | POA: Diagnosis not present

## 2018-06-03 MED ORDER — DILTIAZEM HCL ER COATED BEADS 120 MG PO CP24: 120.0000 mg | ORAL_CAPSULE | Freq: Every day | ORAL | 3 refills | Status: DC

## 2018-06-03 MED ORDER — DILTIAZEM HCL ER COATED BEADS 120 MG PO CP24: 120.0000 mg | ORAL_CAPSULE | Freq: Two times a day (BID) | ORAL | 3 refills | Status: DC

## 2018-06-03 NOTE — Patient Instructions (Signed)
Bradycardia, Adult °Bradycardia is a slower-than-normal heartbeat. A normal resting heart rate for an adult ranges from 60 to 100 beats per minute. With bradycardia, the resting heart rate is less than 60 beats per minute. °Bradycardia can prevent enough oxygen from reaching certain areas of your body when you are active. It can be serious if it keeps enough oxygen from reaching your brain and other parts of your body. Bradycardia is not a problem for everyone. For some healthy adults, a slow resting heart rate is normal. °What are the causes? °This condition may be caused by: °· A problem with the heart, including: °? A problem with the heart's electrical system, such as a heart block. °? A problem with the heart's natural pacemaker (sinus node). °? Heart disease. °? A heart attack. °? Heart damage. °? A heart infection. °? A heart condition that is present at birth (congenital heart defect). °· Certain medicines that treat heart conditions. °· Certain conditions, such as hypothyroidism and obstructive sleep apnea. °· Problems with the balance of chemicals and other substances, like potassium, in the blood. ° °What increases the risk? °This condition is more likely to develop in adults who: °· Are age 65 or older. °· Have high blood pressure (hypertension), high cholesterol (hyperlipidemia), or diabetes. °· Drink heavily, use tobacco or nicotine products, or use drugs. °· Are stressed. ° °What are the signs or symptoms? °Symptoms of this condition include: °· Light-headedness. °· Feeling faint or fainting. °· Fatigue and weakness. °· Shortness of breath. °· Chest pain (angina). °· Drowsiness. °· Confusion. °· Dizziness. ° °How is this diagnosed? °This condition may be diagnosed based on: °· Your symptoms. °· Your medical history. °· A physical exam. ° °During the exam, your health care provider will listen to your heartbeat and check your pulse. To confirm the diagnosis, your health care provider may order tests,  such as: °· Blood tests. °· An electrocardiogram (ECG). This test records the heart's electrical activity. The test can show how fast your heart is beating and whether the heartbeat is steady. °· A test in which you wear a portable device (event recorder or Holter monitor) to record your heart's electrical activity while you go about your day. °· An exercise test. ° °How is this treated? °Treatment for this condition depends on the cause of the condition and how severe your symptoms are. Treatment may involve: °· Treatment of the underlying condition. °· Changing your medicines or how much medicine you take. °· Having a small, battery-operated device called a pacemaker implanted under the skin. When bradycardia occurs, this device can be used to increase your heart rate and help your heart to beat in a regular rhythm. ° °Follow these instructions at home: °Lifestyle ° °· Manage any health conditions that contribute to bradycardia as told by your health care provider. °· Follow a heart-healthy diet. A nutrition specialist (dietitian) can help to educate you about healthy food options and changes. °· Follow an exercise program that is approved by your health care provider. °· Maintain a healthy weight. °· Try to reduce or manage your stress, such as with yoga or meditation. If you need help reducing stress, ask your health care provider. °· Do not use use any products that contain nicotine or tobacco, such as cigarettes and e-cigarettes. If you need help quitting, ask your health care provider. °· Do not use illegal drugs. °· Limit alcohol intake to no more than 1 drink per day for nonpregnant women and 2 drinks per   day for men. One drink equals 12 oz of beer, 5 oz of wine, or 1½ oz of hard liquor. °General instructions °· Take over-the-counter and prescription medicines only as told by your health care provider. °· Keep all follow-up visits as directed by your health care provider. This is important. °How is this  prevented? °In some cases, bradycardia may be prevented by: °· Treating underlying medical problems. °· Stopping behaviors or medicines that can trigger the condition. ° °Contact a health care provider if: °· You feel light-headed or dizzy. °· You almost faint. °· You feel weak or are easily fatigued during physical activity. °· You experience confusion or have memory problems. °Get help right away if: °· You faint. °· You have an irregular heartbeat (palpitations). °· You have chest pain. °· You have trouble breathing. °This information is not intended to replace advice given to you by your health care provider. Make sure you discuss any questions you have with your health care provider. °Document Released: 08/18/2002 Document Revised: 07/24/2016 Document Reviewed: 05/17/2016 °Elsevier Interactive Patient Education © 2017 Elsevier Inc. ° °

## 2018-06-03 NOTE — Progress Notes (Signed)
Subjective:    Patient ID: Katherine Tyler, female    DOB: 1929/02/27, 84 y.o.   MRN: [id number]  Chief Complaint  Patient presents with  . Hypertension    four month recheck  . Hypothyroidism   Pt presents to the office today for chronic follow up. Pt is followed by Cardiologists  For CHF and A Fib every 6 months. Pt is complaining of worsening fatigue. Her heart rate today is 53. Hypertension  This is a chronic problem. The current episode started more than 1 year ago. The problem has been resolved since onset. The problem is controlled. Associated symptoms include anxiety and malaise/fatigue. Pertinent negatives include no peripheral edema or shortness of breath. Risk factors for coronary artery disease include dyslipidemia and obesity. The current treatment provides moderate improvement. Hypertensive end-organ damage includes CAD/MI and heart failure. There is no history of kidney disease. Identifiable causes of hypertension include a thyroid problem.  Thyroid Problem  Presents for follow-up visit. Symptoms include anxiety and fatigue. Patient reports no depressed mood, diaphoresis or hoarse voice. The symptoms have been stable. Her past medical history is significant for heart failure.  Congestive Heart Failure  Presents for follow-up visit. Associated symptoms include fatigue. Pertinent negatives include no edema, nocturia or shortness of breath. The symptoms have been stable.  Arthritis  Presents for follow-up visit. She complains of pain and stiffness. The symptoms have been stable. Affected locations include the right knee. Her pain is at a severity of 3/10. Associated symptoms include fatigue.  Anxiety  Presents for follow-up visit. Symptoms include excessive worry and nervous/anxious behavior. Patient reports no depressed mood, irritability or shortness of breath. Symptoms occur most days. The severity of symptoms is moderate.        Review of Systems  Constitutional: Positive for  fatigue and malaise/fatigue. Negative for diaphoresis and irritability.  HENT: Negative for hoarse voice.   Respiratory: Negative for shortness of breath.   Genitourinary: Negative for nocturia.  Musculoskeletal: Positive for arthritis and stiffness.  Psychiatric/Behavioral: The patient is nervous/anxious.   All other systems reviewed and are negative.      Objective:   Physical Exam  Constitutional: She is oriented to person, place, and time. She appears well-developed and well-nourished. No distress.  HENT:  Head: Normocephalic and atraumatic.  Right Ear: External ear normal.  Left Ear: External ear normal.  Mouth/Throat: Oropharynx is clear and moist.  Eyes: Pupils are equal, round, and reactive to light.  Neck: Normal range of motion. Neck supple. No thyromegaly present.  Cardiovascular: Regular rhythm, normal heart sounds and intact distal pulses. Bradycardia present.  No murmur heard. Pulmonary/Chest: Effort normal and breath sounds normal. No respiratory distress. She has no wheezes.  Abdominal: Soft. Bowel sounds are normal. She exhibits no distension. There is no tenderness.  Musculoskeletal: She exhibits no edema or tenderness.  Pt using rolling walker  Neurological: She is alert and oriented to person, place, and time. She has normal reflexes. No cranial nerve deficit.  Skin: Skin is warm and dry.  Psychiatric: She has a normal mood and affect. Her behavior is normal. Judgment and thought content normal.  Vitals reviewed.     BP (!) 132/52   Pulse (!) 53   Temp (!) 97.5 F (36.4 C) (Oral)   Ht 5' 3"  (1.6 m)   Wt 218 lb (98.9 kg)   BMI 38.62 kg/m      Assessment & Plan:  Katherine Tyler comes in today with chief complaint of Hypertension (four  month recheck) and Hypothyroidism   Diagnosis and orders addressed:  1. Hypothyroidism, unspecified type - CMP14+EGFR - TSH  2. Essential hypertension - CMP14+EGFR  3. Primary osteoarthritis of right shoulder -  CMP14+EGFR  4. Vitamin D deficiency - CMP14+EGFR  5. GAD (generalized anxiety disorder) - LYG10+AJUM  6. Metabolic syndrome - FZF90+UHMW  7. Obesity (BMI 30-39.9) - CMP14+EGFR  8. Chronic diastolic heart failure (HCC) - CMP14+EGFR  9. Atrial fibrillation, unspecified type (HCC) -Will decrease diltiazem to 120 mg BID from 180 mg BID - CMP14+EGFR - diltiazem (DILTIAZEM CD) 120 MG 24 hr capsule; Take 1 capsule (120 mg total) by mouth 2 (two) times daily.  Dispense: 60 capsule; Refill: 3  10. Acute congestive heart failure, unspecified heart failure type (HCC) - CMP14+EGFR - diltiazem (DILTIAZEM CD) 120 MG 24 hr capsule; Take 1 capsule (120 mg total) by mouth 2 (two) times daily.  Dispense: 60 capsule; Refill: 3  11. Iron deficiency anemia, unspecified iron deficiency anemia type - CMP14+EGFR  12. Hypokalemia - CMP14+EGFR   Labs pending Health Maintenance reviewed Diet and exercise encouraged  Follow up plan: 2 weeks to recheck heart rate   Evelina Dun, FNP

## 2018-06-04 LAB — TSH: TSH: 10.19 u[IU]/mL — AB (ref 0.450–4.500)

## 2018-06-04 LAB — CMP14+EGFR
A/G RATIO: 2 (ref 1.2–2.2)
ALBUMIN: 4.1 g/dL (ref 3.5–4.7)
ALK PHOS: 62 IU/L (ref 39–117)
ALT: 10 IU/L (ref 0–32)
AST: 14 IU/L (ref 0–40)
BILIRUBIN TOTAL: 0.3 mg/dL (ref 0.0–1.2)
BUN/Creatinine Ratio: 24 (ref 12–28)
BUN: 33 mg/dL — ABNORMAL HIGH (ref 8–27)
CALCIUM: 9.3 mg/dL (ref 8.7–10.3)
CO2: 23 mmol/L (ref 20–29)
CREATININE: 1.39 mg/dL — AB (ref 0.57–1.00)
Chloride: 96 mmol/L (ref 96–106)
GFR calc Af Amer: 39 mL/min/{1.73_m2} — ABNORMAL LOW (ref >59)
GFR calc non Af Amer: 34 mL/min/{1.73_m2} — ABNORMAL LOW (ref >59)
Globulin, Total: 2.1 g/dL (ref 1.5–4.5)
Glucose: 83 mg/dL (ref 65–99)
POTASSIUM: 5.3 mmol/L — AB (ref 3.5–5.2)
SODIUM: 131 mmol/L — AB (ref 134–144)
TOTAL PROTEIN: 6.2 g/dL (ref 6.0–8.5)

## 2018-06-05 ENCOUNTER — Other Ambulatory Visit: Payer: Self-pay | Admitting: Family

## 2018-06-05 MED ORDER — LEVOTHYROXINE SODIUM 125 MCG PO TABS: 125.0000 ug | ORAL_TABLET | Freq: Every day | ORAL | 1 refills | Status: DC

## 2018-06-17 ENCOUNTER — Encounter: Payer: Self-pay | Admitting: Family

## 2018-06-17 ENCOUNTER — Ambulatory Visit (INDEPENDENT_AMBULATORY_CARE_PROVIDER_SITE_OTHER): Payer: Medicare Other | Admitting: Family

## 2018-06-17 VITALS — BP 153/65 | HR 54 | Temp 98.3°F | Ht 63.0 in | Wt 211.0 lb

## 2018-06-17 DIAGNOSIS — E039 Hypothyroidism, unspecified: Secondary | ICD-10-CM | POA: Diagnosis not present

## 2018-06-17 DIAGNOSIS — R5383 Other fatigue: Secondary | ICD-10-CM | POA: Diagnosis not present

## 2018-06-17 DIAGNOSIS — I10 Essential (primary) hypertension: Secondary | ICD-10-CM | POA: Diagnosis not present

## 2018-06-17 DIAGNOSIS — R001 Bradycardia, unspecified: Secondary | ICD-10-CM | POA: Diagnosis not present

## 2018-06-17 LAB — BMP8+EGFR
BUN / CREAT RATIO: 20 (ref 12–28)
BUN: 25 mg/dL (ref 8–27)
CO2: 23 mmol/L (ref 20–29)
Calcium: 9.7 mg/dL (ref 8.7–10.3)
Chloride: 94 mmol/L — ABNORMAL LOW (ref 96–106)
Creatinine, Ser: 1.24 mg/dL — ABNORMAL HIGH (ref 0.57–1.00)
GFR calc Af Amer: 45 mL/min/{1.73_m2} — ABNORMAL LOW (ref >59)
GFR calc non Af Amer: 39 mL/min/{1.73_m2} — ABNORMAL LOW (ref >59)
GLUCOSE: 88 mg/dL (ref 65–99)
POTASSIUM: 4.7 mmol/L (ref 3.5–5.2)
SODIUM: 130 mmol/L — AB (ref 134–144)

## 2018-06-17 NOTE — Progress Notes (Signed)
   Subjective:    Patient ID: Katherine Tyler, female    DOB: [birth year], 84 y.o.   MRN: [id number]  Chief Complaint  Patient presents with  . Bradycardia   Pt presents to the office today to recheck Bradycardia and fatigue. Pt was seen in the office on 06/03/18 with heart in low 50's. She was complaining of worsening fatigue. We decreased her diltiazem to 120 mg from 180 mg.  [name] BP is elevated today and she is still complaining of fatigue.  However, she states she takes her BP at home every day and her BP has been 150's/60's.    Her TSH was elevated and we increased her levothyroxine to 125 mcg.  Hypertension  This is a chronic problem. The current episode started more than 1 year ago. The problem has been waxing and waning since onset. The problem is uncontrolled. Associated symptoms include malaise/fatigue and peripheral edema. Pertinent negatives include no headaches or shortness of breath. The current treatment provides moderate improvement. Hypertensive end-organ damage includes CAD/MI and heart failure.      Review of Systems  Constitutional: Positive for fatigue and malaise/fatigue.  Respiratory: Negative for shortness of breath.   Neurological: Negative for headaches.  All other systems reviewed and are negative.      Objective:   Physical Exam  Constitutional: She is oriented to person, place, and time. She appears well-developed and well-nourished. No distress.  HENT:  Head: Normocephalic and atraumatic.  Eyes: Pupils are equal, round, and reactive to light.  Neck: Normal range of motion. Neck supple. No thyromegaly present.  Cardiovascular: Normal rate, regular rhythm, normal heart sounds and intact distal pulses.  No murmur heard. Pulmonary/Chest: Effort normal and breath sounds normal. No respiratory distress. She has no wheezes.  Abdominal: Soft. Bowel sounds are normal. She exhibits no distension. There is no tenderness.  Musculoskeletal: Normal range of motion. She  exhibits no edema or tenderness.  Neurological: She is alert and oriented to person, place, and time. She has normal reflexes. No cranial nerve deficit.  Skin: Skin is warm and dry.  Psychiatric: She has a normal mood and affect. Her behavior is normal. Judgment and thought content normal.  Vitals reviewed.     BP (!) 183/74   Pulse (!) 57   Temp 98.3 F (36.8 C) (Oral)   Ht _0  (1.6 m)   Wt 211 lb (95.7 kg)   BMI 37.38 kg/m      Assessment & Plan:  Katherine Tyler comes in today with chief complaint of Bradycardia   Diagnosis and orders addressed:  1. Essential hypertension Will keep medications the same at this time -Daily blood pressure log given with instructions on how to fill out and told to bring to next visit -Dash diet information given -Exercise encouraged - Stress Management  -Continue current meds - BMP8+EGFR  2. Bradycardia - BMP8+EGFR  3. Fatigue, unspecified type May be related to thyroid, recheck in 2 months - BMP8+EGFR  4. Hypothyroidism, unspecified type - BMP8+EGFR   Labs pending Diet and exercise encouraged  Follow up plan: 2 months for TSH and follow up   Evelina Dun, FNP

## 2018-06-17 NOTE — Patient Instructions (Signed)

## 2018-07-03 DIAGNOSIS — H34831 Tributary (branch) retinal vein occlusion, right eye, with macular edema: Secondary | ICD-10-CM | POA: Diagnosis not present

## 2018-07-03 DIAGNOSIS — H43813 Vitreous degeneration, bilateral: Secondary | ICD-10-CM | POA: Diagnosis not present

## 2018-07-03 DIAGNOSIS — H3563 Retinal hemorrhage, bilateral: Secondary | ICD-10-CM | POA: Diagnosis not present

## 2018-07-03 DIAGNOSIS — H353132 Nonexudative age-related macular degeneration, bilateral, intermediate dry stage: Secondary | ICD-10-CM | POA: Diagnosis not present

## 2018-07-07 ENCOUNTER — Other Ambulatory Visit: Payer: Self-pay | Admitting: Family

## 2018-07-07 DIAGNOSIS — F411 Generalized anxiety disorder: Secondary | ICD-10-CM

## 2018-07-08 DIAGNOSIS — H34831 Tributary (branch) retinal vein occlusion, right eye, with macular edema: Secondary | ICD-10-CM | POA: Diagnosis not present

## 2018-07-23 DIAGNOSIS — H34831 Tributary (branch) retinal vein occlusion, right eye, with macular edema: Secondary | ICD-10-CM | POA: Diagnosis not present

## 2018-08-05 ENCOUNTER — Telehealth: Payer: Self-pay | Admitting: Family

## 2018-08-05 NOTE — Telephone Encounter (Signed)
[facility] had already filled script.

## 2018-08-12 ENCOUNTER — Ambulatory Visit (INDEPENDENT_AMBULATORY_CARE_PROVIDER_SITE_OTHER): Payer: Medicare Other | Admitting: Cardiology

## 2018-08-12 ENCOUNTER — Encounter: Payer: Self-pay | Admitting: Cardiology

## 2018-08-12 VITALS — BP 126/76 | HR 72 | Ht 62.0 in | Wt 216.0 lb

## 2018-08-12 DIAGNOSIS — I5032 Chronic diastolic (congestive) heart failure: Secondary | ICD-10-CM | POA: Diagnosis not present

## 2018-08-12 DIAGNOSIS — I10 Essential (primary) hypertension: Secondary | ICD-10-CM | POA: Diagnosis not present

## 2018-08-12 DIAGNOSIS — I480 Paroxysmal atrial fibrillation: Secondary | ICD-10-CM

## 2018-08-12 DIAGNOSIS — Z79899 Other long term (current) drug therapy: Secondary | ICD-10-CM | POA: Diagnosis not present

## 2018-08-12 DIAGNOSIS — E039 Hypothyroidism, unspecified: Secondary | ICD-10-CM | POA: Diagnosis not present

## 2018-08-12 NOTE — Patient Instructions (Addendum)
Your physician wants you to follow-up in: 6 months with Dr.McDowell You will receive a reminder letter in the mail two months in advance. If you don't receive a letter, please call our office to schedule the follow-up appointment.      Get labs JUST BEFORE NEXT VISIT: cbc,bmet    Your physician recommends that you continue on your current medications as directed. Please refer to the Current Medication list given to you today.      If you need a refill on your cardiac medications before your next appointment, please call your pharmacy.       No labs or tests today        Thank you for choosing [facility] !

## 2018-08-12 NOTE — Progress Notes (Signed)
Cardiology Office Note  Date: 08/12/2018   ID: Katherine Tyler, DOB [birth year], MRN [id number]  PCP: Junie Spencer, FNP  Primary Cardiologist: Nona Dell, MD   Chief Complaint  Patient presents with  . PAF    History of Present Illness: Katherine Tyler is an 84 y.o. female last seen in April.  She is here today with a family member for a routine visit.  She does not report any significant palpitations, chest pain, or unusual shortness of breath since her last visit.  She has not been very active over the summer, but does enjoy cooking a large dinner for her family periodically.  I reviewed her medications.  She continues on combination of metoprolol and Cardizem CD for heart rate control.  She is tolerating Eliquis for stroke prophylaxis.  I reviewed her interval lab work.  She does not report any obvious bleeding problems, no changes in stool.  Past Medical History:  Diagnosis Date  . Anxiety   . Arthritis   . GERD (gastroesophageal reflux disease)   . Hiatal hernia   . Hyperlipidemia   . Hypertension   . Hypothyroidism   . Obesity   . PAF (paroxysmal atrial fibrillation) (HCC)    a. diagnosed in 01/2018. Started on Eliquis for anticoagulation.   . Vitamin D deficiency     Past Surgical History:  Procedure Laterality Date  . CHOLECYSTECTOMY    . TUBAL LIGATION  1960    Current Outpatient Medications  Medication Sig Dispense Refill  . apixaban (ELIQUIS) 5 MG TABS tablet Take 1 tablet (5 mg total) by mouth 2 (two) times daily. 60 tablet 6  . Cholecalciferol (D3-1000) 1000 UNITS capsule Take 1,000 Units by mouth daily.     . citalopram (CELEXA) 20 MG tablet TAKE ONE TABLET BY MOUTH EVERY DAY. 90 tablet 0  . diltiazem (DILTIAZEM CD) 120 MG 24 hr capsule Take 1 capsule (120 mg total) by mouth 2 (two) times daily. 60 capsule 3  . Ferrous Sulfate Dried (SLOW RELEASE IRON) 45 MG TBCR Take 45 mg by mouth 2 (two) times daily.     . furosemide (LASIX) 20 MG tablet Take 1 tablet (20  mg total) by mouth every other day. 45 tablet 3  . levothyroxine (SYNTHROID) 125 MCG tablet Take 1 tablet (125 mcg total) by mouth daily. 90 tablet 1  . losartan (COZAAR) 100 MG tablet TAKE ONE TABLET BY MOUTH EVERY DAY 90 tablet 1  . magnesium oxide (MAG-OX) 400 (241.3 MG) MG tablet Take 1 tablet (400 mg total) by mouth daily. 30 tablet 0  . metoprolol succinate (TOPROL XL) 25 MG 24 hr tablet Take 1 tablet (25 mg total) by mouth daily. 90 tablet 3  . nystatin (MYCOSTATIN/NYSTOP) powder Apply topically 4 (four) times daily. 15 g 0  . thiamine (VITAMIN B-1) 100 MG tablet Take 100 mg by mouth daily.     No current facility-administered medications for this visit.    Allergies:  Terazosin hcl and Keflex [cephalexin]   Social History: The patient  reports that she has never smoked. She quit smokeless tobacco use about 61 years ago.  Her smokeless tobacco use included snuff. She reports that she does not drink alcohol or use drugs.   ROS:  Please see the history of present illness. Otherwise, complete review of systems is positive for hearing loss.  All other systems are reviewed and negative.   Physical Exam: VS:  BP 126/76 (BP Location: Left Arm)   Pulse 72  Ht 5\' 2"  (1.575 m)   Wt 216 lb (98 kg)   SpO2 98%   BMI 39.51 kg/m , BMI Body mass index is 39.51 kg/m.  Wt Readings from Last 3 Encounters:  08/12/18 216 lb (98 kg)  06/17/18 211 lb (95.7 kg)  06/03/18 218 lb (98.9 kg)    General: Elderly woman, appears comfortable at rest. HEENT: Conjunctiva and lids normal, oropharynx clear. Neck: Supple, no elevated JVP or carotid bruits, no thyromegaly. Lungs: Clear to auscultation, nonlabored breathing at rest. Cardiac: Regular rate and rhythm, no S3, soft systolic murmur. Abdomen: Soft, nontender, bowel sounds present. Extremities: No pitting edema, distal pulses 2+. Skin: Warm and dry. Musculoskeletal: No kyphosis. Neuropsychiatric: Alert and oriented x3, affect grossly  appropriate.  ECG: I personally reviewed the tracing from 01/17/2018 which showed sinus rhythm with prolonged PR interval and increased voltage, incomplete right bundle branch block.  Recent Labwork: 01/16/2018: Magnesium 1.8 01/18/2018: B Natriuretic Peptide 445.0 02/03/2018: Hemoglobin 11.6; Platelets 201 06/03/2018: ALT 10; AST 14; TSH 10.190 06/17/2018: BUN 25; Creatinine, Ser 1.24; Potassium 4.7; Sodium 130     Component Value Date/Time   CHOL 154 04/05/2014 1051   CHOL 168 04/16/2013 1155   TRIG 93 04/05/2014 1051   TRIG 85 04/16/2013 1155   HDL 59 04/05/2014 1051   HDL 58 04/16/2013 1155   LDLCALC 76 04/05/2014 1051   LDLCALC 93 04/16/2013 1155    Other Studies Reviewed Today:  Echocardiogram 01/15/2018: Study Conclusions  - Left ventricle: The cavity size was normal. Wall thickness was   increased in a pattern of mild LVH. Systolic function was normal.   The estimated ejection fraction was in the range of 60% to 65%.   Wall motion was normal; there were no regional wall motion   abnormalities. Features are consistent with a pseudonormal left   ventricular filling pattern, with concomitant abnormal relaxation   and increased filling pressure (grade 2 diastolic dysfunction).   Doppler parameters are consistent with high ventricular filling   pressure. - Mitral valve: Calcified annulus. There was moderate   regurgitation. - Left atrium: The atrium was mildly dilated. - Tricuspid valve: There was moderate regurgitation. - Pulmonary arteries: PA peak pressure: 52 mm Hg (S).  Assessment and Plan:  1.  Paroxysmal atrial fibrillation.  She is doing well without recurrent palpitations and continues on combination of Toprol-XL and Cardizem CD as well as stroke prophylaxis on Eliquis.  I reviewed her interval lab work.  No changes were made today.  2.  Chronic diastolic heart failure.  She does not report any increasing dyspnea on exertion, weight has been relatively stable,  fluctuating within a 5 pound range.  He is tolerating current dose of low-dose Lasix with stable, mild hyponatremia.  3.  Essential hypertension, blood pressure is well controlled today.  Keep follow-up with PCP.  4.  Hypothyroidism, continues on Synthroid with follow-up per PCP.  Recent TSH 10.19.  Current medicines were reviewed with the patient today.   Orders Placed This Encounter  Procedures  . CBC  . Basic Metabolic Panel (BMET)    Disposition: Follow-up in 6 months.  Signed, Jonelle Sidle, MD, [facility] 08/12/2018 1:10 PM    [facility] at [facility] [address] [place], [place] [address] Phone: [phone]; Fax: [phone]

## 2018-08-18 ENCOUNTER — Ambulatory Visit (INDEPENDENT_AMBULATORY_CARE_PROVIDER_SITE_OTHER): Payer: Medicare Other | Admitting: Family

## 2018-08-18 ENCOUNTER — Encounter: Payer: Self-pay | Admitting: Family

## 2018-08-18 VITALS — BP 175/73 | HR 63 | Temp 97.1°F | Ht 62.0 in | Wt 217.4 lb

## 2018-08-18 DIAGNOSIS — E039 Hypothyroidism, unspecified: Secondary | ICD-10-CM

## 2018-08-18 DIAGNOSIS — I509 Heart failure, unspecified: Secondary | ICD-10-CM

## 2018-08-18 DIAGNOSIS — D509 Iron deficiency anemia, unspecified: Secondary | ICD-10-CM | POA: Diagnosis not present

## 2018-08-18 DIAGNOSIS — I4891 Unspecified atrial fibrillation: Secondary | ICD-10-CM

## 2018-08-18 DIAGNOSIS — F411 Generalized anxiety disorder: Secondary | ICD-10-CM | POA: Diagnosis not present

## 2018-08-18 DIAGNOSIS — R6889 Other general symptoms and signs: Secondary | ICD-10-CM | POA: Diagnosis not present

## 2018-08-18 DIAGNOSIS — I10 Essential (primary) hypertension: Secondary | ICD-10-CM

## 2018-08-18 DIAGNOSIS — E669 Obesity, unspecified: Secondary | ICD-10-CM | POA: Diagnosis not present

## 2018-08-18 MED ORDER — APIXABAN 5 MG PO TABS: 5.0000 mg | ORAL_TABLET | Freq: Two times a day (BID) | ORAL | 6 refills | Status: DC

## 2018-08-18 MED ORDER — FERROUS SULFATE DRIED ER 45 MG PO TBCR: 45.0000 mg | EXTENDED_RELEASE_TABLET | Freq: Two times a day (BID) | ORAL | 2 refills | Status: AC

## 2018-08-18 MED ORDER — DILTIAZEM HCL ER COATED BEADS 120 MG PO CP24: 120.0000 mg | ORAL_CAPSULE | Freq: Two times a day (BID) | ORAL | 3 refills | Status: DC

## 2018-08-18 MED ORDER — METOPROLOL SUCCINATE ER 25 MG PO TB24: 25.0000 mg | ORAL_TABLET | Freq: Every day | ORAL | 3 refills | Status: DC

## 2018-08-18 MED ORDER — FUROSEMIDE 20 MG PO TABS: 20.0000 mg | ORAL_TABLET | ORAL | 3 refills | Status: DC

## 2018-08-18 MED ORDER — LOSARTAN POTASSIUM 100 MG PO TABS: 100.0000 mg | ORAL_TABLET | Freq: Every day | ORAL | 3 refills | Status: DC

## 2018-08-18 MED ORDER — CITALOPRAM HYDROBROMIDE 20 MG PO TABS: 20.0000 mg | ORAL_TABLET | Freq: Every day | ORAL | 1 refills | Status: DC

## 2018-08-18 MED ORDER — LEVOTHYROXINE SODIUM 125 MCG PO TABS: 125.0000 ug | ORAL_TABLET | Freq: Every day | ORAL | 1 refills | Status: DC

## 2018-08-18 NOTE — Patient Instructions (Signed)
Hypothyroidism Hypothyroidism is a disorder of the thyroid. The thyroid is a large gland that is located in the lower front of the neck. The thyroid releases hormones that control how the body works. With hypothyroidism, the thyroid does not make enough of these hormones. What are the causes? Causes of hypothyroidism may include:  Viral infections.  Pregnancy.  Your own defense system (immune system) attacking your thyroid.  Certain medicines.  Birth defects.  Past radiation treatments to your head or neck.  Past treatment with radioactive iodine.  Past surgical removal of part or all of your thyroid.  Problems with the gland that is located in the center of your brain (pituitary).  What are the signs or symptoms? Signs and symptoms of hypothyroidism may include:  Feeling as though you have no energy (lethargy).  Inability to tolerate cold.  Weight gain that is not explained by a change in diet or exercise habits.  Dry skin.  Coarse hair.  Menstrual irregularity.  Slowing of thought processes.  Constipation.  Sadness or depression.  How is this diagnosed? Your health care provider may diagnose hypothyroidism with blood tests and ultrasound tests. How is this treated? Hypothyroidism is treated with medicine that replaces the hormones that your body does not make. After you begin treatment, it may take several weeks for symptoms to go away. Follow these instructions at home:  Take medicines only as directed by your health care provider.  If you start taking any new medicines, tell your health care provider.  Keep all follow-up visits as directed by your health care provider. This is important. As your condition improves, your dosage needs may change. You will need to have blood tests regularly so that your health care provider can watch your condition. Contact a health care provider if:  Your symptoms do not get better with treatment.  You are taking thyroid  replacement medicine and: ? You sweat excessively. ? You have tremors. ? You feel anxious. ? You lose weight rapidly. ? You cannot tolerate heat. ? You have emotional swings. ? You have diarrhea. ? You feel weak. Get help right away if:  You develop chest pain.  You develop an irregular heartbeat.  You develop a rapid heartbeat. This information is not intended to replace advice given to you by your health care provider. Make sure you discuss any questions you have with your health care provider. Document Released: 11/26/2005 Document Revised: 05/03/2016 Document Reviewed: 04/13/2014 Elsevier Interactive Patient Education  2018 [employer]  

## 2018-08-18 NOTE — Progress Notes (Signed)
Subjective:    Patient ID: Katherine Tyler, female    DOB: 1929-12-03, 84 y.o.   MRN: [id number]  Chief Complaint  Patient presents with  . Hypertension   Pt presents to the office today to recheck hypothyroidism and hypertension. She states her pharmacy closed down and needs a refill on all of her medications sent to [facility].    Pt is followed by Cardiologists for CHF and A Fib every 6 months.  Hypertension  This is a chronic problem. The current episode started more than 1 year ago. The problem has been waxing and waning since onset. The problem is uncontrolled. Associated symptoms include anxiety. Pertinent negatives include no headaches, malaise/fatigue, peripheral edema or shortness of breath. Risk factors for coronary artery disease include dyslipidemia, obesity and sedentary lifestyle. Past treatments include angiotensin blockers. The current treatment provides moderate improvement. Hypertensive end-organ damage includes CAD/MI and heart failure. Identifiable causes of hypertension include a thyroid problem.  Thyroid Problem  Presents for follow-up visit. Symptoms include anxiety. Patient reports no constipation, depressed mood, dry skin, fatigue or hoarse voice. The symptoms have been stable. Her past medical history is significant for heart failure.  Anxiety  Presents for follow-up visit. Symptoms include excessive worry and nervous/anxious behavior. Patient reports no depressed mood, irritability or shortness of breath. Symptoms occur occasionally. The severity of symptoms is mild.   Her past medical history is significant for anemia.  Congestive Heart Failure  Presents for follow-up visit. Pertinent negatives include no fatigue, nocturia, shortness of breath or unexpected weight change. The symptoms have been stable.  Anemia  Presents for follow-up visit. Symptoms include bruises/bleeds easily. There has been no leg swelling or malaise/fatigue. Past medical history includes heart  failure.      Review of Systems  Constitutional: Negative for fatigue, irritability, malaise/fatigue and unexpected weight change.  HENT: Negative for hoarse voice.   Respiratory: Negative for shortness of breath.   Gastrointestinal: Negative for constipation.  Genitourinary: Negative for nocturia.  Neurological: Negative for headaches.  Hematological: Bruises/bleeds easily.  Psychiatric/Behavioral: The patient is nervous/anxious.   All other systems reviewed and are negative.      Objective:   Physical Exam  Constitutional: She is oriented to person, place, and time. She appears well-developed and well-nourished. No distress.  HENT:  Head: Normocephalic and atraumatic.  Right Ear: External ear normal.  Left Ear: External ear normal.  Mouth/Throat: Oropharynx is clear and moist.  Eyes: Pupils are equal, round, and reactive to light.  Neck: Normal range of motion. Neck supple. No thyromegaly present.  Cardiovascular: Normal rate, regular rhythm and intact distal pulses.  Murmur heard. Pulmonary/Chest: Effort normal and breath sounds normal. No respiratory distress. She has no wheezes.  Abdominal: Soft. Bowel sounds are normal. She exhibits no distension. There is no tenderness.  Musculoskeletal: Normal range of motion. She exhibits edema. She exhibits no tenderness (trace BLE).  Neurological: She is alert and oriented to person, place, and time. She has normal reflexes. No cranial nerve deficit.  Skin: Skin is warm and dry. Ecchymosis (right forearm) noted.  Psychiatric: She has a normal mood and affect. Her behavior is normal. Judgment and thought content normal.  Vitals reviewed.     BP (!) 175/73   Pulse 63   Temp (!) 97.1 F (36.2 C) (Oral)   Ht 5' 2"  (1.575 m)   Wt 217 lb 6.4 oz (98.6 kg)   BMI 39.76 kg/m      Assessment & Plan:  Katherine Tyler comes  in today with chief complaint of Hypertension   Diagnosis and orders addressed:  1. Essential hypertension -  losartan (COZAAR) 100 MG tablet; Take 1 tablet (100 mg total) by mouth daily.  Dispense: 90 tablet; Refill: 3 - CMP14+EGFR  2. Hypothyroidism, unspecified type - CMP14+EGFR - TSH  3. Atrial fibrillation, unspecified type (HCC) - CMP14+EGFR - diltiazem (DILTIAZEM CD) 120 MG 24 hr capsule; Take 1 capsule (120 mg total) by mouth 2 (two) times daily.  Dispense: 180 capsule; Refill: 3  4. Acute congestive heart failure, unspecified heart failure type (HCC) - CMP14+EGFR - diltiazem (DILTIAZEM CD) 120 MG 24 hr capsule; Take 1 capsule (120 mg total) by mouth 2 (two) times daily.  Dispense: 180 capsule; Refill: 3  5. GAD (generalized anxiety disorder) - CMP14+EGFR - citalopram (CELEXA) 20 MG tablet; Take 1 tablet (20 mg total) by mouth daily.  Dispense: 90 tablet; Refill: 1  6. Iron deficiency anemia, unspecified iron deficiency anemia type - CMP14+EGFR - Anemia Profile B  7. Obesity (BMI 30-39.9) - CMP14+EGFR   Labs pending Health Maintenance reviewed Diet and exercise encouraged  Follow up plan: 3 months    Evelina Dun, FNP

## 2018-08-19 ENCOUNTER — Other Ambulatory Visit: Payer: Self-pay | Admitting: Family

## 2018-08-19 MED ORDER — LEVOTHYROXINE SODIUM 150 MCG PO TABS: 150.0000 ug | ORAL_TABLET | Freq: Every day | ORAL | 2 refills | Status: DC

## 2018-08-20 LAB — CMP14+EGFR
A/G RATIO: 1.5 (ref 1.2–2.2)
ALK PHOS: 65 IU/L (ref 39–117)
ALT: 10 IU/L (ref 0–32)
AST: 17 IU/L (ref 0–40)
Albumin: 4 g/dL (ref 3.5–4.7)
BILIRUBIN TOTAL: 0.3 mg/dL (ref 0.0–1.2)
BUN/Creatinine Ratio: 25 (ref 12–28)
BUN: 33 mg/dL — ABNORMAL HIGH (ref 8–27)
CALCIUM: 9.6 mg/dL (ref 8.7–10.3)
CO2: 21 mmol/L (ref 20–29)
Chloride: 97 mmol/L (ref 96–106)
Creatinine, Ser: 1.32 mg/dL — ABNORMAL HIGH (ref 0.57–1.00)
GFR calc Af Amer: 42 mL/min/{1.73_m2} — ABNORMAL LOW (ref >59)
GFR, EST NON AFRICAN AMERICAN: 36 mL/min/{1.73_m2} — AB (ref >59)
GLOBULIN, TOTAL: 2.6 g/dL (ref 1.5–4.5)
Glucose: 86 mg/dL (ref 65–99)
POTASSIUM: 4.7 mmol/L (ref 3.5–5.2)
SODIUM: 134 mmol/L (ref 134–144)
Total Protein: 6.6 g/dL (ref 6.0–8.5)

## 2018-08-20 LAB — ANEMIA PROFILE B
Basophils Absolute: 0 10*3/uL (ref 0.0–0.2)
Basos: 0 %
EOS (ABSOLUTE): 0.1 10*3/uL (ref 0.0–0.4)
EOS: 2 %
Ferritin: 88 ng/mL (ref 15–150)
Folate: >20 ng/mL (ref >3.0)
HEMOGLOBIN: 10 g/dL — AB (ref 11.1–15.9)
Hematocrit: 30.8 % — ABNORMAL LOW (ref 34.0–46.6)
IMMATURE GRANS (ABS): 0 10*3/uL (ref 0.0–0.1)
IMMATURE GRANULOCYTES: 0 %
Iron Saturation: 17 % (ref 15–55)
Iron: 57 ug/dL (ref 27–139)
LYMPHS: 15 %
Lymphocytes Absolute: 0.8 10*3/uL (ref 0.7–3.1)
MCH: 31.4 pg (ref 26.6–33.0)
MCHC: 32.5 g/dL (ref 31.5–35.7)
MCV: 97 fL (ref 79–97)
MONOS ABS: 0.5 10*3/uL (ref 0.1–0.9)
Monocytes: 9 %
NEUTROS PCT: 74 %
Neutrophils Absolute: 3.9 10*3/uL (ref 1.4–7.0)
Platelets: 175 10*3/uL (ref 150–450)
RBC: 3.18 x10E6/uL — AB (ref 3.77–5.28)
RDW: 14.3 % (ref 12.3–15.4)
Retic Ct Pct: 2.3 % (ref 0.6–2.6)
Total Iron Binding Capacity: 328 ug/dL (ref 250–450)
UIBC: 271 ug/dL (ref 118–369)
Vitamin B-12: 333 pg/mL (ref 232–1245)
WBC: 5.3 10*3/uL (ref 3.4–10.8)

## 2018-08-20 LAB — SPECIMEN STATUS REPORT

## 2018-08-20 LAB — TSH: TSH: 5.46 u[IU]/mL — AB (ref 0.450–4.500)

## 2018-08-27 ENCOUNTER — Other Ambulatory Visit: Payer: Self-pay | Admitting: Family

## 2018-08-27 DIAGNOSIS — I4891 Unspecified atrial fibrillation: Secondary | ICD-10-CM

## 2018-08-27 DIAGNOSIS — I509 Heart failure, unspecified: Secondary | ICD-10-CM

## 2018-09-03 DIAGNOSIS — H34831 Tributary (branch) retinal vein occlusion, right eye, with macular edema: Secondary | ICD-10-CM | POA: Diagnosis not present

## 2018-10-20 ENCOUNTER — Encounter: Payer: Medicare Other | Admitting: *Deleted

## 2018-10-23 ENCOUNTER — Ambulatory Visit: Payer: Medicare Other | Admitting: Family

## 2018-10-23 ENCOUNTER — Encounter: Payer: Medicare Other | Admitting: *Deleted

## 2018-11-12 ENCOUNTER — Other Ambulatory Visit: Payer: Self-pay | Admitting: Cardiology

## 2018-11-12 MED ORDER — APIXABAN 5 MG PO TABS: 5.0000 mg | ORAL_TABLET | Freq: Two times a day (BID) | ORAL | 6 refills | Status: DC

## 2018-11-12 NOTE — Telephone Encounter (Signed)
SENT 

## 2018-11-12 NOTE — Telephone Encounter (Signed)
apixaban (ELIQUIS) 5 MG TABS tablet [[id number]][[id number]]  [facility]

## 2018-11-20 ENCOUNTER — Ambulatory Visit (INDEPENDENT_AMBULATORY_CARE_PROVIDER_SITE_OTHER): Payer: Medicare Other | Admitting: Family

## 2018-11-20 ENCOUNTER — Encounter: Payer: Self-pay | Admitting: Family

## 2018-11-20 VITALS — BP 151/63 | HR 51 | Temp 97.2°F | Ht 62.0 in | Wt 217.2 lb

## 2018-11-20 DIAGNOSIS — M19011 Primary osteoarthritis, right shoulder: Secondary | ICD-10-CM

## 2018-11-20 DIAGNOSIS — F411 Generalized anxiety disorder: Secondary | ICD-10-CM | POA: Diagnosis not present

## 2018-11-20 DIAGNOSIS — I509 Heart failure, unspecified: Secondary | ICD-10-CM | POA: Diagnosis not present

## 2018-11-20 DIAGNOSIS — D509 Iron deficiency anemia, unspecified: Secondary | ICD-10-CM

## 2018-11-20 DIAGNOSIS — E669 Obesity, unspecified: Secondary | ICD-10-CM | POA: Diagnosis not present

## 2018-11-20 DIAGNOSIS — I10 Essential (primary) hypertension: Secondary | ICD-10-CM

## 2018-11-20 DIAGNOSIS — E039 Hypothyroidism, unspecified: Secondary | ICD-10-CM

## 2018-11-20 DIAGNOSIS — Z23 Encounter for immunization: Secondary | ICD-10-CM | POA: Diagnosis not present

## 2018-11-20 MED ORDER — METOPROLOL SUCCINATE ER 25 MG PO TB24: 25.0000 mg | ORAL_TABLET | Freq: Every day | ORAL | 3 refills | Status: DC

## 2018-11-20 NOTE — Patient Instructions (Signed)
Heart Failure Heart failure is a condition in which the heart has trouble pumping blood because it has become weak or stiff. This means that the heart does not pump blood efficiently for the body to work well. For some people with heart failure, fluid may back up into the lungs and there may be swelling (edema) in the lower legs. Heart failure is usually a long-term (chronic) condition. It is important for you to take good care of yourself and follow the treatment plan from your health care provider. What are the causes? This condition is caused by some health problems, including:  High blood pressure (hypertension). Hypertension causes the heart muscle to work harder than normal. High blood pressure eventually causes the heart to become stiff and weak.  Coronary artery disease (CAD). CAD is the buildup of cholesterol and fat (plaques) in the arteries of the heart.  Heart attack (myocardial infarction). Injured tissue, which is caused by the heart attack, does not contract as well and the heart's ability to pump blood is weakened.  Abnormal heart valves. When the heart valves do not open and close properly, the heart muscle must pump harder to keep the blood flowing.  Heart muscle disease (cardiomyopathy or myocarditis). Heart muscle disease is damage to the heart muscle from a variety of causes, such as drug or alcohol abuse, infections, or unknown causes. These can increase the risk of heart failure.  Lung disease. When the lungs do not work properly, the heart must work harder.  What increases the risk? Risk of heart failure increases as a person ages. This condition is also more likely to develop in people who:  Are overweight.  Are female.  Smoke or chew tobacco.  Abuse alcohol or illegal drugs.  Have taken medicines that can damage the heart, such as chemotherapy drugs.  Have diabetes. ? High blood sugar (glucose) is associated with high fat (lipid) levels in the blood. ? Diabetes  can also damage tiny blood vessels that carry nutrients to the heart muscle.  Have abnormal heart rhythms.  Have thyroid problems.  Have low blood counts (anemia).  What are the signs or symptoms? Symptoms of this condition include:  Shortness of breath with activity, such as when climbing stairs.  Persistent cough.  Swelling of the feet, ankles, legs, or abdomen.  Unexplained weight gain.  Difficulty breathing when lying flat (orthopnea).  Waking from sleep because of the need to sit up and get more air.  Rapid heartbeat.  Fatigue and loss of energy.  Feeling light-headed, dizzy, or close to fainting.  Loss of appetite.  Nausea.  Increased urination during the night (nocturia).  Confusion.  How is this diagnosed? This condition is diagnosed based on:  Medical history, symptoms, and a physical exam.  Diagnostic tests, which may include: ? Echocardiogram. ? Electrocardiogram (ECG). ? Chest X-ray. ? Blood tests. ? Exercise stress test. ? Radionuclide scans. ? Cardiac catheterization and angiogram.  How is this treated? Treatment for this condition is aimed at managing the symptoms of heart failure. Medicines, behavioral changes, or other treatments may be necessary to treat heart failure. Medicines These may include:  Angiotensin-converting enzyme (ACE) inhibitors. This type of medicine blocks the effects of a blood protein called angiotensin-converting enzyme. ACE inhibitors relax (dilate) the blood vessels and help to lower blood pressure.  Angiotensin receptor blockers (ARBs). This type of medicine blocks the actions of a blood protein called angiotensin. ARBs dilate the blood vessels and help to lower blood pressure.  Water   pills (diuretics). Diuretics cause the kidneys to remove salt and water from the blood. The extra fluid is removed through urination, leaving a lower volume of blood that the heart has to pump.  Beta blockers. These improve heart  muscle strength and they prevent the heart from beating too quickly.  Digoxin. This increases the force of the heartbeat.  Healthy behavior changes These may include:  Reaching and maintaining a healthy weight.  Stopping smoking or chewing tobacco.  Eating heart-healthy foods.  Limiting or avoiding alcohol.  Stopping use of street drugs (illegal drugs).  Physical activity.  Other treatments These may include:  Surgery to open blocked coronary arteries or repair damaged heart valves.  Placement of a biventricular pacemaker to improve heart muscle function (cardiac resynchronization therapy). This device paces both the right ventricle and left ventricle.  Placement of a device to treat serious abnormal heart rhythms (implantable cardioverter defibrillator, or ICD).  Placement of a device to improve the pumping ability of the heart (left ventricular assist device, or LVAD).  Heart transplant. This can cure heart failure, and it is considered for certain patients who do not improve with other therapies.  Follow these instructions at home: Medicines  Take over-the-counter and prescription medicines only as told by your health care provider. Medicines are important in reducing the workload of your heart, slowing the progression of heart failure, and improving your symptoms. ? Do not stop taking your medicine unless your health care provider told you to do that. ? Do not skip any dose of medicine. ? Refill your prescriptions before you run out of medicine. You need your medicines every day. Eating and drinking   Eat heart-healthy foods. Talk with a dietitian to make an eating plan that is right for you. ? Choose foods that contain no trans fat and are low in saturated fat and cholesterol. Healthy choices include fresh or frozen fruits and vegetables, fish, lean meats, legumes, fat-free or low-fat dairy products, and whole-grain or high-fiber foods. ? Limit salt (sodium) if  directed by your health care provider. Sodium restriction may reduce symptoms of heart failure. Ask a dietitian to recommend heart-healthy seasonings. ? Use healthy cooking methods instead of frying. Healthy methods include roasting, grilling, broiling, baking, poaching, steaming, and stir-frying.  Limit your fluid intake if directed by your health care provider. Fluid restriction may reduce symptoms of heart failure. Lifestyle  Stop smoking or using chewing tobacco. Nicotine and tobacco can damage your heart and your blood vessels. Do not use nicotine gum or patches before talking to your health care provider.  Limit alcohol intake to no more than 1 drink per day for non-pregnant women and 2 drinks per day for men. One drink equals 12 oz of beer, 5 oz of wine, or 1 oz of hard liquor. ? Drinking more than that is harmful to your heart. Tell your health care provider if you drink alcohol several times a week. ? Talk with your health care provider about whether any level of alcohol use is safe for you. ? If your heart has already been damaged by alcohol or you have severe heart failure, drinking alcohol should be stopped completely.  Stop use of illegal drugs.  Lose weight if directed by your health care provider. Weight loss may reduce symptoms of heart failure.  Do moderate physical activity if directed by your health care provider. People who are elderly and people with severe heart failure should consult with a health care provider for physical activity recommendations.   Monitor important information  Weigh yourself every day. Keeping track of your weight daily helps you to notice excess fluid sooner. ? Weigh yourself every morning after you urinate and before you eat breakfast. ? Wear the same amount of clothing each time you weigh yourself. ? Record your daily weight. Provide your health care provider with your weight record.  Monitor and record your blood pressure as told by your health  care provider.  Check your pulse as told by your health care provider. Dealing with extreme temperatures  If the weather is extremely hot: ? Avoid vigorous physical activity. ? Use air conditioning or fans or seek a cooler location. ? Avoid caffeine and alcohol. ? Wear loose-fitting, lightweight, and light-colored clothing.  If the weather is extremely cold: ? Avoid vigorous physical activity. ? Layer your clothes. ? Wear mittens or gloves, a hat, and a scarf when you go outside. ? Avoid alcohol. General instructions  Manage other health conditions such as hypertension, diabetes, thyroid disease, or abnormal heart rhythms as told by your health care provider.  Learn to manage stress. If you need help to do this, ask your health care provider.  Plan rest periods when fatigued.  Get ongoing education and support as needed.  Participate in or seek rehabilitation as needed to maintain or improve independence and quality of life.  Stay up to date with immunizations. Keeping current on pneumococcal and influenza immunizations is especially important to prevent respiratory infections.  Keep all follow-up visits as told by your health care provider. This is important. Contact a health care provider if:  You have a rapid weight gain.  You have increasing shortness of breath that is unusual for you.  You are unable to participate in your usual physical activities.  You tire easily.  You cough more than normal, especially with physical activity.  You have any swelling or more swelling in areas such as your hands, feet, ankles, or abdomen.  You are unable to sleep because it is hard to breathe.  You feel like your heart is beating quickly (palpitations).  You become dizzy or light-headed when you stand up. Get help right away if:  You have difficulty breathing.  You notice or your family notices a change in your awareness, such as having trouble staying awake or having  difficulty with concentration.  You have pain or discomfort in your chest.  You have an episode of fainting (syncope). This information is not intended to replace advice given to you by your health care provider. Make sure you discuss any questions you have with your health care provider. Document Released: 11/26/2005 Document Revised: 07/31/2016 Document Reviewed: 06/20/2016 Elsevier Interactive Patient Education  2018 [employer] Inc[employer]  

## 2018-11-20 NOTE — Progress Notes (Signed)
Subjective:    Patient ID: Katherine Tyler, female    DOB: 1929-04-17, 84 y.o.   MRN: [id number]  Chief Complaint  Patient presents with  . Medical Management of Chronic Issues    three month recheck   Pt presents to the office today for chronic follow up. Pt is followed by Cardiologists for CHF and A Fibevery 6 months.  [name] BP is elevated today, but states her BP at home was 130/60, pulse 52. She states she gets nervous when coming the to doctor.  Hypertension  This is a chronic problem. The current episode started more than 1 year ago. The problem has been waxing and waning since onset. The problem is uncontrolled. Associated symptoms include anxiety and malaise/fatigue ("when I walk a lot"). Pertinent negatives include no headaches, peripheral edema or shortness of breath. Risk factors for coronary artery disease include dyslipidemia, diabetes mellitus, obesity and sedentary lifestyle. Hypertensive end-organ damage includes heart failure. There is no history of kidney disease or CAD/MI. Identifiable causes of hypertension include a thyroid problem.  Congestive Heart Failure  Presents for follow-up visit. Pertinent negatives include no nocturia or shortness of breath. The symptoms have been stable.  Thyroid Problem  Presents for follow-up visit. Symptoms include anxiety. Patient reports no constipation, depressed mood or diarrhea. The symptoms have been stable. Her past medical history is significant for heart failure.  Arthritis  Presents for follow-up visit. She complains of pain and joint warmth. The symptoms have been stable. Affected locations include the right knee and left knee. Her pain is at a severity of 8/10. Pertinent negatives include no diarrhea.  Anemia  Presents for follow-up visit. Symptoms include bruises/bleeds easily and malaise/fatigue ("when I walk a lot"). Past medical history includes heart failure.  Anxiety  Presents for follow-up visit. Symptoms include excessive  worry and nervous/anxious behavior. Patient reports no depressed mood, irritability, malaise or shortness of breath. Symptoms occur occasionally.   Her past medical history is significant for anemia.      Review of Systems  Constitutional: Positive for malaise/fatigue ("when I walk a lot"). Negative for irritability.  Respiratory: Negative for shortness of breath.   Gastrointestinal: Negative for constipation and diarrhea.  Genitourinary: Negative for nocturia.  Musculoskeletal: Positive for arthritis.  Neurological: Negative for headaches.  Hematological: Bruises/bleeds easily.  Psychiatric/Behavioral: The patient is nervous/anxious.   All other systems reviewed and are negative.      Objective:   Physical Exam Vitals signs reviewed.  Constitutional:      General: She is not in acute distress.    Appearance: She is well-developed.  HENT:     Head: Normocephalic and atraumatic.     Right Ear: Tympanic membrane normal.     Left Ear: Tympanic membrane normal.  Eyes:     Pupils: Pupils are equal, round, and reactive to light.  Neck:     Musculoskeletal: Normal range of motion and neck supple.     Thyroid: No thyromegaly.  Cardiovascular:     Rate and Rhythm: Normal rate and regular rhythm.     Heart sounds: Normal heart sounds. No murmur.  Pulmonary:     Effort: Pulmonary effort is normal. No respiratory distress.     Breath sounds: Normal breath sounds. No wheezing.  Abdominal:     General: Bowel sounds are normal. There is no distension.     Palpations: Abdomen is soft.     Tenderness: There is no abdominal tenderness.  Musculoskeletal:  General: No tenderness.     Right lower leg: Edema (trace) present.     Left lower leg: Edema (trace) present.     Comments: Using rolling walker, mild pain in lower lumbar with flexion  Skin:    General: Skin is warm and dry.  Neurological:     Mental Status: She is alert and oriented to person, place, and time.     Cranial  Nerves: No cranial nerve deficit.     Deep Tendon Reflexes: Reflexes are normal and symmetric.  Psychiatric:        Behavior: Behavior normal.        Thought Content: Thought content normal.        Judgment: Judgment normal.      BP (!) 178/55   Pulse (!) 57   Temp (!) 97.2 F (36.2 C) (Oral)   Ht _0  (1.575 m)   Wt 217 lb 3.2 oz (98.5 kg)   BMI 39.73 kg/m      Assessment & Plan:  Katherine Tyler comes in today with chief complaint of Medical Management of Chronic Issues (three month recheck)   Diagnosis and orders addressed:  1. Essential hypertension - metoprolol succinate (TOPROL XL) 25 MG 24 hr tablet; Take 1 tablet (25 mg total) by mouth daily.  Dispense: 90 tablet; Refill: 3 - CMP14+EGFR  2. Acute congestive heart failure, unspecified heart failure type ([place]) - CMP14+EGFR  3. Hypothyroidism, unspecified type - CMP14+EGFR  4. Primary osteoarthritis of right shoulder - CMP14+EGFR  5. Iron deficiency anemia, unspecified iron deficiency anemia type - CMP14+EGFR  6. Obesity (BMI 30-39.9) - CMP14+EGFR  7. GAD (generalized anxiety disorder) - CMP14+EGFR   Labs pending Health Maintenance reviewed Diet and exercise encouraged  Follow up plan: 6 months and keep all follow up with Cardiologists    Evelina Dun, FNP

## 2018-11-21 LAB — CMP14+EGFR
ALT: 8 IU/L (ref 0–32)
AST: 16 IU/L (ref 0–40)
Albumin/Globulin Ratio: 1.6 (ref 1.2–2.2)
Albumin: 4 g/dL (ref 3.5–4.7)
Alkaline Phosphatase: 64 IU/L (ref 39–117)
BUN/Creatinine Ratio: 24 (ref 12–28)
BUN: 33 mg/dL — AB (ref 8–27)
Bilirubin Total: 0.3 mg/dL (ref 0.0–1.2)
CO2: 21 mmol/L (ref 20–29)
Calcium: 9.7 mg/dL (ref 8.7–10.3)
Chloride: 98 mmol/L (ref 96–106)
Creatinine, Ser: 1.38 mg/dL — ABNORMAL HIGH (ref 0.57–1.00)
GFR calc Af Amer: 40 mL/min/{1.73_m2} — ABNORMAL LOW (ref >59)
GFR calc non Af Amer: 34 mL/min/{1.73_m2} — ABNORMAL LOW (ref >59)
GLUCOSE: 93 mg/dL (ref 65–99)
Globulin, Total: 2.5 g/dL (ref 1.5–4.5)
Potassium: 5 mmol/L (ref 3.5–5.2)
Sodium: 137 mmol/L (ref 134–144)
Total Protein: 6.5 g/dL (ref 6.0–8.5)

## 2018-12-08 ENCOUNTER — Other Ambulatory Visit: Payer: Self-pay | Admitting: Family

## 2018-12-08 NOTE — Telephone Encounter (Signed)
[name] daughter aware Dr Diona BrownerMcDowell sent rx into pharmacy 11/12/18 with 6 refills and to call pharmacy and have them fill it.

## 2019-01-29 ENCOUNTER — Telehealth: Payer: Self-pay

## 2019-01-29 ENCOUNTER — Other Ambulatory Visit: Payer: Medicare Other

## 2019-01-29 DIAGNOSIS — Z79899 Other long term (current) drug therapy: Secondary | ICD-10-CM | POA: Diagnosis not present

## 2019-01-29 NOTE — Telephone Encounter (Signed)
Lab slip requested to go to [facility]-done

## 2019-01-30 LAB — BASIC METABOLIC PANEL
BUN/Creatinine Ratio: 27 (ref 12–28)
BUN: 37 mg/dL — ABNORMAL HIGH (ref 8–27)
CO2: 21 mmol/L (ref 20–29)
Calcium: 9.5 mg/dL (ref 8.7–10.3)
Chloride: 95 mmol/L — ABNORMAL LOW (ref 96–106)
Creatinine, Ser: 1.38 mg/dL — ABNORMAL HIGH (ref 0.57–1.00)
GFR calc Af Amer: 40 mL/min/{1.73_m2} — ABNORMAL LOW (ref >59)
GFR, EST NON AFRICAN AMERICAN: 34 mL/min/{1.73_m2} — AB (ref >59)
Glucose: 92 mg/dL (ref 65–99)
Potassium: 5 mmol/L (ref 3.5–5.2)
Sodium: 133 mmol/L — ABNORMAL LOW (ref 134–144)

## 2019-01-30 LAB — CBC
HEMATOCRIT: 32.5 % — AB (ref 34.0–46.6)
Hemoglobin: 11.3 g/dL (ref 11.1–15.9)
MCH: 33.9 pg — ABNORMAL HIGH (ref 26.6–33.0)
MCHC: 34.8 g/dL (ref 31.5–35.7)
MCV: 98 fL — ABNORMAL HIGH (ref 79–97)
Platelets: 139 10*3/uL — ABNORMAL LOW (ref 150–450)
RBC: 3.33 x10E6/uL — ABNORMAL LOW (ref 3.77–5.28)
RDW: 12.8 % (ref 11.7–15.4)
WBC: 5.2 10*3/uL (ref 3.4–10.8)

## 2019-02-09 ENCOUNTER — Other Ambulatory Visit: Payer: Self-pay

## 2019-02-09 ENCOUNTER — Ambulatory Visit (INDEPENDENT_AMBULATORY_CARE_PROVIDER_SITE_OTHER): Payer: Medicare Other | Admitting: Cardiology

## 2019-02-09 ENCOUNTER — Encounter: Payer: Self-pay | Admitting: Cardiology

## 2019-02-09 VITALS — BP 180/74 | HR 58 | Ht 62.0 in | Wt 222.6 lb

## 2019-02-09 DIAGNOSIS — I5032 Chronic diastolic (congestive) heart failure: Secondary | ICD-10-CM

## 2019-02-09 DIAGNOSIS — I10 Essential (primary) hypertension: Secondary | ICD-10-CM

## 2019-02-09 DIAGNOSIS — E039 Hypothyroidism, unspecified: Secondary | ICD-10-CM | POA: Diagnosis not present

## 2019-02-09 DIAGNOSIS — I480 Paroxysmal atrial fibrillation: Secondary | ICD-10-CM | POA: Diagnosis not present

## 2019-02-09 DIAGNOSIS — I4891 Unspecified atrial fibrillation: Secondary | ICD-10-CM

## 2019-02-09 DIAGNOSIS — I509 Heart failure, unspecified: Secondary | ICD-10-CM

## 2019-02-09 MED ORDER — METOPROLOL SUCCINATE ER 25 MG PO TB24: 25.0000 mg | ORAL_TABLET | Freq: Every day | ORAL | 3 refills | Status: DC

## 2019-02-09 MED ORDER — FUROSEMIDE 20 MG PO TABS: 20.0000 mg | ORAL_TABLET | ORAL | 3 refills | Status: DC

## 2019-02-09 MED ORDER — DILTIAZEM HCL ER COATED BEADS 120 MG PO CP24: 120.0000 mg | ORAL_CAPSULE | Freq: Two times a day (BID) | ORAL | 3 refills | Status: DC

## 2019-02-09 MED ORDER — LOSARTAN POTASSIUM 100 MG PO TABS: 100.0000 mg | ORAL_TABLET | Freq: Every day | ORAL | 3 refills | Status: DC

## 2019-02-09 MED ORDER — APIXABAN 5 MG PO TABS: 5.0000 mg | ORAL_TABLET | Freq: Two times a day (BID) | ORAL | 3 refills | Status: DC

## 2019-02-09 NOTE — Progress Notes (Signed)
Cardiology Office Note  Date: 02/09/2019   ID: Katherine Tyler, DOB 20-Jan-1929, MRN [id number]  PCP: Junie [facility], FNP  Primary Cardiologist: Nona Dell, MD   Chief Complaint  Patient presents with  . Atrial Fibrillation    History of Present Illness: Katherine Tyler is an 85 y.o. female last seen in September 2019.  She is here today with a family member for follow-up.  She does not report any palpitations or chest pain.  States that she has been taking her medications regularly.  I reviewed her recent lab work showing increase in hemoglobin to 11.3 with stable renal function and potassium.  She continues on Eliquis, denies any obvious bleeding problems or changes in stool.  Blood pressure was elevated today, she reports compliance with Cozaar in addition to her AV nodal blocker regimen.  I recommended that she keep follow-up with her PCP.  I personally reviewed her ECG today which shows sinus rhythm with LVH and repolarization changes, right bundle branch block.  Past Medical History:  Diagnosis Date  . Anxiety   . Arthritis   . GERD (gastroesophageal reflux disease)   . Hiatal hernia   . Hyperlipidemia   . Hypertension   . Hypothyroidism   . Obesity   . PAF (paroxysmal atrial fibrillation) (HCC)    a. diagnosed in 01/2018. Started on Eliquis for anticoagulation.   . Vitamin D deficiency     Past Surgical History:  Procedure Laterality Date  . CHOLECYSTECTOMY    . TUBAL LIGATION  1960    Current Outpatient Medications  Medication Sig Dispense Refill  . apixaban (ELIQUIS) 5 MG TABS tablet Take 1 tablet (5 mg total) by mouth 2 (two) times daily. 60 tablet 6  . Cholecalciferol (D3-1000) 1000 UNITS capsule Take 1,000 Units by mouth daily.     . citalopram (CELEXA) 20 MG tablet Take 1 tablet (20 mg total) by mouth daily. 90 tablet 1  . diltiazem (CARDIZEM CD) 120 MG 24 hr capsule TAKE 1 CAPSULE BY MOUTH TWICE DAILY 60 capsule 5  . Ferrous Sulfate Dried (SLOW RELEASE IRON)  45 MG TBCR Take 45 mg by mouth 2 (two) times daily. 60 tablet 2  . furosemide (LASIX) 20 MG tablet Take 1 tablet (20 mg total) by mouth every other day. 45 tablet 3  . levothyroxine (SYNTHROID, LEVOTHROID) 125 MCG tablet Take 125 mcg by mouth daily before breakfast.    . losartan (COZAAR) 100 MG tablet Take 1 tablet (100 mg total) by mouth daily. 90 tablet 3  . magnesium oxide (MAG-OX) 400 (241.3 MG) MG tablet Take 1 tablet (400 mg total) by mouth daily. 30 tablet 0  . metoprolol succinate (TOPROL XL) 25 MG 24 hr tablet Take 1 tablet (25 mg total) by mouth daily. 90 tablet 3  . nystatin (MYCOSTATIN/NYSTOP) powder Apply topically 4 (four) times daily. 15 g 0  . thiamine (VITAMIN B-1) 100 MG tablet Take 100 mg by mouth daily.     No current facility-administered medications for this visit.    Allergies:  Terazosin hcl and Keflex [cephalexin]   Social History: The patient  reports that she has never smoked. She quit smokeless tobacco use about 61 years ago.  Her smokeless tobacco use included snuff. She reports that she does not drink alcohol or use drugs.   ROS:  Please see the history of present illness. Otherwise, complete review of systems is positive for hearing loss.  All other systems are reviewed and negative.   Physical  Exam: VS:  BP (!) 180/74 (BP Location: Right Arm, Cuff Size: Large)   Pulse (!) 58   Ht 5\' 2"  (1.575 m)   Wt 222 lb 9.6 oz (101 kg)   SpO2 97%   BMI 40.71 kg/m , BMI Body mass index is 40.71 kg/m.  Wt Readings from Last 3 Encounters:  02/09/19 222 lb 9.6 oz (101 kg)  11/20/18 217 lb 3.2 oz (98.5 kg)  08/18/18 217 lb 6.4 oz (98.6 kg)    General: Elderly woman, appears comfortable at rest. HEENT: Conjunctiva and lids normal, oropharynx clear. Neck: Supple, no elevated JVP or carotid bruits, no thyromegaly. Lungs: Clear to auscultation, nonlabored breathing at rest. Cardiac: Regular rate and rhythm, no S3, 2/6 systolic murmur. Abdomen: Soft, nontender, bowel  sounds present. Extremities: No pitting edema, distal pulses 2+. Skin: Warm and dry. Musculoskeletal: No kyphosis. Neuropsychiatric: Alert and oriented x3, affect grossly appropriate.  ECG: I personally reviewed the tracing from 01/17/2018 which showed sinus rhythm with prolonged PR interval and increased voltage, incomplete right bundle branch block.  Recent Labwork: 08/18/2018: TSH 5.460 11/20/2018: ALT 8; AST 16 01/29/2019: BUN 37; Creatinine, Ser 1.38; Hemoglobin 11.3; Platelets 139; Potassium 5.0; Sodium 133     Component Value Date/Time   CHOL 154 04/05/2014 1051   CHOL 168 04/16/2013 1155   TRIG 93 04/05/2014 1051   TRIG 85 04/16/2013 1155   HDL 59 04/05/2014 1051   HDL 58 04/16/2013 1155   LDLCALC 76 04/05/2014 1051   LDLCALC 93 04/16/2013 1155    Other Studies Reviewed Today:  Echocardiogram 01/15/2018: Study Conclusions  - Left ventricle: The cavity size was normal. Wall thickness was increased in a pattern of mild LVH. Systolic function was normal. The estimated ejection fraction was in the range of 60% to 65%. Wall motion was normal; there were no regional wall motion abnormalities. Features are consistent with a pseudonormal left ventricular filling pattern, with concomitant abnormal relaxation and increased filling pressure (grade 2 diastolic dysfunction). Doppler parameters are consistent with high ventricular filling pressure. - Mitral valve: Calcified annulus. There was moderate regurgitation. - Left atrium: The atrium was mildly dilated. - Tricuspid valve: There was moderate regurgitation. - Pulmonary arteries: PA peak pressure: 52 mm Hg (S).  Assessment and Plan:  1.  Paroxysmal atrial fibrillation.  She is maintaining sinus rhythm by follow-up ECG and denies any significant palpitations.  Continue current AV nodal blocker regimen for heart rate control and otherwise remain on Eliquis for stroke prophylaxis.  Follow-up CBC and BMET for next  visit.  2.  Chronic diastolic heart failure.  Weights have been generally stable by report.  Echocardiogram from last year showed LVEF 60 to 65% with moderate diastolic dysfunction.  Continue Lasix.  Also discussed fluid intake.  3.  Essential hypertension, blood pressure is elevated today.  She reports compliance with therapy.  Keep follow-up with PCP.  4.  Hypothyroidism on Synthroid.  Current medicines were reviewed with the patient today.   Orders Placed This Encounter  Procedures  . CBC  . Basic Metabolic Panel (BMET)  . EKG 12-Lead    Disposition: Follow-up in 6 months.  Signed, Jonelle Sidle, MD, [facility] 02/09/2019 3:50 PM    [facility] [address], [place], [place] [address] Phone: [phone]; Fax: [phone]

## 2019-02-09 NOTE — Patient Instructions (Signed)
Medication Instructions: Your physician recommends that you continue on your current medications as directed. Please refer to the Current Medication list given to you today.   Labwork: CBC, BMET  JUST BEFORE VISIT IN 6 MONTHS  Procedures/Testing: None today  Follow-Up: 6 months with Dr.McDowell  Any Additional Special Instructions Will Be Listed Below (If Applicable).     If you need a refill on your cardiac medications before your next appointment, please call your pharmacy.

## 2019-02-09 NOTE — Telephone Encounter (Signed)
Refilled 90 day supply on all cardiac meds to [facility]

## 2019-03-26 ENCOUNTER — Other Ambulatory Visit: Payer: Self-pay | Admitting: Family

## 2019-03-26 DIAGNOSIS — F411 Generalized anxiety disorder: Secondary | ICD-10-CM

## 2019-03-30 ENCOUNTER — Other Ambulatory Visit: Payer: Self-pay | Admitting: Family

## 2019-03-30 DIAGNOSIS — F411 Generalized anxiety disorder: Secondary | ICD-10-CM

## 2019-03-31 NOTE — Telephone Encounter (Signed)
Resending. [facility] having computer issues 03/27/19 not received

## 2019-05-22 ENCOUNTER — Ambulatory Visit: Payer: Medicare Other | Admitting: Family

## 2019-05-26 ENCOUNTER — Ambulatory Visit (INDEPENDENT_AMBULATORY_CARE_PROVIDER_SITE_OTHER): Payer: Medicare Other | Admitting: *Deleted

## 2019-05-26 DIAGNOSIS — Z0000 Encounter for general adult medical examination without abnormal findings: Secondary | ICD-10-CM | POA: Diagnosis not present

## 2019-05-26 NOTE — Patient Instructions (Signed)
Preventive Care 42 Years and Older, Female Preventive care refers to lifestyle choices and visits with your health care provider that can promote health and wellness. What does preventive care include?  A yearly physical exam. This is also called an annual well check.  Dental exams once or twice a year.  Routine eye exams. Ask your health care provider how often you should have your eyes checked.  Personal lifestyle choices, including: ? Daily care of your teeth and gums. ? Regular physical activity. ? Eating a healthy diet. ? Avoiding tobacco and drug use. ? Limiting alcohol use. ? Practicing safe sex. ? Taking low-dose aspirin every day. ? Taking vitamin and mineral supplements as recommended by your health care provider. What happens during an annual well check? The services and screenings done by your health care provider during your annual well check will depend on your age, overall health, lifestyle risk factors, and family history of disease. Counseling Your health care provider may ask you questions about your:  Alcohol use.  Tobacco use.  Drug use.  Emotional well-being.  Home and relationship well-being.  Sexual activity.  Eating habits.  History of falls.  Memory and ability to understand (cognition).  Work and work Statistician.  Reproductive health.  Screening You may have the following tests or measurements:  Height, weight, and BMI.  Blood pressure.  Lipid and cholesterol levels. These may be checked every 5 years, or more frequently if you are over 30 years old.  Skin check.  Lung cancer screening. You may have this screening every year starting at age 27 if you have a 30-pack-year history of smoking and currently smoke or have quit within the past 15 years.  Colorectal cancer screening. All adults should have this screening starting at age 33 and continuing until age 46. You will have tests every 1-10 years, depending on your results and the  type of screening test. People at increased risk should start screening at an earlier age. Screening tests may include: ? Guaiac-based fecal occult blood testing. ? Fecal immunochemical test (FIT). ? Stool DNA test. ? Virtual colonoscopy. ? Sigmoidoscopy. During this test, a flexible tube with a tiny camera (sigmoidoscope) is used to examine your rectum and lower colon. The sigmoidoscope is inserted through your anus into your rectum and lower colon. ? Colonoscopy. During this test, a long, thin, flexible tube with a tiny camera (colonoscope) is used to examine your entire colon and rectum.  Hepatitis C blood test.  Hepatitis B blood test.  Sexually transmitted disease (STD) testing.  Diabetes screening. This is done by checking your blood sugar (glucose) after you have not eaten for a while (fasting). You may have this done every 1-3 years.  Bone density scan. This is done to screen for osteoporosis. You may have this done starting at age 37.  Mammogram. This may be done every 1-2 years. Talk to your health care provider about how often you should have regular mammograms. Talk with your health care provider about your test results, treatment options, and if necessary, the need for more tests. Vaccines Your health care provider may recommend certain vaccines, such as:  Influenza vaccine. This is recommended every year.  Tetanus, diphtheria, and acellular pertussis (Tdap, Td) vaccine. You may need a Td booster every 10 years.  Varicella vaccine. You may need this if you have not been vaccinated.  Zoster vaccine. You may need this after age 38.  Measles, mumps, and rubella (MMR) vaccine. You may need at least  one dose of MMR if you were born in 1957 or later. You may also need a second dose.  Pneumococcal 13-valent conjugate (PCV13) vaccine. One dose is recommended after age 24.  Pneumococcal polysaccharide (PPSV23) vaccine. One dose is recommended after age 24.  Meningococcal  vaccine. You may need this if you have certain conditions.  Hepatitis A vaccine. You may need this if you have certain conditions or if you travel or work in places where you may be exposed to hepatitis A.  Hepatitis B vaccine. You may need this if you have certain conditions or if you travel or work in places where you may be exposed to hepatitis B.  Haemophilus influenzae type b (Hib) vaccine. You may need this if you have certain conditions. Talk to your health care provider about which screenings and vaccines you need and how often you need them. This information is not intended to replace advice given to you by your health care provider. Make sure you discuss any questions you have with your health care provider. Document Released: 12/23/2015 Document Revised: 01/16/2018 Document Reviewed: 09/27/2015 Elsevier Interactive Patient Education  2019 [employer].

## 2019-05-26 NOTE — Progress Notes (Signed)
MEDICARE ANNUAL WELLNESS VISIT  05/26/2019  Telephone Visit Disclaimer This Medicare AWV was conducted by telephone due to national recommendations for restrictions regarding the COVID-19 Pandemic (e.g. social distancing).  I verified, using two identifiers, that I am speaking with Katherine Tyler Age or their authorized healthcare agent. I discussed the limitations, risks, security, and privacy concerns of performing an evaluation and management service by telephone and the potential availability of an in-person appointment in the future. The patient expressed understanding and agreed to proceed.   Subjective:  Katherine Tyler Doering is a 85 y.o. female patient of Hawks, Edilia Bohristy A, FNP who had a Medicare Annual Wellness Visit today via telephone. Katherine Tyler is Retired and lives alone. she has 6 children. she reports that she is socially active and does interact with friends/family regularly. she is minimally physically active and enjoys coloring.  Patient Care Team: Junie SpencerHawks, Christy A, FNP as PCP - General (Nurse Practitioner) Jonelle SidleMcDowell, Samuel G, MD as PCP - Cardiology (Cardiology)  Advanced Directives 05/26/2019 01/15/2018 01/14/2018 03/21/2017 06/18/2016 08/24/2015  Does Patient Have a Medical Advance Directive? No No No No No No  Would patient like information on creating a medical advance directive? No - Patient declined No - Patient declined - No - Patient declined;Yes (MAU/Ambulatory/Procedural Areas - Information given) No - patient declined information -    Hospital Utilization Over the Past 12 Months: # of hospitalizations or ER visits: 0 # of surgeries: 0  Review of Systems    Patient reports that her overall health is unchanged compared to last year.  Patient Reported Readings (BP, Pulse, CBG, Weight, etc) none  Review of Systems: No complaints  All other systems negative.  Pain Assessment Pain : No/denies pain     Current Medications & Allergies (verified) Allergies as of 05/26/2019    Reactions   Terazosin Hcl Other (See Comments)   Muscle aches   Keflex [cephalexin] Other (See Comments)   unknown      Medication List       Accurate as of May 26, 2019 10:15 AM. If you have any questions, ask your nurse or doctor.        apixaban 5 MG Tabs tablet Commonly known as: ELIQUIS Take 1 tablet (5 mg total) by mouth 2 (two) times daily.   citalopram 20 MG tablet Commonly known as: CELEXA Take 1 tablet by mouth once daily   D3-1000 25 MCG (1000 UT) capsule Generic drug: Cholecalciferol Take 1,000 Units by mouth daily.   diltiazem 120 MG 24 hr capsule Commonly known as: CARDIZEM CD Take 1 capsule (120 mg total) by mouth 2 (two) times daily.   Ferrous Sulfate Dried 45 MG Tbcr Commonly known as: Slow Release Iron Take 45 mg by mouth 2 (two) times daily.   furosemide 20 MG tablet Commonly known as: LASIX Take 1 tablet (20 mg total) by mouth every other day.   levothyroxine 125 MCG tablet Commonly known as: SYNTHROID Take 125 mcg by mouth daily before breakfast.   losartan 100 MG tablet Commonly known as: COZAAR Take 1 tablet (100 mg total) by mouth daily.   magnesium oxide 400 (241.3 Mg) MG tablet Commonly known as: MAG-OX Take 1 tablet (400 mg total) by mouth daily.   metoprolol succinate 25 MG 24 hr tablet Commonly known as: Toprol XL Take 1 tablet (25 mg total) by mouth daily.   nystatin powder Commonly known as: MYCOSTATIN/NYSTOP Apply topically 4 (four) times daily.   thiamine 100 MG tablet Commonly known as:  VITAMIN B-1 Take 100 mg by mouth daily.       History (reviewed): Past Medical History:  Diagnosis Date  . Anxiety   . Arthritis   . GERD (gastroesophageal reflux disease)   . Hiatal hernia   . Hyperlipidemia   . Hypertension   . Hypothyroidism   . Obesity   . PAF (paroxysmal atrial fibrillation) ([place])    a. diagnosed in 01/2018. Started on Eliquis for anticoagulation.   . Vitamin D deficiency    Past Surgical History:   Procedure Laterality Date  . CHOLECYSTECTOMY    . TUBAL LIGATION  1960   Family History  Problem Relation Age of Onset  . Heart disease Mother   . Stroke Father   . Heart disease Sister   . Heart disease Brother   . Heart disease Sister   . Heart disease Sister   . Heart disease Brother   . Heart disease Brother   . Diabetes Daughter    Social History   Socioeconomic History  . Marital status: Widowed    Spouse name: Not on file  . Number of children: 6  . Years of education: 6  . Highest education level: 6th grade  Occupational History  . Occupation: Retired  Scientific laboratory technician  . Financial resource strain: Not hard at all  . Food insecurity    Worry: Never true    Inability: Never true  . Transportation needs    Medical: No    Non-medical: No  Tobacco Use  . Smoking status: Never Smoker  . Smokeless tobacco: Former Systems developer    Types: Snuff  Substance and Sexual Activity  . Alcohol use: No  . Drug use: No  . Sexual activity: Not Currently    Birth control/protection: Surgical  Lifestyle  . Physical activity    Days per week: 7 days    Minutes per session: 10 min  . Stress: Not at all  Relationships  . Social connections    Talks on phone: More than three times a week    Gets together: More than three times a week    Attends religious service: Never    Active member of club or organization: No    Attends meetings of clubs or organizations: Never    Relationship status: Widowed  Other Topics Concern  . Not on file  Social History Narrative   Lives alone but all 6 of her kids alternate and come by daily. Some in the morning and some in the evening to check on her and help her.    Activities of Daily Living In your present state of health, do you have any difficulty performing the following activities: 05/26/2019  Hearing? Y  Comment pt wears hearing aids  Vision? N  Difficulty concentrating or making decisions? N  Walking or climbing stairs? N  Dressing or  bathing? N  Doing errands, shopping? Y  Comment her kids take her to do all her errands and to all appointments  Preparing Food and eating ? N  Using the Toilet? N  In the past six months, have you accidently leaked urine? N  Do you have problems with loss of bowel control? N  Managing your Medications? N  Managing your Finances? Y  Comment her daughter pays all her bills  Housekeeping or managing your Housekeeping? Y  Comment her kids take care of all the house work  Some recent data might be hidden    Patient Literacy How often do you need to  have someone help you when you read instructions, pamphlets, or other written materials from your doctor or pharmacy?: 5 - Always(pt states she never learned to read) What is the last grade level you completed in school?: 6th grade  Exercise Current Exercise Habits: Home exercise routine, Type of exercise: walking;stretching, Time (Minutes): 10, Frequency (Times/Week): 7, Weekly Exercise (Minutes/Week): 70, Intensity: Mild  Diet Patient reports consuming 3 meals a day and 0 snack(s) a day Patient reports that her primary diet is: Regular Patient reports that she does have regular access to food.   Depression Screen PHQ 2/9 Scores 05/26/2019 11/20/2018 08/18/2018 06/17/2018 06/03/2018 05/09/2018 02/03/2018  PHQ - 2 Score 0 0 0 0 0 0 0     Fall Risk Fall Risk  05/26/2019 11/20/2018 08/18/2018 06/17/2018 06/03/2018  Falls in the past year? 0 0 No No No  Number falls in past yr: - - - - -  Injury with Fall? - - - - -  Comment - - - - -  Risk Factor Category  - - - - -  Comment - - - - -  Risk for fall due to : - - - - -     Objective:  Katherine Tyler Kamel seemed alert and oriented and she participated appropriately during our telephone visit.  Blood Pressure Weight BMI  BP Readings from Last 3 Encounters:  02/09/19 (!) 180/74  11/20/18 (!) 151/63  08/18/18 (!) 175/73   Wt Readings from Last 3 Encounters:  02/09/19 222 lb 9.6 oz (101 kg)  11/20/18 217  lb 3.2 oz (98.5 kg)  08/18/18 217 lb 6.4 oz (98.6 kg)   BMI Readings from Last 1 Encounters:  02/09/19 40.71 kg/m    *Unable to obtain current vital signs, weight, and BMI due to telephone visit type  Hearing/Vision  . Kaleisha did not seem to have difficulty with hearing/understanding during the telephone conversation . Reports that she has not had a formal eye exam by an eye care professional within the past year . Reports that she has not had a formal hearing evaluation within the past year *Unable to fully assess hearing and vision during telephone visit type  Cognitive Function: 6CIT Screen 05/26/2019  What Year? 0 points  What month? 0 points  What time? 0 points  Count back from 20 0 points  Months in reverse 4 points  Repeat phrase 6 points  Total Score 10   (Normal:0-7, Significant for Dysfunction: >8)  Normal Cognitive Function Screening: No: pt scored 10 on the screening-re-evaluate at next visit with PCP   Immunization & Health Maintenance Record Immunization History  Administered Date(s) Administered  . Influenza, High Dose Seasonal PF 10/11/2014, 09/24/2016, 10/01/2017, 11/20/2018  . Influenza,inj,Quad PF,6+ Mos 08/31/2013, 09/12/2015  . Pneumococcal Conjugate-13 05/03/2015  . Pneumococcal Polysaccharide-23 12/10/1996  . Td 01/16/2016  . Tdap 01/16/2016  . Zoster 01/16/2016    Health Maintenance  Topic Date Due  . DEXA SCAN  05/02/2017  . INFLUENZA VACCINE  07/11/2019  . TETANUS/TDAP  01/15/2026  . PNA vac Low Risk Adult  Completed       Assessment  This is a routine wellness examination for [facility].  Health Maintenance: Due or Overdue Health Maintenance Due  Topic Date Due  . DEXA SCAN  05/02/2017    Katherine Tyler Wardrip does not need a referral for Community Assistance: Care Management:   no Social Work:    no Prescription Assistance:  no Nutrition/Diabetes Education:  no   Plan:  Personalized Goals  Goals Addressed            This Visit's  Progress   . Patient Stated (pt-stated)       " I want to keep doing my legs and arm exercises every night so I keep my strength up"      Personalized Health Maintenance & Screening Recommendations  Advanced directives: has NO advanced directive - not interested in additional information  Lung Cancer Screening Recommended: no (Low Dose CT Chest recommended if Age 53-80 years, 30 pack-year currently smoking OR have quit w/in past 15 years) Hepatitis C Screening recommended: no HIV Screening recommended: no  Advanced Directives: Written information was not prepared per patient's request.  Referrals & Orders No orders of the defined types were placed in this encounter.   Follow-up Plan . Follow-up with Junie SpencerHawks, Christy A, FNP as planned    I have personally reviewed and noted the following in the patient's chart:   . Medical and social history . Use of alcohol, tobacco or illicit drugs  . Current medications and supplements . Functional ability and status . Nutritional status . Physical activity . Advanced directives . List of other physicians . Hospitalizations, surgeries, and ER visits in previous 12 months . Vitals . Screenings to include cognitive, depression, and falls . Referrals and appointments  In addition, I have reviewed and discussed with [facility]Cherrell Vanbuskirk certain preventive protocols, quality metrics, and best practice recommendations. A written personalized care plan for preventive services as well as general preventive health recommendations is available and can be mailed to the patient at her request.      Margurite AuerbachCompton, Jaheem Hedgepath G, LPN  [id number]/16/2020

## 2019-06-22 ENCOUNTER — Other Ambulatory Visit: Payer: Self-pay | Admitting: Family

## 2019-06-22 DIAGNOSIS — F411 Generalized anxiety disorder: Secondary | ICD-10-CM

## 2019-07-12 ENCOUNTER — Encounter (HOSPITAL_COMMUNITY)
Admission: EM | Disposition: A | Discharge status: Rehabilitation Facility | Payer: Self-pay | Source: Home / Self Care | Attending: Internal Medicine

## 2019-07-12 ENCOUNTER — Inpatient Hospital Stay (HOSPITAL_COMMUNITY): Payer: Medicare Other | Admitting: Anesthesiology

## 2019-07-12 ENCOUNTER — Emergency Department (HOSPITAL_COMMUNITY): Payer: Medicare Other

## 2019-07-12 ENCOUNTER — Other Ambulatory Visit: Payer: Self-pay

## 2019-07-12 ENCOUNTER — Inpatient Hospital Stay (HOSPITAL_COMMUNITY)
Admission: EM | Admit: 2019-07-12 | Discharge: 2019-07-15 | DRG: 493 | Disposition: A | Discharge status: Rehabilitation Facility | Payer: Medicare Other | Attending: Internal Medicine | Admitting: Internal Medicine

## 2019-07-12 ENCOUNTER — Encounter (HOSPITAL_COMMUNITY): Payer: Self-pay | Admitting: Emergency Medicine

## 2019-07-12 DIAGNOSIS — K219 Gastro-esophageal reflux disease without esophagitis: Secondary | ICD-10-CM | POA: Diagnosis present

## 2019-07-12 DIAGNOSIS — R58 Hemorrhage, not elsewhere classified: Secondary | ICD-10-CM | POA: Diagnosis not present

## 2019-07-12 DIAGNOSIS — S9304XA Dislocation of right ankle joint, initial encounter: Secondary | ICD-10-CM | POA: Diagnosis present

## 2019-07-12 DIAGNOSIS — Z888 Allergy status to other drugs, medicaments and biological substances status: Secondary | ICD-10-CM

## 2019-07-12 DIAGNOSIS — S82891B Other fracture of right lower leg, initial encounter for open fracture type I or II: Secondary | ICD-10-CM | POA: Diagnosis not present

## 2019-07-12 DIAGNOSIS — S93431A Sprain of tibiofibular ligament of right ankle, initial encounter: Secondary | ICD-10-CM | POA: Diagnosis present

## 2019-07-12 DIAGNOSIS — R52 Pain, unspecified: Secondary | ICD-10-CM | POA: Diagnosis not present

## 2019-07-12 DIAGNOSIS — M1990 Unspecified osteoarthritis, unspecified site: Secondary | ICD-10-CM | POA: Diagnosis present

## 2019-07-12 DIAGNOSIS — S82251B Displaced comminuted fracture of shaft of right tibia, initial encounter for open fracture type I or II: Secondary | ICD-10-CM

## 2019-07-12 DIAGNOSIS — Z7901 Long term (current) use of anticoagulants: Secondary | ICD-10-CM

## 2019-07-12 DIAGNOSIS — S82401A Unspecified fracture of shaft of right fibula, initial encounter for closed fracture: Secondary | ICD-10-CM | POA: Diagnosis not present

## 2019-07-12 DIAGNOSIS — N179 Acute kidney failure, unspecified: Secondary | ICD-10-CM | POA: Diagnosis not present

## 2019-07-12 DIAGNOSIS — Z8249 Family history of ischemic heart disease and other diseases of the circulatory system: Secondary | ICD-10-CM | POA: Diagnosis not present

## 2019-07-12 DIAGNOSIS — F419 Anxiety disorder, unspecified: Secondary | ICD-10-CM | POA: Diagnosis present

## 2019-07-12 DIAGNOSIS — D62 Acute posthemorrhagic anemia: Secondary | ICD-10-CM

## 2019-07-12 DIAGNOSIS — I480 Paroxysmal atrial fibrillation: Secondary | ICD-10-CM | POA: Diagnosis present

## 2019-07-12 DIAGNOSIS — I4891 Unspecified atrial fibrillation: Secondary | ICD-10-CM | POA: Diagnosis not present

## 2019-07-12 DIAGNOSIS — W1839XA Other fall on same level, initial encounter: Secondary | ICD-10-CM | POA: Diagnosis present

## 2019-07-12 DIAGNOSIS — I959 Hypotension, unspecified: Secondary | ICD-10-CM | POA: Diagnosis not present

## 2019-07-12 DIAGNOSIS — S82401S Unspecified fracture of shaft of right fibula, sequela: Secondary | ICD-10-CM | POA: Diagnosis not present

## 2019-07-12 DIAGNOSIS — S82209B Unspecified fracture of shaft of unspecified tibia, initial encounter for open fracture type I or II: Secondary | ICD-10-CM | POA: Diagnosis present

## 2019-07-12 DIAGNOSIS — S82851B Displaced trimalleolar fracture of right lower leg, initial encounter for open fracture type I or II: Secondary | ICD-10-CM | POA: Diagnosis present

## 2019-07-12 DIAGNOSIS — Y92009 Unspecified place in unspecified non-institutional (private) residence as the place of occurrence of the external cause: Secondary | ICD-10-CM

## 2019-07-12 DIAGNOSIS — Z6841 Body Mass Index (BMI) 40.0 and over, adult: Secondary | ICD-10-CM

## 2019-07-12 DIAGNOSIS — N183 Chronic kidney disease, stage 3 (moderate): Secondary | ICD-10-CM | POA: Diagnosis present

## 2019-07-12 DIAGNOSIS — Z87891 Personal history of nicotine dependence: Secondary | ICD-10-CM

## 2019-07-12 DIAGNOSIS — Z79899 Other long term (current) drug therapy: Secondary | ICD-10-CM

## 2019-07-12 DIAGNOSIS — S299XXA Unspecified injury of thorax, initial encounter: Secondary | ICD-10-CM | POA: Diagnosis not present

## 2019-07-12 DIAGNOSIS — R0989 Other specified symptoms and signs involving the circulatory and respiratory systems: Secondary | ICD-10-CM | POA: Diagnosis not present

## 2019-07-12 DIAGNOSIS — S91011A Laceration without foreign body, right ankle, initial encounter: Secondary | ICD-10-CM | POA: Diagnosis not present

## 2019-07-12 DIAGNOSIS — R0689 Other abnormalities of breathing: Secondary | ICD-10-CM | POA: Diagnosis not present

## 2019-07-12 DIAGNOSIS — S82201S Unspecified fracture of shaft of right tibia, sequela: Secondary | ICD-10-CM | POA: Diagnosis not present

## 2019-07-12 DIAGNOSIS — Z03818 Encounter for observation for suspected exposure to other biological agents ruled out: Secondary | ICD-10-CM | POA: Diagnosis not present

## 2019-07-12 DIAGNOSIS — G8918 Other acute postprocedural pain: Secondary | ICD-10-CM | POA: Diagnosis not present

## 2019-07-12 DIAGNOSIS — Z20828 Contact with and (suspected) exposure to other viral communicable diseases: Secondary | ICD-10-CM | POA: Diagnosis not present

## 2019-07-12 DIAGNOSIS — S82851S Displaced trimalleolar fracture of right lower leg, sequela: Secondary | ICD-10-CM | POA: Diagnosis not present

## 2019-07-12 DIAGNOSIS — S8261XA Displaced fracture of lateral malleolus of right fibula, initial encounter for closed fracture: Secondary | ICD-10-CM | POA: Diagnosis not present

## 2019-07-12 DIAGNOSIS — E785 Hyperlipidemia, unspecified: Secondary | ICD-10-CM | POA: Diagnosis present

## 2019-07-12 DIAGNOSIS — I2510 Atherosclerotic heart disease of native coronary artery without angina pectoris: Secondary | ICD-10-CM | POA: Diagnosis present

## 2019-07-12 DIAGNOSIS — I130 Hypertensive heart and chronic kidney disease with heart failure and stage 1 through stage 4 chronic kidney disease, or unspecified chronic kidney disease: Secondary | ICD-10-CM | POA: Diagnosis present

## 2019-07-12 DIAGNOSIS — I10 Essential (primary) hypertension: Secondary | ICD-10-CM | POA: Diagnosis present

## 2019-07-12 DIAGNOSIS — I5032 Chronic diastolic (congestive) heart failure: Secondary | ICD-10-CM | POA: Diagnosis present

## 2019-07-12 DIAGNOSIS — D509 Iron deficiency anemia, unspecified: Secondary | ICD-10-CM | POA: Diagnosis present

## 2019-07-12 DIAGNOSIS — F329 Major depressive disorder, single episode, unspecified: Secondary | ICD-10-CM | POA: Diagnosis present

## 2019-07-12 DIAGNOSIS — E039 Hypothyroidism, unspecified: Secondary | ICD-10-CM | POA: Diagnosis present

## 2019-07-12 DIAGNOSIS — S82851E Displaced trimalleolar fracture of right lower leg, subsequent encounter for open fracture type I or II with routine healing: Secondary | ICD-10-CM | POA: Diagnosis not present

## 2019-07-12 DIAGNOSIS — Z79890 Hormone replacement therapy (postmenopausal): Secondary | ICD-10-CM | POA: Diagnosis not present

## 2019-07-12 DIAGNOSIS — E871 Hypo-osmolality and hyponatremia: Secondary | ICD-10-CM | POA: Diagnosis not present

## 2019-07-12 DIAGNOSIS — D649 Anemia, unspecified: Secondary | ICD-10-CM | POA: Diagnosis not present

## 2019-07-12 DIAGNOSIS — E669 Obesity, unspecified: Secondary | ICD-10-CM | POA: Diagnosis present

## 2019-07-12 DIAGNOSIS — W19XXXA Unspecified fall, initial encounter: Secondary | ICD-10-CM

## 2019-07-12 DIAGNOSIS — K449 Diaphragmatic hernia without obstruction or gangrene: Secondary | ICD-10-CM | POA: Insufficient documentation

## 2019-07-12 HISTORY — DX: Hyperkalemia: E87.5

## 2019-07-12 HISTORY — PX: ORIF ANKLE FRACTURE: SHX5408

## 2019-07-12 HISTORY — PX: I & D EXTREMITY: SHX5045

## 2019-07-12 HISTORY — DX: Iron deficiency anemia, unspecified: D50.9

## 2019-07-12 HISTORY — DX: Chronic diastolic (congestive) heart failure: I50.32

## 2019-07-12 LAB — CBC
HCT: 29.8 % — ABNORMAL LOW (ref 36.0–46.0)
Hemoglobin: 9.3 g/dL — ABNORMAL LOW (ref 12.0–15.0)
MCH: 32.3 pg (ref 26.0–34.0)
MCHC: 31.2 g/dL (ref 30.0–36.0)
MCV: 103.5 fL — ABNORMAL HIGH (ref 80.0–100.0)
Platelets: 134 10*3/uL — ABNORMAL LOW (ref 150–400)
RBC: 2.88 MIL/uL — ABNORMAL LOW (ref 3.87–5.11)
RDW: 14.4 % (ref 11.5–15.5)
WBC: 7.4 10*3/uL (ref 4.0–10.5)
nRBC: 0 % (ref 0.0–0.2)

## 2019-07-12 LAB — BASIC METABOLIC PANEL
Anion gap: 8 (ref 5–15)
BUN: 32 mg/dL — ABNORMAL HIGH (ref 8–23)
CO2: 26 mmol/L (ref 22–32)
Calcium: 9.2 mg/dL (ref 8.9–10.3)
Chloride: 96 mmol/L — ABNORMAL LOW (ref 98–111)
Creatinine, Ser: 1.46 mg/dL — ABNORMAL HIGH (ref 0.44–1.00)
GFR calc Af Amer: 37 mL/min — ABNORMAL LOW (ref >60)
GFR calc non Af Amer: 32 mL/min — ABNORMAL LOW (ref >60)
Glucose, Bld: 109 mg/dL — ABNORMAL HIGH (ref 70–99)
Potassium: 4.9 mmol/L (ref 3.5–5.1)
Sodium: 130 mmol/L — ABNORMAL LOW (ref 135–145)

## 2019-07-12 LAB — SARS CORONAVIRUS 2 BY RT PCR (HOSPITAL ORDER, PERFORMED IN ~~LOC~~ HOSPITAL LAB): SARS Coronavirus 2: NEGATIVE

## 2019-07-12 SURGERY — OPEN REDUCTION INTERNAL FIXATION (ORIF) ANKLE FRACTURE
Anesthesia: General | Site: Ankle | Laterality: Right

## 2019-07-12 MED ORDER — FENTANYL CITRATE (PF) 100 MCG/2ML IJ SOLN: 25.0000 ug | INTRAMUSCULAR | Status: DC | PRN

## 2019-07-12 MED ORDER — LEVOTHYROXINE SODIUM 125 MCG PO TABS
125.0000 ug | ORAL_TABLET | Freq: Every day | ORAL | Status: DC
  Administered 2019-07-13 – 2019-07-15 (×3): 125 ug via ORAL
  Filled 2019-07-12 (×3): qty 1

## 2019-07-12 MED ORDER — FENTANYL CITRATE (PF) 100 MCG/2ML IJ SOLN
INTRAMUSCULAR | Status: AC
  Filled 2019-07-12: qty 2

## 2019-07-12 MED ORDER — EPHEDRINE 5 MG/ML INJ
INTRAVENOUS | Status: AC
  Filled 2019-07-12: qty 10

## 2019-07-12 MED ORDER — METOCLOPRAMIDE HCL 5 MG PO TABS: 5.0000 mg | ORAL_TABLET | Freq: Three times a day (TID) | ORAL | Status: DC | PRN

## 2019-07-12 MED ORDER — VITAMIN D 25 MCG (1000 UNIT) PO TABS
1000.0000 [IU] | ORAL_TABLET | Freq: Every day | ORAL | Status: DC
  Administered 2019-07-13 – 2019-07-15 (×3): 1000 [IU] via ORAL
  Filled 2019-07-12 (×3): qty 1

## 2019-07-12 MED ORDER — LACTATED RINGERS IV SOLN
INTRAVENOUS | Status: DC
  Administered 2019-07-13: 05:00:00 via INTRAVENOUS

## 2019-07-12 MED ORDER — OXYCODONE HCL 5 MG PO TABS: 5.0000 mg | ORAL_TABLET | Freq: Four times a day (QID) | ORAL | Status: DC | PRN

## 2019-07-12 MED ORDER — DILTIAZEM HCL ER COATED BEADS 120 MG PO CP24
120.0000 mg | ORAL_CAPSULE | Freq: Two times a day (BID) | ORAL | Status: DC
  Administered 2019-07-12 – 2019-07-15 (×6): 120 mg via ORAL
  Filled 2019-07-12 (×6): qty 1

## 2019-07-12 MED ORDER — DEXAMETHASONE SODIUM PHOSPHATE 10 MG/ML IJ SOLN
INTRAMUSCULAR | Status: AC
  Filled 2019-07-12: qty 1

## 2019-07-12 MED ORDER — SUCCINYLCHOLINE CHLORIDE 200 MG/10ML IV SOSY
PREFILLED_SYRINGE | INTRAVENOUS | Status: AC
  Filled 2019-07-12: qty 10

## 2019-07-12 MED ORDER — ONDANSETRON HCL 4 MG PO TABS: 4.0000 mg | ORAL_TABLET | Freq: Four times a day (QID) | ORAL | Status: DC | PRN

## 2019-07-12 MED ORDER — APIXABAN 5 MG PO TABS: 5.0000 mg | ORAL_TABLET | Freq: Two times a day (BID) | ORAL | Status: DC

## 2019-07-12 MED ORDER — BUPIVACAINE-EPINEPHRINE (PF) 0.5% -1:200000 IJ SOLN
INTRAMUSCULAR | Status: DC | PRN
  Administered 2019-07-12: 30 mL via PERINEURAL

## 2019-07-12 MED ORDER — LACTATED RINGERS IV SOLN
INTRAVENOUS | Status: DC
  Administered 2019-07-12: 15:00:00 via INTRAVENOUS

## 2019-07-12 MED ORDER — PROPOFOL 10 MG/ML IV BOLUS
INTRAVENOUS | Status: DC | PRN
  Administered 2019-07-12: 80 mg via INTRAVENOUS

## 2019-07-12 MED ORDER — ACETAMINOPHEN 325 MG PO TABS
650.0000 mg | ORAL_TABLET | Freq: Four times a day (QID) | ORAL | Status: DC | PRN
  Administered 2019-07-12 – 2019-07-15 (×7): 650 mg via ORAL
  Filled 2019-07-12 (×7): qty 2

## 2019-07-12 MED ORDER — FENTANYL CITRATE (PF) 100 MCG/2ML IJ SOLN
50.0000 ug | Freq: Once | INTRAMUSCULAR | Status: AC
  Administered 2019-07-12: 50 ug via INTRAVENOUS
  Filled 2019-07-12: qty 2

## 2019-07-12 MED ORDER — CITALOPRAM HYDROBROMIDE 20 MG PO TABS
20.0000 mg | ORAL_TABLET | Freq: Every day | ORAL | Status: DC
  Administered 2019-07-13 – 2019-07-15 (×3): 20 mg via ORAL
  Filled 2019-07-12 (×3): qty 1

## 2019-07-12 MED ORDER — ZOLPIDEM TARTRATE 5 MG PO TABS: 5.0000 mg | ORAL_TABLET | Freq: Every evening | ORAL | Status: DC | PRN

## 2019-07-12 MED ORDER — SODIUM CHLORIDE 0.9% FLUSH: 3.0000 mL | Freq: Two times a day (BID) | INTRAVENOUS | Status: DC

## 2019-07-12 MED ORDER — CEFAZOLIN SODIUM-DEXTROSE 1-4 GM/50ML-% IV SOLN
1.0000 g | Freq: Two times a day (BID) | INTRAVENOUS | Status: AC
  Administered 2019-07-13 (×2): 1 g via INTRAVENOUS
  Filled 2019-07-12 (×2): qty 50

## 2019-07-12 MED ORDER — POLYETHYLENE GLYCOL 3350 17 G PO PACK
17.0000 g | PACK | Freq: Every day | ORAL | Status: DC | PRN
  Administered 2019-07-14: 21:00:00 17 g via ORAL
  Filled 2019-07-12: qty 1

## 2019-07-12 MED ORDER — MIDAZOLAM HCL 2 MG/2ML IJ SOLN
INTRAMUSCULAR | Status: AC
  Filled 2019-07-12: qty 2

## 2019-07-12 MED ORDER — LIDOCAINE 2% (20 MG/ML) 5 ML SYRINGE
INTRAMUSCULAR | Status: DC | PRN
  Administered 2019-07-12: 40 mg via INTRAVENOUS

## 2019-07-12 MED ORDER — 0.9 % SODIUM CHLORIDE (POUR BTL) OPTIME
TOPICAL | Status: DC | PRN
  Administered 2019-07-12: 1000 mL

## 2019-07-12 MED ORDER — LOSARTAN POTASSIUM 50 MG PO TABS
100.0000 mg | ORAL_TABLET | Freq: Every day | ORAL | Status: DC
  Administered 2019-07-13 – 2019-07-15 (×3): 100 mg via ORAL
  Filled 2019-07-12 (×3): qty 2

## 2019-07-12 MED ORDER — FENTANYL CITRATE (PF) 100 MCG/2ML IJ SOLN
INTRAMUSCULAR | Status: DC | PRN
  Administered 2019-07-12: 50 ug via INTRAVENOUS

## 2019-07-12 MED ORDER — CEFAZOLIN SODIUM-DEXTROSE 2-3 GM-%(50ML) IV SOLR
INTRAVENOUS | Status: DC | PRN
  Administered 2019-07-12: 2 g via INTRAVENOUS

## 2019-07-12 MED ORDER — FENTANYL CITRATE (PF) 250 MCG/5ML IJ SOLN
INTRAMUSCULAR | Status: AC
  Filled 2019-07-12: qty 5

## 2019-07-12 MED ORDER — BISACODYL 10 MG RE SUPP: 10.0000 mg | Freq: Every day | RECTAL | Status: DC | PRN

## 2019-07-12 MED ORDER — SODIUM CHLORIDE 0.9 % IV SOLN
INTRAVENOUS | Status: DC | PRN
  Administered 2019-07-12: 50 ug/min via INTRAVENOUS

## 2019-07-12 MED ORDER — VANCOMYCIN HCL 10 G IV SOLR
1500.0000 mg | Freq: Once | INTRAVENOUS | Status: AC
  Administered 2019-07-12: 1500 mg via INTRAVENOUS
  Filled 2019-07-12: qty 1500

## 2019-07-12 MED ORDER — SUCCINYLCHOLINE CHLORIDE 20 MG/ML IJ SOLN
INTRAMUSCULAR | Status: DC | PRN
  Administered 2019-07-12: 80 mg via INTRAVENOUS

## 2019-07-12 MED ORDER — ONDANSETRON HCL 4 MG/2ML IJ SOLN: 4.0000 mg | Freq: Four times a day (QID) | INTRAMUSCULAR | Status: DC | PRN

## 2019-07-12 MED ORDER — APIXABAN 5 MG PO TABS
5.0000 mg | ORAL_TABLET | Freq: Two times a day (BID) | ORAL | Status: DC
  Administered 2019-07-13 – 2019-07-15 (×5): 5 mg via ORAL
  Filled 2019-07-12 (×5): qty 1

## 2019-07-12 MED ORDER — FENTANYL CITRATE (PF) 100 MCG/2ML IJ SOLN
50.0000 ug | Freq: Once | INTRAMUSCULAR | Status: AC
  Administered 2019-07-12: 10:00:00 50 ug via INTRAVENOUS
  Filled 2019-07-12: qty 2

## 2019-07-12 MED ORDER — DOCUSATE SODIUM 100 MG PO CAPS
100.0000 mg | ORAL_CAPSULE | Freq: Two times a day (BID) | ORAL | Status: DC
  Administered 2019-07-12 – 2019-07-15 (×5): 100 mg via ORAL
  Filled 2019-07-12 (×6): qty 1

## 2019-07-12 MED ORDER — OXYCODONE HCL 5 MG PO TABS: 10.0000 mg | ORAL_TABLET | ORAL | Status: DC | PRN

## 2019-07-12 MED ORDER — LACTATED RINGERS IV SOLN
INTRAVENOUS | Status: DC | PRN
  Administered 2019-07-12: 15:00:00 via INTRAVENOUS

## 2019-07-12 MED ORDER — SODIUM CHLORIDE 0.9 % IV SOLN: 250.0000 mL | INTRAVENOUS | Status: DC | PRN

## 2019-07-12 MED ORDER — MAGNESIUM OXIDE 400 (241.3 MG) MG PO TABS
400.0000 mg | ORAL_TABLET | Freq: Every day | ORAL | Status: DC
  Administered 2019-07-13 – 2019-07-15 (×3): 400 mg via ORAL
  Filled 2019-07-12 (×3): qty 1

## 2019-07-12 MED ORDER — METOCLOPRAMIDE HCL 5 MG/ML IJ SOLN: 5.0000 mg | Freq: Three times a day (TID) | INTRAMUSCULAR | Status: DC | PRN

## 2019-07-12 MED ORDER — DEXAMETHASONE SODIUM PHOSPHATE 10 MG/ML IJ SOLN
INTRAMUSCULAR | Status: DC | PRN
  Administered 2019-07-12: 4 mg via INTRAVENOUS

## 2019-07-12 MED ORDER — FERROUS SULFATE 325 (65 FE) MG PO TABS
325.0000 mg | ORAL_TABLET | Freq: Two times a day (BID) | ORAL | Status: DC
  Administered 2019-07-12 – 2019-07-15 (×6): 325 mg via ORAL
  Filled 2019-07-12 (×6): qty 1

## 2019-07-12 MED ORDER — ONDANSETRON HCL 4 MG/2ML IJ SOLN
INTRAMUSCULAR | Status: DC | PRN
  Administered 2019-07-12: 4 mg via INTRAVENOUS

## 2019-07-12 MED ORDER — ONDANSETRON HCL 4 MG/2ML IJ SOLN
INTRAMUSCULAR | Status: AC
  Filled 2019-07-12: qty 2

## 2019-07-12 MED ORDER — FUROSEMIDE 20 MG PO TABS
20.0000 mg | ORAL_TABLET | ORAL | Status: DC
  Administered 2019-07-13 – 2019-07-15 (×2): 20 mg via ORAL
  Filled 2019-07-12 (×2): qty 1

## 2019-07-12 MED ORDER — ETOMIDATE 2 MG/ML IV SOLN
10.0000 mg | Freq: Once | INTRAVENOUS | Status: AC
  Administered 2019-07-12: 10 mg via INTRAVENOUS
  Filled 2019-07-12: qty 10

## 2019-07-12 MED ORDER — SODIUM CHLORIDE 0.9% FLUSH: 3.0000 mL | INTRAVENOUS | Status: DC | PRN

## 2019-07-12 MED ORDER — LIDOCAINE 2% (20 MG/ML) 5 ML SYRINGE
INTRAMUSCULAR | Status: AC
  Filled 2019-07-12: qty 5

## 2019-07-12 MED ORDER — BUPIVACAINE-EPINEPHRINE (PF) 0.25% -1:200000 IJ SOLN
INTRAMUSCULAR | Status: DC | PRN
  Administered 2019-07-12: 20 mL via PERINEURAL

## 2019-07-12 MED ORDER — OXYCODONE HCL 5 MG PO TABS: 5.0000 mg | ORAL_TABLET | ORAL | Status: DC | PRN

## 2019-07-12 MED ORDER — CEFAZOLIN SODIUM 1 G IJ SOLR
INTRAMUSCULAR | Status: AC
  Filled 2019-07-12: qty 20

## 2019-07-12 MED ORDER — METOPROLOL SUCCINATE ER 25 MG PO TB24
25.0000 mg | ORAL_TABLET | Freq: Every day | ORAL | Status: DC
  Administered 2019-07-13 – 2019-07-15 (×3): 25 mg via ORAL
  Filled 2019-07-12 (×3): qty 1

## 2019-07-12 MED ORDER — SODIUM CHLORIDE 0.9 % IR SOLN
Status: DC | PRN
  Administered 2019-07-12: 3000 mL

## 2019-07-12 MED ORDER — MAGNESIUM CITRATE PO SOLN: 1.0000 | Freq: Once | ORAL | Status: DC | PRN

## 2019-07-12 MED ORDER — PHENYLEPHRINE 40 MCG/ML (10ML) SYRINGE FOR IV PUSH (FOR BLOOD PRESSURE SUPPORT)
PREFILLED_SYRINGE | INTRAVENOUS | Status: AC
  Filled 2019-07-12: qty 10

## 2019-07-12 MED ORDER — ACETAMINOPHEN 650 MG RE SUPP: 650.0000 mg | Freq: Four times a day (QID) | RECTAL | Status: DC | PRN

## 2019-07-12 MED ORDER — PROPOFOL 10 MG/ML IV BOLUS
INTRAVENOUS | Status: AC
  Filled 2019-07-12: qty 20

## 2019-07-12 MED ORDER — VITAMIN B-1 100 MG PO TABS
100.0000 mg | ORAL_TABLET | Freq: Every day | ORAL | Status: DC
  Administered 2019-07-13 – 2019-07-15 (×3): 100 mg via ORAL
  Filled 2019-07-12 (×3): qty 1

## 2019-07-12 MED ORDER — SENNA 8.6 MG PO TABS
1.0000 | ORAL_TABLET | Freq: Two times a day (BID) | ORAL | Status: DC
  Administered 2019-07-12 – 2019-07-15 (×5): 8.6 mg via ORAL
  Filled 2019-07-12 (×6): qty 1

## 2019-07-12 SURGICAL SUPPLY — 54 items
ALCOHOL 70% 16 OZ (MISCELLANEOUS) ×3 IMPLANT
BANDAGE ESMARK 6X9 LF (GAUZE/BANDAGES/DRESSINGS) ×1 IMPLANT
BIT DRILL 2.5X2.75 QC CALB (BIT) ×2 IMPLANT
BLADE SURG 15 STRL LF DISP TIS (BLADE) ×1 IMPLANT
BLADE SURG 15 STRL SS (BLADE) ×3
BNDG CMPR 9X6 STRL LF SNTH (GAUZE/BANDAGES/DRESSINGS) ×1
BNDG COHESIVE 4X5 TAN STRL (GAUZE/BANDAGES/DRESSINGS) ×3 IMPLANT
BNDG COHESIVE 6X5 TAN STRL LF (GAUZE/BANDAGES/DRESSINGS) ×2 IMPLANT
BNDG ESMARK 6X9 LF (GAUZE/BANDAGES/DRESSINGS) ×3
COVER SURGICAL LIGHT HANDLE (MISCELLANEOUS) ×3 IMPLANT
CUFF TOURN SGL QUICK 42 (TOURNIQUET CUFF) ×2 IMPLANT
DRAPE OEC MINIVIEW 54X84 (DRAPES) ×3 IMPLANT
DRAPE U-SHAPE 47X51 STRL (DRAPES) ×3 IMPLANT
DRSG MEPITEL 4X7.2 (GAUZE/BANDAGES/DRESSINGS) ×2 IMPLANT
ELECT REM PT RETURN 9FT ADLT (ELECTROSURGICAL) ×3
ELECTRODE REM PT RTRN 9FT ADLT (ELECTROSURGICAL) ×1 IMPLANT
GAUZE SPONGE 4X4 12PLY STRL (GAUZE/BANDAGES/DRESSINGS) ×2 IMPLANT
GLOVE BIO SURGEON STRL SZ8 (GLOVE) ×3 IMPLANT
GLOVE BIOGEL PI IND STRL 8 (GLOVE) ×1 IMPLANT
GLOVE BIOGEL PI INDICATOR 8 (GLOVE) ×2
GOWN STRL REUS W/ TWL XL LVL3 (GOWN DISPOSABLE) ×2 IMPLANT
GOWN STRL REUS W/TWL XL LVL3 (GOWN DISPOSABLE) ×6
K-WIRE ACE 1.6X6 (WIRE) ×9
KIT BASIN OR (CUSTOM PROCEDURE TRAY) ×3 IMPLANT
KIT TURNOVER KIT B (KITS) ×3 IMPLANT
KWIRE ACE 1.6X6 (WIRE) IMPLANT
NS IRRIG 1000ML POUR BTL (IV SOLUTION) ×3 IMPLANT
PACK ORTHO EXTREMITY (CUSTOM PROCEDURE TRAY) ×3 IMPLANT
PAD ABD 8X10 STRL (GAUZE/BANDAGES/DRESSINGS) ×4 IMPLANT
PAD ARMBOARD 7.5X6 YLW CONV (MISCELLANEOUS) ×6 IMPLANT
PAD CAST 4YDX4 CTTN HI CHSV (CAST SUPPLIES) IMPLANT
PADDING CAST COTTON 4X4 STRL (CAST SUPPLIES) ×3
PADDING CAST COTTON 6X4 STRL (CAST SUPPLIES) ×2 IMPLANT
PLATE LOCK 7H 92 BILAT FIB (Plate) ×2 IMPLANT
PUTTY DBM STAGRAFT PLUS 2CC (Putty) ×2 IMPLANT
SCREW ACE CAN 4.0 40M (Screw) ×4 IMPLANT
SCREW CORTICAL LOW PROF 3.5X20 (Screw) ×2 IMPLANT
SCREW LOCK 3.5X14 DIST TIB (Screw) ×2 IMPLANT
SCREW LOCK CORT STAR 3.5X16 (Screw) ×4 IMPLANT
SCREW LOW PROFILE 3.5X48MM (Screw) ×2 IMPLANT
SCREW LP 3.5 (Screw) ×4 IMPLANT
SET CYSTO W/LG BORE CLAMP LF (SET/KITS/TRAYS/PACK) ×2 IMPLANT
SPLINT PLASTER CAST XFAST 5X30 (CAST SUPPLIES) IMPLANT
SPLINT PLASTER XFAST SET 5X30 (CAST SUPPLIES) ×4
SUCTION FRAZIER HANDLE 10FR (MISCELLANEOUS) ×2
SUCTION TUBE FRAZIER 10FR DISP (MISCELLANEOUS) ×1 IMPLANT
SUT ETHILON 3 0 PS 1 (SUTURE) ×7 IMPLANT
SUT MNCRL AB 3-0 PS2 18 (SUTURE) ×4 IMPLANT
SUT VIC AB 2-0 CT1 27 (SUTURE) ×3
SUT VIC AB 2-0 CT1 TAPERPNT 27 (SUTURE) ×2 IMPLANT
TOWEL GREEN STERILE (TOWEL DISPOSABLE) ×3 IMPLANT
TOWEL GREEN STERILE FF (TOWEL DISPOSABLE) ×3 IMPLANT
TUBE CONNECTING 12'X1/4 (SUCTIONS) ×1
TUBE CONNECTING 12X1/4 (SUCTIONS) ×2 IMPLANT

## 2019-07-12 NOTE — Anesthesia Procedure Notes (Signed)
Procedure Name: Intubation Date/Time: 07/12/2019 3:46 PM Performed by: Suzy Bouchard, CRNA Pre-anesthesia Checklist: Patient identified, Emergency Drugs available, Suction available, Patient being monitored and Timeout performed Patient Re-evaluated:Patient Re-evaluated prior to induction Oxygen Delivery Method: Circle system utilized Preoxygenation: Pre-oxygenation with 100% oxygen Induction Type: IV induction, Rapid sequence and Cricoid Pressure applied Laryngoscope Size: Miller and 2 Grade View: Grade I Tube type: Oral Tube size: 7.0 mm Number of attempts: 1 Airway Equipment and Method: Stylet Placement Confirmation: ETT inserted through vocal cords under direct vision,  positive ETCO2 and breath sounds checked- equal and bilateral Secured at: 22 cm Tube secured with: Tape Dental Injury: Teeth and Oropharynx as per pre-operative assessment

## 2019-07-12 NOTE — H&P (Signed)
History and Physical    Katherine Tyler INO:[id number] DOB: October 23, 1929 DOA: 07/12/2019  PCP: Sharion Balloon, FNP  Patient coming from: Home  I have personally briefly reviewed patient's old medical records in [place]  Chief Complaint: Fall with leg injury  HPI: Katherine Tyler is a 85 y.o. female with medical history significant of hypertension, hyperlipidemia, atrial fibrillation, grade 2 chronic diastolic congestive heart failure, obesity, gastroesophageal reflux disease, hypothyroidism, hyponatremia, iron deficiency anemia and hyperkalemia who presents to the ER today with a chief complaint of a fall.  She has had some right ankle trouble for quite a while.  She was walking in her home shortly prior to arrival when she put weight on her right ankle and it gave way.  She fell backwards onto her buttocks.  She has no pain or trauma from the fall itself.  She states that her ankle broke with the bone showing through the skin prior to the fall as she was putting weight on it.  She denies any head trauma.  She has no loss of consciousness.  No neck pain, no back pain, no chest pain, no shortness of breath, no other injuries other than her ankle.  On route EMS gave the patient 10 mg of morphine.  ED Course: Displaced fracture of the distal tibia and fibula dislocation reduced in the emergency department orthopedics consulted who feel patient needs to go to the OR today.  Case discussed with orthopedics who referred to medicine for admission.  Review of Systems: As per HPI otherwise all other systems reviewed and  negative.   Past Medical History:  Diagnosis Date   Anxiety    Arthritis    Chronic diastolic CHF (congestive heart failure) (HCC)    GERD (gastroesophageal reflux disease)    Hiatal hernia    Hyperkalemia    Hyperlipidemia    Hypertension    Hypothyroidism    Iron deficiency anemia    Obesity    PAF (paroxysmal atrial fibrillation) ([place])    a. diagnosed in 01/2018.  Started on Eliquis for anticoagulation.    Vitamin D deficiency     Past Surgical History:  Procedure Laterality Date   CHOLECYSTECTOMY     TUBAL LIGATION  1960    Social History   Social History Narrative   Lives alone but all 6 of her kids alternate and come by daily. Some in the morning and some in the evening to check on her and help her.     reports that she has never smoked. She quit smokeless tobacco use about 62 years ago.  Her smokeless tobacco use included snuff. She reports that she does not drink alcohol or use drugs.  Allergies  Allergen Reactions   Terazosin Hcl Other (See Comments)    Muscle aches   Keflex [Cephalexin] Other (See Comments)    unknown    Family History  Problem Relation Age of Onset   Heart disease Mother    Stroke Father    Heart disease Sister    Heart disease Brother    Heart disease Sister    Heart disease Sister    Heart disease Brother    Heart disease Brother    Diabetes Daughter     Prior to Admission medications   Medication Sig Start Date End Date Taking? Authorizing Provider  apixaban (ELIQUIS) 5 MG TABS tablet Take 1 tablet (5 mg total) by mouth 2 (two) times daily. 02/09/19  Yes Satira Sark, MD  Cholecalciferol (D3-1000) 1000 UNITS capsule Take 1,000 Units by mouth daily.    Yes [provider]  citalopram (CELEXA) 20 MG tablet Take 1 tablet by mouth once daily Patient taking differently: Take 20 mg by mouth daily.  06/22/19  Yes Hawks, Christy A, FNP  diltiazem (CARDIZEM CD) 120 MG 24 hr capsule Take 1 capsule (120 mg total) by mouth 2 (two) times daily. 02/09/19  Yes Jonelle SidleMcDowell, Samuel G, MD  Ferrous Sulfate Dried (SLOW RELEASE IRON) 45 MG TBCR Take 45 mg by mouth 2 (two) times daily. 08/18/18  Yes Hawks, Christy A, FNP  furosemide (LASIX) 20 MG tablet Take 1 tablet (20 mg total) by mouth every other day. 02/09/19 07/12/19 Yes Jonelle SidleMcDowell, Samuel G, MD  levothyroxine (SYNTHROID, LEVOTHROID) 125 MCG tablet Take  125 mcg by mouth daily before breakfast.   Yes [provider]  losartan (COZAAR) 100 MG tablet Take 1 tablet (100 mg total) by mouth daily. 02/09/19  Yes Jonelle SidleMcDowell, Samuel G, MD  magnesium oxide (MAG-OX) 400 (241.3 MG) MG tablet Take 1 tablet (400 mg total) by mouth daily. 02/14/15  Yes Hawks, Christy A, FNP  metoprolol succinate (TOPROL XL) 25 MG 24 hr tablet Take 1 tablet (25 mg total) by mouth daily. 02/09/19  Yes Jonelle SidleMcDowell, Samuel G, MD  nystatin (MYCOSTATIN/NYSTOP) powder Apply topically 4 (four) times daily. 10/01/17  Yes Hawks, Christy A, FNP  thiamine (VITAMIN B-1) 100 MG tablet Take 100 mg by mouth daily.   Yes [provider]    Physical Exam:  Constitutional: NAD, calm, comfortable after pain meds Vitals:   07/12/19 1440 07/12/19 1445 07/12/19 1450 07/12/19 1455  BP: (!) 185/51 (!) 180/48 (!) 182/50 (!) 173/37  Pulse: 64 62 64 64  Resp: 19 (!) 26 (!) 22 (!) 22  Temp:      TempSrc:      SpO2: 100% 100% 100% 100%  Weight:      Height:       Eyes: PERRL, lids and conjunctivae normal ENMT: Mucous membranes are moist. Posterior pharynx clear of any exudate or lesions.Normal dentition.  Neck: normal, supple, no masses, no thyromegaly Respiratory: clear to auscultation bilaterally, no wheezing, no crackles. Normal respiratory effort. No accessory muscle use.  Cardiovascular: Regular rate and rhythm, no murmurs / rubs / gallops. No extremity edema. 2+ pedal pulses. No carotid bruits.  Abdomen: obese, no tenderness, no masses palpated. No hepatosplenomegaly. Bowel sounds positive.  Musculoskeletal: no clubbing / cyanosis. No joint deformity upper and lower extremities. Good ROM, no contractures. Normal muscle tone.  Skin: no rashes, lesions, ulcers. Bilateral chronic induration of LE with chronic changes of venous stasis Neurologic: CN 2-12 grossly intact. Sensation intact, DTR normal. Strength 5/5 in all 4.  Psychiatric: Normal judgment and insight. Alert and oriented x  3. Normal mood.    Labs on Admission: I have personally reviewed following labs and imaging studies  CBC: Recent Labs  Lab 07/12/19 1026  WBC 7.4  HGB 9.3*  HCT 29.8*  MCV 103.5*  PLT 134*   Basic Metabolic Panel: Recent Labs  Lab 07/12/19 1026  NA 130*  K 4.9  CL 96*  CO2 26  GLUCOSE 109*  BUN 32*  CREATININE 1.46*  CALCIUM 9.2   Urine analysis:    Component Value Date/Time   COLORURINE YELLOW 01/14/2018 2040   APPEARANCEUR HAZY (A) 01/14/2018 2040   LABSPEC 1.006 01/14/2018 2040   PHURINE 5.0 01/14/2018 2040   GLUCOSEU NEGATIVE 01/14/2018 2040   HGBUR NEGATIVE 01/14/2018  2040   BILIRUBINUR NEGATIVE 01/14/2018 2040   KETONESUR NEGATIVE 01/14/2018 2040   PROTEINUR 100 (A) 01/14/2018 2040   UROBILINOGEN 1.0 03/08/2008 1015   NITRITE NEGATIVE 01/14/2018 2040   LEUKOCYTESUR TRACE (A) 01/14/2018 2040    Radiological Exams on Admission: Dg Tibia/fibula Right  Addendum Date: 07/12/2019   ADDENDUM REPORT: 07/12/2019 11:31 ADDENDUM: Study discussed by telephone with Dr. Kennis CarinaMICHAEL BERO on 07/12/2019 at 1121 hours. Electronically Signed   By: Odessa FlemingH  Hall M.D.   On: 07/12/2019 11:31   Result Date: 07/12/2019 CLINICAL DATA:  85 year old female status post fall. EXAM: RIGHT TIBIA AND FIBULA - 2 VIEW COMPARISON:  None. FINDINGS: Four portable views of the right tib fib including the ankle. Joint space loss and degenerative spurring are evident at the right knee. The proximal right tibia, fibula, and midshaft right tibia and fibula appear intact. There is a lateral fracture dislocation of the right ankle with evidence of exposed bone at the distal tibia (image 2). The talar dome is impacted on the lateral aspect of the distal right fibula. The lateral malleolus fracture fragment remains located lateral to the talus. And medial malleolus fracture fragment is also displaced. IMPRESSION: 1. Compound fracture dislocation at the ankle, complete lateral dislocation and displacement of the mortise  joint. 2. The more proximal right tibia and fibula appear intact. Electronically Signed: By: Odessa FlemingH  Hall M.D. On: 07/12/2019 11:16   Dg Ankle 2 Views Right  Result Date: 07/12/2019 CLINICAL DATA:  Fracture and dislocation of the right ankle status post reduction and cast. EXAM: RIGHT ANKLE - 2 VIEW COMPARISON:  None. FINDINGS: Fractures of distal fibula and tibia with dislocation of the right ankle are less displaced compared to pre reduction films. IMPRESSION: Fractures of distal fibula and tibia with dislocation of the right ankle are less displaced compared to pre reduction films. Negative. Electronically Signed   By: Sherian ReinWei-Chen  Lin M.D.   On: 07/12/2019 11:47   Dg Chest Port 1 View  Result Date: 07/12/2019 CLINICAL DATA:  85 year old female status post fall with fracture dislocation at the ankle. EXAM: PORTABLE CHEST 1 VIEW COMPARISON:  01/15/2018 chest radiographs and earlier. FINDINGS: Portable AP semi upright view at 1040 hours. Mildly lower lung volumes. Cardiac size appears more normal today. Calcified aortic atherosclerosis. Other mediastinal contours are within normal limits. Visualized tracheal air column is within normal limits. Allowing for portable technique the lungs are clear. Negative visible bowel gas pattern. Stable cholecystectomy clips. IMPRESSION: Low lung volumes with no acute cardiopulmonary abnormality. Aortic Atherosclerosis (ICD10-I70.0). Electronically Signed   By: Odessa FlemingH  Hall M.D.   On: 07/12/2019 11:18   Dg Foot 2 Views Right  Result Date: 07/12/2019 CLINICAL DATA:  85 year old female status post fall with fracture dislocation at the ankle. EXAM: RIGHT FOOT - 2 VIEW COMPARISON:  Right tib-fib series. FINDINGS: 2 portable views of the right foot. The right ankle deformity is not very apparent on these images. Calcified peripheral vascular disease.  Osteopenia. No superimposed acute tarsal, metatarsal or phalanx fracture identified in the right foot. IMPRESSION: Right ankle fracture  dislocation reported separately. No superimposed acute fracture identified in the right foot. Electronically Signed   By: Odessa FlemingH  Hall M.D.   On: 07/12/2019 11:18    EKG: Independently reviewed.  Sinus rhythm with decreased PR intervals, right bundle branch block, left ventricular hypertrophy with interventricular conduction delay.  Compared to February 09, 2019 there is no change  Assessment/Plan Principal Problem:   Fracture of tibial shaft, open Active Problems:  PAF (paroxysmal atrial fibrillation) (HCC)   Arthritis   Chronic diastolic CHF (congestive heart failure) (HCC)   Obesity   Hypertension   Hyperlipidemia   GERD (gastroesophageal reflux disease)   Anxiety    1.  Fracture of tibial shaft that was open: Reduced in the emergency department.  Going to the operating room today with orthopedics.  (Dr. Victorino DikeHewitt).  Weightbearing status, pain control, DVT prophylaxis per orthopedic surgery.  2.  Paroxysmal atrial fibrillation: Patient is on Eliquis will continue same.  We will also continue her home diltiazem as her rate is well controlled.  3.  Arthritis: Likely cause of her fracture.  I have added on a vitamin D level to see if she qualifies for vitamin D treatment.  4.  Diastolic congestive heart failure: Currently stable and well compensated continue home medication management.  5.  Obesity: Patient would benefit from weight loss as her bones would probably be less painful with regards to her arthritis.  I have consulted nutrition for discussion of appropriate diet.  6.  Hypertension: Currently well controlled on home medication management.  7.  Hyperlipidemia: Currently not on any statin as patient is greater than 83 years old have been shown not to benefit.  8.  Gastroesophageal reflux disease: Really not on any proton pump inhibitor.  Would consider starting 1 if symptoms develop.  9.  Anxiety: Continue home medication management.  DVT prophylaxis: Eliquis Code Status: Full  code Family Communication: No family present.  ED did speak with family. Disposition Plan: Likely SNF for rehab in 3-4 days Consults called: Orthopedics Dr. Victorino DikeHewitt Admission status: Admit - It is my clinical opinion that admission to INPATIENT is reasonable and necessary because of the expectation that this patient will require hospital care that crosses at least 2 midnights to treat this condition based on the medical complexity of the problems presented.  Given the aforementioned information, the predictability of an adverse outcome is felt to be significant.    Lahoma Crockerheresa C Niketa Turner MD FACP [facility] Pager [phone]  How to contact the [facility] Attending or Consulting provider 7A - 7P or covering provider during after hours 7P -7A, for this patient?  1. Check the care team in [facility] and look for a) attending/consulting [facility] provider listed and b) the [facility] team listed 2. Log into [email or web address] and use 's universal password to access. If you do not have the password, please contact the hospital operator. 3. Locate the [facility] provider you are looking for under [facility] and page to a number that you can be directly reached. 4. If you still have difficulty reaching the provider, please page the [facility] (Director on Call) for the Hospitalists listed on amion for assistance.  If 7PM-7AM, please contact night-coverage [email or web address] Password [email or web address]  07/12/2019, 3:21 PM

## 2019-07-12 NOTE — Progress Notes (Signed)
Received Pt alert and awake in bed, vital signs taken and recorded, denies pain 0/10, dinner tray ordered, endorsed accordingly.

## 2019-07-12 NOTE — Anesthesia Preprocedure Evaluation (Addendum)
Anesthesia Evaluation  Patient identified by MRN, date of birth, ID band Patient awake    Reviewed: Allergy & Precautions, NPO status , Patient's Chart, lab work & pertinent test results  Airway Mallampati: I  TM Distance: >3 FB Neck ROM: Full    Dental  (+) Edentulous Upper, Edentulous Lower   Pulmonary neg pulmonary ROS,    breath sounds clear to auscultation       Cardiovascular hypertension, Pt. on medications and Pt. on home beta blockers +CHF   Rhythm:Regular Rate:Normal     Neuro/Psych Anxiety negative neurological ROS     GI/Hepatic Neg liver ROS, hiatal hernia, GERD  ,  Endo/Other  Hypothyroidism   Renal/GU negative Renal ROS     Musculoskeletal  (+) Arthritis ,   Abdominal (+) + obese,   Peds  Hematology negative hematology ROS (+)   Anesthesia Other Findings   Reproductive/Obstetrics                           Anesthesia Physical Anesthesia Plan  ASA: III  Anesthesia Plan: General   Post-op Pain Management: GA combined w/ Regional for post-op pain   Induction: Intravenous  PONV Risk Score and Plan: 4 or greater and Ondansetron and Treatment may vary due to age or medical condition  Airway Management Planned: Oral ETT  Additional Equipment: None  Intra-op Plan:   Post-operative Plan: Extubation in OR  Informed Consent: I have reviewed the patients History and Physical, chart, labs and discussed the procedure including the risks, benefits and alternatives for the proposed anesthesia with the patient or authorized representative who has indicated his/her understanding and acceptance.     Dental advisory given  Plan Discussed with: CRNA  Anesthesia Plan Comments:        Anesthesia Quick Evaluation

## 2019-07-12 NOTE — Anesthesia Postprocedure Evaluation (Signed)
Anesthesia Post Note  Patient: Katherine Tyler  Procedure(s) Performed: IRRIGATION DEBRIDEMENTOPEN  AND REDUCTION INTERNAL FIXATION (ORIF) ANKLE FRACTURE (Right Ankle)     Patient location during evaluation: PACU Anesthesia Type: General Level of consciousness: awake and alert Pain management: pain level controlled Vital Signs Assessment: post-procedure vital signs reviewed and stable Respiratory status: spontaneous breathing, nonlabored ventilation, respiratory function stable and patient connected to nasal cannula oxygen Cardiovascular status: blood pressure returned to baseline and stable Postop Assessment: no apparent nausea or vomiting Anesthetic complications: no    Last Vitals:  Vitals:   07/12/19 1815 07/12/19 1831  BP: (!) 160/75 (!) 160/62  Pulse: (!) 56 60  Resp: (!) 21 16  Temp: 36.6 C 36.5 C  SpO2: 95% 93%    Last Pain:  Vitals:   07/12/19 1831  TempSrc: Oral  PainSc:                  Effie Berkshire

## 2019-07-12 NOTE — Consult Note (Addendum)
Reason for Consult:  Right ankle injury Referring Physician:  Dr. Shona NeedlesBero  Fern S Zaugg is an 85 y.o. female.  HPI:  The patient is an 85 y/o female with PMH of coronary artery disease.  She fell at home this morning injuring her right ankle.  She does not recall exactly how this happened.  She last ate at 730 this morning.  She is not diabetic.  She is not a smoker.  She lives alone but has children who live nearby.  She complains of aching pain in the ankle.  Pain is relieved now that she is in the splint.  Pain was worse when her ankle was initially injured.  She takes no blood thinners.  Past Medical History:  Diagnosis Date  . Anxiety   . Arthritis   . GERD (gastroesophageal reflux disease)   . Hiatal hernia   . Hyperlipidemia   . Hypertension   . Hypothyroidism   . Obesity   . PAF (paroxysmal atrial fibrillation) (HCC)    a. diagnosed in 01/2018. Started on Eliquis for anticoagulation.   . Vitamin D deficiency     Past Surgical History:  Procedure Laterality Date  . CHOLECYSTECTOMY    . TUBAL LIGATION  1960    Family History  Problem Relation Age of Onset  . Heart disease Mother   . Stroke Father   . Heart disease Sister   . Heart disease Brother   . Heart disease Sister   . Heart disease Sister   . Heart disease Brother   . Heart disease Brother   . Diabetes Daughter     Social History:  reports that she has never smoked. She quit smokeless tobacco use about 62 years ago.  Her smokeless tobacco use included snuff. She reports that she does not drink alcohol or use drugs.  Allergies:  Allergies  Allergen Reactions  . Terazosin Hcl Other (See Comments)    Muscle aches  . Keflex [Cephalexin] Other (See Comments)    unknown    Medications: I have reviewed the patient's current medications.  Results for orders placed or performed during the hospital encounter of 07/12/19 (from the past 48 hour(s))  SARS Coronavirus 2 [facility] [place] [facility](Hospital order, Performed [facility]) Nasopharyngeal Nasopharyngeal Swab     Status: None   Collection Time: 07/12/19 10:13 AM   Specimen: Nasopharyngeal Swab  Result Value Ref Range   SARS Coronavirus 2 NEGATIVE NEGATIVE    Comment: (NOTE) If result is NEGATIVE SARS-CoV-2 target nucleic acids are NOT DETECTED. The SARS-CoV-2 RNA is generally detectable in upper and lower  respiratory specimens during the acute phase of infection. The lowest  concentration of SARS-CoV-2 viral copies this assay can detect is 250  copies / mL. A negative result does not preclude SARS-CoV-2 infection  and should not be used as the sole basis for treatment or other  patient management decisions.  A negative result may occur with  improper specimen collection / handling, submission of specimen other  than nasopharyngeal swab, presence of viral mutation(s) within the  areas targeted by this assay, and inadequate number of viral copies  (<250 copies / mL). A negative result must be combined with clinical  observations, patient history, and epidemiological information. If result is POSITIVE SARS-CoV-2 target nucleic acids are DETECTED. The SARS-CoV-2 RNA is generally detectable in upper and lower  respiratory specimens dur ing the acute phase of infection.  Positive  results are indicative of active infection with SARS-CoV-2.  Clinical  correlation with patient history and other diagnostic information is  necessary to determine patient infection status.  Positive results do  not rule out bacterial infection or co-infection with other viruses. If result is PRESUMPTIVE POSTIVE SARS-CoV-2 nucleic acids MAY BE PRESENT.   A presumptive positive result was obtained on the submitted specimen  and confirmed on repeat testing.  While 2019 novel coronavirus  (SARS-CoV-2) nucleic acids may be present in the submitted sample  additional confirmatory testing may be necessary for epidemiological  and / or clinical management purposes  to  differentiate between  SARS-CoV-2 and other Sarbecovirus currently known to infect humans.  If clinically indicated additional testing with an alternate test  methodology [phone]) is advised. The SARS-CoV-2 RNA is generally  detectable in upper and lower respiratory sp ecimens during the acute  phase of infection. The expected result is Negative. Fact Sheet for Patients:  StrictlyIdeas.no Fact Sheet for Healthcare Providers: BankingDealers.co.za This test is not yet approved or cleared by the [place] FDA and has been authorized for detection and/or diagnosis of SARS-CoV-2 by FDA under an Emergency Use Authorization (EUA).  This EUA will remain in effect (meaning this test can be used) for the duration of the COVID-19 declaration under Section 564(b)(1) of the Act, 21 U.S.C. section 360bbb-3(b)(1), unless the authorization is terminated or revoked sooner. Performed at [facility], [place] [address], [place], [place] [address]   CBC     Status: Abnormal   Collection Time: 07/12/19 10:26 AM  Result Value Ref Range   WBC 7.4 4.0 - 10.5 K/uL   RBC 2.88 (L) 3.87 - 5.11 MIL/uL   Hemoglobin 9.3 (L) 12.0 - 15.0 g/dL   HCT 29.8 (L) 36.0 - 46.0 %   MCV 103.5 (H) 80.0 - 100.0 fL   MCH 32.3 26.0 - 34.0 pg   MCHC 31.2 30.0 - 36.0 g/dL   RDW 14.4 11.5 - 15.5 %   Platelets 134 (L) 150 - 400 K/uL   nRBC 0.0 0.0 - 0.2 %    Comment: Performed at [facility], [place] [address] [place], [place] [address]  Basic metabolic panel     Status: Abnormal   Collection Time: 07/12/19 10:26 AM  Result Value Ref Range   Sodium 130 (L) 135 - 145 mmol/L   Potassium 4.9 3.5 - 5.1 mmol/L   Chloride 96 (L) 98 - 111 mmol/L   CO2 26 22 - 32 mmol/L   Glucose, Bld 109 (H) 70 - 99 mg/dL   BUN 32 (H) 8 - 23 mg/dL   Creatinine, Ser 1.46 (H) 0.44 - 1.00 mg/dL   Calcium 9.2 8.9 - 10.3 mg/dL   GFR calc non Af Amer 32 (L) >60 mL/min   GFR calc Af Amer 37  (L) >60 mL/min   Anion gap 8 5 - 15    Comment: Performed at [place] [address] [place], [place] [address]    Dg Tibia/fibula Right  Addendum Date: 07/12/2019   ADDENDUM REPORT: 07/12/2019 11:31 ADDENDUM: Study discussed by telephone with Dr. Gerlene Fee on 07/12/2019 at 1121 hours. Electronically Signed   By: Genevie Ann M.D.   On: 07/12/2019 11:31   Result Date: 07/12/2019 CLINICAL DATA:  85 year old female status post fall. EXAM: RIGHT TIBIA AND FIBULA - 2 VIEW COMPARISON:  None. FINDINGS: Four portable views of the right tib fib including the ankle. Joint space loss and degenerative spurring are evident at the right knee. The proximal right  tibia, fibula, and midshaft right tibia and fibula appear intact. There is a lateral fracture dislocation of the right ankle with evidence of exposed bone at the distal tibia (image 2). The talar dome is impacted on the lateral aspect of the distal right fibula. The lateral malleolus fracture fragment remains located lateral to the talus. And medial malleolus fracture fragment is also displaced. IMPRESSION: 1. Compound fracture dislocation at the ankle, complete lateral dislocation and displacement of the mortise joint. 2. The more proximal right tibia and fibula appear intact. Electronically Signed: By: Odessa FlemingH  Hall M.D. On: 07/12/2019 11:16   Dg Ankle 2 Views Right  Result Date: 07/12/2019 CLINICAL DATA:  Fracture and dislocation of the right ankle status post reduction and cast. EXAM: RIGHT ANKLE - 2 VIEW COMPARISON:  None. FINDINGS: Fractures of distal fibula and tibia with dislocation of the right ankle are less displaced compared to pre reduction films. IMPRESSION: Fractures of distal fibula and tibia with dislocation of the right ankle are less displaced compared to pre reduction films. Negative. Electronically Signed   By: Sherian ReinWei-Chen  Lin M.D.   On: 07/12/2019 11:47   Dg Chest Port 1 View  Result Date: 07/12/2019 CLINICAL DATA:  85 year old female  status post fall with fracture dislocation at the ankle. EXAM: PORTABLE CHEST 1 VIEW COMPARISON:  01/15/2018 chest radiographs and earlier. FINDINGS: Portable AP semi upright view at 1040 hours. Mildly lower lung volumes. Cardiac size appears more normal today. Calcified aortic atherosclerosis. Other mediastinal contours are within normal limits. Visualized tracheal air column is within normal limits. Allowing for portable technique the lungs are clear. Negative visible bowel gas pattern. Stable cholecystectomy clips. IMPRESSION: Low lung volumes with no acute cardiopulmonary abnormality. Aortic Atherosclerosis (ICD10-I70.0). Electronically Signed   By: Odessa FlemingH  Hall M.D.   On: 07/12/2019 11:18   Dg Foot 2 Views Right  Result Date: 07/12/2019 CLINICAL DATA:  85 year old female status post fall with fracture dislocation at the ankle. EXAM: RIGHT FOOT - 2 VIEW COMPARISON:  Right tib-fib series. FINDINGS: 2 portable views of the right foot. The right ankle deformity is not very apparent on these images. Calcified peripheral vascular disease.  Osteopenia. No superimposed acute tarsal, metatarsal or phalanx fracture identified in the right foot. IMPRESSION: Right ankle fracture dislocation reported separately. No superimposed acute fracture identified in the right foot. Electronically Signed   By: Odessa FlemingH  Hall M.D.   On: 07/12/2019 11:18    ROS:  No recent f/c/n/v/wt loss PE:  Blood pressure (!) 149/38, pulse (!) 54, temperature 98.7 F (37.1 C), temperature source Oral, resp. rate 15, SpO2 99 %. Well-nourished well-developed overweight elderly woman in no apparent distress.  Alert and oriented x4.  Mood and affect are normal.  Extraocular motions are intact.  Respirations are unlabored.  Right lower extremity is splinted.  She has a medial laceration at the distal tibia.  Wrist capillary refill at the toes.  Active plantar flexion and dorsiflexion strength at the toes.  No lymphadenopathy.  Sensibility to light touch is  intact dorsally and plantarly at the forefoot.  Assessment/Plan: Open Right ankle trimal fracture dislocation - to the OR today for irrigation and excisional debridement and ORIF of this open, displaced and unstable right ankle fracture.  The risks and benefits of the alternative treatment options have been discussed in detail.  The patient wishes to proceed with surgery and specifically understands risks of bleeding, infection, nerve damage, blood clots, need for additional surgery, amputation and death.   Toni ArthursJohn Ronae Noell 07/12/2019,  1:26 PM

## 2019-07-12 NOTE — ED Provider Notes (Signed)
[facility] Emergency Department Provider Note MRN:  [id number]  Arrival date & time: 07/12/19     Chief Complaint   Fall and Leg Injury   History of Present Illness   Katherine Tyler is a 85 y.o. year-old female with a history of hypertension, hyperlipidemia, A. fib presenting to the ED with chief complaint of fall.  Patient explains that she has had right ankle trouble for quite a while.  She was walking in her home shortly prior to arrival, put weight on her right ankle and it "gave way".  She fell backwards onto her buttocks, denies any pain or trauma from the fall itself.  She explains that her ankle broke with the bone showing through prior to the fall as she was putting weight on it.  She denies head trauma, no loss of consciousness, no neck pain, no back pain, no chest pain, no shortness of breath, no other injuries other than the ankle.  10 mg morphine from EMS in route.  Review of Systems  A complete 10 system review of systems was obtained and all systems are negative except as noted in the HPI and PMH.   Patient's Health History    Past Medical History:  Diagnosis Date  . Anxiety   . Arthritis   . GERD (gastroesophageal reflux disease)   . Hiatal hernia   . Hyperlipidemia   . Hypertension   . Hypothyroidism   . Obesity   . PAF (paroxysmal atrial fibrillation) (HCC)    a. diagnosed in 01/2018. Started on Eliquis for anticoagulation.   . Vitamin D deficiency     Past Surgical History:  Procedure Laterality Date  . CHOLECYSTECTOMY    . TUBAL LIGATION  1960    Family History  Problem Relation Age of Onset  . Heart disease Mother   . Stroke Father   . Heart disease Sister   . Heart disease Brother   . Heart disease Sister   . Heart disease Sister   . Heart disease Brother   . Heart disease Brother   . Diabetes Daughter     Social History   Socioeconomic History  . Marital status: Widowed    Spouse name: Not on file  . Number of children: 6   . Years of education: 6  . Highest education level: 6th grade  Occupational History  . Occupation: Retired  Engineer, productionocial Needs  . Financial resource strain: Not hard at all  . Food insecurity    Worry: Never true    Inability: Never true  . Transportation needs    Medical: No    Non-medical: No  Tobacco Use  . Smoking status: Never Smoker  . Smokeless tobacco: Former NeurosurgeonUser    Types: Snuff  Substance and Sexual Activity  . Alcohol use: No  . Drug use: No  . Sexual activity: Not Currently    Birth control/protection: Surgical  Lifestyle  . Physical activity    Days per week: 7 days    Minutes per session: 10 min  . Stress: Not at all  Relationships  . Social connections    Talks on phone: More than three times a week    Gets together: More than three times a week    Attends religious service: Never    Active member of club or organization: No    Attends meetings of clubs or organizations: Never    Relationship status: Widowed  . Intimate partner violence    Fear of current  or ex partner: No    Emotionally abused: No    Physically abused: No    Forced sexual activity: No  Other Topics Concern  . Not on file  Social History Narrative   Lives alone but all 6 of her kids alternate and come by daily. Some in the morning and some in the evening to check on her and help her.     Physical Exam  Vital Signs and Nursing Notes reviewed Vitals:   07/12/19 1118 07/12/19 1125  BP: (!) 153/47 (!) 149/38  Pulse: (!) 53 (!) 54  Resp: 19 15  Temp: 98.7 F (37.1 C)   SpO2: 97% 99%    CONSTITUTIONAL: Well-appearing, NAD NEURO:  Alert and oriented x 3, no focal deficits EYES:  eyes equal and reactive ENT/NECK:  no LAD, no JVD CARDIO: Regular rate, well-perfused, normal S1 and S2 PULM:  CTAB no wheezing or rhonchi GI/GU:  normal bowel sounds, non-distended, non-tender MSK/SPINE: Gross deformity to right ankle with lateral deviation of the foot, open fracture/dislocation noted at the  medial malleolus site; patient is able to move her foot and toes without issues, strong cap refill, neurovascularly intact. SKIN:  no rash, atraumatic PSYCH:  Appropriate speech and behavior  Diagnostic and Interventional Summary    EKG Interpretation  Date/Time:  Sunday July 12 2019 09:46:32 EDT Ventricular Rate:  55 PR Interval:    QRS Duration: 128 QT Interval:  470 QTC Calculation: 450 R Axis:   -30 Text Interpretation:  Sinus rhythm Short PR interval Right bundle branch block LVH with IVCD and secondary repol abnrm Confirmed by Kennis CarinaBero, Katherine [phone]([id number]) on 07/12/2019 11:32:48 AM      Labs Reviewed  CBC - Abnormal; Notable for the following components:      Result Value   RBC 2.88 (*)    Hemoglobin 9.3 (*)    HCT 29.8 (*)    MCV 103.5 (*)    Platelets 134 (*)    All other components within normal limits  BASIC METABOLIC PANEL - Abnormal; Notable for the following components:   Sodium 130 (*)    Chloride 96 (*)    Glucose, Bld 109 (*)    BUN 32 (*)    Creatinine, Ser 1.46 (*)    GFR calc non Af Amer 32 (*)    GFR calc Af Amer 37 (*)    All other components within normal limits  SARS CORONAVIRUS 2 (HOSPITAL ORDER, PERFORMED IN [facility])    DG Tibia/Fibula Right  Final Result  Addendum 1 of 1  ADDENDUM REPORT: 07/12/2019 11:31    ADDENDUM:  Study discussed by telephone with Dr. Kennis CarinaMICHAEL BERO on 07/12/2019 at  1121 hours.      Electronically Signed    By: Odessa FlemingH  Hall Tyler.D.    On: 07/12/2019 11:31      Final    DG Foot 2 Views Right  Final Result    DG Chest Port 1 View  Final Result    DG Ankle 2 Views Right    (Results Pending)    Medications  vancomycin (VANCOCIN) 1,500 mg in sodium chloride 0.9 % 500 mL IVPB (1,500 mg Intravenous New Bag/Given 07/12/19 1049)  fentaNYL (SUBLIMAZE) injection 50 mcg (50 mcg Intravenous Given 07/12/19 1014)  etomidate (AMIDATE) injection 10 mg (10 mg Intravenous Given 07/12/19 1109)     .Sedation  Date/Time:  07/12/2019 11:33 AM Performed by: Katherine Tyler, Katherine M, MD Authorized by: Katherine Tyler, Katherine M, MD   Consent:  Consent obtained:  Written and verbal   Consent given by:  Patient   Risks discussed:  Respiratory compromise necessitating ventilatory assistance and intubation and inadequate sedation Universal protocol:    Immediately prior to procedure a time out was called: yes     Patient identity confirmation method:  Arm band, hospital-assigned identification number, provided demographic data and verbally with patient Indications:    Procedure performed:  Dislocation reduction   Procedure necessitating sedation performed by:  Physician performing sedation Pre-sedation assessment:    Time since last food or drink:  4 hours   ASA classification: class 2 - patient with mild systemic disease     Neck mobility: normal     Mouth opening:  3 or more finger widths   Mallampati score:  II - soft palate, uvula, fauces visible   Pre-sedation assessments completed and reviewed: airway patency, cardiovascular function, mental status and respiratory function   Immediate pre-procedure details:    Reassessment: Patient reassessed immediately prior to procedure     Reviewed: vital signs and relevant labs/tests     Verified: bag valve mask available, emergency equipment available, intubation equipment available, IV patency confirmed, oxygen available and suction available   Procedure details (see MAR for exact dosages):    Preoxygenation:  Nasal cannula   Sedation:  Etomidate   Intra-procedure monitoring:  Blood pressure monitoring and cardiac monitor   Intra-procedure events: none     Intra-procedure management:  Airway repositioning   Total Provider sedation time (minutes):  17 Post-procedure details:    Post-sedation assessments completed and reviewed: airway patency, cardiovascular function, mental status and respiratory function     Patient is stable for discharge or admission: yes     Patient tolerance:   Tolerated well, no immediate complications Comments:     10 mg of etomidate used.  Minimal brief hypoxia quickly resolved with chin lift.  Reduction of dislocation  Date/Time: 07/12/2019 11:39 AM Performed by: Katherine Tyler, Katherine M, MD Authorized by: Katherine Tyler, Katherine M, MD  Consent: Verbal consent obtained. Written consent obtained. Risks and benefits: risks, benefits and alternatives were discussed Consent given by: patient Patient understanding: patient states understanding of the procedure being performed Imaging studies: imaging studies available Patient identity confirmed: verbally with patient, arm band, provided demographic data and hospital-assigned identification number Time out: Immediately prior to procedure a "time out" was called to verify the correct patient, procedure, equipment, support staff and site/side marked as required. Local anesthesia used: no  Anesthesia: Local anesthesia used: no  Sedation: Patient sedated: yes Vitals: Vital signs were monitored during sedation.  Patient tolerance: patient tolerated the procedure well with no immediate complications Comments: Open fracture/reduction was washed out with 1 L of sterile saline and then successfully reduced with mild traction.  Strong DP pulse after reduction, brisk cap refill.    Critical Care Critical Care Documentation Critical care time provided by me (excluding procedures): 35 minutes  Condition necessitating critical care: Open fracture and dislocation of the right ankle requiring emergent surgical repair  Components of critical care management: reviewing of prior records, laboratory and imaging interpretation, frequent re-examination and reassessment of vital signs, administration of IV vancomycin, IV opioids, discussion with consulting services    ED Course and Medical Decision Making  I have reviewed the triage vital signs and the nursing notes.  Pertinent labs & imaging results that were available  during my care of the patient were reviewed by me and considered in my medical decision making (see below for details).  Open fracture/dislocation of the right ankle in this 85 year old female, no signs of other trauma, fall seems mechanical as she denies any dizziness or chest pain causing the fall.  Will consult orthopedics for intervention, currently neurovascularly intact.  Tetanus is up-to-date, patient has cephalosporin allergy, will provide vancomycin for surgical prophylaxis.  Reduction and sedation performed as described above, spoke with Dr. Doran Durand of orthopedics, who will post the patient for surgery.  Given patient's comorbidities will admit to hospitalist service.  Katherine Tyler, [place] [email or web address]  Final Clinical Impressions(s) / ED Diagnoses     ICD-10-CM   1. Fall in home, initial encounter  W19.XXXA    Y92.009   2. Type I or II open fracture of right ankle, initial encounter  S82.891B   3. Dislocation of right ankle joint, initial encounter  [id number]     ED Discharge Orders    None         Maudie Flakes, MD 07/12/19 [phone]

## 2019-07-12 NOTE — Op Note (Signed)
07/12/2019  5:50 PM  PATIENT:  Katherine Tyler  85 y.o. female  PRE-OPERATIVE DIAGNOSIS: Open right ankle fracture dislocation  POST-OPERATIVE DIAGNOSIS: 1.  Open right ankle trimalleolar fracture      2.  Right ankle syndesmosis disruption      3.  Medial ankle laceration 15 cm  Procedure(s): 1.  Irrigation and excisional debridement of open right ankle fracture including skin, subcutaneous tissue, muscle and bone 2.  Open treatment of right ankle dislocation 3.  Open treatment of right ankle trimalleolar fracture with internal fixation without fixation of the posterior malleolus 4.  Open treatment of right ankle syndesmosis disruption with internal fixation 5.  Intermediate closure of right medial ankle laceration measuring 15 cm 6.  AP, lateral and mortise radiographs of the right ankle  SURGEON:  Toni ArthursJohn Gibson Lad, MD  ASSISTANT: None  ANESTHESIA:   General, regional  EBL:  minimal   TOURNIQUET:   Total Tourniquet Time Documented: Thigh (Right) - 77 minutes Total: Thigh (Right) - 77 minutes  COMPLICATIONS:  None apparent  DISPOSITION:  Extubated, awake and stable to recovery.  INDICATION FOR PROCEDURE: The patient is an 85 year old female with past medical history significant for coronary artery disease.  She fell at home this morning injuring her right ankle.  She sustained an open right ankle fracture dislocation.  She presented to the emergency room where she underwent an attempt at reduction and splinting.  She presents now for operative treatment of this open dislocated right ankle trimalleolar fracture.  The risks and benefits of the alternative treatment options have been discussed in detail.  The patient wishes to proceed with surgery and specifically understands risks of bleeding, infection, nerve damage, blood clots, need for additional surgery, amputation and death.  PROCEDURE IN DETAIL:  After pre operative consent was obtained, and the correct operative site was identified,  the patient was brought to the operating room and placed supine on the OR table.  Anesthesia was administered.  Pre-operative antibiotics were administered.  A surgical timeout was taken.  The right lower extremity was prepped and draped in standard sterile fashion with a tourniquet around the thigh.  The extremity was elevated and the tourniquet was inflated to 350 mmHg.  Patient's medial ankle laceration was identified.  The distal tibia was identified protruding through this laceration with complete dislocation of the tibiotalar joint.  The joint was reduced.  Excisional debridement was then performed with scissors and scalpel.  All devitalized tissue was removed from the level of the skin down to the subcutaneous tissues, muscle and bone.  The articular cartilage of the talar dome and the tibial plafond appeared generally healthy.  The medial malleolus was then reduced and provisionally pinned.  AP and lateral radiographs showed appropriate alignment of the medial malleolus fracture.  Fracture was then fixed with 2 4 mm partially-threaded cannulated screws from the [employer] small frag set.  Attention was then turned to the lateral aspect of the ankle.  A longitudinal incision was made over the fibula.  Dissection was carried down through the subcutaneous tissues.  A comminuted fibular shaft fracture was identified adjacent to the syndesmosis.  There was disruption of the anterior inferior tibiofibular ligament with avulsion of Chaput's tubercle and gross instability of the syndesmosis.  The fracture was irrigated copiously.  A 7 hole composite plate from the [employer] Alps set was selected.  It was contoured to fit the lateral malleolus.  It was secured distally with 3 locking screws.  It was then  used as a reduction aid pulling the fibula out to length appropriately and provisionally pinning the plate proximally.  AP and lateral radiographs confirmed appropriate restoration of the length of the fibula and  appropriate alignment of the fracture reduction.  The plate was secured to the fibula with 3 screws.  All 3 screws were inserted across the fibula and the distal tibia while holding appropriate reduction of the syndesmosis.  Once the syndesmosis was secured the bony defect in the fibula was assessed.  DBM and cancellus chips were packed into the fracture defect.  Final AP, lateral and mortise radiographs of the ankle show appropriate reduction of the fractures.  The posterior malleolus fracture was appropriately reduced and did not involve any articular surface.  Hardware is appropriately positioned and of the appropriate lengths.  The medial and lateral ankle wounds were then irrigated copiously.  The lateral wound was closed with 3-0 Monocryl and 3-0 nylon.  The medial wound was then addressed.  2-0 Vicryl sutures were used to close the subcutaneous tissues.  The incision was then closed with horizontal mattress sutures of 3-0 nylon.  Sterile dressings were applied followed by a well-padded short leg splint.  The tourniquet was released after application of the dressings.  The patient was awakened from anesthesia and transported to the recovery room in stable condition.   FOLLOW UP PLAN: Nonweightbearing on the right lower extremity.  Admission to the hospitalist service for management of her medical comorbidities.  PT, OT and DVT prophylaxis to start on postop day 1.   RADIOGRAPHS: AP, mortise and lateral radiographs of the right ankle are obtained intraoperatively.  These show interval reduction of the ankle dislocation and of the trimalleolar ankle fracture.  Hardware is appropriately positioned and of the appropriate lengths.

## 2019-07-12 NOTE — Anesthesia Procedure Notes (Signed)
Anesthesia Regional Block: Adductor canal block   Pre-Anesthetic Checklist: ,, timeout performed, Correct Patient, Correct Site, Correct Laterality, Correct Procedure, Correct Position, site marked, Risks and benefits discussed,  Surgical consent,  Pre-op evaluation,  At surgeon's request and post-op pain management  Laterality: Right  Prep: chloraprep       Needles:  Injection technique: Single-shot  Needle Type: Echogenic Stimulator Needle     Needle Length: 9cm  Needle Gauge: 21     Additional Needles:   Procedures:,,,, ultrasound used (permanent image in chart),,,,  Narrative:  Start time: 07/12/2019 2:45 PM End time: 07/12/2019 2:50 PM Injection made incrementally with aspirations every 5 mL.  Performed by: Personally  Anesthesiologist: Effie Berkshire, MD  Additional Notes: Patient tolerated the procedure well. Local anesthetic introduced in an incremental fashion under minimal resistance after negative aspirations. No paresthesias were elicited. After completion of the procedure, no acute issues were identified and patient continued to be monitored by RN.

## 2019-07-12 NOTE — Transfer of Care (Signed)
Immediate Anesthesia Transfer of Care Note  Patient: Katherine Tyler  Procedure(s) Performed: IRRIGATION DEBRIDEMENTOPEN  AND REDUCTION INTERNAL FIXATION (ORIF) ANKLE FRACTURE (Right Ankle)  Patient Location: PACU  Anesthesia Type:General  Level of Consciousness: awake, alert  and oriented  Airway & Oxygen Therapy: Patient Spontanous Breathing and Patient connected to nasal cannula oxygen  Post-op Assessment: Report given to RN and Post -op Vital signs reviewed and stable  Post vital signs: Reviewed and stable  Last Vitals:  Vitals Value Taken Time  BP 162/68 07/12/19 1745  Temp    Pulse 59 07/12/19 1746  Resp 15 07/12/19 1746  SpO2 100 % 07/12/19 1746  Vitals shown include unvalidated device data.  Last Pain:  Vitals:   07/12/19 1455  TempSrc:   PainSc: 0-No pain      Patients Stated Pain Goal: 2 (65/46/50 3546)  Complications: No apparent anesthesia complications.  Glasses to PACU with patient.

## 2019-07-12 NOTE — ED Triage Notes (Signed)
Per [facility]- pt was walking around her table and her "foot gave out." Pt has fx to right tib/fib" Pt takes Eliquious. 18G PIV to right wrist. 10 of Morphine given in route.

## 2019-07-12 NOTE — Progress Notes (Signed)
Report received from Lelon Frohlich, RN from the ED.

## 2019-07-12 NOTE — Progress Notes (Signed)
PHARMACY NOTE:  ANTIMICROBIAL RENAL DOSAGE ADJUSTMENT  Current antimicrobial regimen includes a mismatch between antimicrobial dosage and estimated renal function.  As per policy approved by the Pharmacy & Therapeutics and Medical Executive Committees, the antimicrobial dosage will be adjusted accordingly.  Current antimicrobial dosage: Cefazolin 1g IV q6h x 3 doses post-operatively   Indication: surgical prophylaxis   Renal Function:  Estimated Creatinine Clearance: 31.7 mL/min (A) (by C-G formula based on SCr of 1.46 mg/dL (H)). []      On intermittent HD, scheduled: []      On CRRT    Antimicrobial dosage has been changed to: Cefazolin 1g IV q12h x 2 doses   Additional comments: Noted allergy to Cephalexin causing unknown reaction. However, patient received dose of Cefazolin in OR today (confirmed with RN). RN reports patient tolerated earlier dose without issue.    Thank you for allowing pharmacy to be a part of this patient's care.  Luiz Ochoa, [facility] 07/12/2019 6:58 PM

## 2019-07-12 NOTE — Progress Notes (Signed)
Orthopedic Tech Progress Note Patient Details:  Katherine Tyler September 04, 1929 [id number] APPLIED A SHORT LEG SPLINT TO PATIENT, AFTER REDUCTION. SHE'S GOING TO HAVE SURGERY THIS AFTERNOON. Ortho Devices Type of Ortho Device: Short leg splint Ortho Device/Splint Location: LRE Ortho Device/Splint Interventions: Application, Adjustment, Ordered   Post Interventions Patient Tolerated: Well Instructions Provided: Adjustment of device, Care of device   Janit Pagan 07/12/2019, 11:23 AM

## 2019-07-12 NOTE — Anesthesia Procedure Notes (Signed)
Anesthesia Regional Block: Popliteal block   Pre-Anesthetic Checklist: ,, timeout performed, Correct Patient, Correct Site, Correct Laterality, Correct Procedure, Correct Position, site marked, Risks and benefits discussed,  Surgical consent,  Pre-op evaluation,  At surgeon's request and post-op pain management  Laterality: Right  Prep: chloraprep       Needles:  Injection technique: Single-shot  Needle Type: Echogenic Stimulator Needle     Needle Length: 9cm  Needle Gauge: 21     Additional Needles:   Procedures:,,,, ultrasound used (permanent image in chart),,,,  Narrative:  Start time: 07/12/2019 2:35 PM End time: 07/12/2019 2:45 PM Injection made incrementally with aspirations every 5 mL.  Performed by: Personally  Anesthesiologist: Effie Berkshire, MD  Additional Notes: Patient tolerated the procedure well. Local anesthetic introduced in an incremental fashion under minimal resistance after negative aspirations. No paresthesias were elicited. After completion of the procedure, no acute issues were identified and patient continued to be monitored by RN.

## 2019-07-12 NOTE — Discharge Instructions (Addendum)
Katherine Simmer, MD [facility]  Please read the following information regarding your care after surgery.  Medications  You only need a prescription for the narcotic pain medicine (ex. oxycodone, Percocet, Norco).  All of the other medicines listed below are available over the counter. ? Aleve 2 pills twice a day for the first 3 days after surgery. X acetominophen (Tylenol) 650 mg every 4-6 hours as you need for minor to moderate pain X Tramadol as prescribed for severe pain  Narcotic pain medicine (ex. oxycodone, Percocet, Vicodin) will cause constipation.  To prevent this problem, take the following medicines while you are taking any pain medicine. X docusate sodium (Colace) 100 mg twice a day X senna (Senokot) 2 tablets twice a day  X To help prevent blood clots, take Eliquus as prescribed.  You should also get up every hour while you are awake to move around.    Weight Bearing ? Bear weight when you are able on your operated leg or foot. ? Bear weight only on your operated foot in the post-op shoe. X Do not bear any weight on the operated leg or foot.  Cast / Splint / Dressing X Keep your splint, cast or dressing clean and dry.  Dont put anything (coat hanger, pencil, etc) down inside of it.  If it gets damp, use a hair dryer on the cool setting to dry it.  If it gets soaked, call the office to schedule an appointment for a cast change. ? Remove your dressing 3 days after surgery and cover the incisions with dry dressings.    After your dressing, cast or splint is removed; you may shower, but do not soak or scrub the wound.  Allow the water to run over it, and then gently pat it dry.  Swelling It is normal for you to have swelling where you had surgery.  To reduce swelling and pain, keep your toes above your nose for at least 3 days after surgery.  It may be necessary to keep your foot or leg elevated for several weeks.  If it hurts, it should be elevated.  Follow Up Call my office at  [phone] when you are discharged from the hospital or surgery center to schedule an appointment to be seen two weeks after surgery.  Call my office at [phone] if you develop a fever >101.5 F, nausea, vomiting, bleeding from the surgical site or severe pain.    =======================================================================================  Information on my medicine - ELIQUIS (apixaban)  Why was Eliquis prescribed for you? Eliquis was prescribed for you to reduce the risk of a blood clot forming that can cause a stroke if you have a medical condition called atrial fibrillation (a type of irregular heartbeat).  What do You need to know about Eliquis ? Take your Eliquis TWICE DAILY - one tablet in the morning and one tablet in the evening with or without food. If you have difficulty swallowing the tablet whole please discuss with your pharmacist how to take the medication safely.  Take Eliquis exactly as prescribed by your doctor and DO NOT stop taking Eliquis without talking to the doctor who prescribed the medication.  Stopping may increase your risk of developing a stroke.  Refill your prescription before you run out.  After discharge, you should have regular check-up appointments with your healthcare provider that is prescribing your Eliquis.  In the future your dose may need to be changed if your kidney function or weight changes by a significant amount or as  you get older.  What do you do if you miss a dose? If you miss a dose, take it as soon as you remember on the same day and resume taking twice daily.  Do not take more than one dose of ELIQUIS at the same time to make up a missed dose.  Important Safety Information A possible side effect of Eliquis is bleeding. You should call your healthcare provider right away if you experience any of the following: ? Bleeding from an injury or your nose that does not stop. ? Unusual colored urine (red or dark brown) or  unusual colored stools (red or black). ? Unusual bruising for unknown reasons. ? A serious fall or if you hit your head (even if there is no bleeding).  Some medicines may interact with Eliquis and might increase your risk of bleeding or clotting while on Eliquis. To help avoid this, consult your healthcare provider or pharmacist prior to using any new prescription or non-prescription medications, including herbals, vitamins, non-steroidal anti-inflammatory drugs (NSAIDs) and supplements.  This website has more information on Eliquis (apixaban): http://www.eliquis.com/eliquis/home

## 2019-07-13 ENCOUNTER — Encounter (HOSPITAL_COMMUNITY): Payer: Self-pay | Admitting: *Deleted

## 2019-07-13 DIAGNOSIS — M1990 Unspecified osteoarthritis, unspecified site: Secondary | ICD-10-CM

## 2019-07-13 DIAGNOSIS — K219 Gastro-esophageal reflux disease without esophagitis: Secondary | ICD-10-CM

## 2019-07-13 DIAGNOSIS — E785 Hyperlipidemia, unspecified: Secondary | ICD-10-CM

## 2019-07-13 DIAGNOSIS — Z6841 Body Mass Index (BMI) 40.0 and over, adult: Secondary | ICD-10-CM

## 2019-07-13 DIAGNOSIS — F419 Anxiety disorder, unspecified: Secondary | ICD-10-CM

## 2019-07-13 DIAGNOSIS — I10 Essential (primary) hypertension: Secondary | ICD-10-CM

## 2019-07-13 DIAGNOSIS — I480 Paroxysmal atrial fibrillation: Secondary | ICD-10-CM

## 2019-07-13 DIAGNOSIS — I5032 Chronic diastolic (congestive) heart failure: Secondary | ICD-10-CM

## 2019-07-13 LAB — BASIC METABOLIC PANEL
Anion gap: 8 (ref 5–15)
BUN: 29 mg/dL — ABNORMAL HIGH (ref 8–23)
CO2: 25 mmol/L (ref 22–32)
Calcium: 8.6 mg/dL — ABNORMAL LOW (ref 8.9–10.3)
Chloride: 96 mmol/L — ABNORMAL LOW (ref 98–111)
Creatinine, Ser: 1.42 mg/dL — ABNORMAL HIGH (ref 0.44–1.00)
GFR calc Af Amer: 38 mL/min — ABNORMAL LOW (ref >60)
GFR calc non Af Amer: 33 mL/min — ABNORMAL LOW (ref >60)
Glucose, Bld: 145 mg/dL — ABNORMAL HIGH (ref 70–99)
Potassium: 5.5 mmol/L — ABNORMAL HIGH (ref 3.5–5.1)
Sodium: 129 mmol/L — ABNORMAL LOW (ref 135–145)

## 2019-07-13 LAB — CBC
HCT: 24.1 % — ABNORMAL LOW (ref 36.0–46.0)
Hemoglobin: 7.7 g/dL — ABNORMAL LOW (ref 12.0–15.0)
MCH: 32.5 pg (ref 26.0–34.0)
MCHC: 32 g/dL (ref 30.0–36.0)
MCV: 101.7 fL — ABNORMAL HIGH (ref 80.0–100.0)
Platelets: 130 10*3/uL — ABNORMAL LOW (ref 150–400)
RBC: 2.37 MIL/uL — ABNORMAL LOW (ref 3.87–5.11)
RDW: 14.3 % (ref 11.5–15.5)
WBC: 7 10*3/uL (ref 4.0–10.5)
nRBC: 0 % (ref 0.0–0.2)

## 2019-07-13 NOTE — Evaluation (Signed)
Physical Therapy Evaluation Patient Details Name: Katherine Tyler MRN: [id number] DOB: [birth year] Today's Date: 07/13/2019   History of Present Illness  Patient is an 85 year old female admitted after fall at home. Found to have distal tibia/fibula fx. S/P ORIF 07/12/19. PMH to include: HTN, Obesity, Afib, HLD, Gerd, Anxiety, CHF, OA.  Clinical Impression  Patient received in bed, very plaesant and agrees to PT/OT eval. Patient motivated to improve and wants to return home, however has a lot of difficulty performing basic mobility tasks due to Right LE NWB status. Patient required min assist with supine><sit, mod assist +2 with sit to stand transfer, however difficulty maintaining NWB in standing. Patient unable to take any steps at all. Required max +2 to perform squat pivot from bed >< recliner and required stedy to return back to bed due to fatigue. Patient will benefit from continued skilled PT while here to improve basic mobility skills so that her family can assist her safely once she returns home.       Follow Up Recommendations CIR    Equipment Recommendations  Wheelchair (measurements PT);Wheelchair cushion (measurements PT)    Recommendations for Other Services Rehab consult     Precautions / Restrictions Precautions Precautions: Fall Restrictions Weight Bearing Restrictions: Yes RLE Weight Bearing: Non weight bearing      Mobility  Bed Mobility Overal bed mobility: Needs Assistance Bed Mobility: Supine to Sit;Sit to Supine     Supine to sit: Min assist Sit to supine: Min assist   General bed mobility comments: required min assist for moving right LE on/off bed, min assist to raise trunk to sitting position  Transfers Overall transfer level: Needs assistance Equipment used: Rolling walker (2 wheeled) Transfers: Sit to/from [employer] Sit to Stand: +2 physical assistance;+2 safety/equipment;From elevated surface;Mod assist Stand pivot transfers: +2  physical assistance;+2 safety/equipment;From elevated surface;Max assist Squat pivot transfers: +2 physical assistance;+2 safety/equipment;Max assist     General transfer comment: patient was able to perform sit to stand transfer with +2 mod assist, however was unable to pivot left foot once standing to transfer to the recliner. Performed squat pivot transfer +2 max assist. Required use of stedy to return to bed from recliner as she was fatigued at that point and unable to transfer back with +2 assist. Patient is unable to maintain NWB on right during transfers.  Ambulation/Gait             General Gait Details: unable at this time  Stairs            Wheelchair Mobility    Modified Rankin (Stroke Patients Only)       Balance Overall balance assessment: Needs assistance Sitting-balance support: Feet supported Sitting balance-Leahy Scale: Good     Standing balance support: Bilateral upper extremity supported Standing balance-Leahy Scale: Poor Standing balance comment: difficulty maintaining NWB on right LE in standing                             Pertinent Vitals/Pain Pain Assessment: No/denies pain    Home Living Family/patient expects to be discharged to:: Private residence Living Arrangements: Alone;Children Available Help at Discharge: Family;Available 24 hours/day Type of Home: House Home Access: Stairs to enter Entrance Stairs-Rails: None Entrance Stairs-Number of Steps: 1+1 Home Layout: One level Home Equipment: Walker - 2 wheels;Shower seat - built in;Grab bars - tub/shower;Hand held shower head;Bedside commode      Prior Function Level of Independence:  Needs assistance   Gait / Transfers Assistance Needed: ambulated with RW  ADL's / Homemaking Assistance Needed: assist for socks, otherwise modified independent in ADL, meal prep and light housekeeping        Hand Dominance   Dominant Hand: Right    Extremity/Trunk Assessment         Lower Extremity Assessment Lower Extremity Assessment: Overall WFL for tasks assessed RLE Deficits / Details: patient has 3/5 strength in right LE, otherwise WNL RLE Sensation: WNL       Communication   Communication: HOH  Cognition Arousal/Alertness: Awake/alert Behavior During Therapy: WFL for tasks assessed/performed Overall Cognitive Status: Within Functional Limits for tasks assessed                                        General Comments      Exercises     Assessment/Plan    PT Assessment Patient needs continued PT services  PT Problem List Decreased strength;Decreased mobility;Decreased knowledge of precautions;Decreased activity tolerance;Decreased balance;Decreased safety awareness       PT Treatment Interventions DME instruction;Therapeutic activities;Therapeutic exercise;Patient/family education;Balance training;Functional mobility training;Wheelchair mobility training    PT Goals (Current goals can be found in the Care Plan section)       Frequency Min 3X/week   Barriers to discharge Inaccessible home environment;Decreased caregiver support      Co-evaluation PT/OT/SLP Co-Evaluation/Treatment: Yes Reason for Co-Treatment: For patient/therapist safety;To address functional/ADL transfers PT goals addressed during session: Mobility/safety with mobility;Proper use of DME;Balance         AM-PAC PT "6 Clicks" Mobility  Outcome Measure Help needed turning from your back to your side while in a flat bed without using bedrails?: A Lot Help needed moving from lying on your back to sitting on the side of a flat bed without using bedrails?: A Lot Help needed moving to and from a bed to a chair (including a wheelchair)?: A Lot Help needed standing up from a chair using your arms (e.g., wheelchair or bedside chair)?: A Lot Help needed to walk in hospital room?: Total Help needed climbing 3-5 steps with a railing? : Total 6 Click Score: 10     End of Session Equipment Utilized During Treatment: Gait belt Activity Tolerance: Patient limited by fatigue Patient left: in bed;with bed alarm set;with call bell/phone within reach;with nursing/sitter in room Nurse Communication: Mobility status;Need for lift equipment;Weight bearing status PT Visit Diagnosis: Muscle weakness (generalized) (M62.81);Other abnormalities of gait and mobility (R26.89);History of falling (Z91.81);Unsteadiness on feet (R26.81)    Time: 1100-1140 PT Time Calculation (min) (ACUTE ONLY): 40 min   Charges:   PT Evaluation $PT Eval Moderate Complexity: 1 Mod PT Treatments $Therapeutic Activity: 8-22 mins        Smith InternationalKristyn Ailyn Gladd, PT, GCS 07/13/19,12:22 PM

## 2019-07-13 NOTE — Progress Notes (Signed)
Initial Nutrition Assessment   RD working remotely.  DOCUMENTATION CODES:   Morbid obesity  INTERVENTION:  Provide Ensure Enlive po once daily, each supplement provides 350 kcal and 20 grams of protein.  NUTRITION DIAGNOSIS:   Increased nutrient needs related to post-op healing as evidenced by estimated needs.  GOAL:   Patient will meet greater than or equal to 90% of their needs  MONITOR:   PO intake, Supplement acceptance, Skin, Weight trends, Labs, I & O's  REASON FOR ASSESSMENT:   Consult Assessment of nutrition requirement/status  ASSESSMENT:   85 y.o. female with medical history significant of hypertension, hyperlipidemia, atrial fibrillation, grade 2 chronic diastolic congestive heart failure, obesity, gastroesophageal reflux disease, hypothyroidism, hyponatremia, iron deficiency anemia and hyperkalemia who presents after fall. Pt with displaced fracture of the distal tibia and fibula dislocation  Procedure (8/2): IRRIGATION DEBRIDEMENT OPEN  AND REDUCTION INTERNAL FIXATION (ORIF) ANKLE FRACTURE (Right)  Pt unavailable during attempted time of contact. RD unable to obtain pt nutrition history. Meal completion has been 100%. RD to order nutritional supplements to aid in post op healing.   Unable to complete Nutrition-Focused physical exam at this time.   Labs and medications reviewed.   Diet Order:   Diet Order            Diet regular Room service appropriate? Yes; Fluid consistency: Thin  Diet effective now              EDUCATION NEEDS:   Not appropriate for education at this time  Skin:  Skin Assessment: Skin Integrity Issues: Skin Integrity Issues:: Incisions Incisions: R ankle  Last BM:  7/31  Height:   Ht Readings from Last 1 Encounters:  07/12/19 5\' 3"  (1.6 m)    Weight:   Wt Readings from Last 1 Encounters:  07/12/19 110.2 kg    Ideal Body Weight:  52.27 kg  BMI:  Body mass index is 43.05 kg/m.  Estimated Nutritional Needs:    Kcal:  1650-1800  Protein:  80-90 grams  Fluid:  >/= 1.6 L/day    Corrin Parker, MS, RD, LDN Pager # [phone] After hours/ weekend pager # [phone]

## 2019-07-13 NOTE — TOC Initial Note (Addendum)
Transition of Care [facility]) - Initial/Assessment Note    Patient Details  Name: Katherine Tyler MRN: [id number] Date of Birth: Dec 05, 1929  Transition of Care [facility]) CM/SW Contact:    Marilu Favre, RN Phone Number: 07/13/2019, 12:01 PM  Clinical Narrative:                  Confirmed face sheet information with patient.   Await PT note and recommendations.   Discussed with patient. Patient is from home alone, she does not want to go to SNF for short term rehab. Patient has 4 daughters and 2 sons who will provide 24 hour assistance.   Left Medicare.gov list of home health agencies at bedside. Patient states one of her daughters plans to visit this afternoon. Will continue to follow.   Patient has walker and 3 in 1 at home.  Expected Discharge Plan: [place] Barriers to Discharge: Continued Medical Work up   Patient Goals and CMS Choice Patient states their goals for this hospitalization and ongoing recovery are:: to go home CMS Medicare.gov Compare Post Acute Care list provided to:: Patient Choice offered to / list presented to : Patient  Expected Discharge Plan and Services Expected Discharge Plan: [place]   Discharge Planning Services: CM Consult Post Acute Care Choice: [place] arrangements for the past 2 months: Single Family Home                 DME Arranged: N/A                    Prior Living Arrangements/Services Living arrangements for the past 2 months: Single Family Home Lives with:: Self Patient language and need for interpreter reviewed:: Yes        Need for Family Participation in Patient Care: Yes (Comment) Care giver support system in place?: Yes (comment) Current home services: DME Criminal Activity/Legal Involvement Pertinent to Current Situation/Hospitalization: No - Comment as needed  Activities of Daily Living Home Assistive Devices/Equipment: None ADL Screening (condition at time of  admission) Patient's cognitive ability adequate to safely complete daily activities?: Yes Is the patient deaf or have difficulty hearing?: Yes Does the patient have difficulty seeing, even when wearing glasses/contacts?: No Does the patient have difficulty concentrating, remembering, or making decisions?: No Patient able to express need for assistance with ADLs?: Yes Does the patient have difficulty dressing or bathing?: No Independently performs ADLs?: Yes (appropriate for developmental age) Does the patient have difficulty walking or climbing stairs?: No Weakness of Legs: None Weakness of Arms/Hands: None  Permission Sought/Granted                  Emotional Assessment Appearance:: Appears stated age Attitude/Demeanor/Rapport: Engaged Affect (typically observed): Accepting Orientation: : Oriented to Self, Oriented to Place, Oriented to Situation, Oriented to  Time Alcohol / Substance Use: Illicit Drugs Psych Involvement: No (comment)  Admission diagnosis:  Dislocation of right ankle joint, initial encounter [S93.04XA] Fall in home, initial encounter [W19.XXXA, Y92.009] Type I or II open fracture of right ankle, initial encounter [S82.891B] Patient Active Problem List   Diagnosis Date Noted  . Fracture of tibial shaft, open 07/12/2019  . PAF (paroxysmal atrial fibrillation) ([place])   . Obesity   . Hypertension   . Hyperlipidemia   . GERD (gastroesophageal reflux disease)   . Hiatal hernia   . Arthritis   . Anxiety   . Chronic diastolic CHF (congestive heart failure) ([place])   .  Current use of long term anticoagulation 01/31/2018  . Chronic diastolic heart failure (HCC) 01/31/2018  . A-fib (HCC) 01/16/2018  . CHF (congestive heart failure) (HCC) 01/14/2018  . Hyperkalemia 01/14/2018  . Iron deficiency anemia 10/01/2017  . Osteoarthritis 09/24/2016  . Essential hypertension   . Hyponatremia 06/18/2016  . Hepatic cirrhosis (HCC) 06/18/2016  . Hypokalemia 06/18/2016  .  Metabolic syndrome 09/12/2015  . Obesity (BMI 30-39.9) 09/12/2015  . GAD (generalized anxiety disorder) 05/03/2015  . Varicose veins 06/24/2013  . Seasonal allergic rhinitis 04/30/2013  . Vitamin D deficiency 04/16/2013  . Hypothyroidism 04/16/2013  . Genu valgum (acquired) 04/16/2013   PCP:  Junie SpencerHawks, Christy A, FNP Pharmacy:   [facility] [address] - [place], [place] - [address] [place] [address] Phone: [phone] Fax: [phone]  [facility] [phone] - [place], [place] - [address] [place] [address] Phone: [phone] Fax: [phone]     Social Determinants of Health (SDOH) Interventions    Readmission Risk Interventions No flowsheet data found.

## 2019-07-13 NOTE — Progress Notes (Signed)
Subjective: 1 Day Post-Op Procedure(s) (LRB): IRRIGATION DEBRIDEMENTOPEN  AND REDUCTION INTERNAL FIXATION (ORIF) ANKLE FRACTURE (Right)  Patient reports pain as mild to moderate.  Denies fever, chills, N/V, CP, SOB.  Resting comfortably in bed.  Tolerating POs well.  Admits to flatus Objective:   VITALS:  Temp:  [97.7 F (36.5 C)-98.9 F (37.2 C)] 98.1 F (36.7 C) (08/02 2341) Pulse Rate:  [51-69] 58 (08/02 2341) Resp:  [10-26] 15 (08/02 2341) BP: (113-185)/(31-75) 152/45 (08/02 2341) SpO2:  [93 %-100 %] 98 % (08/03 0300) Weight:  [110.2 kg] 110.2 kg (08/02 1402)  General: WDWN patient in NAD. Psych:  Appropriate mood and affect. Neuro:  A&O x 3, Moving all extremities, sensation intact to light touch HEENT:  EOMs intact Chest:  Even non-labored respirations Skin:  SLS C/D/I, no rashes or lesions Extremities: warm/dry, mild edema, no erythema or echymosis.  No lymphadenopathy. Pulses: Popliteus 2+ MSK:  ROM: unable to move toes this morning, but admits to sensation, MMT: able to perform quad set    LABS Recent Labs    07/12/19 1026 07/13/19 0532  HGB 9.3* 7.7*  WBC 7.4 7.0  PLT 134* 130*   Recent Labs    07/12/19 1026 07/13/19 0532  NA 130* 129*  K 4.9 5.5*  CL 96* 96*  CO2 26 25  BUN 32* 29*  CREATININE 1.46* 1.42*  GLUCOSE 109* 145*   No results for input(s): LABPT, INR in the last 72 hours.   Assessment/Plan: 1 Day Post-Op Procedure(s) (LRB): IRRIGATION DEBRIDEMENTOPEN  AND REDUCTION INTERNAL FIXATION (ORIF) ANKLE FRACTURE (Right)  NWB R LE Up with therapy Resume blood thinners today. Plan for outpatient post-op visit with Dr. Annia Friendly [facility] Office:  [phone]

## 2019-07-13 NOTE — Evaluation (Signed)
Occupational Therapy Evaluation Patient Details Name: Katherine Tyler MRN: [id number] DOB: 04/07/1929 Today's Date: 07/13/2019    History of Present Illness Patient is an 85 year old female admitted after fall at home. Found to have distal tibia/fibula fx. S/P ORIF 07/12/19. PMH to include: HTN, Obesity, Afib, HLD, Gerd, Anxiety, CHF, OA.   Clinical Impression   Pt ambulated with a walker and was assisted with donning her socks, otherwise modified independent. She baked bread on the day of her fall. Pt puts forth her best effort to stand and to squat-pivot, but has difficulty maintaining NWB on her R LE. She will require lift equipment for subsequent transfers. Pt requires up to total assist for ADL. Her family is adamant they will not allow pt to go to a SNF. Recommending CIR.    Follow Up Recommendations  CIR    Equipment Recommendations  Wheelchair (measurements OT);Wheelchair cushion (measurements OT);Hospital bed(hoyer lift)    Recommendations for Other Services       Precautions / Restrictions Precautions Precautions: Fall Restrictions Weight Bearing Restrictions: Yes RLE Weight Bearing: Non weight bearing      Mobility Bed Mobility Overal bed mobility: Needs Assistance Bed Mobility: Supine to Sit;Sit to Supine     Supine to sit: Min assist Sit to supine: Min assist   General bed mobility comments: required min assist for moving right LE on/off bed, min assist to raise trunk to sitting position  Transfers Overall transfer level: Needs assistance Equipment used: Rolling walker (2 wheeled) Transfers: Sit to/from V[employer] CorporationStand;Squat Pivot Transfers Sit to Stand: +2 physical assistance;+2 safety/equipment;From elevated surface;Mod assist Stand pivot transfers: +2 physical assistance;+2 safety/equipment;From elevated surface;Max assist Squat pivot transfers: +2 physical assistance;+2 safety/equipment;Max assist     General transfer comment: patient was able to perform sit to stand  transfer with +2 mod assist, however was unable to pivot left foot once standing to transfer to the recliner. Performed squat pivot transfer +2 max assist. Required use of stedy to return to bed from recliner as she was fatigued at that point and unable to transfer back with +2 assist. Patient is unable to maintain NWB on right during transfers.    Balance Overall balance assessment: Needs assistance Sitting-balance support: Feet supported Sitting balance-Leahy Scale: Good     Standing balance support: Bilateral upper extremity supported Standing balance-Leahy Scale: Poor Standing balance comment: difficulty maintaining NWB on right LE in standing                           ADL either performed or assessed with clinical judgement   ADL Overall ADL's : Needs assistance/impaired Eating/Feeding: Independent   Grooming: Set up;Sitting   Upper Body Bathing: Set up;Sitting   Lower Body Bathing: Total assistance;Bed level   Upper Body Dressing : Minimal assistance;Sitting   Lower Body Dressing: Total assistance;Bed level   Toilet Transfer: +2 for physical assistance;Total assistance;Squat-pivot   Toileting- Clothing Manipulation and Hygiene: Total assistance;Bed level               Vision Baseline Vision/History: Wears glasses Wears Glasses: At all times Patient Visual Report: No change from baseline       Perception     Praxis      Pertinent Vitals/Pain Pain Assessment: No/denies pain     Hand Dominance Right   Extremity/Trunk Assessment Upper Extremity Assessment Upper Extremity Assessment: RUE deficits/detail;LUE deficits/detail RUE Deficits / Details: generalized weakness, limited shoulder ROM from OA RUE Coordination: decreased  gross motor LUE Deficits / Details: generalized weakness, limited shoulder ROM from OA LUE Coordination: decreased gross motor   Lower Extremity Assessment Lower Extremity Assessment: Defer to PT evaluation RLE Deficits /  Details: patient has 3/5 strength in right LE, otherwise WNL RLE Sensation: WNL   Cervical / Trunk Assessment Cervical / Trunk Assessment: Other exceptions Cervical / Trunk Exceptions: obesity   Communication Communication Communication: HOH   Cognition Arousal/Alertness: Awake/alert Behavior During Therapy: WFL for tasks assessed/performed Overall Cognitive Status: Within Functional Limits for tasks assessed                                     General Comments       Exercises     Shoulder Instructions      Home Living Family/patient expects to be discharged to:: Private residence Living Arrangements: Alone;Children Available Help at Discharge: Family;Available 24 hours/day Type of Home: House Home Access: Stairs to enter Entergy CorporationEntrance Stairs-Number of Steps: 1+1 Entrance Stairs-Rails: None Home Layout: One level     Bathroom Shower/Tub: Producer, television/film/videoWalk-in shower   Bathroom Toilet: Standard     Home Equipment: Environmental consultantWalker - 2 wheels;Shower seat - built in;Grab bars - tub/shower;Hand held shower head;Bedside commode          Prior Functioning/Environment Level of Independence: Needs assistance  Gait / Transfers Assistance Needed: ambulated with RW ADL's / Homemaking Assistance Needed: assist for socks, otherwise modified independent in ADL, meal prep and light housekeeping            OT Problem List: Decreased strength;Decreased activity tolerance;Impaired balance (sitting and/or standing);Decreased knowledge of use of DME or AE      OT Treatment/Interventions: Self-care/ADL training;DME and/or AE instruction;Patient/family education;Balance training;Therapeutic activities    OT Goals(Current goals can be found in the care plan section) Acute Rehab OT Goals Patient Stated Goal: to return home with family assisting OT Goal Formulation: With patient Time For Goal Achievement: 07/27/19 Potential to Achieve Goals: Good ADL Goals Pt Will Perform Lower Body  Bathing: with mod assist;with adaptive equipment;sitting/lateral leans Pt Will Perform Lower Body Dressing: with mod assist;with adaptive equipment;sitting/lateral leans Pt Will Transfer to Toilet: with transfer board;bedside commode;with min assist Pt Will Perform Toileting - Clothing Manipulation and hygiene: with mod assist;sitting/lateral leans Additional ADL Goal #1: Pt will maintain NWB on R LE during ADL and ADL transfers.  OT Frequency: Min 2X/week   Barriers to D/C:            Co-evaluation PT/OT/SLP Co-Evaluation/Treatment: Yes Reason for Co-Treatment: For patient/therapist safety PT goals addressed during session: Mobility/safety with mobility;Proper use of DME;Balance OT goals addressed during session: ADL's and self-care      AM-PAC OT "6 Clicks" Daily Activity     Outcome Measure Help from another person eating meals?: None Help from another person taking care of personal grooming?: A Little Help from another person toileting, which includes using toliet, bedpan, or urinal?: Total Help from another person bathing (including washing, rinsing, drying)?: A Lot Help from another person to put on and taking off regular upper body clothing?: A Little Help from another person to put on and taking off regular lower body clothing?: Total 6 Click Score: 14   End of Session Equipment Utilized During Treatment: Gait belt;Rolling walker;Other (comment)(stedy) Nurse Communication: Mobility status;Need for lift equipment  Activity Tolerance: Patient tolerated treatment well Patient left: in bed;with call bell/phone within reach;with bed alarm set  OT Visit Diagnosis: Unsteadiness on feet (R26.81);Pain;Muscle weakness (generalized) (M62.81);History of falling (Z91.81)                Time: 4734-0370 OT Time Calculation (min): 34 min Charges:  OT General Charges $OT Visit: 1 Visit OT Evaluation $OT Eval Moderate Complexity: 1 Mod  Nestor Lewandowsky, OTR/L Acute Rehabilitation  Services Pager: [phone] Office: [phone]  Malka So 07/13/2019, 1:33 PM

## 2019-07-13 NOTE — Progress Notes (Signed)
Rehab Admissions Coordinator Note:  Per PT recommendation, this patient was screened by Jhonnie Garner for appropriateness for an Inpatient Acute Rehab Consult.  At this time, we are recommending Inpatient Rehab consult. AC will contact MD to request order  Jhonnie Garner 07/13/2019, 12:47 PM  I can be reached at [phone].

## 2019-07-13 NOTE — Progress Notes (Signed)
PROGRESS NOTE    Katherine Tyler  ZOX:[id number] DOB: 05/09/1929 DOA: 07/12/2019 PCP: Junie SpencerHawks, Christy A, FNP    Brief Narrative:   Katherine Tyler is a 85 y.o. female with medical history significant of hypertension, hyperlipidemia, atrial fibrillation, grade 2 chronic diastolic congestive heart failure, obesity, gastroesophageal reflux disease, hypothyroidism, hyponatremia, iron deficiency anemia and hyperkalemia who presents to the ER today with a chief complaint of a fall.  She has had some right ankle trouble for quite a while.  She was walking in her home shortly prior to arrival when she put weight on her right ankle and it gave way.  She fell backwards onto her buttocks.  She has no pain or trauma from the fall itself.  She states that her ankle broke with the bone showing through the skin prior to the fall as she was putting weight on it.  She denies any head trauma.  She has no loss of consciousness.  No neck pain, no back pain, no chest pain, no shortness of breath, no other injuries other than her ankle.  On route EMS gave the patient 10 mg of morphine.  ED Course: Displaced fracture of the distal tibia and fibula dislocation reduced in the emergency department orthopedics consulted who feel patient needs to go to the OR today.  Case discussed with orthopedics who referred to medicine for admission.  Assessment & Plan:   Principal Problem:   Fracture of tibial shaft, open Active Problems:   PAF (paroxysmal atrial fibrillation) (HCC)   Obesity   Hypertension   Hyperlipidemia   GERD (gastroesophageal reflux disease)   Arthritis   Anxiety   Chronic diastolic CHF (congestive heart failure) (HCC)   Open right ankle fracture Patient presenting from home following mechanical fall.  On arrival, x-ray notable for open displaced fracture of the right distal tibia/fibula with dislocation.  Dislocation was reduced in the ED and patient underwent ORIF of her right ankle on 07/12/2019 by Dr. Victorino DikeHewitt.  --Continue nonweightbearing status per orthopedics of right lower extremity --Restarted Eliquis for DVT prophylaxis --PT OT currently recommend SNF/CIR; patient refuses SNF, will consult CIR for evaluation --Follow-up visit with Ortho outpatient as scheduled  Paroxysmal atrial fibrillation Rate controlled. --Continue diltiazem --Continue Eliquis 5 mg p.o. twice daily for anticoagulation  Chronic diastolic congestive heart failure Compensated --Continue losartan, diltiazem, metoprolol succinate, furosemide 20 mg every other day  Essential hypertension --Continue losartan 100 mg p.o. daily, diltiazem 120 mg p.o. daily, metoprolol succinate 25 mg p.o. daily  Hypothyroidism: Continue levothyroxine 125 mcg p.o. daily  Anxiety/depression: Continue Celexa 20 mg p.o. daily  Obesity BMI 43.  Discussed with patient need for aggressive weight loss measures as this complicates all facets of care  Iron deficiency anemia: Continue ferrous sulfate 305 mg p.o. twice daily   DVT prophylaxis: Eliquis Code Status: Full code Family Communication: None Disposition Plan: Patient refused SNF placement, anticipate discharge home versus CIR, pending CIR evaluation    Consultants:   Orthopedics - Dr. Victorino DikeHewitt  Procedures:   ORIF right ankle 8/2 by Dr. Victorino DikeHewitt  Antimicrobials:   Perioperative cefazolin   Subjective: Patient seen and examined at bedside, resting comfortably.  No complaints this morning.  Discussion regarding SNF placement, patient declines.  Awaiting PT OT evaluation.  Denies headache, no fever/chills/night sweats, no nausea vomiting/diarrhea, no chest pain, no palpitations, no shortness of breath, no abdominal pain, no weakness, no paresthesias.  No acute events overnight per nursing staff.  Objective: Vitals:   07/13/19 0417  07/13/19 0926 07/13/19 0945 07/13/19 1152  BP: (!) 145/45 (!) 148/42 (!) 128/34 117/90  Pulse: (!) 51 (!) 56 (!) 55 62  Resp: 14  16 16   Temp: 98.2 F  (36.8 C)  98.6 F (37 C) 98.6 F (37 C)  TempSrc: Oral  Oral Oral  SpO2: 99% 98% 98% 92%  Weight:      Height:        Intake/Output Summary (Last 24 hours) at 07/13/2019 1532 Last data filed at 07/13/2019 1500 Gross per 24 hour  Intake 1680 ml  Output 1720 ml  Net -40 ml   Filed Weights   07/12/19 1402  Weight: 110.2 kg    Examination:  General exam: Appears calm and comfortable  Respiratory system: Clear to auscultation. Respiratory effort normal. Cardiovascular system: S1 & S2 heard, RRR. No JVD, murmurs, rubs, gallops or clicks. No pedal edema. Gastrointestinal system: Abdomen is nondistended, soft and nontender. No organomegaly or masses felt. Normal bowel sounds heard. Central nervous system: Alert and oriented. No focal neurological deficits. Extremities: Symmetric 5 x 5 power.  Neurovascular intact Skin: No rashes, lesions or ulcers Psychiatry: Judgement and insight appear normal. Mood & affect appropriate.     Data Reviewed: I have personally reviewed following labs and imaging studies  CBC: Recent Labs  Lab 07/12/19 1026 07/13/19 0532  WBC 7.4 7.0  HGB 9.3* 7.7*  HCT 29.8* 24.1*  MCV 103.5* 101.7*  PLT 134* 130*   Basic Metabolic Panel: Recent Labs  Lab 07/12/19 1026 07/13/19 0532  NA 130* 129*  K 4.9 5.5*  CL 96* 96*  CO2 26 25  GLUCOSE 109* 145*  BUN 32* 29*  CREATININE 1.46* 1.42*  CALCIUM 9.2 8.6*   GFR: Estimated Creatinine Clearance: 32.6 mL/min (A) (by C-G formula based on SCr of 1.42 mg/dL (H)). Liver Function Tests: No results for input(s): AST, ALT, ALKPHOS, BILITOT, PROT, ALBUMIN in the last 168 hours. No results for input(s): LIPASE, AMYLASE in the last 168 hours. No results for input(s): AMMONIA in the last 168 hours. Coagulation Profile: No results for input(s): INR, PROTIME in the last 168 hours. Cardiac Enzymes: No results for input(s): CKTOTAL, CKMB, CKMBINDEX, TROPONINI in the last 168 hours. BNP (last 3 results) No  results for input(s): PROBNP in the last 8760 hours. HbA1C: No results for input(s): HGBA1C in the last 72 hours. CBG: No results for input(s): GLUCAP in the last 168 hours. Lipid Profile: No results for input(s): CHOL, HDL, LDLCALC, TRIG, CHOLHDL, LDLDIRECT in the last 72 hours. Thyroid Function Tests: No results for input(s): TSH, T4TOTAL, FREET4, T3FREE, THYROIDAB in the last 72 hours. Anemia Panel: No results for input(s): VITAMINB12, FOLATE, FERRITIN, TIBC, IRON, RETICCTPCT in the last 72 hours. Sepsis Labs: No results for input(s): PROCALCITON, LATICACIDVEN in the last 168 hours.  Recent Results (from the past 240 hour(s))  SARS Coronavirus 2 [facility](Hospital order, Performed in [facility] hospital lab) Nasopharyngeal Nasopharyngeal Swab     Status: None   Collection Time: 07/12/19 10:13 AM   Specimen: Nasopharyngeal Swab  Result Value Ref Range Status   SARS Coronavirus 2 NEGATIVE NEGATIVE Final    Comment: (NOTE) If result is NEGATIVE SARS-CoV-2 target nucleic acids are NOT DETECTED. The SARS-CoV-2 RNA is generally detectable in upper and lower  respiratory specimens during the acute phase of infection. The lowest  concentration of SARS-CoV-2 viral copies this assay can detect is 250  copies / mL. A negative result does not preclude SARS-CoV-2 infection  and should  not be used as the sole basis for treatment or other  patient management decisions.  A negative result may occur with  improper specimen collection / handling, submission of specimen other  than nasopharyngeal swab, presence of viral mutation(s) within the  areas targeted by this assay, and inadequate number of viral copies  (<250 copies / mL). A negative result must be combined with clinical  observations, patient history, and epidemiological information. If result is POSITIVE SARS-CoV-2 target nucleic acids are DETECTED. The SARS-CoV-2 RNA is generally detectable in upper and lower  respiratory specimens dur ing  the acute phase of infection.  Positive  results are indicative of active infection with SARS-CoV-2.  Clinical  correlation with patient history and other diagnostic information is  necessary to determine patient infection status.  Positive results do  not rule out bacterial infection or co-infection with other viruses. If result is PRESUMPTIVE POSTIVE SARS-CoV-2 nucleic acids MAY BE PRESENT.   A presumptive positive result was obtained on the submitted specimen  and confirmed on repeat testing.  While 2019 novel coronavirus  (SARS-CoV-2) nucleic acids may be present in the submitted sample  additional confirmatory testing may be necessary for epidemiological  and / or clinical management purposes  to differentiate between  SARS-CoV-2 and other Sarbecovirus currently known to infect humans.  If clinically indicated additional testing with an alternate test  methodology [phone] is advised. The SARS-CoV-2 RNA is generally  detectable in upper and lower respiratory sp ecimens during the acute  phase of infection. The expected result is Negative. Fact Sheet for Patients:  BoilerBrush.com.cyhttps://www.fda.gov/media/136312/download Fact Sheet for Healthcare Providers: https://pope.com/https://www.fda.gov/media/136313/download This test is not yet approved or cleared by the [place] FDA and has been authorized for detection and/or diagnosis of SARS-CoV-2 by FDA under an Emergency Use Authorization (EUA).  This EUA will remain in effect (meaning this test can be used) for the duration of the COVID-19 declaration under Section 564(b)(1) of the Act, 21 U.S.C. section 360bbb-3(b)(1), unless the authorization is terminated or revoked sooner. Performed at [facility], [address] [place], [place] [address]          Radiology Studies: Dg Tibia/fibula Right  Addendum Date: 07/12/2019   ADDENDUM REPORT: 07/12/2019 11:31 ADDENDUM: Study discussed by telephone with Dr. Kennis CarinaMICHAEL BERO on 07/12/2019 at 1121 hours.  Electronically Signed   By: Odessa FlemingH  Hall M.D.   On: 07/12/2019 11:31   Result Date: 07/12/2019 CLINICAL DATA:  85 year old female status post fall. EXAM: RIGHT TIBIA AND FIBULA - 2 VIEW COMPARISON:  None. FINDINGS: Four portable views of the right tib fib including the ankle. Joint space loss and degenerative spurring are evident at the right knee. The proximal right tibia, fibula, and midshaft right tibia and fibula appear intact. There is a lateral fracture dislocation of the right ankle with evidence of exposed bone at the distal tibia (image 2). The talar dome is impacted on the lateral aspect of the distal right fibula. The lateral malleolus fracture fragment remains located lateral to the talus. And medial malleolus fracture fragment is also displaced. IMPRESSION: 1. Compound fracture dislocation at the ankle, complete lateral dislocation and displacement of the mortise joint. 2. The more proximal right tibia and fibula appear intact. Electronically Signed: By: Odessa FlemingH  Hall M.D. On: 07/12/2019 11:16   Dg Ankle 2 Views Right  Result Date: 07/12/2019 CLINICAL DATA:  Fracture and dislocation of the right ankle status post reduction and cast. EXAM: RIGHT ANKLE - 2 VIEW COMPARISON:  None.  FINDINGS: Fractures of distal fibula and tibia with dislocation of the right ankle are less displaced compared to pre reduction films. IMPRESSION: Fractures of distal fibula and tibia with dislocation of the right ankle are less displaced compared to pre reduction films. Negative. Electronically Signed   By: Abelardo Diesel M.D.   On: 07/12/2019 11:47   Dg Chest Port 1 View  Result Date: 07/12/2019 CLINICAL DATA:  85 year old female status post fall with fracture dislocation at the ankle. EXAM: PORTABLE CHEST 1 VIEW COMPARISON:  01/15/2018 chest radiographs and earlier. FINDINGS: Portable AP semi upright view at 1040 hours. Mildly lower lung volumes. Cardiac size appears more normal today. Calcified aortic atherosclerosis. Other  mediastinal contours are within normal limits. Visualized tracheal air column is within normal limits. Allowing for portable technique the lungs are clear. Negative visible bowel gas pattern. Stable cholecystectomy clips. IMPRESSION: Low lung volumes with no acute cardiopulmonary abnormality. Aortic Atherosclerosis (ICD10-I70.0). Electronically Signed   By: Genevie Ann M.D.   On: 07/12/2019 11:18   Dg Foot 2 Views Right  Result Date: 07/12/2019 CLINICAL DATA:  85 year old female status post fall with fracture dislocation at the ankle. EXAM: RIGHT FOOT - 2 VIEW COMPARISON:  Right tib-fib series. FINDINGS: 2 portable views of the right foot. The right ankle deformity is not very apparent on these images. Calcified peripheral vascular disease.  Osteopenia. No superimposed acute tarsal, metatarsal or phalanx fracture identified in the right foot. IMPRESSION: Right ankle fracture dislocation reported separately. No superimposed acute fracture identified in the right foot. Electronically Signed   By: Genevie Ann M.D.   On: 07/12/2019 11:18        Scheduled Meds: . apixaban  5 mg Oral BID  . cholecalciferol  1,000 Units Oral Daily  . citalopram  20 mg Oral Daily  . diltiazem  120 mg Oral BID  . docusate sodium  100 mg Oral BID  . ferrous sulfate  325 mg Oral BID  . furosemide  20 mg Oral QODAY  . levothyroxine  125 mcg Oral QAC breakfast  . losartan  100 mg Oral Daily  . magnesium oxide  400 mg Oral Daily  . metoprolol succinate  25 mg Oral Daily  . senna  1 tablet Oral BID  . thiamine  100 mg Oral Daily   Continuous Infusions: .  ceFAZolin (ANCEF) IV 1 g (07/13/19 0458)  . lactated ringers 50 mL/hr at 07/13/19 0501     LOS: 1 day    Time spent: 38 minutes spent on chart review, discussion with nursing staff, consultants, updating family and interview/physical exam; more than 50% of that time was spent in counseling and/or coordination of care.    Francyne Arreaga J [place] DO [facility] Pager  [phone]  If 7PM-7AM, please contact night-coverage [email or web address] Password [facility] 07/13/2019, 3:32 PM

## 2019-07-14 ENCOUNTER — Encounter (HOSPITAL_COMMUNITY): Payer: Self-pay | Admitting: Orthopedic Surgery

## 2019-07-14 ENCOUNTER — Telehealth: Payer: Self-pay | Admitting: Family

## 2019-07-14 DIAGNOSIS — D649 Anemia, unspecified: Secondary | ICD-10-CM

## 2019-07-14 LAB — CBC
HCT: 20.9 % — ABNORMAL LOW (ref 36.0–46.0)
Hemoglobin: 6.7 g/dL — CL (ref 12.0–15.0)
MCH: 32.4 pg (ref 26.0–34.0)
MCHC: 32.1 g/dL (ref 30.0–36.0)
MCV: 101 fL — ABNORMAL HIGH (ref 80.0–100.0)
Platelets: 114 10*3/uL — ABNORMAL LOW (ref 150–400)
RBC: 2.07 MIL/uL — ABNORMAL LOW (ref 3.87–5.11)
RDW: 15.1 % (ref 11.5–15.5)
WBC: 7.7 10*3/uL (ref 4.0–10.5)
nRBC: 0 % (ref 0.0–0.2)

## 2019-07-14 LAB — VITAMIN D 25 HYDROXY (VIT D DEFICIENCY, FRACTURES): Vit D, 25-Hydroxy: 25.2 ng/mL — ABNORMAL LOW (ref 30.0–100.0)

## 2019-07-14 LAB — BASIC METABOLIC PANEL
Anion gap: 8 (ref 5–15)
BUN: 31 mg/dL — ABNORMAL HIGH (ref 8–23)
CO2: 25 mmol/L (ref 22–32)
Calcium: 8.6 mg/dL — ABNORMAL LOW (ref 8.9–10.3)
Chloride: 97 mmol/L — ABNORMAL LOW (ref 98–111)
Creatinine, Ser: 1.24 mg/dL — ABNORMAL HIGH (ref 0.44–1.00)
GFR calc Af Amer: 45 mL/min — ABNORMAL LOW (ref >60)
GFR calc non Af Amer: 39 mL/min — ABNORMAL LOW (ref >60)
Glucose, Bld: 101 mg/dL — ABNORMAL HIGH (ref 70–99)
Potassium: 4.6 mmol/L (ref 3.5–5.1)
Sodium: 130 mmol/L — ABNORMAL LOW (ref 135–145)

## 2019-07-14 LAB — HEMOGLOBIN AND HEMATOCRIT, BLOOD
HCT: 26.2 % — ABNORMAL LOW (ref 36.0–46.0)
Hemoglobin: 8.4 g/dL — ABNORMAL LOW (ref 12.0–15.0)

## 2019-07-14 LAB — PREPARE RBC (CROSSMATCH)

## 2019-07-14 LAB — MAGNESIUM: Magnesium: 2.3 mg/dL (ref 1.7–2.4)

## 2019-07-14 LAB — ABO/RH: ABO/RH(D): A POS

## 2019-07-14 MED ORDER — SODIUM CHLORIDE 0.9% IV SOLUTION
Freq: Once | INTRAVENOUS | Status: AC
  Administered 2019-07-14: 11:00:00 via INTRAVENOUS

## 2019-07-14 MED ORDER — TRAMADOL HCL 50 MG PO TABS: 50.0000 mg | ORAL_TABLET | Freq: Four times a day (QID) | ORAL | 0 refills | Status: DC | PRN

## 2019-07-14 NOTE — Progress Notes (Signed)
CRITICAL VALUE ALERT  Critical Value:  Hemoglobin 6.7  Date & Time Notied:  8/4   Provider Notified: [facility]  Orders Received/Actions taken: Type & Screen ordered by [employer]

## 2019-07-14 NOTE — Progress Notes (Addendum)
Subjective: 2 Days Post-Op Procedure(s) (LRB): OPEN  AND REDUCTION INTERNAL FIXATION (ORIF) ANKLE FRACTURE (Right) Irrigation And Debridement Extremity (Right)  Patient reports pain as mild to moderate.  Tolerating POs well.  Resting comfortably in bed.  Reports that she received tranfusion.  Denies fever, chills, N/V, CP, SOB.  Objective:   VITALS:  Temp:  [98.2 F (36.8 C)-99.3 F (37.4 C)] 99.2 F (37.3 C) (08/04 1258) Pulse Rate:  [55-69] 69 (08/04 1258) Resp:  [16-18] 16 (08/04 1258) BP: (133-191)/(25-69) 191/58 (08/04 1301) SpO2:  [92 %-99 %] 98 % (08/04 1258)  General: WDWN patient in NAD. Psych:  Appropriate mood and affect. Neuro:  A&O x 3, Moving all extremities, sensation intact to light touch HEENT:  EOMs intact Chest:  Even non-labored respirations Skin: SLS C/D/I, no rashes or lesions Extremities: warm/dry, mild edema, no erythema or echymosis to R LE.  No lymphadenopathy. Pulses: Popliteus 2+ MSK:  ROM: EHL/FHL intact, MMT: able to perform quad set    LABS Recent Labs    07/12/19 1026 07/13/19 0532 07/14/19 0457  HGB 9.3* 7.7* 6.7*  WBC 7.4 7.0 7.7  PLT 134* 130* 114*   Recent Labs    07/13/19 0532 07/14/19 0457  NA 129* 130*  K 5.5* 4.6  CL 96* 97*  CO2 25 25  BUN 29* 31*  CREATININE 1.42* 1.24*  GLUCOSE 145* 101*   No results for input(s): LABPT, INR in the last 72 hours.   Assessment/Plan: 2 Days Post-Op Procedure(s) (LRB): OPEN  AND REDUCTION INTERNAL FIXATION (ORIF) ANKLE FRACTURE (Right) Irrigation And Debridement Extremity (Right)  NWB R LE  Up with therapy. Patient is on Eliquis at home. Rx for tramadol on chart. Disp: PT recommends CIR Plan for outpatient post-op visit with Dr. Doran Durand.   Mechele Claude PA-C [facility] Office:  [phone]

## 2019-07-14 NOTE — Progress Notes (Addendum)
PT Cancellation Note  Patient Details Name: Katherine Tyler MRN: [id number] DOB: July 17, 1929   Cancelled Treatment:    Reason Eval/Treat Not Completed: Medical issues which prohibited therapy, noted critically low hemoglobin value, supposed to get blood. Will follow-up for PT treatment as appropriate and schedule permits.  Mabeline Caras, PT, DPT Acute Rehabilitation Services  Pager [phone] Office [place] 07/14/2019, 7:48 AM

## 2019-07-14 NOTE — Plan of Care (Signed)
?  Problem: Education: ?Goal: Knowledge of the prescribed therapeutic regimen will improve ?Outcome: Progressing ?  ?Problem: Activity: ?Goal: Ability to increase mobility will improve ?Outcome: Progressing ?  ?Problem: Pain Management: ?Goal: Pain level will decrease with appropriate interventions ?Outcome: Progressing ?  ?Problem: Skin Integrity: ?Goal: Will show signs of wound healing ?Outcome: Progressing ?  ?

## 2019-07-14 NOTE — Telephone Encounter (Signed)
FYI

## 2019-07-14 NOTE — Progress Notes (Addendum)
PROGRESS NOTE    Katherine Tyler  XBJ:[id number] DOB: 06/14/1929 DOA: 07/12/2019 PCP: Junie SpencerHawks, Christy A, FNP    Brief Narrative:   Katherine Tyler is a 85 y.o. female with medical history significant of hypertension, hyperlipidemia, atrial fibrillation, grade 2 chronic diastolic congestive heart failure, obesity, gastroesophageal reflux disease, hypothyroidism, hyponatremia, iron deficiency anemia and hyperkalemia who presents to the ER today with a chief complaint of a fall.  She has had some right ankle trouble for quite a while.  She was walking in her home shortly prior to arrival when she put weight on her right ankle and it gave way.  She fell backwards onto her buttocks.  She has no pain or trauma from the fall itself.  She states that her ankle broke with the bone showing through the skin prior to the fall as she was putting weight on it.  She denies any head trauma.  She has no loss of consciousness.  No neck pain, no back pain, no chest pain, no shortness of breath, no other injuries other than her ankle.  On route EMS gave the patient 10 mg of morphine.   ED Course: Displaced fracture of the distal tibia and fibula dislocation reduced in the emergency department orthopedics consulted who feel patient needs to go to the OR today.  Case discussed with orthopedics who referred to medicine for admission.  Assessment & Plan:   Principal Problem:   Fracture of tibial shaft, open Active Problems:   PAF (paroxysmal atrial fibrillation) (HCC)   Obesity   Hypertension   Hyperlipidemia   GERD (gastroesophageal reflux disease)   Arthritis   Anxiety   Chronic diastolic CHF (congestive heart failure) (HCC)   Open right ankle fracture Patient presenting from home following mechanical fall.  On arrival, x-ray notable for open displaced fracture of the right distal tibia/fibula with dislocation.  Dislocation was reduced in the ED and patient underwent ORIF of her right ankle on 07/12/2019 by Dr. Victorino DikeHewitt.  --Continue nonweightbearing status per orthopedics of right lower extremity --Restarted Eliquis for DVT prophylaxis --PT OT currently recommend SNF/CIR; patient refuses SNF, will consult CIR for evaluation --Follow-up visit with Ortho outpatient as scheduled  Acute postoperative blood loss anemia Hemoglobin down to 6.7 today.  No signs of active bleeding. --Transfuse 1 unit PRBC on 07/14/2019 --Repeat CBC in a.m.  Paroxysmal atrial fibrillation Rate controlled. --Continue diltiazem --Continue Eliquis 5 mg p.o. twice daily for anticoagulation  Chronic diastolic congestive heart failure Compensated --Continue losartan, diltiazem, metoprolol succinate, furosemide 20 mg every other day  Essential hypertension --Continue losartan 100 mg p.o. daily, diltiazem 120 mg p.o. daily, metoprolol succinate 25 mg p.o. daily  CKD Stage 3 Patient presenting with creatinine of 1.46.  Review of EMR notable for creatinine range of 1.241.65 over the past year.  Currently at baseline. --Creatinine 1.24 today --Avoid nephrotoxins, renally dose all medications --Follow renal function daily  Hypothyroidism: Continue levothyroxine 125 mcg p.o. daily  Anxiety/depression: Continue Celexa 20 mg p.o. daily  Obesity BMI 43.  Discussed with patient need for aggressive weight loss measures as this complicates all facets of care  Iron deficiency anemia: Continue ferrous sulfate 305 mg p.o. twice daily   DVT prophylaxis: Eliquis Code Status: Full code Family Communication: None Disposition Plan: Patient refused SNF placement, anticipate discharge home versus CIR, pending CIR evaluation    Consultants:  Orthopedics - Dr. Victorino DikeHewitt  Procedures:  ORIF right ankle 8/2 by Dr. Victorino DikeHewitt  Antimicrobials:  Perioperative cefazolin  Subjective: Patient seen and examined at bedside, resting comfortably.  No complaints this morning.  Hemoglobin dropped to 6.7, will transfuse.  Denies headache, no  fever/chills/night sweats, no nausea vomiting/diarrhea, no chest pain, no palpitations, no shortness of breath, no abdominal pain, no weakness, no paresthesias.  No acute events overnight per nursing staff.  Objective: Vitals:   07/14/19 0949 07/14/19 1047 07/14/19 1258 07/14/19 1301  BP: (!) 133/43 (!) 156/69 (!) 191/58 (!) 191/58  Pulse: (!) 59 60 69   Resp: 16 16 16    Temp: 98.3 F (36.8 C) 99.2 F (37.3 C) 99.2 F (37.3 C)   TempSrc: Oral Oral Oral   SpO2: 97% 99% 98%   Weight:      Height:        Intake/Output Summary (Last 24 hours) at 07/14/2019 1456 Last data filed at 07/14/2019 1301 Gross per 24 hour  Intake 955 ml  Output 1000 ml  Net -45 ml   Filed Weights   07/12/19 1402  Weight: 110.2 kg    Examination:  General exam: Appears calm and comfortable  Respiratory system: Clear to auscultation. Respiratory effort normal. Cardiovascular system: S1 & S2 heard, RRR. No JVD, murmurs, rubs, gallops or clicks. No pedal edema. Gastrointestinal system: Abdomen is nondistended, soft and nontender. No organomegaly or masses felt. Normal bowel sounds heard. Central nervous system: Alert and oriented. No focal neurological deficits. Extremities: Symmetric 5 x 5 power.  Neurovascular intact Skin: No rashes, lesions or ulcers Psychiatry: Judgement and insight appear normal. Mood & affect appropriate.     Data Reviewed: I have personally reviewed following labs and imaging studies  CBC: Recent Labs  Lab 07/12/19 1026 07/13/19 0532 07/14/19 0457  WBC 7.4 7.0 7.7  HGB 9.3* 7.7* 6.7*  HCT 29.8* 24.1* 20.9*  MCV 103.5* 101.7* 101.0*  PLT 134* 130* 114*   Basic Metabolic Panel: Recent Labs  Lab 07/12/19 1026 07/13/19 0532 07/14/19 0457  NA 130* 129* 130*  K 4.9 5.5* 4.6  CL 96* 96* 97*  CO2 26 25 25   GLUCOSE 109* 145* 101*  BUN 32* 29* 31*  CREATININE 1.46* 1.42* 1.24*  CALCIUM 9.2 8.6* 8.6*  MG  --   --  2.3   GFR: Estimated Creatinine Clearance: 37.4 mL/min  (A) (by C-G formula based on SCr of 1.24 mg/dL (H)). Liver Function Tests: No results for input(s): AST, ALT, ALKPHOS, BILITOT, PROT, ALBUMIN in the last 168 hours. No results for input(s): LIPASE, AMYLASE in the last 168 hours. No results for input(s): AMMONIA in the last 168 hours. Coagulation Profile: No results for input(s): INR, PROTIME in the last 168 hours. Cardiac Enzymes: No results for input(s): CKTOTAL, CKMB, CKMBINDEX, TROPONINI in the last 168 hours. BNP (last 3 results) No results for input(s): PROBNP in the last 8760 hours. HbA1C: No results for input(s): HGBA1C in the last 72 hours. CBG: No results for input(s): GLUCAP in the last 168 hours. Lipid Profile: No results for input(s): CHOL, HDL, LDLCALC, TRIG, CHOLHDL, LDLDIRECT in the last 72 hours. Thyroid Function Tests: No results for input(s): TSH, T4TOTAL, FREET4, T3FREE, THYROIDAB in the last 72 hours. Anemia Panel: No results for input(s): VITAMINB12, FOLATE, FERRITIN, TIBC, IRON, RETICCTPCT in the last 72 hours. Sepsis Labs: No results for input(s): PROCALCITON, LATICACIDVEN in the last 168 hours.  Recent Results (from the past 240 hour(s))  SARS Coronavirus 2 [facility](Hospital order, [facility]) Nasopharyngeal Nasopharyngeal Swab     Status: None   Collection Time:  07/12/19 10:13 AM   Specimen: Nasopharyngeal Swab  Result Value Ref Range Status   SARS Coronavirus 2 NEGATIVE NEGATIVE Final    Comment: (NOTE) If result is NEGATIVE SARS-CoV-2 target nucleic acids are NOT DETECTED. The SARS-CoV-2 RNA is generally detectable in upper and lower  respiratory specimens during the acute phase of infection. The lowest  concentration of SARS-CoV-2 viral copies this assay can detect is 250  copies / mL. A negative result does not preclude SARS-CoV-2 infection  and should not be used as the sole basis for treatment or other  patient management decisions.  A negative result may occur with  improper  specimen collection / handling, submission of specimen other  than nasopharyngeal swab, presence of viral mutation(s) within the  areas targeted by this assay, and inadequate number of viral copies  (<250 copies / mL). A negative result must be combined with clinical  observations, patient history, and epidemiological information. If result is POSITIVE SARS-CoV-2 target nucleic acids are DETECTED. The SARS-CoV-2 RNA is generally detectable in upper and lower  respiratory specimens dur ing the acute phase of infection.  Positive  results are indicative of active infection with SARS-CoV-2.  Clinical  correlation with patient history and other diagnostic information is  necessary to determine patient infection status.  Positive results do  not rule out bacterial infection or co-infection with other viruses. If result is PRESUMPTIVE POSTIVE SARS-CoV-2 nucleic acids MAY BE PRESENT.   A presumptive positive result was obtained on the submitted specimen  and confirmed on repeat testing.  While 2019 novel coronavirus  (SARS-CoV-2) nucleic acids may be present in the submitted sample  additional confirmatory testing may be necessary for epidemiological  and / or clinical management purposes  to differentiate between  SARS-CoV-2 and other Sarbecovirus currently known to infect humans.  If clinically indicated additional testing with an alternate test  methodology [phone]) is advised. The SARS-CoV-2 RNA is generally  detectable in upper and lower respiratory sp ecimens during the acute  phase of infection. The expected result is Negative. Fact Sheet for Patients:  StrictlyIdeas.no Fact Sheet for Healthcare Providers: BankingDealers.co.za This test is not yet approved or cleared by the [place] FDA and has been authorized for detection and/or diagnosis of SARS-CoV-2 by FDA under an Emergency Use Authorization (EUA).  This EUA will remain in  effect (meaning this test can be used) for the duration of the COVID-19 declaration under Section 564(b)(1) of the Act, 21 U.S.C. section 360bbb-3(b)(1), unless the authorization is terminated or revoked sooner. Performed at [facility], [place] [address], [place], [place] [address]          Radiology Studies: No results found.      Scheduled Meds:  apixaban  5 mg Oral BID   cholecalciferol  1,000 Units Oral Daily   citalopram  20 mg Oral Daily   diltiazem  120 mg Oral BID   docusate sodium  100 mg Oral BID   ferrous sulfate  325 mg Oral BID   furosemide  20 mg Oral QODAY   levothyroxine  125 mcg Oral QAC breakfast   losartan  100 mg Oral Daily   magnesium oxide  400 mg Oral Daily   metoprolol succinate  25 mg Oral Daily   senna  1 tablet Oral BID   thiamine  100 mg Oral Daily   Continuous Infusions:  lactated ringers 50 mL/hr at 07/13/19 0501     LOS: 2 days    Time spent:  35 minutes spent on chart review, discussion with nursing staff, consultants, updating family and interview/physical exam; more than 50% of that time was spent in counseling and/or coordination of care.    Alvira PhilipsEric J [place], DO [facility] Pager [phone]  If 7PM-7AM, please contact night-coverage [email or web address] Password [email or web address] 07/14/2019, 2:56 PM

## 2019-07-15 ENCOUNTER — Inpatient Hospital Stay (HOSPITAL_COMMUNITY)
Admission: RE | Admit: 2019-07-15 | Discharge: 2019-08-05 | DRG: 560 | Disposition: A | Discharge status: Home Health | Payer: Medicare Other | Source: Intra-hospital | Attending: Physical Medicine and Rehabilitation | Admitting: Physical Medicine and Rehabilitation

## 2019-07-15 ENCOUNTER — Other Ambulatory Visit: Payer: Self-pay

## 2019-07-15 ENCOUNTER — Encounter (HOSPITAL_COMMUNITY): Payer: Self-pay

## 2019-07-15 DIAGNOSIS — D509 Iron deficiency anemia, unspecified: Secondary | ICD-10-CM | POA: Diagnosis present

## 2019-07-15 DIAGNOSIS — Z4682 Encounter for fitting and adjustment of non-vascular catheter: Secondary | ICD-10-CM | POA: Diagnosis not present

## 2019-07-15 DIAGNOSIS — E871 Hypo-osmolality and hyponatremia: Secondary | ICD-10-CM | POA: Diagnosis present

## 2019-07-15 DIAGNOSIS — R112 Nausea with vomiting, unspecified: Secondary | ICD-10-CM | POA: Diagnosis not present

## 2019-07-15 DIAGNOSIS — I130 Hypertensive heart and chronic kidney disease with heart failure and stage 1 through stage 4 chronic kidney disease, or unspecified chronic kidney disease: Secondary | ICD-10-CM | POA: Diagnosis present

## 2019-07-15 DIAGNOSIS — R197 Diarrhea, unspecified: Secondary | ICD-10-CM

## 2019-07-15 DIAGNOSIS — W19XXXD Unspecified fall, subsequent encounter: Secondary | ICD-10-CM | POA: Diagnosis not present

## 2019-07-15 DIAGNOSIS — Z978 Presence of other specified devices: Secondary | ICD-10-CM

## 2019-07-15 DIAGNOSIS — M2550 Pain in unspecified joint: Secondary | ICD-10-CM | POA: Diagnosis not present

## 2019-07-15 DIAGNOSIS — S82201S Unspecified fracture of shaft of right tibia, sequela: Secondary | ICD-10-CM

## 2019-07-15 DIAGNOSIS — Z6841 Body Mass Index (BMI) 40.0 and over, adult: Secondary | ICD-10-CM

## 2019-07-15 DIAGNOSIS — R109 Unspecified abdominal pain: Secondary | ICD-10-CM | POA: Diagnosis not present

## 2019-07-15 DIAGNOSIS — I959 Hypotension, unspecified: Secondary | ICD-10-CM | POA: Diagnosis not present

## 2019-07-15 DIAGNOSIS — M25562 Pain in left knee: Secondary | ICD-10-CM | POA: Diagnosis present

## 2019-07-15 DIAGNOSIS — S82401S Unspecified fracture of shaft of right fibula, sequela: Secondary | ICD-10-CM

## 2019-07-15 DIAGNOSIS — K56600 Partial intestinal obstruction, unspecified as to cause: Secondary | ICD-10-CM | POA: Diagnosis not present

## 2019-07-15 DIAGNOSIS — I5032 Chronic diastolic (congestive) heart failure: Secondary | ICD-10-CM | POA: Diagnosis not present

## 2019-07-15 DIAGNOSIS — Z7401 Bed confinement status: Secondary | ICD-10-CM | POA: Diagnosis not present

## 2019-07-15 DIAGNOSIS — E559 Vitamin D deficiency, unspecified: Secondary | ICD-10-CM | POA: Diagnosis present

## 2019-07-15 DIAGNOSIS — N183 Chronic kidney disease, stage 3 (moderate): Secondary | ICD-10-CM | POA: Diagnosis present

## 2019-07-15 DIAGNOSIS — F329 Major depressive disorder, single episode, unspecified: Secondary | ICD-10-CM | POA: Diagnosis present

## 2019-07-15 DIAGNOSIS — Z8249 Family history of ischemic heart disease and other diseases of the circulatory system: Secondary | ICD-10-CM | POA: Diagnosis not present

## 2019-07-15 DIAGNOSIS — E876 Hypokalemia: Secondary | ICD-10-CM | POA: Diagnosis not present

## 2019-07-15 DIAGNOSIS — D62 Acute posthemorrhagic anemia: Secondary | ICD-10-CM | POA: Diagnosis present

## 2019-07-15 DIAGNOSIS — R1110 Vomiting, unspecified: Secondary | ICD-10-CM

## 2019-07-15 DIAGNOSIS — Z823 Family history of stroke: Secondary | ICD-10-CM

## 2019-07-15 DIAGNOSIS — M25561 Pain in right knee: Secondary | ICD-10-CM | POA: Diagnosis present

## 2019-07-15 DIAGNOSIS — Z4659 Encounter for fitting and adjustment of other gastrointestinal appliance and device: Secondary | ICD-10-CM

## 2019-07-15 DIAGNOSIS — Z833 Family history of diabetes mellitus: Secondary | ICD-10-CM

## 2019-07-15 DIAGNOSIS — I10 Essential (primary) hypertension: Secondary | ICD-10-CM | POA: Diagnosis not present

## 2019-07-15 DIAGNOSIS — I480 Paroxysmal atrial fibrillation: Secondary | ICD-10-CM | POA: Diagnosis present

## 2019-07-15 DIAGNOSIS — S82851S Displaced trimalleolar fracture of right lower leg, sequela: Secondary | ICD-10-CM | POA: Diagnosis not present

## 2019-07-15 DIAGNOSIS — F411 Generalized anxiety disorder: Secondary | ICD-10-CM | POA: Diagnosis present

## 2019-07-15 DIAGNOSIS — K567 Ileus, unspecified: Secondary | ICD-10-CM | POA: Diagnosis not present

## 2019-07-15 DIAGNOSIS — Z7901 Long term (current) use of anticoagulants: Secondary | ICD-10-CM

## 2019-07-15 DIAGNOSIS — S82851E Displaced trimalleolar fracture of right lower leg, subsequent encounter for open fracture type I or II with routine healing: Secondary | ICD-10-CM | POA: Diagnosis not present

## 2019-07-15 DIAGNOSIS — N179 Acute kidney failure, unspecified: Secondary | ICD-10-CM

## 2019-07-15 DIAGNOSIS — K5900 Constipation, unspecified: Secondary | ICD-10-CM | POA: Diagnosis present

## 2019-07-15 DIAGNOSIS — E039 Hypothyroidism, unspecified: Secondary | ICD-10-CM | POA: Diagnosis present

## 2019-07-15 DIAGNOSIS — R58 Hemorrhage, not elsewhere classified: Secondary | ICD-10-CM | POA: Diagnosis not present

## 2019-07-15 DIAGNOSIS — Z452 Encounter for adjustment and management of vascular access device: Secondary | ICD-10-CM | POA: Diagnosis not present

## 2019-07-15 DIAGNOSIS — R0989 Other specified symptoms and signs involving the circulatory and respiratory systems: Secondary | ICD-10-CM | POA: Diagnosis not present

## 2019-07-15 LAB — CBC
HCT: 25.7 % — ABNORMAL LOW (ref 36.0–46.0)
Hemoglobin: 8.2 g/dL — ABNORMAL LOW (ref 12.0–15.0)
MCH: 31.9 pg (ref 26.0–34.0)
MCHC: 31.9 g/dL (ref 30.0–36.0)
MCV: 100 fL (ref 80.0–100.0)
Platelets: 128 10*3/uL — ABNORMAL LOW (ref 150–400)
RBC: 2.57 MIL/uL — ABNORMAL LOW (ref 3.87–5.11)
RDW: 16.3 % — ABNORMAL HIGH (ref 11.5–15.5)
WBC: 7.5 10*3/uL (ref 4.0–10.5)
nRBC: 0 % (ref 0.0–0.2)

## 2019-07-15 LAB — BPAM RBC
Blood Product Expiration Date: 202008242359
ISSUE DATE / TIME: 202008041006
Unit Type and Rh: 6200

## 2019-07-15 LAB — BASIC METABOLIC PANEL
Anion gap: 9 (ref 5–15)
BUN: 27 mg/dL — ABNORMAL HIGH (ref 8–23)
CO2: 26 mmol/L (ref 22–32)
Calcium: 8.8 mg/dL — ABNORMAL LOW (ref 8.9–10.3)
Chloride: 99 mmol/L (ref 98–111)
Creatinine, Ser: 1.21 mg/dL — ABNORMAL HIGH (ref 0.44–1.00)
GFR calc Af Amer: 46 mL/min — ABNORMAL LOW (ref >60)
GFR calc non Af Amer: 40 mL/min — ABNORMAL LOW (ref >60)
Glucose, Bld: 101 mg/dL — ABNORMAL HIGH (ref 70–99)
Potassium: 4.6 mmol/L (ref 3.5–5.1)
Sodium: 134 mmol/L — ABNORMAL LOW (ref 135–145)

## 2019-07-15 LAB — TYPE AND SCREEN
ABO/RH(D): A POS
Antibody Screen: NEGATIVE
Unit division: 0

## 2019-07-15 MED ORDER — OXYCODONE HCL 5 MG PO TABS
5.0000 mg | ORAL_TABLET | Freq: Four times a day (QID) | ORAL | Status: DC | PRN
  Administered 2019-07-16 – 2019-08-03 (×16): 5 mg via ORAL
  Filled 2019-07-15 (×19): qty 1

## 2019-07-15 MED ORDER — ACETAMINOPHEN 325 MG PO TABS
325.0000 mg | ORAL_TABLET | ORAL | Status: DC | PRN
  Administered 2019-07-15 – 2019-07-19 (×4): 650 mg via ORAL
  Filled 2019-07-15 (×3): qty 2

## 2019-07-15 MED ORDER — FLEET ENEMA 7-19 GM/118ML RE ENEM: 1.0000 | ENEMA | Freq: Once | RECTAL | Status: DC | PRN

## 2019-07-15 MED ORDER — VITAMIN D 25 MCG (1000 UNIT) PO TABS
1000.0000 [IU] | ORAL_TABLET | Freq: Every day | ORAL | Status: DC
  Administered 2019-07-16 – 2019-08-05 (×19): 1000 [IU] via ORAL
  Filled 2019-07-15 (×20): qty 1

## 2019-07-15 MED ORDER — DILTIAZEM HCL ER COATED BEADS 120 MG PO CP24
120.0000 mg | ORAL_CAPSULE | Freq: Two times a day (BID) | ORAL | Status: DC
  Administered 2019-07-15 – 2019-07-21 (×11): 120 mg via ORAL
  Filled 2019-07-15 (×14): qty 1

## 2019-07-15 MED ORDER — PROCHLORPERAZINE 25 MG RE SUPP: 12.5000 mg | Freq: Four times a day (QID) | RECTAL | Status: DC | PRN

## 2019-07-15 MED ORDER — BISACODYL 10 MG RE SUPP
10.0000 mg | Freq: Every day | RECTAL | Status: DC | PRN
  Administered 2019-07-16 – 2019-07-25 (×2): 10 mg via RECTAL
  Filled 2019-07-15 (×2): qty 1

## 2019-07-15 MED ORDER — SENNOSIDES-DOCUSATE SODIUM 8.6-50 MG PO TABS
2.0000 | ORAL_TABLET | Freq: Every day | ORAL | Status: DC
  Administered 2019-07-15 – 2019-07-26 (×11): 2 via ORAL
  Filled 2019-07-15 (×11): qty 2

## 2019-07-15 MED ORDER — FERROUS SULFATE 325 (65 FE) MG PO TABS
325.0000 mg | ORAL_TABLET | Freq: Two times a day (BID) | ORAL | Status: DC
  Administered 2019-07-16 – 2019-07-19 (×8): 325 mg via ORAL
  Filled 2019-07-15 (×9): qty 1

## 2019-07-15 MED ORDER — TRAZODONE HCL 50 MG PO TABS
25.0000 mg | ORAL_TABLET | Freq: Every evening | ORAL | Status: DC | PRN
  Administered 2019-07-22 – 2019-07-23 (×2): 25 mg via ORAL
  Administered 2019-07-24 – 2019-07-25 (×2): 50 mg via ORAL
  Administered 2019-07-26: 23:00:00 25 mg via ORAL
  Administered 2019-07-28 – 2019-08-04 (×4): 50 mg via ORAL
  Filled 2019-07-15 (×11): qty 1

## 2019-07-15 MED ORDER — DOCUSATE SODIUM 100 MG PO CAPS
100.0000 mg | ORAL_CAPSULE | Freq: Two times a day (BID) | ORAL | Status: DC
  Administered 2019-07-15 – 2019-07-20 (×10): 100 mg via ORAL
  Filled 2019-07-15 (×12): qty 1

## 2019-07-15 MED ORDER — MAGNESIUM OXIDE 400 (241.3 MG) MG PO TABS
400.0000 mg | ORAL_TABLET | Freq: Every day | ORAL | Status: DC
  Administered 2019-07-16 – 2019-08-05 (×20): 400 mg via ORAL
  Filled 2019-07-15 (×21): qty 1

## 2019-07-15 MED ORDER — METOPROLOL SUCCINATE ER 25 MG PO TB24
25.0000 mg | ORAL_TABLET | Freq: Every day | ORAL | Status: DC
  Administered 2019-07-16 – 2019-07-21 (×6): 25 mg via ORAL
  Filled 2019-07-15 (×7): qty 1

## 2019-07-15 MED ORDER — PROCHLORPERAZINE MALEATE 5 MG PO TABS
5.0000 mg | ORAL_TABLET | Freq: Four times a day (QID) | ORAL | Status: DC | PRN
  Administered 2019-07-16 – 2019-08-04 (×6): 10 mg via ORAL
  Filled 2019-07-15 (×6): qty 2

## 2019-07-15 MED ORDER — ENOXAPARIN SODIUM 40 MG/0.4ML ~~LOC~~ SOLN: 40.0000 mg | SUBCUTANEOUS | Status: DC

## 2019-07-15 MED ORDER — VITAMIN B-1 100 MG PO TABS
100.0000 mg | ORAL_TABLET | Freq: Every day | ORAL | Status: DC
  Administered 2019-07-16 – 2019-08-05 (×20): 100 mg via ORAL
  Filled 2019-07-15 (×21): qty 1

## 2019-07-15 MED ORDER — APIXABAN 5 MG PO TABS
5.0000 mg | ORAL_TABLET | Freq: Two times a day (BID) | ORAL | Status: DC
  Administered 2019-07-15 – 2019-08-05 (×39): 5 mg via ORAL
  Filled 2019-07-15 (×41): qty 1

## 2019-07-15 MED ORDER — LOSARTAN POTASSIUM 50 MG PO TABS
100.0000 mg | ORAL_TABLET | Freq: Every day | ORAL | Status: DC
  Administered 2019-07-16 – 2019-08-05 (×20): 100 mg via ORAL
  Filled 2019-07-15 (×21): qty 2

## 2019-07-15 MED ORDER — CITALOPRAM HYDROBROMIDE 20 MG PO TABS
20.0000 mg | ORAL_TABLET | Freq: Every day | ORAL | Status: DC
  Administered 2019-07-16 – 2019-08-05 (×20): 20 mg via ORAL
  Filled 2019-07-15 (×21): qty 1

## 2019-07-15 MED ORDER — LEVOTHYROXINE SODIUM 25 MCG PO TABS
125.0000 ug | ORAL_TABLET | Freq: Every day | ORAL | Status: DC
  Administered 2019-07-17 – 2019-08-05 (×16): 125 ug via ORAL
  Filled 2019-07-15 (×22): qty 1

## 2019-07-15 MED ORDER — POLYETHYLENE GLYCOL 3350 17 G PO PACK: 17.0000 g | PACK | Freq: Every day | ORAL | Status: DC | PRN

## 2019-07-15 MED ORDER — DIPHENHYDRAMINE HCL 12.5 MG/5ML PO ELIX: 12.5000 mg | ORAL_SOLUTION | Freq: Four times a day (QID) | ORAL | Status: DC | PRN

## 2019-07-15 MED ORDER — SENNA 8.6 MG PO TABS: 1.0000 | ORAL_TABLET | Freq: Two times a day (BID) | ORAL | Status: DC

## 2019-07-15 MED ORDER — FUROSEMIDE 20 MG PO TABS
20.0000 mg | ORAL_TABLET | ORAL | Status: DC
  Administered 2019-07-17 – 2019-08-04 (×9): 20 mg via ORAL
  Filled 2019-07-15 (×9): qty 1

## 2019-07-15 MED ORDER — POLYETHYLENE GLYCOL 3350 17 G PO PACK
17.0000 g | PACK | Freq: Once | ORAL | Status: AC
  Administered 2019-07-15: 20:00:00 17 g via ORAL
  Filled 2019-07-15: qty 1

## 2019-07-15 MED ORDER — ALUM & MAG HYDROXIDE-SIMETH 200-200-20 MG/5ML PO SUSP
30.0000 mL | ORAL | Status: DC | PRN
  Administered 2019-07-25: 30 mL via ORAL
  Filled 2019-07-15: qty 30

## 2019-07-15 MED ORDER — PROCHLORPERAZINE EDISYLATE 10 MG/2ML IJ SOLN: 5.0000 mg | Freq: Four times a day (QID) | INTRAMUSCULAR | Status: DC | PRN

## 2019-07-15 MED ORDER — POLYETHYLENE GLYCOL 3350 17 G PO PACK
17.0000 g | PACK | Freq: Every day | ORAL | Status: DC | PRN
  Administered 2019-07-19: 06:00:00 17 g via ORAL
  Filled 2019-07-15: qty 1

## 2019-07-15 MED ORDER — GUAIFENESIN-DM 100-10 MG/5ML PO SYRP: 5.0000 mL | ORAL_SOLUTION | Freq: Four times a day (QID) | ORAL | Status: DC | PRN

## 2019-07-15 NOTE — Discharge Summary (Signed)
Physician Discharge Summary  [name] LEX:[id number] DOB: June 04, 1929 DOA: 07/12/2019  PCP: Sharion Balloon, FNP  Admit date: 07/12/2019 Discharge date: 07/15/2019  Admitted From: Home Disposition:  CIR  Recommendations for Outpatient Follow-up:  1. Follow up with rehab physician 2. Please obtain BMP/CBC in one week 3. Please follow up on the following pending results:  Home Health: N/A Equipment/Devices: None  Discharge Condition: Stable CODE STATUS: Full code Diet recommendation: Heart Healthy   History of present illness:  Katherine Tyler a 85 y.o.femalewith medical history significant ofhypertension, hyperlipidemia, atrial fibrillation, grade 2 chronic diastolic congestive heart failure, obesity, gastroesophageal reflux disease, hypothyroidism, hyponatremia, iron deficiency anemia and hyperkalemia who presents to the ER today with a chief complaint of a fall. She has had some right ankle trouble for quite a while. She was walking in her home shortly prior to arrival when she put weight on her right ankle and it gave way. She fell backwards onto her buttocks. She has no pain or trauma from the fall itself. She states that her ankle broke with the bone showing through the skin prior to the fall as she was putting weight on it. She denies any head trauma. She has no loss of consciousness. No neck pain, no back pain, no chest pain, no shortness of breath, no other injuries other than her ankle. On route EMS gave the patient 10 mg of morphine.  ED Course:Displaced fracture of the distal tibia and fibula dislocation reduced in the emergency department orthopedics consulted who feel patient needs to go to the OR today. Case discussed with orthopedics who referred to medicine for admission.  Hospital course:  Open right ankle fracture Patient presenting from home following mechanical fall.  On arrival, x-ray notable for open displaced fracture of the right distal tibia/fibula with  dislocation.  Dislocation was reduced in the ED and patient underwent ORIF of her right ankle on 07/12/2019 by Dr. Doran Durand.  Patient was restarted on Eliquis for DVT prophylaxis.  Patient is to continue nonweightbearing status per orthopedics of right lower extremity.  Discharged to CIR for further rehabilitation. Follow-up visit with Ortho outpatient as scheduled.  Acute postoperative blood loss anemia Hemoglobin down to 6.7 on 07/14/2019.  No signs of active bleeding.  Transfuse 1 unit PRBC on 07/14/2019 with appropriate rise of hemoglobin to 8.2 at time of discharge.  Recommend repeat CBC in 1 week.  Paroxysmal atrial fibrillation Rate controlled. Continue diltiazem and Eliquis 5 mg p.o. twice daily for anticoagulation  Chronic diastolic congestive heart failure Compensated. Continue losartan, diltiazem, metoprolol succinate, furosemide 20 mg every other day  Essential hypertension Continue losartan 100 mg p.o. daily, diltiazem 120 mg p.o. daily, metoprolol succinate 25 mg p.o. daily  CKD Stage 3 Patient presenting with creatinine of 1.46.  Review of EMR notable for creatinine range of 1.24-1.65 over the past year.  Currently at baseline.  Creatinine 1.21 at time of discharge.  Hypothyroidism: Continue levothyroxine 125 mcg p.o. daily  Anxiety/depression: Continue Celexa 20 mg p.o. daily  Obesity BMI 43.  Discussed with patient need for aggressive weight loss measures as this complicates all facets of care  Iron deficiency anemia: Continue ferrous sulfate 305 mg p.o. twice daily  Discharge Diagnoses:  Principal Problem:   Fracture of tibial shaft, open Active Problems:   PAF (paroxysmal atrial fibrillation) (HCC)   Obesity   Hypertension   Hyperlipidemia   GERD (gastroesophageal reflux disease)   Arthritis   Anxiety   Chronic diastolic CHF (congestive  heart failure) [facility](HCC)    Discharge Instructions  Discharge Instructions    Diet - low sodium heart healthy   Complete  by: As directed    Increase activity slowly   Complete by: As directed      Allergies as of 07/15/2019      Reactions   Terazosin Hcl Other (See Comments)   Muscle aches   Keflex [cephalexin] Other (See Comments)   unknown      Medication List    TAKE these medications   apixaban 5 MG Tabs tablet Commonly known as: ELIQUIS Take 1 tablet (5 mg total) by mouth 2 (two) times daily.   citalopram 20 MG tablet Commonly known as: CELEXA Take 1 tablet by mouth once daily   D3-1000 25 MCG (1000 UT) capsule Generic drug: Cholecalciferol Take 1,000 Units by mouth daily.   diltiazem 120 MG 24 hr capsule Commonly known as: CARDIZEM CD Take 1 capsule (120 mg total) by mouth 2 (two) times daily.   Ferrous Sulfate Dried 45 MG Tbcr Commonly known as: Slow Release Iron Take 45 mg by mouth 2 (two) times daily.   furosemide 20 MG tablet Commonly known as: LASIX Take 1 tablet (20 mg total) by mouth every other day.   levothyroxine 125 MCG tablet Commonly known as: SYNTHROID Take 125 mcg by mouth daily before breakfast.   losartan 100 MG tablet Commonly known as: COZAAR Take 1 tablet (100 mg total) by mouth daily.   magnesium oxide 400 (241.3 Mg) MG tablet Commonly known as: MAG-OX Take 1 tablet (400 mg total) by mouth daily.   metoprolol succinate 25 MG 24 hr tablet Commonly known as: Toprol XL Take 1 tablet (25 mg total) by mouth daily.   nystatin powder Commonly known as: MYCOSTATIN/NYSTOP Apply topically 4 (four) times daily.   thiamine 100 MG tablet Commonly known as: VITAMIN B-1 Take 100 mg by mouth daily.   traMADol 50 MG tablet Commonly known as: Ultram Take 1 tablet (50 mg total) by mouth every 6 (six) hours as needed for up to 5 days for moderate pain.      Follow-up Information    Toni ArthursHewitt, John, MD. Schedule an appointment as soon as possible for a visit in 2 weeks.   Specialty: Orthopedic Surgery Contact information: [address]  [address] [place] [address] [phone]          Allergies  Allergen Reactions  . Terazosin Hcl Other (See Comments)    Muscle aches  . Keflex [Cephalexin] Other (See Comments)    unknown    Consultations:  Orthopedics - Dr. Victorino DikeHewitt   Procedures/Studies: Dg Tibia/fibula Right  Addendum Date: 07/12/2019   ADDENDUM REPORT: 07/12/2019 11:31 ADDENDUM: Study discussed by telephone with Dr. Kennis CarinaMICHAEL BERO on 07/12/2019 at 1121 hours. Electronically Signed   By: Odessa FlemingH  Hall M.D.   On: 07/12/2019 11:31   Result Date: 07/12/2019 CLINICAL DATA:  85 year old female status post fall. EXAM: RIGHT TIBIA AND FIBULA - 2 VIEW COMPARISON:  None. FINDINGS: Four portable views of the right tib fib including the ankle. Joint space loss and degenerative spurring are evident at the right knee. The proximal right tibia, fibula, and midshaft right tibia and fibula appear intact. There is a lateral fracture dislocation of the right ankle with evidence of exposed bone at the distal tibia (image 2). The talar dome is impacted on the lateral aspect of the distal right fibula. The lateral malleolus fracture fragment remains located lateral to the talus. And medial  malleolus fracture fragment is also displaced. IMPRESSION: 1. Compound fracture dislocation at the ankle, complete lateral dislocation and displacement of the mortise joint. 2. The more proximal right tibia and fibula appear intact. Electronically Signed: By: Odessa Fleming M.D. On: 07/12/2019 11:16   Dg Ankle 2 Views Right  Result Date: 07/12/2019 CLINICAL DATA:  Fracture and dislocation of the right ankle status post reduction and cast. EXAM: RIGHT ANKLE - 2 VIEW COMPARISON:  None. FINDINGS: Fractures of distal fibula and tibia with dislocation of the right ankle are less displaced compared to pre reduction films. IMPRESSION: Fractures of distal fibula and tibia with dislocation of the right ankle are less displaced compared to pre reduction films. Negative. Electronically  Signed   By: Sherian Rein M.D.   On: 07/12/2019 11:47   Dg Chest Port 1 View  Result Date: 07/12/2019 CLINICAL DATA:  85 year old female status post fall with fracture dislocation at the ankle. EXAM: PORTABLE CHEST 1 VIEW COMPARISON:  01/15/2018 chest radiographs and earlier. FINDINGS: Portable AP semi upright view at 1040 hours. Mildly lower lung volumes. Cardiac size appears more normal today. Calcified aortic atherosclerosis. Other mediastinal contours are within normal limits. Visualized tracheal air column is within normal limits. Allowing for portable technique the lungs are clear. Negative visible bowel gas pattern. Stable cholecystectomy clips. IMPRESSION: Low lung volumes with no acute cardiopulmonary abnormality. Aortic Atherosclerosis (ICD10-I70.0). Electronically Signed   By: Odessa Fleming M.D.   On: 07/12/2019 11:18   Dg Foot 2 Views Right  Result Date: 07/12/2019 CLINICAL DATA:  85 year old female status post fall with fracture dislocation at the ankle. EXAM: RIGHT FOOT - 2 VIEW COMPARISON:  Right tib-fib series. FINDINGS: 2 portable views of the right foot. The right ankle deformity is not very apparent on these images. Calcified peripheral vascular disease.  Osteopenia. No superimposed acute tarsal, metatarsal or phalanx fracture identified in the right foot. IMPRESSION: Right ankle fracture dislocation reported separately. No superimposed acute fracture identified in the right foot. Electronically Signed   By: Odessa Fleming M.D.   On: 07/12/2019 11:18       Subjective: Patient seen and examined at bedside, resting comfortably.  Right ankle dressing in place.  Pain controlled.  No complaints.  Ready for discharge to CIR today.  Denies headache, no fever/chills/night sweats, no nausea vomiting/diarrhea, no dizziness, no chest pain, no complications, no shortness of breath, no abdominal pain.  No acute events overnight per nurse staff.   Discharge Exam: Vitals:   07/15/19 0923 07/15/19 0925  BP:  (!) 148/92 (!) 148/92  Pulse: 68   Resp:    Temp:    SpO2:     Vitals:   07/14/19 2008 07/15/19 0412 07/15/19 0923 07/15/19 0925  BP: (!) 148/51 (!) 148/92 (!) 148/92 (!) 148/92  Pulse: 61 68 68   Resp: 16 15    Temp: 98.4 F (36.9 C) 98.4 F (36.9 C)    TempSrc: Oral Oral    SpO2: 98% 94%    Weight:      Height:        General: Pt is alert, awake, not in acute distress Cardiovascular: RRR, S1/S2 +, no rubs, no gallops Respiratory: CTA bilaterally, no wheezing, no rhonchi Abdominal: Soft, NT, ND, bowel sounds + Extremities: no edema, no cyanosis, neurovascular intact    The results of significant diagnostics from this hospitalization (including imaging, microbiology, ancillary and laboratory) are listed below for reference.     Microbiology: Recent Results (from the past 240  hour(s))  SARS Coronavirus 2 [facility] order, Performed in [facility] hospital lab) Nasopharyngeal Nasopharyngeal Swab     Status: None   Collection Time: 07/12/19 10:13 AM   Specimen: Nasopharyngeal Swab  Result Value Ref Range Status   SARS Coronavirus 2 NEGATIVE NEGATIVE Final    Comment: (NOTE) If result is NEGATIVE SARS-CoV-2 target nucleic acids are NOT DETECTED. The SARS-CoV-2 RNA is generally detectable in upper and lower  respiratory specimens during the acute phase of infection. The lowest  concentration of SARS-CoV-2 viral copies this assay can detect is 250  copies / mL. A negative result does not preclude SARS-CoV-2 infection  and should not be used as the sole basis for treatment or other  patient management decisions.  A negative result may occur with  improper specimen collection / handling, submission of specimen other  than nasopharyngeal swab, presence of viral mutation(s) within the  areas targeted by this assay, and inadequate number of viral copies  (<250 copies / mL). A negative result must be combined with clinical  observations, patient history, and epidemiological  information. If result is POSITIVE SARS-CoV-2 target nucleic acids are DETECTED. The SARS-CoV-2 RNA is generally detectable in upper and lower  respiratory specimens dur ing the acute phase of infection.  Positive  results are indicative of active infection with SARS-CoV-2.  Clinical  correlation with patient history and other diagnostic information is  necessary to determine patient infection status.  Positive results do  not rule out bacterial infection or co-infection with other viruses. If result is PRESUMPTIVE POSTIVE SARS-CoV-2 nucleic acids MAY BE PRESENT.   A presumptive positive result was obtained on the submitted specimen  and confirmed on repeat testing.  While 2019 novel coronavirus  (SARS-CoV-2) nucleic acids may be present in the submitted sample  additional confirmatory testing may be necessary for epidemiological  and / or clinical management purposes  to differentiate between  SARS-CoV-2 and other Sarbecovirus currently known to infect humans.  If clinically indicated additional testing with an alternate test  methodology [phone]) is advised. The SARS-CoV-2 RNA is generally  detectable in upper and lower respiratory sp ecimens during the acute  phase of infection. The expected result is Negative. Fact Sheet for Patients:  BoilerBrush.com.cy Fact Sheet for Healthcare Providers: https://pope.com/ This test is not yet approved or cleared by the [place] FDA and has been authorized for detection and/or diagnosis of SARS-CoV-2 by FDA under an Emergency Use Authorization (EUA).  This EUA will remain in effect (meaning this test can be used) for the duration of the COVID-19 declaration under Section 564(b)(1) of the Act, 21 U.S.C. section 360bbb-3(b)(1), unless the authorization is terminated or revoked sooner. Performed at [facility], [address] [place], [place] [address]      Labs: BNP (last 3  results) No results for input(s): BNP in the last 8760 hours. Basic Metabolic Panel: Recent Labs  Lab 07/12/19 1026 07/13/19 0532 07/14/19 0457 07/15/19 0239  NA 130* 129* 130* 134*  K 4.9 5.5* 4.6 4.6  CL 96* 96* 97* 99  CO2 26 25 25 26   GLUCOSE 109* 145* 101* 101*  BUN 32* 29* 31* 27*  CREATININE 1.46* 1.42* 1.24* 1.21*  CALCIUM 9.2 8.6* 8.6* 8.8*  MG  --   --  2.3  --    Liver Function Tests: No results for input(s): AST, ALT, ALKPHOS, BILITOT, PROT, ALBUMIN in the last 168 hours. No results for input(s): LIPASE, AMYLASE in the last 168 hours. No  results for input(s): AMMONIA in the last 168 hours. CBC: Recent Labs  Lab 07/12/19 1026 07/13/19 0532 07/14/19 0457 07/14/19 1559 07/15/19 0239  WBC 7.4 7.0 7.7  --  7.5  HGB 9.3* 7.7* 6.7* 8.4* 8.2*  HCT 29.8* 24.1* 20.9* 26.2* 25.7*  MCV 103.5* 101.7* 101.0*  --  100.0  PLT 134* 130* 114*  --  128*   Cardiac Enzymes: No results for input(s): CKTOTAL, CKMB, CKMBINDEX, TROPONINI in the last 168 hours. BNP: Invalid input(s): POCBNP CBG: No results for input(s): GLUCAP in the last 168 hours. D-Dimer No results for input(s): DDIMER in the last 72 hours. Hgb A1c No results for input(s): HGBA1C in the last 72 hours. Lipid Profile No results for input(s): CHOL, HDL, LDLCALC, TRIG, CHOLHDL, LDLDIRECT in the last 72 hours. Thyroid function studies No results for input(s): TSH, T4TOTAL, T3FREE, THYROIDAB in the last 72 hours.  Invalid input(s): FREET3 Anemia work up No results for input(s): VITAMINB12, FOLATE, FERRITIN, TIBC, IRON, RETICCTPCT in the last 72 hours. Urinalysis    Component Value Date/Time   COLORURINE YELLOW 01/14/2018 2040   APPEARANCEUR HAZY (A) 01/14/2018 2040   LABSPEC 1.006 01/14/2018 2040   PHURINE 5.0 01/14/2018 2040   GLUCOSEU NEGATIVE 01/14/2018 2040   HGBUR NEGATIVE 01/14/2018 2040   BILIRUBINUR NEGATIVE 01/14/2018 2040   KETONESUR NEGATIVE 01/14/2018 2040   PROTEINUR 100 (A) 01/14/2018  2040   UROBILINOGEN 1.0 03/08/2008 1015   NITRITE NEGATIVE 01/14/2018 2040   LEUKOCYTESUR TRACE (A) 01/14/2018 2040   Sepsis Labs Invalid input(s): PROCALCITONIN,  WBC,  LACTICIDVEN Microbiology Recent Results (from the past 240 hour(s))  SARS Coronavirus 2 [facility](Hospital order, Performed in [facility] lab) Nasopharyngeal Nasopharyngeal Swab     Status: None   Collection Time: 07/12/19 10:13 AM   Specimen: Nasopharyngeal Swab  Result Value Ref Range Status   SARS Coronavirus 2 NEGATIVE NEGATIVE Final    Comment: (NOTE) If result is NEGATIVE SARS-CoV-2 target nucleic acids are NOT DETECTED. The SARS-CoV-2 RNA is generally detectable in upper and lower  respiratory specimens during the acute phase of infection. The lowest  concentration of SARS-CoV-2 viral copies this assay can detect is 250  copies / mL. A negative result does not preclude SARS-CoV-2 infection  and should not be used as the sole basis for treatment or other  patient management decisions.  A negative result may occur with  improper specimen collection / handling, submission of specimen other  than nasopharyngeal swab, presence of viral mutation(s) within the  areas targeted by this assay, and inadequate number of viral copies  (<250 copies / mL). A negative result must be combined with clinical  observations, patient history, and epidemiological information. If result is POSITIVE SARS-CoV-2 target nucleic acids are DETECTED. The SARS-CoV-2 RNA is generally detectable in upper and lower  respiratory specimens dur ing the acute phase of infection.  Positive  results are indicative of active infection with SARS-CoV-2.  Clinical  correlation with patient history and other diagnostic information is  necessary to determine patient infection status.  Positive results do  not rule out bacterial infection or co-infection with other viruses. If result is PRESUMPTIVE POSTIVE SARS-CoV-2 nucleic acids MAY BE PRESENT.   A  presumptive positive result was obtained on the submitted specimen  and confirmed on repeat testing.  While 2019 novel coronavirus  (SARS-CoV-2) nucleic acids may be present in the submitted sample  additional confirmatory testing may be necessary for epidemiological  and / or clinical management purposes  to differentiate between  SARS-CoV-2 and other Sarbecovirus currently known to infect humans.  If clinically indicated additional testing with an alternate test  methodology [phone]) is advised. The SARS-CoV-2 RNA is generally  detectable in upper and lower respiratory sp ecimens during the acute  phase of infection. The expected result is Negative. Fact Sheet for Patients:  BoilerBrush.com.cyhttps://www.fda.gov/media/136312/download Fact Sheet for Healthcare Providers: https://pope.com/https://www.fda.gov/media/136313/download This test is not yet approved or cleared by the [place] FDA and has been authorized for detection and/or diagnosis of SARS-CoV-2 by FDA under an Emergency Use Authorization (EUA).  This EUA will remain in effect (meaning this test can be used) for the duration of the COVID-19 declaration under Section 564(b)(1) of the Act, 21 U.S.C. section 360bbb-3(b)(1), unless the authorization is terminated or revoked sooner. Performed at [facility], [address] [place], [place] [address]      Time coordinating discharge: Over 30 minutes  SIGNED:   Alvira PhilipsEric J [place], DO  [facility] 07/15/2019, 12:39 PM

## 2019-07-15 NOTE — PMR Pre-admission (Signed)
PMR Admission Coordinator Pre-Admission Assessment  Patient: Katherine Tyler is an 85 y.o., female MRN: [id number] DOB: [birth year] Height: 5' 3"  (160 cm) Weight: 110.2 kg  Insurance Information HMO:     PPO:      PCP:      IPA:      80/20:      OTHER:  PRIMARY: Medicare A and B      Policy#: [id number]      Subscriber: patient CM Name:       Phone#:      Fax#:  Pre-Cert#: verified on passport one      Employer:  Benefits:  Phone #:      Name:  Eff. Date: 05/10/1996 A and B     Deduct: $1408      Out of Pocket Max: n/a      Life Max: n/a CIR: 100%      SNF: 20 full days Outpatient: 80%     Co-Pay: 20% Home Health: 100%      Co-Pay:  DME: 80%     Co-Pay: 20% Providers: pt choice SECONDARY: Medicaid of [place]      Policy#: [id number]      Subscriber: patient CM Name:       Phone#:      Fax#:  Pre-Cert#: verified eligibility online, coverage code [place]      Employer:  Benefits:  Phone #:      Name:  Eff. Date:      Deduct:       Out of Pocket Max:       Life Max:  CIR:       SNF:  Outpatient:      Co-Pay:  Home Health:       Co-Pay:  DME:      Co-Pay:   Medicaid Application Date:       Case Manager:  Disability Application Date:       Case Worker:   The "Data Collection Information Summary" for patients in Inpatient Rehabilitation Facilities with attached "Privacy Act [place] Records" was provided and verbally reviewed with: Patient and Family  Emergency Contact Information Contact Information    Name Relation Home Work [place] Daughter [phone]        Current Medical History  Patient Admitting Diagnosis: R tib/fib fx  History of Present Illness: Pt is an 85 y/o female with PMH of HTN, a fib, diastolic CHF, obesity, anemia, and hyperkalemia who presented to [facility] ED following a fall at home.  She states she was walking in her home and her ankle gave way and she fell backwards.  She states her ankle broke and her bone was through the skin prior to  the fall, just as she was putting weight on it.  Orthopedics was consulted and recommended emergent ORIF with Dr. Doran Durand. Hospital course acute on chronic anemia, and pain control.  Therapy evaluations completed and pt recommended for CIR.      Patient's medical [facility] has been reviewed by the rehabilitation admission coordinator and physician.  Past Medical History  Past Medical History:  Diagnosis Date  . Anxiety   . Arthritis   . Chronic diastolic CHF (congestive heart failure) ([place])   . GERD (gastroesophageal reflux disease)   . Hiatal hernia   . Hyperkalemia   . Hyperlipidemia   . Hypertension   . Hypothyroidism   . Iron deficiency anemia   . Obesity   . PAF (paroxysmal atrial  fibrillation) ([place])    a. diagnosed in 01/2018. Started on Eliquis for anticoagulation.   . Vitamin D deficiency     Family History   family history includes Diabetes in her daughter; Heart disease in her brother, brother, brother, mother, sister, sister, and sister; Stroke in her father.  Prior Rehab/Hospitalizations Has the patient had prior rehab or hospitalizations prior to admission? No  Has the patient had major surgery during 100 days prior to admission? Yes   Current Medications  Current Facility-Administered Medications:  .  acetaminophen (TYLENOL) tablet 650 mg, 650 mg, Oral, Q6H PRN, 650 mg at 07/15/19 0603 **OR** acetaminophen (TYLENOL) suppository 650 mg, 650 mg, Rectal, Q6H PRN, Wylene Simmer, MD .  apixaban (ELIQUIS) tablet 5 mg, 5 mg, Oral, BID, Wylene Simmer, MD, 5 mg at 07/15/19 0924 .  bisacodyl (DULCOLAX) suppository 10 mg, 10 mg, Rectal, Daily PRN, Wylene Simmer, MD .  cholecalciferol (VITAMIN D3) tablet 1,000 Units, 1,000 Units, Oral, Daily, Wylene Simmer, MD, 1,000 Units at 07/15/19 [phone] .  citalopram (CELEXA) tablet 20 mg, 20 mg, Oral, Daily, Wylene Simmer, MD, 20 mg at 07/15/19 9892 .  diltiazem (CARDIZEM CD) 24 hr capsule 120 mg, 120 mg, Oral, BID,  Wylene Simmer, MD, 120 mg at 07/15/19 0925 .  docusate sodium (COLACE) capsule 100 mg, 100 mg, Oral, BID, Wylene Simmer, MD, 100 mg at 07/15/19 0924 .  ferrous sulfate tablet 325 mg, 325 mg, Oral, BID, Wylene Simmer, MD, 325 mg at 07/15/19 1194 .  furosemide (LASIX) tablet 20 mg, 20 mg, Oral, Julian Reil, MD, 20 mg at 07/15/19 0925 .  lactated ringers infusion, , Intravenous, Continuous, Wylene Simmer, MD, Last Rate: 50 mL/hr at 07/13/19 0501 .  levothyroxine (SYNTHROID) tablet 125 mcg, 125 mcg, Oral, QAC breakfast, Wylene Simmer, MD, 125 mcg at 07/15/19 0604 .  losartan (COZAAR) tablet 100 mg, 100 mg, Oral, Daily, Wylene Simmer, MD, 100 mg at 07/15/19 0924 .  magnesium citrate solution 1 Bottle, 1 Bottle, Oral, Once PRN, Wylene Simmer, MD .  magnesium oxide (MAG-OX) tablet 400 mg, 400 mg, Oral, Daily, Wylene Simmer, MD, 400 mg at 07/15/19 0924 .  metoCLOPramide (REGLAN) tablet 5 mg, 5 mg, Oral, Q8H PRN **OR** metoCLOPramide (REGLAN) injection 5 mg, 5 mg, Intravenous, Q8H PRN, Wylene Simmer, MD .  metoprolol succinate (TOPROL-XL) 24 hr tablet 25 mg, 25 mg, Oral, Daily, Wylene Simmer, MD, 25 mg at 07/15/19 1740 .  ondansetron (ZOFRAN) tablet 4 mg, 4 mg, Oral, Q6H PRN **OR** ondansetron (ZOFRAN) injection 4 mg, 4 mg, Intravenous, Q6H PRN, Wylene Simmer, MD .  oxyCODONE (Oxy IR/ROXICODONE) immediate release tablet 5 mg, 5 mg, Oral, Q6H PRN, Wylene Simmer, MD .  polyethylene glycol (MIRALAX / GLYCOLAX) packet 17 g, 17 g, Oral, Daily PRN, Wylene Simmer, MD, 17 g at 07/14/19 2045 .  senna (SENOKOT) tablet 8.6 mg, 1 tablet, Oral, BID, Wylene Simmer, MD, 8.6 mg at 07/15/19 0924 .  thiamine (VITAMIN B-1) tablet 100 mg, 100 mg, Oral, Daily, Wylene Simmer, MD, 100 mg at 07/15/19 8144 .  zolpidem (AMBIEN) tablet 5 mg, 5 mg, Oral, QHS PRN, Wylene Simmer, MD  Patients Current Diet:  Diet Order            Diet - low sodium heart healthy        Diet regular Room service appropriate? Yes; Fluid consistency: Thin  Diet  effective now              Precautions / Restrictions Precautions Precautions: Fall Restrictions  Weight Bearing Restrictions: Yes RLE Weight Bearing: Non weight bearing   Has the patient had 2 or more falls or a fall with injury in the past year? Yes  Prior Activity Level Limited Community (1-2x/wk): was not driving, limited outings since covid, appointments only, but was very active within the home  Prior Functional Level Self Care: Did the patient need help bathing, dressing, using the toilet or eating? Independent  Indoor Mobility: Did the patient need assistance with walking from room to room (with or without device)? Independent  Stairs: Did the patient need assistance with internal or external stairs (with or without device)? Needed some help  Functional Cognition: Did the patient need help planning regular tasks such as shopping or remembering to take medications? Independent  Home Assistive Devices / Equipment Home Assistive Devices/Equipment: None Home Equipment: Environmental consultant - 2 wheels, Shower seat - built in, [employer] - tub/shower, Hand held shower head, Bedside commode  Prior Device Use: Indicate devices/aids used by the patient prior to current illness, exacerbation or injury? Walker  Current Functional Level Cognition  Overall Cognitive Status: Within Functional Limits for tasks assessed Orientation Level: Oriented X4    Extremity Assessment (includes Sensation/Coordination)  Upper Extremity Assessment: RUE deficits/detail, LUE deficits/detail RUE Deficits / Details: generalized weakness, limited shoulder ROM from OA RUE Coordination: decreased gross motor LUE Deficits / Details: generalized weakness, limited shoulder ROM from OA LUE Coordination: decreased gross motor  Lower Extremity Assessment: Defer to PT evaluation RLE Deficits / Details: patient has 3/5 strength in right LE, otherwise WNL RLE Sensation: WNL    ADLs  Overall ADL's : Needs  assistance/impaired Eating/Feeding: Independent Grooming: Set up, Sitting Upper Body Bathing: Set up, Sitting Lower Body Bathing: Total assistance, Bed level Upper Body Dressing : Minimal assistance, Sitting Lower Body Dressing: Total assistance, Bed level Toilet Transfer: +2 for physical assistance, Total assistance, Squat-pivot Toileting- Clothing Manipulation and Hygiene: Total assistance, Bed level    Mobility  Overal bed mobility: Needs Assistance Bed Mobility: Supine to Sit, Sit to Supine, Rolling Rolling: Min assist Supine to sit: Mod assist Sit to supine: +2 for physical assistance, Mod assist General bed mobility comments: HOB up, increased time, use of rail, assist to raise trunk fully and for hips to EOB, assist for all aspects to return to supine for placement of lift pad, min assist to roll with rail    Transfers  Overall transfer level: Needs assistance Equipment used: Rolling walker (2 wheeled) Transfer via Lift Equipment: Stedy Transfers: Sit to/from Stand, Audiological scientist to Stand: +2 physical assistance, Mod assist Stand pivot transfers: +2 physical assistance, +2 safety/equipment, From elevated surface, Max assist Squat pivot transfers: +2 physical assistance, +2 safety/equipment, Max assist  Lateral/Scoot Transfers: +2 physical assistance, Total assist(attempted lateral scoot, pt unable to assist) General transfer comment: cues for hand placement, assist to rise, steady with therapist's foot under pt's R foot as a cue to avoid weight bearing    Ambulation / Gait / Stairs / Wheelchair Mobility  Ambulation/Gait General Gait Details: unable at this time    Posture / Balance Balance Overall balance assessment: Needs assistance Sitting-balance support: Feet supported Sitting balance-Leahy Scale: Good Standing balance support: Bilateral upper extremity supported Standing balance-Leahy Scale: Poor Standing balance comment: improved ability to maintain  NWB on R LE with shoe on L foot, tolerated x 1 minute before fatiguing    Special needs/care consideration BiPAP/CPAP no CPM no Continuous Drip IV no Dialysis no  Days n/a Life Vest no Oxygen no Special Bed no Trach Size no Wound Vac (area) no      Location n/a Skin incision to RLE                            Bowel mgmt: continent, last BM 8/3 Bladder mgmt: foley Diabetic mgmt: no Behavioral consideration no Chemo/radiation no   Previous Home Environment (from acute therapy documentation) Living Arrangements: Alone, Children Available Help at Discharge: Family, Available 24 hours/day Type of Home: House Home Layout: One level Home Access: Stairs to enter Entrance Stairs-Rails: None Entrance Stairs-Number of Steps: 1+1 Bathroom Shower/Tub: Multimedia programmer: Standard Home Care Services: No  Discharge Living Setting Plans for Discharge Living Setting: Patient's home Type of Home at Discharge: House Discharge Home Layout: Two level, Able to live on main level with bedroom/bathroom Discharge Home Access: Ramped entrance Discharge Bathroom Shower/Tub: Walk-in shower(built in seat) Discharge Bathroom Toilet: Standard Discharge Bathroom Accessibility: Yes How Accessible: Accessible via walker Does the patient have any problems obtaining your medications?: No  Social/Family/Support Systems Anticipated Caregiver: pt has 4 daughters and 2 sons Anticipated Caregiver's Contact Information: [name] (daughter) [phone] Ability/Limitations of Caregiver: family will rotate to provide 24/7 assist Caregiver Availability: 24/7 Discharge Plan Discussed with Primary Caregiver: Yes Is Caregiver In Agreement with Plan?: Yes Does Caregiver/Family have Issues with Lodging/Transportation while Pt is in Rehab?: No  Goals/Additional Needs Patient/Family Goal for Rehab: PT/OT supervision to mod I w/c level, min assist for ambulation Expected length of stay: 18-21  days Dietary Needs: reg/thin Equipment Needs: tbd Pt/Family Agrees to Admission and willing to participate: Yes Program Orientation Provided & Reviewed with Pt/Caregiver Including Roles  & Responsibilities: Yes  Decrease burden of Care through IP rehab admission: n/a  Possible need for SNF placement upon discharge:  Not anticipated.  Spoke with daughter, [name], and pt and both agree that family will provide 24/7 assist, if needed, when d/c from CIR is appropriate.   Patient Condition: I have reviewed medical records from [facility], spoken with CM, and patient and daughter. I met with patient at the bedside for inpatient rehabilitation assessment.  Patient will benefit from ongoing PT and OT, can actively participate in 3 hours of therapy a day 5 days of the week, and can make measurable gains during the admission.  Patient will also benefit from the coordinated team approach during an Inpatient Acute Rehabilitation admission.  The patient will receive intensive therapy as well as Rehabilitation physician, nursing, social worker, and care management interventions.  Due to safety, skin/wound care, disease management, medication administration, pain management and patient education the patient requires 24 hour a day rehabilitation nursing.  The patient is currently max +2 to total with mobility and basic ADLs.  Discharge setting and therapy post discharge at home with home health is anticipated.  Patient has agreed to participate in the Acute Inpatient Rehabilitation Program and will admit today.  Preadmission Screen Completed By:  Michel Santee, PT, DPT 07/15/2019 12:39 PM ______________________________________________________________________   Discussed status with Dr. Posey Pronto on 07/15/19  at 1:28 PM  and received approval for admission today.  Admission Coordinator:  Michel Santee, PT, DPT time 1:28 PM Sudie Grumbling 07/15/19    Assessment/Plan: Diagnosis: R tib/fib fx  1. Does the need for  close, 24 hr/day Medical supervision in concert with the patient's rehab needs make it unreasonable for this patient to be served in a less  intensive setting? Potentially  2. Co-Morbidities requiring supervision/potential complications: HTN, a fib, diastolic CHF, obesity, anemia, and hyperkalemia, acute on chronic anemia, and pain control 3. Due to safety, skin/wound care, disease management, pain management and patient education, does the patient require 24 hr/day rehab nursing? Potentially 4. Does the patient require coordinated care of a physician, rehab nurse, PT (1-2 hrs/day, 5 days/week) and OT (1-2 hrs/day, 5 days/week) to address physical and functional deficits in the context of the above medical diagnosis(es)? Potentially Addressing deficits in the following areas: balance, endurance, locomotion, strength, transferring, bathing, dressing, toileting and psychosocial support 5. Can the patient actively participate in an intensive therapy program of at least 3 hrs of therapy 5 days a week? Potentially 6. The potential for patient to make measurable gains while on inpatient rehab is good 7. Anticipated functional outcomes upon discharge from inpatients are: mod assist PT, mod assist OT, n/a SLP 8. Estimated rehab length of stay to reach the above functional goals is: 17-21 days. 9. Anticipated D/C setting: Home 10. Anticipated post D/C treatments: HH therapy and Home excercise program 11. Overall Rehab/Functional Prognosis: good  MD Signature: Delice Lesch, MD, ABPMR

## 2019-07-15 NOTE — Progress Notes (Signed)
Jamse Arn, MD  Physician  Physical Medicine and Rehabilitation  PMR Pre-admission  Signed  Date of Service:  07/15/2019 12:39 PM      Related encounter: ED to Hosp-Admission (Current) from 07/12/2019 in [place]         Show:Clear all _0 Manual_1 Template_2 Copied  Added by: _3 Jamse Arn, MD_4 Michel Santee, PT  _5 Hover for details PMR Admission Coordinator Pre-Admission Assessment  Patient: Katherine Tyler is an 85 y.o., female MRN: [id number] DOB: 1929-08-12 Height: _6  (160 cm) Weight: 110.2 kg  Insurance Information HMO:     PPO:      PCP:      IPA:      80/20:      OTHER:  PRIMARY: Medicare A and B      Policy#: [id number]      Subscriber: patient CM Name:       Phone#:      Fax#:  Pre-Cert#: verified on passport one      Employer:  Benefits:  Phone #:      Name:  Eff. Date: 05/10/1996 A and B     Deduct: $1408      Out of Pocket Max: n/a      Life Max: n/a CIR: 100%      SNF: 20 full days Outpatient: 80%     Co-Pay: 20% Home Health: 100%      Co-Pay:  DME: 80%     Co-Pay: 20% Providers: pt choice SECONDARY: Medicaid of [place]      Policy#: [id number]      Subscriber: patient CM Name:       Phone#:      Fax#:  Pre-Cert#: verified eligibility online, coverage code [place]      Employer:  Benefits:  Phone #:      Name:  Eff. Date:      Deduct:       Out of Pocket Max:       Life Max:  CIR:       SNF:  Outpatient:      Co-Pay:  Home Health:       Co-Pay:  DME:      Co-Pay:   Medicaid Application Date:       Case Manager:  Disability Application Date:       Case Worker:   The "Data Collection Information Summary" for patients in Inpatient Rehabilitation Facilities with attached "Privacy Act [place] Records" was provided and verbally reviewed with: Patient and Family  Emergency Contact Information         Contact Information    Name Relation Home Work Nora  Daughter [phone]        Current Medical History  Patient Admitting Diagnosis: R tib/fib fx  History of Present Illness: Pt is an 85 y/o female with PMH of HTN, a fib, diastolic CHF, obesity, anemia, and hyperkalemia who presented to [facility] ED following a fall at home.  She states she was walking in her home and her ankle gave way and she fell backwards.  She states her ankle broke and her bone was through the skin prior to the fall, just as she was putting weight on it.  Orthopedics was consulted and recommended emergent ORIF with Dr. Doran Durand. Hospital course acute on chronic anemia, and pain control.  Therapy evaluations completed and pt recommended for CIR.      Patient's medical record from [facility] has been reviewed by the rehabilitation admission coordinator and physician.  Past Medical History      Past Medical History:  Diagnosis Date  . Anxiety   . Arthritis   . Chronic diastolic CHF (congestive heart failure) ([place])   . GERD (gastroesophageal reflux disease)   . Hiatal hernia   . Hyperkalemia   . Hyperlipidemia   . Hypertension   . Hypothyroidism   . Iron deficiency anemia   . Obesity   . PAF (paroxysmal atrial fibrillation) ([place])    a. diagnosed in 01/2018. Started on Eliquis for anticoagulation.   . Vitamin D deficiency     Family History   family history includes Diabetes in her daughter; Heart disease in her brother, brother, brother, mother, sister, sister, and sister; Stroke in her father.  Prior Rehab/Hospitalizations Has the patient had prior rehab or hospitalizations prior to admission? No  Has the patient had major surgery during 100 days prior to admission? Yes             Current Medications  Current Facility-Administered Medications:  .  acetaminophen (TYLENOL) tablet 650 mg, 650 mg, Oral, Q6H PRN, 650 mg at 07/15/19 0603 **OR** acetaminophen (TYLENOL) suppository 650 mg, 650 mg, Rectal, Q6H PRN, Wylene Simmer,  MD .  apixaban (ELIQUIS) tablet 5 mg, 5 mg, Oral, BID, Wylene Simmer, MD, 5 mg at 07/15/19 0924 .  bisacodyl (DULCOLAX) suppository 10 mg, 10 mg, Rectal, Daily PRN, Wylene Simmer, MD .  cholecalciferol (VITAMIN D3) tablet 1,000 Units, 1,000 Units, Oral, Daily, Wylene Simmer, MD, 1,000 Units at 07/15/19 [phone] .  citalopram (CELEXA) tablet 20 mg, 20 mg, Oral, Daily, Wylene Simmer, MD, 20 mg at 07/15/19 7867 .  diltiazem (CARDIZEM CD) 24 hr capsule 120 mg, 120 mg, Oral, BID, Wylene Simmer, MD, 120 mg at 07/15/19 0925 .  docusate sodium (COLACE) capsule 100 mg, 100 mg, Oral, BID, Wylene Simmer, MD, 100 mg at 07/15/19 0924 .  ferrous sulfate tablet 325 mg, 325 mg, Oral, BID, Wylene Simmer, MD, 325 mg at 07/15/19 6720 .  furosemide (LASIX) tablet 20 mg, 20 mg, Oral, Julian Reil, MD, 20 mg at 07/15/19 0925 .  lactated ringers infusion, , Intravenous, Continuous, Wylene Simmer, MD, Last Rate: 50 mL/hr at 07/13/19 0501 .  levothyroxine (SYNTHROID) tablet 125 mcg, 125 mcg, Oral, QAC breakfast, Wylene Simmer, MD, 125 mcg at 07/15/19 0604 .  losartan (COZAAR) tablet 100 mg, 100 mg, Oral, Daily, Wylene Simmer, MD, 100 mg at 07/15/19 0924 .  magnesium citrate solution 1 Bottle, 1 Bottle, Oral, Once PRN, Wylene Simmer, MD .  magnesium oxide (MAG-OX) tablet 400 mg, 400 mg, Oral, Daily, Wylene Simmer, MD, 400 mg at 07/15/19 0924 .  metoCLOPramide (REGLAN) tablet 5 mg, 5 mg, Oral, Q8H PRN **OR** metoCLOPramide (REGLAN) injection 5 mg, 5 mg, Intravenous, Q8H PRN, Wylene Simmer, MD .  metoprolol succinate (TOPROL-XL) 24 hr tablet 25 mg, 25 mg, Oral, Daily, Wylene Simmer, MD, 25 mg at 07/15/19 9470 .  ondansetron (ZOFRAN) tablet 4 mg, 4 mg, Oral, Q6H PRN **OR** ondansetron (ZOFRAN) injection 4 mg, 4 mg, Intravenous, Q6H PRN, Wylene Simmer, MD .  oxyCODONE (Oxy IR/ROXICODONE) immediate release tablet 5 mg, 5 mg, Oral, Q6H PRN, Wylene Simmer, MD .  polyethylene glycol (MIRALAX / GLYCOLAX) packet 17 g, 17 g, Oral, Daily PRN,  Wylene Simmer, MD, 17 g at 07/14/19 2045 .  senna (SENOKOT) tablet 8.6 mg, 1 tablet, Oral, BID, Wylene Simmer, MD, 8.6 mg at 07/15/19 0924 .  thiamine (VITAMIN B-1) tablet 100 mg, 100 mg, Oral, Daily, Wylene Simmer, MD, 100 mg at 07/15/19 0102 .  zolpidem (AMBIEN) tablet 5 mg, 5 mg, Oral, QHS PRN, Wylene Simmer, MD  Patients Current Diet:     Diet Order                  Diet - low sodium heart healthy         Diet regular Room service appropriate? Yes; Fluid consistency: Thin  Diet effective now               Precautions / Restrictions Precautions Precautions: Fall Restrictions Weight Bearing Restrictions: Yes RLE Weight Bearing: Non weight bearing   Has the patient had 2 or more falls or a fall with injury in the past year? Yes  Prior Activity Level Limited Community (1-2x/wk): was not driving, limited outings since covid, appointments only, but was very active within the home  Prior Functional Level Self Care: Did the patient need help bathing, dressing, using the toilet or eating? Independent  Indoor Mobility: Did the patient need assistance with walking from room to room (with or without device)? Independent  Stairs: Did the patient need assistance with internal or external stairs (with or without device)? Needed some help  Functional Cognition: Did the patient need help planning regular tasks such as shopping or remembering to take medications? Independent  Home Assistive Devices / Equipment Home Assistive Devices/Equipment: None Home Equipment: Environmental consultant - 2 wheels, Shower seat - built in, [employer] - tub/shower, Hand held shower head, Bedside commode  Prior Device Use: Indicate devices/aids used by the patient prior to current illness, exacerbation or injury? Walker  Current Functional Level Cognition  Overall Cognitive Status: Within Functional Limits for tasks assessed Orientation Level: Oriented X4    Extremity Assessment (includes  Sensation/Coordination)  Upper Extremity Assessment: RUE deficits/detail, LUE deficits/detail RUE Deficits / Details: generalized weakness, limited shoulder ROM from OA RUE Coordination: decreased gross motor LUE Deficits / Details: generalized weakness, limited shoulder ROM from OA LUE Coordination: decreased gross motor  Lower Extremity Assessment: Defer to PT evaluation RLE Deficits / Details: patient has 3/5 strength in right LE, otherwise WNL RLE Sensation: WNL    ADLs  Overall ADL's : Needs assistance/impaired Eating/Feeding: Independent Grooming: Set up, Sitting Upper Body Bathing: Set up, Sitting Lower Body Bathing: Total assistance, Bed level Upper Body Dressing : Minimal assistance, Sitting Lower Body Dressing: Total assistance, Bed level Toilet Transfer: +2 for physical assistance, Total assistance, Squat-pivot Toileting- Clothing Manipulation and Hygiene: Total assistance, Bed level    Mobility  Overal bed mobility: Needs Assistance Bed Mobility: Supine to Sit, Sit to Supine, Rolling Rolling: Min assist Supine to sit: Mod assist Sit to supine: +2 for physical assistance, Mod assist General bed mobility comments: HOB up, increased time, use of rail, assist to raise trunk fully and for hips to EOB, assist for all aspects to return to supine for placement of lift pad, min assist to roll with rail    Transfers  Overall transfer level: Needs assistance Equipment used: Rolling walker (2 wheeled) Transfer via Lift Equipment: Stedy Transfers: Sit to/from Stand, Audiological scientist to Stand: +2 physical assistance, Mod assist Stand pivot transfers: +2 physical assistance, +2 safety/equipment, From elevated surface, Max assist Squat pivot transfers: +2 physical assistance, +2 safety/equipment, Max assist  Lateral/Scoot Transfers: +2 physical assistance, Total assist(attempted lateral scoot, pt unable to assist) General transfer comment: cues for hand placement,  assist to rise, steady with  therapist's foot under pt's R foot as a cue to avoid weight bearing    Ambulation / Gait / Stairs / Wheelchair Mobility  Ambulation/Gait General Gait Details: unable at this time    Posture / Balance Balance Overall balance assessment: Needs assistance Sitting-balance support: Feet supported Sitting balance-Leahy Scale: Good Standing balance support: Bilateral upper extremity supported Standing balance-Leahy Scale: Poor Standing balance comment: improved ability to maintain NWB on R LE with shoe on L foot, tolerated x 1 minute before fatiguing    Special needs/care consideration BiPAP/CPAP no CPM no Continuous Drip IV no Dialysis no        Days n/a Life Vest no Oxygen no Special Bed no Trach Size no Wound Vac (area) no      Location n/a Skin incision to RLE                            Bowel mgmt: continent, last BM 8/3 Bladder mgmt: foley Diabetic mgmt: no Behavioral consideration no Chemo/radiation no   Previous Home Environment (from acute therapy documentation) Living Arrangements: Alone, Children Available Help at Discharge: Family, Available 24 hours/day Type of Home: House Home Layout: One level Home Access: Stairs to enter Entrance Stairs-Rails: None Technical brewer of Steps: 1+1 Bathroom Shower/Tub: Multimedia programmer: Standard Home Care Services: No  Discharge Living Setting Plans for Discharge Living Setting: Patient's home Type of Home at Discharge: House Discharge Home Layout: Two level, Able to live on main level with bedroom/bathroom Discharge Home Access: Ramped entrance Discharge Bathroom Shower/Tub: Walk-in shower(built in seat) Discharge Bathroom Toilet: Standard Discharge Bathroom Accessibility: Yes How Accessible: Accessible via walker Does the patient have any problems obtaining your medications?: No  Social/Family/Support Systems Anticipated Caregiver: pt has 4 daughters and 2 sons  Anticipated Caregiver's Contact Information: [name] (daughter) [phone] Ability/Limitations of Caregiver: family will rotate to provide 24/7 assist Caregiver Availability: 24/7 Discharge Plan Discussed with Primary Caregiver: Yes Is Caregiver In Agreement with Plan?: Yes Does Caregiver/Family have Issues with Lodging/Transportation while Pt is in Rehab?: No  Goals/Additional Needs Patient/Family Goal for Rehab: PT/OT supervision to mod I w/c level, min assist for ambulation Expected length of stay: 18-21 days Dietary Needs: reg/thin Equipment Needs: tbd Pt/Family Agrees to Admission and willing to participate: Yes Program Orientation Provided & Reviewed with Pt/Caregiver Including Roles  & Responsibilities: Yes  Decrease burden of Care through IP rehab admission: n/a  Possible need for SNF placement upon discharge:  Not anticipated.  Spoke with daughter, [name], and pt and both agree that family will provide 24/7 assist, if needed, when d/c from CIR is appropriate.   Patient Condition: I have reviewed medical records from [facility], spoken with CM, and patient and daughter. I met with patient at the bedside for inpatient rehabilitation assessment.  Patient will benefit from ongoing PT and OT, can actively participate in 3 hours of therapy a day 5 days of the week, and can make measurable gains during the admission.  Patient will also benefit from the coordinated team approach during an Inpatient Acute Rehabilitation admission.  The patient will receive intensive therapy as well as Rehabilitation physician, nursing, social worker, and care management interventions.  Due to safety, skin/wound care, disease management, medication administration, pain management and patient education the patient requires 24 hour a day rehabilitation nursing.  The patient is currently max +2 to total with mobility and basic ADLs.  Discharge setting and therapy post discharge at home  with home health  is anticipated.  Patient has agreed to participate in the Acute Inpatient Rehabilitation Program and will admit today.  Preadmission Screen Completed By:  Michel Santee, PT, DPT 07/15/2019 12:39 PM ______________________________________________________________________   Discussed status with Dr. Posey Pronto on 07/15/19  at 1:28 PM  and received approval for admission today.  Admission Coordinator:  Michel Santee, PT, DPT time 1:28 PM Sudie Grumbling 07/15/19    Assessment/Plan: Diagnosis: R tib/fib fx  1. Does the need for close, 24 hr/day Medical supervision in concert with the patient's rehab needs make it unreasonable for this patient to be served in a less intensive setting? Potentially  2. Co-Morbidities requiring supervision/potential complications: HTN, a fib, diastolic CHF, obesity, anemia, and hyperkalemia, acute on chronic anemia, and pain control 3. Due to safety, skin/wound care, disease management, pain management and patient education, does the patient require 24 hr/day rehab nursing? Potentially 4. Does the patient require coordinated care of a physician, rehab nurse, PT (1-2 hrs/day, 5 days/week) and OT (1-2 hrs/day, 5 days/week) to address physical and functional deficits in the context of the above medical diagnosis(es)? Potentially Addressing deficits in the following areas: balance, endurance, locomotion, strength, transferring, bathing, dressing, toileting and psychosocial support 5. Can the patient actively participate in an intensive therapy program of at least 3 hrs of therapy 5 days a week? Potentially 6. The potential for patient to make measurable gains while on inpatient rehab is good 7. Anticipated functional outcomes upon discharge from inpatients are: mod assist PT, mod assist OT, n/a SLP 8. Estimated rehab length of stay to reach the above functional goals is: 17-21 days. 9. Anticipated D/C setting: Home 10. Anticipated post D/C treatments: HH therapy and Home excercise  program 11. Overall Rehab/Functional Prognosis: good  MD Signature: Delice Lesch, MD, ABPMR        Revision History

## 2019-07-15 NOTE — H&P (Signed)
Physical Medicine and Rehabilitation Admission H&P    Chief Complaint  Patient presents with  .  Functional deficits due to fracture   HPI: Katherine Tyler is an 85 year old female with history of HTN, PAF--on Eliquis, obesity, chronic diastolic CHF who was admitted on 07/12/2019 after a fall resulting in right ankle pain.  History taken from chart review and daughter.  On evaluation, she was found to have right open displaced trimalleolar fracture.  She was taken to the OR by Dr. Doran Durand for ORIF right ankle trimalleolar fracture and internal fixation of right ankle syndesmosis disruption.  Postoperatively she is to be nonweightbearing on her right lower extremity.  Her Eliquis was resumed.  Acute blood loss anemia with drop in hemoglobin to 6.7 and required 1 unit of packed red blood cells. Acute on chronic renal failure has improved.  Therapy ongoing and she continues to have difficulty maintaining nonweightbearing status and currently limited by weakness and pain.  CIR recommended due to functional decline.  Please see preadmission assessment from earlier today as well.  Review of Systems  Constitutional: Negative for fever.  HENT: Positive for hearing loss. Negative for tinnitus.   Eyes: Negative for blurred vision and double vision.  Respiratory: Positive for cough. Negative for shortness of breath.   Cardiovascular: Negative for chest pain and palpitations.  Gastrointestinal: Negative for heartburn and nausea.  Genitourinary: Negative for dysuria and urgency.  Musculoskeletal: Positive for joint pain. Negative for myalgias.  Skin: Negative for rash.  Neurological: Positive for weakness. Negative for dizziness, sensory change and headaches.  All other systems reviewed and are negative.  Past Medical History:  Diagnosis Date  . Anxiety   . Arthritis   . Chronic diastolic CHF (congestive heart failure) ([place])   . GERD (gastroesophageal reflux disease)   . Hiatal hernia   .  Hyperkalemia   . Hyperlipidemia   . Hypertension   . Hypothyroidism   . Iron deficiency anemia   . Obesity   . PAF (paroxysmal atrial fibrillation) ([place])    a. diagnosed in 01/2018. Started on Eliquis for anticoagulation.   . Vitamin D deficiency     Past Surgical History:  Procedure Laterality Date  . CHOLECYSTECTOMY    . I&D EXTREMITY Right 07/12/2019   Procedure: Irrigation And Debridement Extremity;  Surgeon: Wylene Simmer, MD;  Location: [facility];  Service: Orthopedics;  Laterality: Right;  . ORIF ANKLE FRACTURE Right 07/12/2019   Procedure: OPEN  AND REDUCTION INTERNAL FIXATION (ORIF) ANKLE FRACTURE;  Surgeon: Wylene Simmer, MD;  Location: [facility];  Service: Orthopedics;  Laterality: Right;  . TUBAL LIGATION  1960    Family History  Problem Relation Age of Onset  . Heart disease Mother   . Stroke Father   . Heart disease Sister   . Heart disease Brother   . Heart disease Sister   . Heart disease Sister   . Heart disease Brother   . Heart disease Brother   . Diabetes Daughter     Social History:  Lives alone. 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  Independent with walker PTA. Daughter helps with home mangement and family checks daily. She reports that she has never smoked. She quit smokeless tobacco use about 62 years ago.  Her smokeless tobacco use included snuff. She reports that she does not drink alcohol or use drugs.     Allergies  Allergen Reactions  . Terazosin Hcl Other (See Comments)    Muscle aches  . Keflex [  Cephalexin] Other (See Comments)    unknown    Medications Prior to Admission  Medication Sig Dispense Refill  . apixaban (ELIQUIS) 5 MG TABS tablet Take 1 tablet (5 mg total) by mouth 2 (two) times daily. 180 tablet 3  . Cholecalciferol (D3-1000) 1000 UNITS capsule Take 1,000 Units by mouth daily.     . citalopram (CELEXA) 20 MG tablet Take 1 tablet by mouth once daily (Patient taking differently: Take 20 mg by mouth daily. ) 90 tablet 0  . diltiazem  (CARDIZEM CD) 120 MG 24 hr capsule Take 1 capsule (120 mg total) by mouth 2 (two) times daily. 180 capsule 3  . Ferrous Sulfate Dried (SLOW RELEASE IRON) 45 MG TBCR Take 45 mg by mouth 2 (two) times daily. 60 tablet 2  . furosemide (LASIX) 20 MG tablet Take 1 tablet (20 mg total) by mouth every other day. 45 tablet 3  . levothyroxine (SYNTHROID, LEVOTHROID) 125 MCG tablet Take 125 mcg by mouth daily before breakfast.    . losartan (COZAAR) 100 MG tablet Take 1 tablet (100 mg total) by mouth daily. 90 tablet 3  . magnesium oxide (MAG-OX) 400 (241.3 MG) MG tablet Take 1 tablet (400 mg total) by mouth daily. 30 tablet 0  . metoprolol succinate (TOPROL XL) 25 MG 24 hr tablet Take 1 tablet (25 mg total) by mouth daily. 90 tablet 3  . nystatin (MYCOSTATIN/NYSTOP) powder Apply topically 4 (four) times daily. 15 g 0  . thiamine (VITAMIN B-1) 100 MG tablet Take 100 mg by mouth daily.      Drug Regimen Review  Drug regimen was reviewed and remains appropriate with no significant issues identified  Home: Home Living Family/patient expects to be discharged to:: Private residence Living Arrangements: Alone, Children Available Help at Discharge: Family, Available 24 hours/day Type of Home: House Home Access: Stairs to enter Secretary/administratorntrance Stairs-Number of Steps: 1+1 Entrance Stairs-Rails: None Home Layout: One level Bathroom Shower/Tub: Health visitorWalk-in shower Bathroom Toilet: Standard Home Equipment: Environmental consultantWalker - 2 wheels, Shower seat - built in, Coventry Health Carerab bars - tub/shower, Hand held shower head, Bedside commode   Functional History: Prior Function Level of Independence: Independent with assistive device(s) Gait / Transfers Assistance Needed: ambulated with RW ADL's / Homemaking Assistance Needed: assist for socks, otherwise modified independent in ADL, meal prep and light housekeeping  Functional Status:  Mobility: Bed Mobility Overal bed mobility: Needs Assistance Bed Mobility: Supine to Sit, Sit to Supine,  Rolling Rolling: Min assist Supine to sit: Mod assist Sit to supine: +2 for physical assistance, Mod assist General bed mobility comments: increased time and effort, HOB elevated, use of rails, assistance needed for trunk elevation and for bilateral LE return onto bed Transfers Overall transfer level: Needs assistance Equipment used: Rolling walker (2 wheeled) Transfer via Lift Equipment: Stedy Transfers: Sit to/from Stand, Nurse, learning disabilityLateral/Scoot Transfers Sit to Stand: +2 physical assistance, Mod assist Stand pivot transfers: +2 physical assistance, +2 safety/equipment, From elevated surface, Max assist Squat pivot transfers: +2 physical assistance, +2 safety/equipment, Max assist  Lateral/Scoot Transfers: +2 physical assistance, Total assist General transfer comment: attempted lateral scoot to w/c but pt unsuccessful. pt able to perform sit<>stand from bed with mod A x2 with bed in an elevated position; however, she was unable to hop or pivot on her L LE; therefore maximove was utilized for transfer OOB to chair Ambulation/Gait General Gait Details: unable at this time    ADL: ADL Overall ADL's : Needs assistance/impaired Eating/Feeding: Independent Grooming: Set up, Sitting Upper Body  Bathing: Set up, Sitting Lower Body Bathing: Total assistance, Bed level Upper Body Dressing : Minimal assistance, Sitting Lower Body Dressing: Total assistance, Bed level Toilet Transfer: +2 for physical assistance, Total assistance, Squat-pivot Toileting- Clothing Manipulation and Hygiene: Total assistance, Bed level  Cognition: Cognition Overall Cognitive Status: Within Functional Limits for tasks assessed Orientation Level: Oriented X4 Cognition Arousal/Alertness: Awake/alert Behavior During Therapy: WFL for tasks assessed/performed Overall Cognitive Status: Within Functional Limits for tasks assessed  Physical Exam: Blood pressure (!) 148/92, pulse 68, temperature 98.4 F (36.9 C), temperature  source Oral, resp. rate 15, height 5\' 3"  (1.6 m), weight 110.2 kg, SpO2 94 %. Physical Exam  Nursing note and vitals reviewed. Constitutional: She is oriented to person, place, and time. She appears well-developed. No distress.  Morbidly obese  HENT:  Head: Normocephalic and atraumatic.  Eyes: EOM are normal. Right eye exhibits no discharge. Left eye exhibits no discharge.  Respiratory: Effort normal. No stridor. No respiratory distress.  GI: She exhibits no distension.  Musculoskeletal:        General: Tenderness and edema present.     Comments: Right ankle with ace wrap  Neurological: She is alert and oriented to person, place, and time.  HOH   Motor: Bilateral upper extremities: 4-/5 proximal distal Right lower extremity: Hip flexion, knee extension 3+/5, wiggles toes Sensation intact light touch Left lower extremity: 4 medicine/5 proximal distal  Skin: She is not diaphoretic.  Right lower extremity with dressing C/D/  Psychiatric:  Pleasantly confused    Results for orders placed or performed during the hospital encounter of 07/12/19 (from the past 48 hour(s))  CBC     Status: Abnormal   Collection Time: 07/14/19  4:57 AM  Result Value Ref Range   WBC 7.7 4.0 - 10.5 K/uL   RBC 2.07 (L) 3.87 - 5.11 MIL/uL   Hemoglobin 6.7 (LL) 12.0 - 15.0 g/dL    Comment: REPEATED TO VERIFY THIS CRITICAL RESULT HAS VERIFIED AND BEEN CALLED TO R.SNEIDERMAN,RN BY MELISSA BROGDON ON 08 04 2020 AT 0553, AND HAS BEEN READ BACK.     HCT 20.9 (L) 36.0 - 46.0 %   MCV 101.0 (H) 80.0 - 100.0 fL   MCH 32.4 26.0 - 34.0 pg   MCHC 32.1 30.0 - 36.0 g/dL   RDW 16.115.1 09.611.5 - 04.515.5 %   Platelets 114 (L) 150 - 400 K/uL    Comment: REPEATED TO VERIFY PLATELET COUNT CONFIRMED BY SMEAR SPECIMEN CHECKED FOR CLOTS Immature Platelet Fraction may be clinically indicated, consider ordering this additional test [id number]    nRBC 0.0 0.0 - 0.2 %    Comment: Performed at [facility], [address]  [place], [place] [address]  Basic metabolic panel     Status: Abnormal   Collection Time: 07/14/19  4:57 AM  Result Value Ref Range   Sodium 130 (L) 135 - 145 mmol/L   Potassium 4.6 3.5 - 5.1 mmol/L   Chloride 97 (L) 98 - 111 mmol/L   CO2 25 22 - 32 mmol/L   Glucose, Bld 101 (H) 70 - 99 mg/dL   BUN 31 (H) 8 - 23 mg/dL   Creatinine, Ser 5.621.24 (H) 0.44 - 1.00 mg/dL   Calcium 8.6 (L) 8.9 - 10.3 mg/dL   GFR calc non Af Amer 39 (L) >60 mL/min   GFR calc Af Amer 45 (L) >60 mL/min   Anion gap 8 5 - 15    Comment: Performed at [facility], [address] [place], [place] [address]  Magnesium     Status: None   Collection Time: 07/14/19  4:57 AM  Result Value Ref Range   Magnesium 2.3 1.7 - 2.4 mg/dL    Comment: Performed at [facility], [place] [address] [place], [place] [address]  Type and screen Bath     Status: None   Collection Time: 07/14/19  7:15 AM  Result Value Ref Range   ABO/RH(D) A POS    Antibody Screen NEG    Sample Expiration 07/17/2019,2359    Unit Number [id number]    Blood Component Type RBC LR PHER1    Unit division 00    Status of Unit ISSUED,FINAL    Transfusion Status OK TO TRANSFUSE    Crossmatch Result      Compatible Performed at [facility], [place] [address], [place], [place] [address]   Prepare RBC     Status: None   Collection Time: 07/14/19  7:15 AM  Result Value Ref Range   Order Confirmation      ORDER PROCESSED BY BLOOD BANK Performed at [facility], [place] [address], [place], [place] [address]   ABO/Rh     Status: None   Collection Time: 07/14/19  7:15 AM  Result Value Ref Range   ABO/RH(D)      A POS Performed at [facility] [address] [place], [place] [address]   Hemoglobin and hematocrit, blood     Status: Abnormal   Collection Time: 07/14/19  3:59 PM  Result Value Ref Range   Hemoglobin 8.4 (L) 12.0 - 15.0 g/dL    Comment: REPEATED TO VERIFY POST TRANSFUSION SPECIMEN    HCT  26.2 (L) 36.0 - 46.0 %    Comment: Performed at [facility] [address] [place], [place] [address]  CBC     Status: Abnormal   Collection Time: 07/15/19  2:39 AM  Result Value Ref Range   WBC 7.5 4.0 - 10.5 K/uL   RBC 2.57 (L) 3.87 - 5.11 MIL/uL   Hemoglobin 8.2 (L) 12.0 - 15.0 g/dL   HCT 25.7 (L) 36.0 - 46.0 %   MCV 100.0 80.0 - 100.0 fL   MCH 31.9 26.0 - 34.0 pg   MCHC 31.9 30.0 - 36.0 g/dL   RDW 16.3 (H) 11.5 - 15.5 %   Platelets 128 (L) 150 - 400 K/uL   nRBC 0.0 0.0 - 0.2 %    Comment: Performed at [facility], [place] [address] [place], [place] [address]  Basic metabolic panel     Status: Abnormal   Collection Time: 07/15/19  2:39 AM  Result Value Ref Range   Sodium 134 (L) 135 - 145 mmol/L   Potassium 4.6 3.5 - 5.1 mmol/L   Chloride 99 98 - 111 mmol/L   CO2 26 22 - 32 mmol/L   Glucose, Bld 101 (H) 70 - 99 mg/dL   BUN 27 (H) 8 - 23 mg/dL   Creatinine, Ser 1.21 (H) 0.44 - 1.00 mg/dL   Calcium 8.8 (L) 8.9 - 10.3 mg/dL   GFR calc non Af Amer 40 (L) >60 mL/min   GFR calc Af Amer 46 (L) >60 mL/min   Anion gap 9 5 - 15    Comment: Performed at [facility] [address], [place], [place] [address]   No results found.     Medical Problem List and Plan:  1.  Deficits with mobility, transfers, self-care secondary to right ankle fracture.  Admit to CIR 2.  Antithrombotics: -DVT/anticoagulation:  Pharmaceutical: Other (comment) Eliquis  -antiplatelet therapy: N/A 3. Pain Management: Continue oxycodone as needed 4. Mood: Team to provide ego support for chronic anxiety.  LCSW to follow for evaluation and support  -antipsychotic agents: N/A 5. Neuropsych: This patient is not fully capable of making decisions on her own behalf. 6. Skin/Wound Care: Monitor incision for healing daily.  Routine pressure relief measures 7. Fluids/Electrolytes/Nutrition: Monitor I's and O's.  Offer supplements as needed if intake poor. 8.  Acute on chronic renal failure: Has  greatly improved with IV fluids--DC due to history of CHF.   Labs ordered for tomorrow a.m.  9. Iron deficiency anemia with ABLA: Continue iron supplement  Labs ordered for tomorrow a.m. 10. HTN: Monitor blood pressures and watch for signs of orthostasis.  Continue losartan, diltiazem, metoprolol, and furosemide daily.  Monitor with increased mobility 11. Chronic diastolic CHF: Discontinue IV fluids. Ccontinue losartan, metoprolol, furosemide.  Daily weights 12.  PAF: Monitor heart rate TID.  Continue diltiazem and metoprolol.  Monitor with increased activity 13.  Anxiety disorder: Continue Celexa daily  Jacquelynn Creeamela S Love, PA-C 07/15/2019

## 2019-07-15 NOTE — Care Management Important Message (Signed)
Important Message  Patient Details  Name: Katherine Tyler MRN: [id number] Date of Birth: 1929-10-18   Medicare Important Message Given:  Yes     Memory Argue 07/15/2019, 3:40 PM

## 2019-07-15 NOTE — Progress Notes (Signed)
Report given to [facility], RN - ready for transfer to  [place]

## 2019-07-15 NOTE — Progress Notes (Signed)
Occupational Therapy Treatment Patient Details Name: Katherine Tyler MRN: [id number] DOB: 07/16/1929 Today's Date: 07/15/2019    History of present illness Patient is an [age] year old female admitted after fall at home. Found to have distal tibia/fibula fx. S/P ORIF 07/12/19. PMH to include: HTN, Obesity, Afib, HLD, Gerd, Anxiety, CHF, OA.   OT comments  [name] daughter, Katherine Tyler, observed session. Pt demonstrating improvement in ability to stand maintaining NWB on R LE, but still not able to pivot or hop with RW. Attempted lateral scoot from bed to w/c, pt unable to assist. Finally returned pt to supine and placed lift pad and transferred with maximove. Updated d/c equipment recommendations to include hoyer lift. Daughter is hopeful pt will be able to stand and pivot prior to return home.  Follow Up Recommendations  CIR    Equipment Recommendations  Wheelchair (measurements OT);Wheelchair cushion (measurements OT);Hospital bed(hoyer lift)    Recommendations for Other Services      Precautions / Restrictions Precautions Precautions: Fall Restrictions Weight Bearing Restrictions: Yes RLE Weight Bearing: Non weight bearing       Mobility Bed Mobility Overal bed mobility: Needs Assistance Bed Mobility: Supine to Sit;Sit to Supine;Rolling Rolling: Min assist   Supine to sit: Mod assist Sit to supine: +2 for physical assistance;Mod assist   General bed mobility comments: HOB up, increased time, use of rail, assist to raise trunk fully and for hips to EOB, assist for all aspects to return to supine for placement of lift pad, min assist to roll with rail  Transfers Overall transfer level: Needs assistance Equipment used: Rolling walker (2 wheeled) Transfers: Sit to/from Stand;Lateral/Scoot Transfers Sit to Stand: +2 physical assistance;Mod assist        Lateral/Scoot Transfers: +2 physical assistance;Total assist(attempted lateral scoot, pt unable to assist) General transfer comment: cues  for hand placement, assist to rise, steady with therapist's foot under pt's R foot as a cue to avoid weight bearing    Balance   Sitting-balance support: Feet supported Sitting balance-Leahy Scale: Good       Standing balance-Leahy Scale: Poor Standing balance comment: improved ability to maintain NWB on R LE with shoe on L foot, tolerated x 1 minute before fatiguing                           ADL either performed or assessed with clinical judgement   ADL                                               Vision       Perception     Praxis      Cognition Arousal/Alertness: Awake/alert Behavior During Therapy: WFL for tasks assessed/performed Overall Cognitive Status: Within Functional Limits for tasks assessed                                          Exercises     Shoulder Instructions       General Comments      Pertinent Vitals/ Pain       Pain Assessment: Faces Faces Pain Scale: Hurts little more Pain Location: R LE Pain Descriptors / Indicators: Sore Pain Intervention(s): Monitored during session;Repositioned  Home Living  Prior Functioning/Environment              Frequency  Min 2X/week        Progress Toward Goals  OT Goals(current goals can now be found in the care plan section)  Progress towards OT goals: Progressing toward goals  Acute Rehab OT Goals Patient Stated Goal: to return home with family assisting OT Goal Formulation: With patient Time For Goal Achievement: 07/27/19 Potential to Achieve Goals: Good  Plan Discharge plan remains appropriate    Co-evaluation    PT/OT/SLP Co-Evaluation/Treatment: Yes Reason for Co-Treatment: For patient/therapist safety   OT goals addressed during session: ADL's and self-care;Strengthening/ROM;Proper use of Adaptive equipment and DME      AM-PAC OT "6 Clicks" Daily Activity      Outcome Measure   Help from another person eating meals?: None Help from another person taking care of personal grooming?: A Little Help from another person toileting, which includes using toliet, bedpan, or urinal?: Total Help from another person bathing (including washing, rinsing, drying)?: A Lot Help from another person to put on and taking off regular upper body clothing?: A Little Help from another person to put on and taking off regular lower body clothing?: Total 6 Click Score: 14    End of Session Equipment Utilized During Treatment: Gait belt;Rolling walker  OT Visit Diagnosis: Unsteadiness on feet (R26.81);Pain;Muscle weakness (generalized) (M62.81);History of falling (Z91.81)   Activity Tolerance Patient tolerated treatment well   Patient Left in chair;with call bell/phone within reach;with family/visitor present   Nurse Communication Need for lift equipment;Mobility status        Time: 0174-9449 OT Time Calculation (min): 40 min  Charges: OT General Charges $OT Visit: 1 Visit OT Treatments $Self Care/Home Management : 23-37 mins  Nestor Lewandowsky, OTR/L Acute Rehabilitation Services Pager: [phone] Office: [phone]   Malka So 07/15/2019, 11:44 AM

## 2019-07-15 NOTE — Progress Notes (Signed)
Inpatient Rehab Admissions:  Inpatient Rehab Consult received.  I met with patient and her daughter, [name], at the bedside for rehabilitation assessment and to discuss goals and expectations of an inpatient rehab admission.  They are both interested in CIR for rehab and family is available to provide assist at discharge if needed.  I will contact Dr. [place] to see about possibly admitting pt today.   Signed: Shann Medal, PT, DPT Admissions Coordinator [phone] 07/15/19  12:33 PM

## 2019-07-15 NOTE — Progress Notes (Signed)
Physical Therapy Treatment Patient Details Name: Katherine Tyler MRN: [id number] DOB: [birth year] Today's Date: 07/15/2019    History of Present Illness Patient is an 85 year old female admitted after fall at home. Found to have distal tibia/fibula fx. S/P ORIF 07/12/19. PMH to include: HTN, Obesity, Afib, HLD, Gerd, Anxiety, CHF, OA.    PT Comments    Pt making progress with functional mobility with improved ability to maintain NWB R LE in standing. Pt tolerated standing for ~60 seconds with mod A x2 and RW. Pt would continue to benefit from skilled physical therapy services at this time while admitted and after d/c to address the below listed limitations in order to improve overall safety and independence with functional mobility.    Follow Up Recommendations  CIR     Equipment Recommendations  Wheelchair (measurements PT);Wheelchair cushion (measurements PT)    Recommendations for Other Services       Precautions / Restrictions Precautions Precautions: Fall Restrictions Weight Bearing Restrictions: Yes RLE Weight Bearing: Non weight bearing    Mobility  Bed Mobility Overal bed mobility: Needs Assistance Bed Mobility: Supine to Sit;Sit to Supine;Rolling Rolling: Min assist   Supine to sit: Mod assist Sit to supine: +2 for physical assistance;Mod assist   General bed mobility comments: increased time and effort, HOB elevated, use of rails, assistance needed for trunk elevation and for bilateral LE return onto bed  Transfers Overall transfer level: Needs assistance Equipment used: Rolling walker (2 wheeled) Transfers: Sit to/from Stand;Lateral/Scoot Transfers Sit to Stand: +2 physical assistance;Mod assist        Lateral/Scoot Transfers: +2 physical assistance;Total assist General transfer comment: attempted lateral scoot to w/c but pt unsuccessful. pt able to perform sit<>stand from bed with mod A x2 with bed in an elevated position; however, she was unable to hop or pivot  on her L LE; therefore maximove was utilized for transfer OOB to chair  Ambulation/Gait                 Stairs             Wheelchair Mobility    Modified Rankin (Stroke Patients Only)       Balance Overall balance assessment: Needs assistance Sitting-balance support: Feet supported Sitting balance-Leahy Scale: Good     Standing balance support: Bilateral upper extremity supported Standing balance-Leahy Scale: Poor Standing balance comment: improved ability to maintain NWB on R LE with shoe on L foot, tolerated x 1 minute before fatiguing                            Cognition Arousal/Alertness: Awake/alert Behavior During Therapy: WFL for tasks assessed/performed Overall Cognitive Status: Within Functional Limits for tasks assessed                                        Exercises      General Comments        Pertinent Vitals/Pain Pain Assessment: Faces Faces Pain Scale: Hurts little more Pain Location: R LE Pain Descriptors / Indicators: Sore Pain Intervention(s): Monitored during session;Repositioned    Home Living                      Prior Function Level of Independence: Independent with assistive device(s)          PT Goals (current goals  can now be found in the care plan section) Acute Rehab PT Goals Patient Stated Goal: to return home with family assisting Progress towards PT goals: Progressing toward goals    Frequency    Min 3X/week      PT Plan Current plan remains appropriate    Co-evaluation PT/OT/SLP Co-Evaluation/Treatment: Yes Reason for Co-Treatment: To address functional/ADL transfers;For patient/therapist safety PT goals addressed during session: Mobility/safety with mobility;Balance;Proper use of DME;Strengthening/ROM OT goals addressed during session: ADL's and self-care;Strengthening/ROM;Proper use of Adaptive equipment and DME      AM-PAC PT "6 Clicks" Mobility   Outcome  Measure  Help needed turning from your back to your side while in a flat bed without using bedrails?: A Lot Help needed moving from lying on your back to sitting on the side of a flat bed without using bedrails?: A Lot Help needed moving to and from a bed to a chair (including a wheelchair)?: A Lot Help needed standing up from a chair using your arms (e.g., wheelchair or bedside chair)?: A Lot Help needed to walk in hospital room?: Total Help needed climbing 3-5 steps with a railing? : Total 6 Click Score: 10    End of Session Equipment Utilized During Treatment: Gait belt Activity Tolerance: Patient limited by fatigue Patient left: in chair;with call bell/phone within reach;with family/visitor present Nurse Communication: Mobility status;Need for lift equipment PT Visit Diagnosis: Muscle weakness (generalized) (M62.81);Other abnormalities of gait and mobility (R26.89);History of falling (Z91.81);Unsteadiness on feet (R26.81)     Time: [id number] PT Time Calculation (min) (ACUTE ONLY): 44 min  Charges:  $Therapeutic Activity: 8-22 mins                     Deborah ChalkJennifer Mallisa Alameda, PT, DPT  Acute Rehabilitation Services Pager [phone] Office [phone]     Alessandra BevelsJennifer M Harvard [employer] 07/15/2019, 12:54 PM

## 2019-07-15 NOTE — Progress Notes (Signed)
Subjective: 3 Days Post-Op Procedure(s) (LRB): OPEN  AND REDUCTION INTERNAL FIXATION (ORIF) ANKLE FRACTURE (Right) Irrigation And Debridement Extremity (Right)  Patient reports pain as mild to moderate.   Patient tolerating by mouth's well.  Admits to flat [place].  Denies fever, chills, nausea, vomiting, CP, SOB.  Reports that she feels sluggish today.    Objective:   VITALS:  Temp:  [98.2 F (36.8 C)-99.4 F (37.4 C)] 98.4 F (36.9 C) (08/05 0412) Pulse Rate:  [58-69] 68 (08/05 0412) Resp:  [15-18] 15 (08/05 0412) BP: (133-191)/(41-92) 148/92 (08/05 0412) SpO2:  [94 %-99 %] 94 % (08/05 0412)  General: WDWN patient in NAD. Psych:  Appropriate mood and affect. Neuro:  A&O x 3, Moving all extremities, sensation intact to light touch HEENT:  EOMs intact Chest:  Even non-labored respirations Skin:  SLS C/D/I, no rashes or lesions Extremities: warm/dry, mild edema, no erythema or echymosis.  No lymphadenopathy. Pulses: Popliteus 2+ MSK:  ROM: EHL/FHL intact, MMT: able to perform quad set,    LABS Recent Labs    07/12/19 1026 07/13/19 0532 07/14/19 0457 07/14/19 1559 07/15/19 0239  HGB 9.3* 7.7* 6.7* 8.4* 8.2*  WBC 7.4 7.0 7.7  --  7.5  PLT 134* 130* 114*  --  128*   Recent Labs    07/14/19 0457 07/15/19 0239  NA 130* 134*  K 4.6 4.6  CL 97* 99  CO2 25 26  BUN 31* 27*  CREATININE 1.24* 1.21*  GLUCOSE 101* 101*   No results for input(s): LABPT, INR in the last 72 hours.   Assessment/Plan: 3 Days Post-Op Procedure(s) (LRB): OPEN  AND REDUCTION INTERNAL FIXATION (ORIF) ANKLE FRACTURE (Right) Irrigation And Debridement Extremity (Right)  NWB R LE Up with therapy Rx for tramadol on chart Eliquis for DVT prophylaxis Disp: PT recommends SNF vs CIR Plan for outpatient post-op visit with Dr. Doran Durand Ortho signing off Please call with any questions or concerns.  Mechele Claude PA-C [facility] Office:  [phone]

## 2019-07-15 NOTE — H&P (Addendum)
Physical Medicine and Rehabilitation Admission H&P    Chief Complaint  Patient presents with  .  Functional deficits due to fracture   HPI: Katherine Tyler is an 85 year old female with history of HTN, PAF--on Eliquis, obesity, chronic diastolic CHF who was admitted on 07/12/2019 after a fall resulting in right ankle pain.  History taken from chart review and daughter.  On evaluation, she was found to have right open displaced trimalleolar fracture.  She was taken to the OR by Dr. Doran Durand for ORIF right ankle trimalleolar fracture and internal fixation of right ankle syndesmosis disruption.  Postoperatively she is to be nonweightbearing on her right lower extremity.  Her Eliquis was resumed.  Acute blood loss anemia with drop in hemoglobin to 6.7 and required 1 unit of packed red blood cells. Acute on chronic renal failure has improved.  Therapy ongoing and she continues to have difficulty maintaining nonweightbearing status and currently limited by weakness and pain.  CIR recommended due to functional decline.  Please see preadmission assessment from earlier today as well.  Review of Systems  Constitutional: Negative for fever.  HENT: Positive for hearing loss. Negative for tinnitus.   Eyes: Negative for blurred vision and double vision.  Respiratory: Positive for cough. Negative for shortness of breath.   Cardiovascular: Negative for chest pain and palpitations.  Gastrointestinal: Negative for heartburn and nausea.  Genitourinary: Negative for dysuria and urgency.  Musculoskeletal: Positive for joint pain. Negative for myalgias.  Skin: Negative for rash.  Neurological: Positive for weakness. Negative for dizziness, sensory change and headaches.  All other systems reviewed and are negative.  Past Medical History:  Diagnosis Date  . Anxiety   . Arthritis   . Chronic diastolic CHF (congestive heart failure) ([place])   . GERD (gastroesophageal reflux disease)   . Hiatal hernia   .  Hyperkalemia   . Hyperlipidemia   . Hypertension   . Hypothyroidism   . Iron deficiency anemia   . Obesity   . PAF (paroxysmal atrial fibrillation) ([place])    a. diagnosed in 01/2018. Started on Eliquis for anticoagulation.   . Vitamin D deficiency     Past Surgical History:  Procedure Laterality Date  . CHOLECYSTECTOMY    . I&D EXTREMITY Right 07/12/2019   Procedure: Irrigation And Debridement Extremity;  Surgeon: Wylene Simmer, MD;  Location: [facility];  Service: Orthopedics;  Laterality: Right;  . ORIF ANKLE FRACTURE Right 07/12/2019   Procedure: OPEN  AND REDUCTION INTERNAL FIXATION (ORIF) ANKLE FRACTURE;  Surgeon: Wylene Simmer, MD;  Location: [facility];  Service: Orthopedics;  Laterality: Right;  . TUBAL LIGATION  1960    Family History  Problem Relation Age of Onset  . Heart disease Mother   . Stroke Father   . Heart disease Sister   . Heart disease Brother   . Heart disease Sister   . Heart disease Sister   . Heart disease Brother   . Heart disease Brother   . Diabetes Daughter     Social History:  Lives alone. 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  Independent with walker PTA. Daughter helps with home mangement and family checks daily. She reports that she has never smoked. She quit smokeless tobacco use about 62 years ago.  Her smokeless tobacco use included snuff. She reports that she does not drink alcohol or use drugs.     Allergies  Allergen Reactions  . Terazosin Hcl Other (See Comments)    Muscle aches  . Keflex [  Cephalexin] Other (See Comments)    unknown    Medications Prior to Admission  Medication Sig Dispense Refill  . apixaban (ELIQUIS) 5 MG TABS tablet Take 1 tablet (5 mg total) by mouth 2 (two) times daily. 180 tablet 3  . Cholecalciferol (D3-1000) 1000 UNITS capsule Take 1,000 Units by mouth daily.     . citalopram (CELEXA) 20 MG tablet Take 1 tablet by mouth once daily (Patient taking differently: Take 20 mg by mouth daily. ) 90 tablet 0  . diltiazem  (CARDIZEM CD) 120 MG 24 hr capsule Take 1 capsule (120 mg total) by mouth 2 (two) times daily. 180 capsule 3  . Ferrous Sulfate Dried (SLOW RELEASE IRON) 45 MG TBCR Take 45 mg by mouth 2 (two) times daily. 60 tablet 2  . furosemide (LASIX) 20 MG tablet Take 1 tablet (20 mg total) by mouth every other day. 45 tablet 3  . levothyroxine (SYNTHROID, LEVOTHROID) 125 MCG tablet Take 125 mcg by mouth daily before breakfast.    . losartan (COZAAR) 100 MG tablet Take 1 tablet (100 mg total) by mouth daily. 90 tablet 3  . magnesium oxide (MAG-OX) 400 (241.3 MG) MG tablet Take 1 tablet (400 mg total) by mouth daily. 30 tablet 0  . metoprolol succinate (TOPROL XL) 25 MG 24 hr tablet Take 1 tablet (25 mg total) by mouth daily. 90 tablet 3  . nystatin (MYCOSTATIN/NYSTOP) powder Apply topically 4 (four) times daily. 15 g 0  . thiamine (VITAMIN B-1) 100 MG tablet Take 100 mg by mouth daily.      Drug Regimen Review  Drug regimen was reviewed and remains appropriate with no significant issues identified  Home: Home Living Family/patient expects to be discharged to:: Private residence Living Arrangements: Alone, Children Available Help at Discharge: Family, Available 24 hours/day Type of Home: House Home Access: Stairs to enter Secretary/administratorntrance Stairs-Number of Steps: 1+1 Entrance Stairs-Rails: None Home Layout: One level Bathroom Shower/Tub: Health visitorWalk-in shower Bathroom Toilet: Standard Home Equipment: Environmental consultantWalker - 2 wheels, Shower seat - built in, Coventry Health Carerab bars - tub/shower, Hand held shower head, Bedside commode   Functional History: Prior Function Level of Independence: Independent with assistive device(s) Gait / Transfers Assistance Needed: ambulated with RW ADL's / Homemaking Assistance Needed: assist for socks, otherwise modified independent in ADL, meal prep and light housekeeping  Functional Status:  Mobility: Bed Mobility Overal bed mobility: Needs Assistance Bed Mobility: Supine to Sit, Sit to Supine,  Rolling Rolling: Min assist Supine to sit: Mod assist Sit to supine: +2 for physical assistance, Mod assist General bed mobility comments: increased time and effort, HOB elevated, use of rails, assistance needed for trunk elevation and for bilateral LE return onto bed Transfers Overall transfer level: Needs assistance Equipment used: Rolling walker (2 wheeled) Transfer via Lift Equipment: Stedy Transfers: Sit to/from Stand, Nurse, learning disabilityLateral/Scoot Transfers Sit to Stand: +2 physical assistance, Mod assist Stand pivot transfers: +2 physical assistance, +2 safety/equipment, From elevated surface, Max assist Squat pivot transfers: +2 physical assistance, +2 safety/equipment, Max assist  Lateral/Scoot Transfers: +2 physical assistance, Total assist General transfer comment: attempted lateral scoot to w/c but pt unsuccessful. pt able to perform sit<>stand from bed with mod A x2 with bed in an elevated position; however, she was unable to hop or pivot on her L LE; therefore maximove was utilized for transfer OOB to chair Ambulation/Gait General Gait Details: unable at this time    ADL: ADL Overall ADL's : Needs assistance/impaired Eating/Feeding: Independent Grooming: Set up, Sitting Upper Body  Bathing: Set up, Sitting Lower Body Bathing: Total assistance, Bed level Upper Body Dressing : Minimal assistance, Sitting Lower Body Dressing: Total assistance, Bed level Toilet Transfer: +2 for physical assistance, Total assistance, Squat-pivot Toileting- Clothing Manipulation and Hygiene: Total assistance, Bed level  Cognition: Cognition Overall Cognitive Status: Within Functional Limits for tasks assessed Orientation Level: Oriented X4 Cognition Arousal/Alertness: Awake/alert Behavior During Therapy: WFL for tasks assessed/performed Overall Cognitive Status: Within Functional Limits for tasks assessed  Physical Exam: Blood pressure (!) 148/92, pulse 68, temperature 98.4 F (36.9 C), temperature  source Oral, resp. rate 15, height 5\' 3"  (1.6 m), weight 110.2 kg, SpO2 94 %. Physical Exam  Nursing note and vitals reviewed. Constitutional: She is oriented to person, place, and time. She appears well-developed. No distress.  Morbidly obese  HENT:  Head: Normocephalic and atraumatic.  Eyes: EOM are normal. Right eye exhibits no discharge. Left eye exhibits no discharge.  Respiratory: Effort normal. No stridor. No respiratory distress.  GI: She exhibits no distension.  Musculoskeletal:        General: Tenderness and edema present.     Comments: Right ankle with ace wrap  Neurological: She is alert and oriented to person, place, and time.  HOH   Motor: Bilateral upper extremities: 4-/5 proximal distal Right lower extremity: Hip flexion, knee extension 3+/5, wiggles toes Sensation intact light touch Left lower extremity: 4 medicine/5 proximal distal  Skin: She is not diaphoretic.  Right lower extremity with dressing C/D/  Psychiatric:  Pleasantly confused    Results for orders placed or performed during the hospital encounter of 07/12/19 (from the past 48 hour(s))  CBC     Status: Abnormal   Collection Time: 07/14/19  4:57 AM  Result Value Ref Range   WBC 7.7 4.0 - 10.5 K/uL   RBC 2.07 (L) 3.87 - 5.11 MIL/uL   Hemoglobin 6.7 (LL) 12.0 - 15.0 g/dL    Comment: REPEATED TO VERIFY THIS CRITICAL RESULT HAS VERIFIED AND BEEN CALLED TO R.SNEIDERMAN,RN BY MELISSA BROGDON ON 08 04 2020 AT 0553, AND HAS BEEN READ BACK.     HCT 20.9 (L) 36.0 - 46.0 %   MCV 101.0 (H) 80.0 - 100.0 fL   MCH 32.4 26.0 - 34.0 pg   MCHC 32.1 30.0 - 36.0 g/dL   RDW 16.115.1 09.611.5 - 04.515.5 %   Platelets 114 (L) 150 - 400 K/uL    Comment: REPEATED TO VERIFY PLATELET COUNT CONFIRMED BY SMEAR SPECIMEN CHECKED FOR CLOTS Immature Platelet Fraction may be clinically indicated, consider ordering this additional test [id number]    nRBC 0.0 0.0 - 0.2 %    Comment: Performed at [facility], [address]  [place], [place] [address]  Basic metabolic panel     Status: Abnormal   Collection Time: 07/14/19  4:57 AM  Result Value Ref Range   Sodium 130 (L) 135 - 145 mmol/L   Potassium 4.6 3.5 - 5.1 mmol/L   Chloride 97 (L) 98 - 111 mmol/L   CO2 25 22 - 32 mmol/L   Glucose, Bld 101 (H) 70 - 99 mg/dL   BUN 31 (H) 8 - 23 mg/dL   Creatinine, Ser 5.621.24 (H) 0.44 - 1.00 mg/dL   Calcium 8.6 (L) 8.9 - 10.3 mg/dL   GFR calc non Af Amer 39 (L) >60 mL/min   GFR calc Af Amer 45 (L) >60 mL/min   Anion gap 8 5 - 15    Comment: Performed at [facility], [address] [place], [place] [address]  Magnesium     Status: None   Collection Time: 07/14/19  4:57 AM  Result Value Ref Range   Magnesium 2.3 1.7 - 2.4 mg/dL    Comment: Performed at [facility], [place] [address] [place], [place] [address]  Type and screen Bath     Status: None   Collection Time: 07/14/19  7:15 AM  Result Value Ref Range   ABO/RH(D) A POS    Antibody Screen NEG    Sample Expiration 07/17/2019,2359    Unit Number [id number]    Blood Component Type RBC LR PHER1    Unit division 00    Status of Unit ISSUED,FINAL    Transfusion Status OK TO TRANSFUSE    Crossmatch Result      Compatible Performed at [facility], [place] [address], [place], [place] [address]   Prepare RBC     Status: None   Collection Time: 07/14/19  7:15 AM  Result Value Ref Range   Order Confirmation      ORDER PROCESSED BY BLOOD BANK Performed at [facility], [place] [address], [place], [place] [address]   ABO/Rh     Status: None   Collection Time: 07/14/19  7:15 AM  Result Value Ref Range   ABO/RH(D)      A POS Performed at [facility] [address] [place], [place] [address]   Hemoglobin and hematocrit, blood     Status: Abnormal   Collection Time: 07/14/19  3:59 PM  Result Value Ref Range   Hemoglobin 8.4 (L) 12.0 - 15.0 g/dL    Comment: REPEATED TO VERIFY POST TRANSFUSION SPECIMEN    HCT  26.2 (L) 36.0 - 46.0 %    Comment: Performed at [facility] [address] [place], [place] [address]  CBC     Status: Abnormal   Collection Time: 07/15/19  2:39 AM  Result Value Ref Range   WBC 7.5 4.0 - 10.5 K/uL   RBC 2.57 (L) 3.87 - 5.11 MIL/uL   Hemoglobin 8.2 (L) 12.0 - 15.0 g/dL   HCT 25.7 (L) 36.0 - 46.0 %   MCV 100.0 80.0 - 100.0 fL   MCH 31.9 26.0 - 34.0 pg   MCHC 31.9 30.0 - 36.0 g/dL   RDW 16.3 (H) 11.5 - 15.5 %   Platelets 128 (L) 150 - 400 K/uL   nRBC 0.0 0.0 - 0.2 %    Comment: Performed at [facility], [place] [address] [place], [place] [address]  Basic metabolic panel     Status: Abnormal   Collection Time: 07/15/19  2:39 AM  Result Value Ref Range   Sodium 134 (L) 135 - 145 mmol/L   Potassium 4.6 3.5 - 5.1 mmol/L   Chloride 99 98 - 111 mmol/L   CO2 26 22 - 32 mmol/L   Glucose, Bld 101 (H) 70 - 99 mg/dL   BUN 27 (H) 8 - 23 mg/dL   Creatinine, Ser 1.21 (H) 0.44 - 1.00 mg/dL   Calcium 8.8 (L) 8.9 - 10.3 mg/dL   GFR calc non Af Amer 40 (L) >60 mL/min   GFR calc Af Amer 46 (L) >60 mL/min   Anion gap 9 5 - 15    Comment: Performed at [facility] [address], [place], [place] [address]   No results found.     Medical Problem List and Plan:  1.  Deficits with mobility, transfers, self-care secondary to right ankle fracture.  Admit to CIR 2.  Antithrombotics: -DVT/anticoagulation:  Pharmaceutical: Other (comment) Eliquis  -antiplatelet therapy: N/A 3. Pain Management: Continue oxycodone as needed 4. Mood: Team to provide ego support for chronic anxiety.  LCSW to follow for evaluation and support  -antipsychotic agents: N/A 5. Neuropsych: This patient is not fully capable of making decisions on her own behalf. 6. Skin/Wound Care: Monitor incision for healing daily.  Routine pressure relief measures 7. Fluids/Electrolytes/Nutrition: Monitor I's and O's.  Offer supplements as needed if intake poor. 8.  Acute on chronic renal failure: Has  greatly improved with IV fluids--DC due to history of CHF.   Labs ordered for tomorrow a.m.  9. Iron deficiency anemia with ABLA: Continue iron supplement  Labs ordered for tomorrow a.m. 10. HTN: Monitor blood pressures and watch for signs of orthostasis.  Continue losartan, diltiazem, metoprolol, and furosemide daily.  Monitor with increased mobility 11. Chronic diastolic CHF: Discontinue IV fluids. Ccontinue losartan, metoprolol, furosemide.  Daily weights 12.  PAF: Monitor heart rate TID.  Continue diltiazem and metoprolol.  Monitor with increased activity 13.  Anxiety disorder: Continue Celexa daily  Post Admission Physician Evaluation: 1. Preadmission assessment reviewed and changes made below. 2. Functional deficits secondary to right ankle fracture. 3. Patient is admitted to receive collaborative, interdisciplinary care between the physiatrist, rehab nursing staff, and therapy team. 4. Patient has experienced substantial functional loss from his/her baseline which was documented above under the "Functional History" and "Functional Status" headings.  Judging by the patient's diagnosis, physical exam, and functional history, the patient has potential for functional progress which will result in measurable gains while on inpatient rehab.  These gains will be of substantial and practical use upon discharge  in facilitating mobility and self-care at the household level. 5. Physiatrist will provide 24 hour management of medical needs as well as oversight of the therapy plan/treatment and provide guidance as appropriate regarding the interaction of the two. 6. 24 hour rehab nursing will assist with bladder management, bowel management, safety, skin/wound care, disease management, pain management and patient education  and help integrate therapy concepts, techniques,education, etc. 7. PT will assess and treat for/with: Lower extremity strength, range of motion, stamina, balance, functional  mobility, safety, adaptive techniques and equipment, wound care, coping skills, pain control, education. Goals are: Min A. 8. OT will assess and treat for/with: ADL's, functional mobility, safety, upper extremity strength, adaptive techniques and equipment, wound mgt, ego support, and community reintegration.   Goals are: Min/mod a. Therapy may not proceed with showering this patient. 9. Case Management and Social Worker will assess and treat for psychological issues and discharge planning. 10. Team conference will be held weekly to assess progress toward goals and to determine barriers to discharge. 11. Patient will receive at least 3 hours of therapy per day at least 5 days per week. 12. ELOS: 14-18 days.       13. Prognosis:  good  I have personally performed a face to face diagnostic evaluation, including, but not limited to relevant history and physical exam findings, of this patient and developed relevant assessment and plan.  Additionally, I have reviewed and concur with the physician assistant's documentation above.  Maryla MorrowAnkit Patel, MD, ABPMR Jacquelynn CreePamela S Love, PA-C 07/15/2019

## 2019-07-16 ENCOUNTER — Inpatient Hospital Stay (HOSPITAL_COMMUNITY): Payer: Medicare Other | Admitting: Occupational Therapy

## 2019-07-16 ENCOUNTER — Inpatient Hospital Stay (HOSPITAL_COMMUNITY): Payer: Medicare Other

## 2019-07-16 DIAGNOSIS — S82201S Unspecified fracture of shaft of right tibia, sequela: Secondary | ICD-10-CM

## 2019-07-16 DIAGNOSIS — I10 Essential (primary) hypertension: Secondary | ICD-10-CM

## 2019-07-16 DIAGNOSIS — I5032 Chronic diastolic (congestive) heart failure: Secondary | ICD-10-CM

## 2019-07-16 DIAGNOSIS — S82401S Unspecified fracture of shaft of right fibula, sequela: Secondary | ICD-10-CM

## 2019-07-16 DIAGNOSIS — E871 Hypo-osmolality and hyponatremia: Secondary | ICD-10-CM

## 2019-07-16 LAB — CBC WITH DIFFERENTIAL/PLATELET
Abs Immature Granulocytes: 0.03 10*3/uL (ref 0.00–0.07)
Basophils Absolute: 0 10*3/uL (ref 0.0–0.1)
Basophils Relative: 0 %
Eosinophils Absolute: 0 10*3/uL (ref 0.0–0.5)
Eosinophils Relative: 0 %
HCT: 30 % — ABNORMAL LOW (ref 36.0–46.0)
Hemoglobin: 9.9 g/dL — ABNORMAL LOW (ref 12.0–15.0)
Immature Granulocytes: 0 %
Lymphocytes Relative: 5 %
Lymphs Abs: 0.5 10*3/uL — ABNORMAL LOW (ref 0.7–4.0)
MCH: 32.4 pg (ref 26.0–34.0)
MCHC: 33 g/dL (ref 30.0–36.0)
MCV: 98 fL (ref 80.0–100.0)
Monocytes Absolute: 0.6 10*3/uL (ref 0.1–1.0)
Monocytes Relative: 5 %
Neutro Abs: 9.8 10*3/uL — ABNORMAL HIGH (ref 1.7–7.7)
Neutrophils Relative %: 90 %
Platelets: 192 10*3/uL (ref 150–400)
RBC: 3.06 MIL/uL — ABNORMAL LOW (ref 3.87–5.11)
RDW: 15.6 % — ABNORMAL HIGH (ref 11.5–15.5)
WBC: 10.9 10*3/uL — ABNORMAL HIGH (ref 4.0–10.5)
nRBC: 0 % (ref 0.0–0.2)

## 2019-07-16 LAB — COMPREHENSIVE METABOLIC PANEL
ALT: 19 U/L (ref 0–44)
AST: 28 U/L (ref 15–41)
Albumin: 2.8 g/dL — ABNORMAL LOW (ref 3.5–5.0)
Alkaline Phosphatase: 47 U/L (ref 38–126)
Anion gap: 12 (ref 5–15)
BUN: 22 mg/dL (ref 8–23)
CO2: 26 mmol/L (ref 22–32)
Calcium: 8.9 mg/dL (ref 8.9–10.3)
Chloride: 95 mmol/L — ABNORMAL LOW (ref 98–111)
Creatinine, Ser: 1.19 mg/dL — ABNORMAL HIGH (ref 0.44–1.00)
GFR calc Af Amer: 47 mL/min — ABNORMAL LOW (ref >60)
GFR calc non Af Amer: 41 mL/min — ABNORMAL LOW (ref >60)
Glucose, Bld: 138 mg/dL — ABNORMAL HIGH (ref 70–99)
Potassium: 4.1 mmol/L (ref 3.5–5.1)
Sodium: 133 mmol/L — ABNORMAL LOW (ref 135–145)
Total Bilirubin: 0.8 mg/dL (ref 0.3–1.2)
Total Protein: 5.8 g/dL — ABNORMAL LOW (ref 6.5–8.1)

## 2019-07-16 MED ORDER — FLEET ENEMA 7-19 GM/118ML RE ENEM
1.0000 | ENEMA | Freq: Once | RECTAL | Status: AC
  Administered 2019-07-16: 18:00:00 1 via RECTAL
  Filled 2019-07-16: qty 1

## 2019-07-16 MED ORDER — PRO-STAT SUGAR FREE PO LIQD
30.0000 mL | Freq: Two times a day (BID) | ORAL | Status: DC
  Administered 2019-07-16 – 2019-08-05 (×37): 30 mL via ORAL
  Filled 2019-07-16 (×38): qty 30

## 2019-07-16 NOTE — Progress Notes (Signed)
Occupational Therapy Session Note  Patient Details  Name: Katherine Tyler MRN: [id number] Date of Birth: 06-23-1929  Today's Date: 07/16/2019 OT Individual Time: 1300-1340 OT Individual Time Calculation (min): 40 min    Short Term Goals: Week 1:  OT Short Term Goal 1 (Week 1): Pt will consistently transfer with +1 assist using LRAD while maintaining WBing pre-cautions OT Short Term Goal 2 (Week 1): Pt will don pants with mod A using AE PRN OT Short Term Goal 3 (Week 1): Pt will consistently complete sit>stand with no more than mod A using LRAD while maintaining WBing pre-cautions during functional task.  Skilled Therapeutic Interventions/Progress Updates:  Pt resting in bed upon arrival awaiting lunch.  Pt states that her RLE is not hurting presently but throbs when sitting up or standing.  OT intervention with focus on bed mobility and attempted sit<>stand.  No +2 available-sit<>stand not attempted.  Pt c/o increased nausea and dizziness with movement.  Pt states became nauseous with sitting EOB. Pt returned to supine.  Checked with RN about lunch.  Lunch tray ordered. Pt remained in bed with all needs within reach and bed alarm activated.   Therapy Documentation Precautions:  Precautions Precautions: Fall Restrictions Weight Bearing Restrictions: Yes RLE Weight Bearing: Non weight bearing Pain:  Pt denies pain this afternoon while supine but c/o "throbbing" in RLE with activity; repositioned   Therapy/Group: Individual Therapy  Leroy Libman 07/16/2019, 2:53 PM

## 2019-07-16 NOTE — Progress Notes (Signed)
Patient awoke from sleep nauseas and began to vomit. This nurse gave her 10mg  of compazine. Left emesis bag at bedside, head of bed elevated and call bell at reach.

## 2019-07-16 NOTE — Progress Notes (Signed)
07/15/2019 1656: Pt arrives to inpatient rehab unit at 1650. Pt is accompanied by daughter, [name], upon arrival. Pt and daughter oriented to unit protocol.

## 2019-07-16 NOTE — Evaluation (Signed)
Occupational Therapy Assessment and Plan  Patient Details  Name: Katherine Tyler MRN: [id number] Date of Birth: [birth year]  OT Diagnosis: acute pain and muscle weakness (generalized) Rehab Potential: Rehab Potential (ACUTE ONLY): Good ELOS: 2.5-3 weeks   Today's Date: 07/16/2019 OT Individual Time: 8101-7510 OT Individual Time Calculation (min): 75 min     Problem List:  Patient Active Problem List   Diagnosis Date Noted  . Closed fracture of right fibula and tibia, sequela 07/15/2019  . Acute blood loss anemia   . AKI (acute kidney injury) ([place])   . Fracture of tibial shaft, open 07/12/2019  . PAF (paroxysmal atrial fibrillation) ([place])   . Obesity   . Hypertension   . Hyperlipidemia   . GERD (gastroesophageal reflux disease)   . Hiatal hernia   . Arthritis   . Anxiety   . Chronic diastolic CHF (congestive heart failure) ([place])   . Current use of long term anticoagulation 01/31/2018  . Chronic diastolic heart failure ([place]) 01/31/2018  . A-fib ([place]) 01/16/2018  . CHF (congestive heart failure) ([place]) 01/14/2018  . Hyperkalemia 01/14/2018  . Iron deficiency anemia 10/01/2017  . Osteoarthritis 09/24/2016  . Essential hypertension   . Hyponatremia 06/18/2016  . Hepatic cirrhosis ([place]) 06/18/2016  . Hypokalemia 06/18/2016  . Metabolic syndrome 25/85/2778  . Obesity (BMI 30-39.9) 09/12/2015  . GAD (generalized anxiety disorder) 05/03/2015  . Varicose veins 06/24/2013  . Seasonal allergic rhinitis 04/30/2013  . Vitamin D deficiency 04/16/2013  . Hypothyroidism 04/16/2013  . Genu valgum (acquired) 04/16/2013    Past Medical History:  Past Medical History:  Diagnosis Date  . Anxiety   . Arthritis   . Chronic diastolic CHF (congestive heart failure) ([place])   . GERD (gastroesophageal reflux disease)   . Hiatal hernia   . Hyperkalemia   . Hyperlipidemia   . Hypertension   . Hypothyroidism   . Iron deficiency anemia   . Obesity   . PAF (paroxysmal atrial fibrillation) ([place])     a. diagnosed in 01/2018. Started on Eliquis for anticoagulation.   . Vitamin D deficiency    Past Surgical History:  Past Surgical History:  Procedure Laterality Date  . CHOLECYSTECTOMY    . I&D EXTREMITY Right 07/12/2019   Procedure: Irrigation And Debridement Extremity;  Surgeon: Wylene Simmer, MD;  Location: [facility];  Service: Orthopedics;  Laterality: Right;  . ORIF ANKLE FRACTURE Right 07/12/2019   Procedure: OPEN  AND REDUCTION INTERNAL FIXATION (ORIF) ANKLE FRACTURE;  Surgeon: Wylene Simmer, MD;  Location: [facility];  Service: Orthopedics;  Laterality: Right;  . TUBAL LIGATION  1960    Assessment & Plan Clinical Impression: Katherine Tyler is an 85 year old female with history of HTN, PAF--on Eliquis, obesity, chronic diastolic CHF who was admitted on 07/12/2019 after a fall resulting in right ankle pain.  History taken from chart review and daughter.  On evaluation, she was found to have right open displaced trimalleolar fracture.  She was taken to the OR by Dr. Doran Durand for ORIF right ankle trimalleolar fracture and internal fixation of right ankle syndesmosis disruption.  Postoperatively she is to be nonweightbearing on her right lower extremity.  Her Eliquis was resumed.  Acute blood loss anemia with drop in hemoglobin to 6.7 and required 1 unit of packed red blood cells. Acute on chronic renal failure has improved.  Therapy ongoing and she continues to have difficulty maintaining nonweightbearing status and currently limited by weakness and pain.  CIR recommended due to functional decline.  Please see  preadmission assessment from earlier today as well. Patient transferred to CIR on 07/15/2019 .    Patient currently requires total with basic self-care skills secondary to muscle weakness, decreased cardiorespiratoy endurance and decreased standing balance, decreased postural control, decreased balance strategies and difficulty maintaining precautions.  Prior to hospitalization, patient could complete  ADLs/IADLs with modified independent .  Patient will benefit from skilled intervention to decrease level of assist with basic self-care skills and increase independence with basic self-care skills prior to discharge home with care partner.  Anticipate patient will require minimal physical assistance and follow up home health.  OT - End of Session Activity Tolerance: Tolerates 10 - 20 min activity with multiple rests Endurance Deficit: Yes Endurance Deficit Description: Requires rest breaks throughout w/c level ADLs OT Assessment Rehab Potential (ACUTE ONLY): Good OT Barriers to Discharge: Weight bearing restrictions OT Barriers to Discharge Comments: Pt has ddificulty maintaining R NWB OT Patient demonstrates impairments in the following area(s): Balance;Safety;Endurance;Pain OT Basic ADL's Functional Problem(s): Grooming;Bathing;Dressing;Toileting OT Transfers Functional Problem(s): Toilet;Tub/Shower OT Additional Impairment(s): None OT Plan OT Intensity: Minimum of 1-2 x/day, 45 to 90 minutes OT Frequency: 5 out of 7 days OT Duration/Estimated Length of Stay: 2.5-3 weeks OT Treatment/Interventions: Balance/vestibular training;Discharge planning;Pain management;Self Care/advanced ADL retraining;Therapeutic Activities;UE/LE Coordination activities;Therapeutic Exercise;Patient/family education;Functional mobility training;DME/adaptive equipment instruction;Psychosocial support;Splinting/orthotics;UE/LE Strength taining/ROM;Wheelchair propulsion/positioning OT Self Feeding Anticipated Outcome(s): Indep OT Basic Self-Care Anticipated Outcome(s): Min A OT Toileting Anticipated Outcome(s): Mod A OT Bathroom Transfers Anticipated Outcome(s): Min A OT Recommendation Recommendations for Other Services: Therapeutic Recreation consult Therapeutic Recreation Interventions: Kitchen group;Stress management Patient destination: Home Follow Up Recommendations: Home health OT;24 hour  supervision/assistance Equipment Recommended: To be determined   Skilled Therapeutic Intervention Pt seen for OT Eval and ADL bathing/dressing session. Pt awake in supine upon arrival, agreeable to tx session. Complaints of stomach ache/nausea only. RN present and administering medication upon therapist's arrival. Pt provided with Coke and repositioned throughout session. No complaints of LE pain during session. She transferred to sitting EOB with max A using hospital bed functions and use of chuck pad to advance hips towards EOB. Attempted to stand with RW from elevated EOB, however, unable to complete while maintaining NWBing pre-cautions. Used sliding board with total A +1 EOB>w/c.  Completed bathing/dressing from w/c level at sink. Pt able to wash UB with set-up/supervision, +2 for LB when standing to complete buttock hygiene and total A for lower legs.  She donned pull over gown and stood with mod-max A +2 at sink in order for dress to be pulled down. Pt required total A +2 for reciprocal scooting back into w/c.  Pt left sitting in w/c at end of session, R LE elevated, chair belt alarm on and all needs in reach. Education provided throughout session regarding role of OT, CIR, POC, WBing pre-cautions and d/c planning.   OT Evaluation Precautions/Restrictions  Precautions Precautions: Fall Restrictions Weight Bearing Restrictions: Yes RLE Weight Bearing: Non weight bearing General Chart Reviewed: Yes Home Living/Prior Functioning Home Living Available Help at Discharge: Family, Available 24 hours/day(Repotrs family will piece together 24hr care) Type of Home: House Home Access: Stairs to enter [employer] of Steps: 1+1 Entrance Stairs-Rails: None Home Layout: One level Bathroom Shower/Tub: Multimedia programmer: Programmer, systems: Yes  Lives With: Alone IADL History Homemaking Responsibilities: Yes Current License: No 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 Children assisted with all other IADLs Prior Function Level of Independence: Requires assistive device for independence, Needs assistance  with homemaking Driving: No Vocation: Retired Leisure: Hobbies-yes (Comment) Vision Baseline Vision/History: Wears glasses Wears Glasses: At all times Patient Visual Report: No change from baseline Vision Assessment?: No apparent visual deficits Perception  Perception: Within Functional Limits Praxis Praxis: Intact Cognition Overall Cognitive Status: No family/caregiver present to determine baseline cognitive functioning Arousal/Alertness: Awake/alert Orientation Level: Person;Place;Situation Person: Oriented Place: Oriented Situation: Oriented Year: Other (Comment)(1980) Month: July Day of Week: Incorrect Memory: Impaired Memory Impairment: Decreased short term memory;Decreased recall of new information Decreased Short Term Memory: Verbal basic;Functional basic Immediate Memory Recall: Sock;Blue;Bed Memory Recall Sock: Not able to recall Memory Recall Blue: Without Cue Memory Recall Bed: (Not able to recall) Awareness: Impaired Awareness Impairment: Emergent impairment Problem Solving: Appears intact Safety/Judgment: Impaired Comments: Unable to maintain NWBing pre-cautions Sensation Sensation Light Touch: Appears Intact Proprioception: Appears Intact Coordination Gross Motor Movements are Fluid and Coordinated: No Fine Motor Movements are Fluid and Coordinated: Yes Coordination and Movement Description: Impaired 2/2 R LE NWBing and generalized weakness Motor  Motor Motor: Other (comment) Motor - Skilled Clinical Observations: Generalized weakness and impaired 2/2 NWBing pre-cautions Trunk/Postural Assessment  Cervical Assessment Cervical Assessment: Exceptions to WFL(Forward head) Thoracic Assessment Thoracic Assessment: Exceptions to WFL(Kyphotic; Rounded shoulders) Lumbar  Assessment Lumbar Assessment: Exceptions to WFL(Posterior pelvic tilt) Postural Control Postural Control: Deficits on evaluation  Balance Balance Balance Assessed: Yes Static Sitting Balance Static Sitting - Balance Support: Feet supported;No upper extremity supported Static Sitting - Level of Assistance: 5: Stand by assistance;6: Modified independent (Device/Increase time) Static Sitting - Comment/# of Minutes: Sitting EOB Dynamic Sitting Balance Dynamic Sitting - Balance Support: Feet supported;During functional activity Dynamic Sitting - Level of Assistance: 4: Min assist Static Standing Balance Static Standing - Balance Support: Bilateral upper extremity supported Static Standing - Level of Assistance: 1: +2 Total assist Static Standing - Comment/# of Minutes: Standing at sink Dynamic Standing Balance Dynamic Standing - Balance Support: During functional activity;Bilateral upper extremity supported Dynamic Standing - Level of Assistance: 1: +2 Total assist Dynamic Standing - Comments: Standing at sink Extremity/Trunk Assessment RUE Assessment RUE Assessment: Within Functional Limits General Strength Comments: 4-/5 throughout LUE Assessment General Strength Comments: 4-/5 throughout     Refer to Care Plan for Long Term Goals  Recommendations for other services: Therapeutic Recreation  Stress management   Discharge Criteria: Patient will be discharged from OT if patient refuses treatment 3 consecutive times without medical reason, if treatment goals not met, if there is a change in medical status, if patient makes no progress towards goals or if patient is discharged from hospital.  The above assessment, treatment plan, treatment alternatives and goals were discussed and mutually agreed upon: by patient  Laquasha Groome L 07/16/2019, 12:46 PM

## 2019-07-16 NOTE — Evaluation (Signed)
Physical Therapy Assessment and Plan  Patient Details  Name: Katherine Tyler MRN: [id number] Date of Birth: June 23, 1929  PT Diagnosis: Abnormal posture, Abnormality of gait, Cognitive deficits, Difficulty walking, Muscle weakness, Pain in joint and Pain in R LE Rehab Potential: Good ELOS: 2.5-3 weeks   Today's Date: 07/16/2019 PT Individual Time: 1100-1205 PT Individual Time Calculation (min): 65 min    Problem List:  Patient Active Problem List   Diagnosis Date Noted  . Closed fracture of right fibula and tibia, sequela 07/15/2019  . Acute blood loss anemia   . AKI (acute kidney injury) ([place])   . Fracture of tibial shaft, open 07/12/2019  . PAF (paroxysmal atrial fibrillation) ([place])   . Obesity   . Hypertension   . Hyperlipidemia   . GERD (gastroesophageal reflux disease)   . Hiatal hernia   . Arthritis   . Anxiety   . Chronic diastolic CHF (congestive heart failure) ([place])   . Current use of long term anticoagulation 01/31/2018  . Chronic diastolic heart failure ([place]) 01/31/2018  . A-fib ([place]) 01/16/2018  . CHF (congestive heart failure) ([place]) 01/14/2018  . Hyperkalemia 01/14/2018  . Iron deficiency anemia 10/01/2017  . Osteoarthritis 09/24/2016  . Essential hypertension   . Hyponatremia 06/18/2016  . Hepatic cirrhosis ([place]) 06/18/2016  . Hypokalemia 06/18/2016  . Metabolic syndrome 41/63/8453  . Obesity (BMI 30-39.9) 09/12/2015  . GAD (generalized anxiety disorder) 05/03/2015  . Varicose veins 06/24/2013  . Seasonal allergic rhinitis 04/30/2013  . Vitamin D deficiency 04/16/2013  . Hypothyroidism 04/16/2013  . Genu valgum (acquired) 04/16/2013    Past Medical History:  Past Medical History:  Diagnosis Date  . Anxiety   . Arthritis   . Chronic diastolic CHF (congestive heart failure) ([place])   . GERD (gastroesophageal reflux disease)   . Hiatal hernia   . Hyperkalemia   . Hyperlipidemia   . Hypertension   . Hypothyroidism   . Iron deficiency anemia   . Obesity    . PAF (paroxysmal atrial fibrillation) ([place])    a. diagnosed in 01/2018. Started on Eliquis for anticoagulation.   . Vitamin D deficiency    Past Surgical History:  Past Surgical History:  Procedure Laterality Date  . CHOLECYSTECTOMY    . I&D EXTREMITY Right 07/12/2019   Procedure: Irrigation And Debridement Extremity;  Surgeon: Wylene Simmer, MD;  Location: [facility];  Service: Orthopedics;  Laterality: Right;  . ORIF ANKLE FRACTURE Right 07/12/2019   Procedure: OPEN  AND REDUCTION INTERNAL FIXATION (ORIF) ANKLE FRACTURE;  Surgeon: Wylene Simmer, MD;  Location: [facility];  Service: Orthopedics;  Laterality: Right;  . TUBAL LIGATION  1960    Assessment & Plan Clinical Impression: Patient is a 85 y.o. year old female  with history of HTN, PAF--on Eliquis, obesity, chronic diastolic CHF who was admitted on 07/12/2019 after a fall resulting in right ankle pain.  History taken from chart review and daughter.  On evaluation, she was found to have right open displaced trimalleolar fracture.  She was taken to the OR by Dr. Doran Durand for ORIF right ankle trimalleolar fracture and internal fixation of right ankle syndesmosis disruption.  Postoperatively she is to be nonweightbearing on her right lower extremity.  Her Eliquis was resumed.  Acute blood loss anemia with drop in hemoglobin to 6.7 and required 1 unit of packed red blood cells. Acute on chronic renal failure has improved.  Therapy ongoing and she continues to have difficulty maintaining nonweightbearing status and currently limited by weakness and pain.  CIR recommended due to functional decline.  Please see preadmission assessment from earlier today as well.   Patient transferred to CIR on 07/15/2019 .   Patient currently requires mod A +2 with mobility secondary to muscle weakness and muscle joint tightness, decreased cardiorespiratoy endurance, decreased safety awareness and decreased memory and decreased sitting balance, decreased standing balance, decreased  postural control, decreased balance strategies and difficulty maintaining precautions.  Prior to hospitalization, patient was modified independent  with mobility and lived with Alone in a House home.  Home access is 1+1Stairs to enter.  Patient will benefit from skilled PT intervention to maximize safe functional mobility, minimize fall risk and decrease caregiver burden for planned discharge home with 24 hour assist.  Anticipate patient will benefit from follow up [facility] at discharge.  PT - End of Session Activity Tolerance: Tolerates 30+ min activity with multiple rests Endurance Deficit: Yes Endurance Deficit Description: Required frequent rest breaks with SOB with minimal activity PT Assessment Rehab Potential (ACUTE/IP ONLY): Good PT Barriers to Discharge: Home environment access/layout;Weight bearing restrictions PT Barriers to Discharge Comments: 2 level home and STE, may have a ramp built by d/c. Patient has difficulty maintaining R LE NWB precautions PT Patient demonstrates impairments in the following area(s): Balance;Behavior;Edema;Endurance;Motor;Nutrition;Pain;Perception;Safety;Sensory;Skin Integrity PT Transfers Functional Problem(s): Bed Mobility;Bed to Chair;Car;Furniture;Floor PT Locomotion Functional Problem(s): Ambulation;Wheelchair Mobility;Stairs PT Plan PT Intensity: Minimum of 1-2 x/day ,45 to 90 minutes PT Frequency: 5 out of 7 days PT Duration Estimated Length of Stay: 2.5-3 weeks PT Treatment/Interventions: Ambulation/gait training;Discharge planning;Functional mobility training;Psychosocial support;Therapeutic Activities;Balance/vestibular training;Disease management/prevention;Neuromuscular re-education;Skin care/wound management;Therapeutic Exercise;Wheelchair propulsion/positioning;Cognitive remediation/compensation;DME/adaptive equipment instruction;Pain management;Splinting/orthotics;UE/LE Strength taining/ROM;Community reintegration;Functional electrical  stimulation;Patient/family education;Stair training;UE/LE Coordination activities PT Transfers Anticipated Outcome(s): min A PT Locomotion Anticipated Outcome(s): set-up assist at w/c level PT Recommendation Recommendations for Other Services: Neuropsych consult Follow Up Recommendations: Home health PT Patient destination: Home Equipment Recommended: Wheelchair (measurements);To be determined Equipment Details: 22"x18" w/c with standard cushion and R ELR, patient has RW, may need bariatric RW  Skilled Therapeutic Intervention In addition to the PT evaluation below, the patient performed the following skilled PT interventions Patient in bed upon PT arrival. Patient alert and agreeable to PT session.  Therapeutic Activity: Transfers: Patient performed a slide board transfer x1, sit to/from stand x1 with the RW and at the sink with mod A +2. She was unable to perform a stand pivot x1, but later did perform a stand pivot back to bed from the w/c with mod-max A of 2 people and poor control on the descent. PT placed a foot under the patient's R heel placing the patient with her R leg extended out in front of her during all transfers to promote NWB on R LE due to patient not complying with WB precautions on initial attempts at mobility. Provided verbal cues as described below.  Wheelchair Mobility:  Patient propelled wheelchair as described below. Provided cues for proper technique for propulsion and turning. Required total A with management of R ELR and L standard leg rest and breaks throughout session.   Patient in bed at end of session with breaks locked, bed alarm set, and all needs within reach.   PT Evaluation Precautions/Restrictions Precautions Precautions: Fall Restrictions Weight Bearing Restrictions: Yes RLE Weight Bearing: Non weight bearing General PT Amount of Missed Time (min): 30 Minutes PT Missed Treatment Reason: Patient ill (Comment)(nausea/vomiting) Vital SignsTherapy  Vitals Pulse Rate: 82 BP: (!) 162/57 Patient Position (if appropriate): Lying Oxygen Therapy SpO2: 97 % O2 Device: Room Air Pain  Patient denied pain throughout session Home Living/Prior [place] Available Help at Discharge: Family;Available 24 hours/day(Repotrs family will piece together 24hr care) Type of Home: House Home Access: Stairs to enter Entrance Stairs-Number of Steps: 1+1 Entrance Stairs-Rails: None Home Layout: One level Bathroom Shower/Tub: Multimedia programmer: Standard Bathroom Accessibility: Yes  Lives With: Alone Prior Function Level of Independence: Requires assistive device for independence;Needs assistance with homemaking;Independent with transfers;Independent with gait  Able to Take Stairs?: Yes(single step into home) Driving: No Vocation: Retired Leisure: Hobbies-yes (Comment) Vision/Perception  Perception Perception: Within Functional Limits Praxis Praxis: Intact  Cognition Overall Cognitive Status: No family/caregiver present to determine baseline cognitive functioning Arousal/Alertness: Awake/alert Orientation Level: Oriented to person;Oriented to place Memory: Impaired Memory Impairment: Decreased short term memory;Decreased recall of new information Decreased Short Term Memory: Verbal basic;Functional basic Awareness: Impaired Awareness Impairment: Emergent impairment Problem Solving: Appears intact Safety/Judgment: Impaired Comments: Unable to maintain NWBing pre-cautions without max cueing and facilitation Sensation Sensation Light Touch: Appears Intact Proprioception: Appears Intact Coordination Gross Motor Movements are Fluid and Coordinated: No Fine Motor Movements are Fluid and Coordinated: Yes Coordination and Movement Description: Impaired 2/2 R LE NWBing and generalized weakness Motor  Motor Motor: Other (comment) Motor - Skilled Clinical Observations: Generalized weakness and impaired 2/2 NWBing  pre-cautions  Mobility Bed Mobility Bed Mobility: Rolling Right;Rolling Left;Supine to Sit;Sitting - Scoot to [employer] of Bed;Sit to Supine Rolling Right: Minimal Assistance - Patient > 75%(with use of bed rail) Rolling Left: Supervision/Verbal cueing(with use of bed rail) Supine to Sit: Minimal Assistance - Patient > 75%(with bed flat without use of bed rail) Sitting - Scoot to Edge of Bed: Minimal Assistance - Patient > 75% Sit to Supine: Minimal Assistance - Patient > 75%(with bed flat without use of bed rail) Transfers Transfers: Sit to Stand;Stand to [employer];Lateral/Scoot Transfers Sit to Stand: Moderate Assistance - Patient 50-74%;2 Helpers Stand to Sit: Moderate Assistance - Patient 50-74%;2 Helpers Stand Pivot Transfers: Moderate Assistance - Patient 50 - 74%;2 Helpers Stand Pivot Transfer Details: Tactile cues for placement;Visual cues/gestures for sequencing;Manual facilitation for weight bearing;Verbal cues for sequencing;Verbal cues for safe use of DME/AE;Visual cues for safe use of DME/AE;Verbal cues for technique;Visual cues/gestures for precautions/safety;Verbal cues for precautions/safety Stand Pivot Transfer Details (indicate cue type and reason): Demonstrated NWB and use of RW and provided cues for R NWB with PT foot under patient's R heel to maintain NWB, as patient was unable to do so on her own. Provided cues for pushing up and reaching back with her L hand using the RW Lateral/Scoot Transfers: Moderate Assistance - Patient 50-74%;2 Helpers(with slide board) Transfer (Assistive device): Rolling walker Locomotion  Gait Ambulation: No Gait Gait: No Stairs / Additional Locomotion Stairs: No Wheelchair Mobility Wheelchair Mobility: Yes Wheelchair Assistance: Moderate Assistance - Patient 50 - 74% Wheelchair Propulsion: Both upper extremities;Left lower extremity Wheelchair Parts Management: Needs assistance Distance: 15 feet with significantly increased  time and effort before requiring a rest break due to fatigue  Trunk/Postural Assessment  Cervical Assessment Cervical Assessment: Exceptions to WFL(Forward head) Thoracic Assessment Thoracic Assessment: Exceptions to WFL(Kyphotic; Rounded shoulders) Lumbar Assessment Lumbar Assessment: Exceptions to WFL(Posterior pelvic tilt) Postural Control Postural Control: Deficits on evaluation(decreased)  Balance Balance Balance Assessed: Yes Static Sitting Balance Static Sitting - Balance Support: Feet supported;No upper extremity supported Static Sitting - Level of Assistance: 5: Stand by assistance Dynamic Sitting Balance Dynamic Sitting - Balance Support: Feet supported;During functional activity Dynamic Sitting - Level of Assistance: 5: Stand by  assistance Dynamic Sitting - Balance Activities: Lateral lean/weight shifting;Forward lean/weight shifting;Reaching for objects Static Standing Balance Static Standing - Balance Support: Bilateral upper extremity supported Static Standing - Level of Assistance: 1: +2 Total assist Extremity Assessment  RUE Assessment RUE Assessment: Exceptions to [facility] Active Range of Motion (AROM) Comments: decreased functional abduction and external rotation with brushing hair, able to reach side of her head, but not back of head when combing hair, reports she could do this PTA General Strength Comments: 4-/5 throughout LUE Assessment LUE Assessment: Exceptions to [facility] Active Range of Motion (AROM) Comments: Dacreased shoulder flexion and abduction to approximately 90 degrees General Strength Comments: 4-/5 throughout RLE Assessment RLE Assessment: Exceptions to [facility] Active Range of Motion (AROM) Comments: Fixed ankle, knee flexion/extension WFL, hip flexion limited by body habitus General Strength Comments: Grossly in sitting: 4-/5 hip flexion, at least 3/5 knee flexion/extension, can wiggle toes LLE Assessment LLE Assessment: Within Functional Limits Active  Range of Motion (AROM) Comments: WFL for functional mobility with hip flexion limited by body habitus General Strength Comments: Grossly in sitting: 5/5 throughout except hip flexion 4-/5    Refer to Care Plan for Long Term Goals  Recommendations for other services: Neuropsych  Discharge Criteria: Patient will be discharged from PT if patient refuses treatment 3 consecutive times without medical reason, if treatment goals not met, if there is a change in medical status, if patient makes no progress towards goals or if patient is discharged from hospital.  The above assessment, treatment plan, treatment alternatives and goals were discussed and mutually agreed upon: by patient  Laquashia Mergenthaler L Yukari Flax PT, DPT  07/16/2019, 4:55 PM

## 2019-07-16 NOTE — Discharge Instructions (Addendum)
Inpatient Rehab Discharge Instructions  Katherine MaxwellMary S Kocak Discharge date and time:  08/05/19  Activities/Precautions/ Functional Status: Activity: no lifting, driving, or strenuous exercise till cleared by MD Diet: regular diet Wound Care: Keep splint dry. Contact Dr. Victorino DikeHewitt if you notice any drainage.    Functional status:  ___ No restrictions     ___ Walk up steps independently _X__ 24/7 supervision/assistance   ___ Walk up steps with assistance ___ Intermittent supervision/assistance  ___ Bathe/dress independently ___ Walk with walker     _X__ Bathe/dress with assistance ___ Walk Independently    ___ Shower independently ___ Walk with assistance    ___ Shower with assistance _X__ No alcohol     ___ Return to work/school ________   COMMUNITY REFERRALS UPON DISCHARGE:    Home Health:   PT     OT     RN                    Agency:  [facility]   Phone: [phone]   Medical Equipment/Items Ordered:  Wheelchair, cushion, hospital bed, commode, hoyer lift and transfer board                                                      Agency/Supplier:  [facility] @ [phone]     Special Instructions: 1. No weight on right leg. Follow up with Dr. Victorino DikeHewitt in 3 weeks--He will advise on weight bearing status.  2. Need to keep adjusting laxatives to make sure that you have a good BM daily. Avoiding narcotics will help to prevent the problems with ileus.  My questions have been answered and I understand these instructions. I will adhere to these goals and the provided educational materials after my discharge from the hospital.  Patient/Caregiver Signature _______________________________ Date __________  Clinician Signature _______________________________________ Date __________  Please bring this form and your medication list with you to all your follow-up doctor's appointments. Information on my medicine - ELIQUIS (apixaban)  This medication education was reviewed with me or  my healthcare representative as part of my discharge preparation.    Why was Eliquis prescribed for you? Eliquis was prescribed for you to reduce the risk of a blood clot forming that can cause a stroke if you have a medical condition called atrial fibrillation (a type of irregular heartbeat).  What do You need to know about Eliquis ? Take your Eliquis TWICE DAILY - one tablet in the morning and one tablet in the evening with or without food. If you have difficulty swallowing the tablet whole please discuss with your pharmacist how to take the medication safely.  Take Eliquis exactly as prescribed by your doctor and DO NOT stop taking Eliquis without talking to the doctor who prescribed the medication.  Stopping may increase your risk of developing a stroke.  Refill your prescription before you run out.  After discharge, you should have regular check-up appointments with your healthcare provider that is prescribing your Eliquis.  In the future your dose may need to be changed if your kidney function or weight changes by a significant amount or as you get older.  What do you do if you miss a dose? If you miss a dose, take it as soon as you remember on the same day and resume taking twice daily.  Do  not take more than one dose of ELIQUIS at the same time to make up a missed dose.  Important Safety Information A possible side effect of Eliquis is bleeding. You should call your healthcare provider right away if you experience any of the following: ? Bleeding from an injury or your nose that does not stop. ? Unusual colored urine (red or dark brown) or unusual colored stools (red or black). ? Unusual bruising for unknown reasons. ? A serious fall or if you hit your head (even if there is no bleeding).  Some medicines may interact with Eliquis and might increase your risk of bleeding or clotting while on Eliquis. To help avoid this, consult your healthcare provider or pharmacist prior to  using any new prescription or non-prescription medications, including herbals, vitamins, non-steroidal anti-inflammatory drugs (NSAIDs) and supplements.  This website has more information on Eliquis (apixaban): http://www.eliquis.com/eliquis/home

## 2019-07-16 NOTE — Progress Notes (Signed)
Inpatient Rehabilitation  Patient information reviewed and entered into eRehab system by Kanoa Phillippi M. Roque Schill, M.A., CCC/SLP, PPS Coordinator.  Information including medical coding, functional ability and quality indicators will be reviewed and updated through discharge.    

## 2019-07-16 NOTE — Progress Notes (Signed)
[name] PHYSICAL MEDICINE & REHABILITATION PROGRESS NOTE   Subjective/Complaints: Nauseas d/t constipation. Had two hard bm's this morning but doesn't feel better  ROS: Patient denies fever, rash, sore throat, blurred vision,  vomiting, diarrhea, cough, shortness of breath or chest pain, joint or back pain, headache, or mood change.    Objective:   No results found. Recent Labs    07/15/19 0239 07/16/19 0759  WBC 7.5 10.9*  HGB 8.2* 9.9*  HCT 25.7* 30.0*  PLT 128* 192   Recent Labs    07/15/19 0239 07/16/19 0759  NA 134* 133*  K 4.6 4.1  CL 99 95*  CO2 26 26  GLUCOSE 101* 138*  BUN 27* 22  CREATININE 1.21* 1.19*  CALCIUM 8.8* 8.9    Intake/Output Summary (Last 24 hours) at 07/16/2019 1112 Last data filed at 07/16/2019 0819 Gross per 24 hour  Intake 120 ml  Output 1235 ml  Net -1115 ml     Physical Exam: Vital Signs Blood pressure (!) 166/79, pulse 83, temperature 98.3 F (36.8 C), temperature source Oral, resp. rate 15, height 5\' 3"  (1.6 m), weight 108.1 kg, SpO2 95 %. Constitutional: No distress . Vital signs reviewed. obese HEENT: EOMI, oral membranes moist Neck: supple Cardiovascular: RRR without murmur. No JVD    Respiratory: CTA Bilaterally without wheezes or rales. Normal effort    GI: BS +, non-tender, non-distended  Musculoskeletal:        General: Tenderness and edema present.     Comments: Right leg in ACE wrap  -valgus deformities at bilateral knees Neurological: She is alert and oriented to person, place, and time.  HOH   Motor: Bilateral upper extremities: 4-/5 proximal distal Right lower extremity: Hip flexion, knee extension 3+/5, wiggles toes Sensation intact light touch Left lower extremity: 4 medicine/5 proximal distal  Skin: She is not diaphoretic.  Right lower extremity with dressing Psychiatric:  cooperative    Assessment/Plan: 1. Functional deficits secondary to right ankle fx which require 3+ hours per day of  interdisciplinary therapy in a comprehensive inpatient rehab setting.  Physiatrist is providing close team supervision and 24 hour management of active medical problems listed below.  Physiatrist and rehab team continue to assess barriers to discharge/monitor patient progress toward functional and medical goals  Care Tool:  Bathing              Bathing assist       Upper Body Dressing/Undressing Upper body dressing   What is the patient wearing?: Hospital gown only    Upper body assist Assist Level: Minimal Assistance - Patient > 75%    Lower Body Dressing/Undressing Lower body dressing      What is the patient wearing?: Incontinence brief     Lower body assist Assist for lower body dressing: Moderate Assistance - Patient 50 - 74%     Toileting Toileting    Toileting assist Assist for toileting: Moderate Assistance - Patient 50 - 74%     Transfers Chair/bed transfer  Transfers assist  Chair/bed transfer activity did not occur: Safety/medical concerns  Chair/bed transfer assist level: Moderate Assistance - Patient 50 - 74%     Locomotion Ambulation   Ambulation assist              Walk 10 feet activity   Assist           Walk 50 feet activity   Assist           Walk 150 feet activity  Assist           Walk 10 feet on uneven surface  activity   Assist           Wheelchair     Assist               Wheelchair 50 feet with 2 turns activity    Assist            Wheelchair 150 feet activity     Assist          Medical Problem List and Plan: 1.  Deficits with mobility, transfers, self-care secondary to right ankle fracture.             Patient is beginning CIR therapies today including PT and OT  2.  Antithrombotics: -DVT/anticoagulation:  Pharmaceutical: Other (comment) Eliquis             -antiplatelet therapy: N/A 3. Pain Management: Continue oxycodone as needed 4. Mood: Team to  provide ego support for chronic anxiety.  LCSW to follow for evaluation and support             -antipsychotic agents: N/A 5. Neuropsych: This patient is not fully capable of making decisions on her own behalf. 6. Skin/Wound Care: Monitor incision for healing daily.  Routine pressure relief measures 7. Fluids/Electrolytes/Nutrition: encourage PO  .add protein supp for low albumin 8.  Acute on chronic renal failure: IVF stopped              -I personally reviewed the patient's labs today.    -sl hyponatremia,otherwise ok 9. Iron deficiency anemia with ABLA: Continue iron supplement             -hgb up to 9.9 this morning 10. HTN: Monitor blood pressures and watch for signs of orthostasis.  Continue losartan, diltiazem, metoprolol, and furosemide daily.             bp borderline, may be due to GI discomfort this morning.  11. Chronic diastolic CHF: Discontinue IV fluids. Continue losartan, metoprolol, furosemide.             Daily weights  - Filed Weights   07/15/19 1656 07/16/19 0416  Weight: 108.2 kg 108.1 kg    12.  PAF: Monitor heart rate TID.  Continue diltiazem and metoprolol.             HR controlled today 13.  Anxiety disorder: Continue Celexa daily 14. Constipation: two hard bm's this morning  -fleet enema today  -augment bowel regimen    LOS: 1 days A FACE TO FACE EVALUATION WAS PERFORMED  Katherine Tyler 07/16/2019, 11:12 AM

## 2019-07-16 NOTE — Progress Notes (Addendum)
Patient continues to present with nausea and vomiting. Patient has vomited a total of 3 medium amounts of green emesis. LBM documented was 08/05, hard, small and black BM. Before BM on 08/05 patient states "last time I pooped before yesterday was the Sunday I fell". Patient abdomin obese and distended. This nurse gave patient a dulcolax suppository. This nurse will continue to monitor and pass on report to day shift nurse. Patient head of bed elevated and call bell at reach.

## 2019-07-16 NOTE — Progress Notes (Signed)
Physical Therapy Note  Patient Details  Name: Katherine Tyler MRN: [id number] Date of Birth: 04-27-1929 Today's Date: 07/16/2019    Attempted PT session this afternoon. Patient in bed with daughter, Katherine Tyler, at bedside and patient agreeable to PT session, however she became nauseous prior to starting bed level exercises and had 2 bouts of emesis of green bial. PT adjusted HOB to 90 degrees and provided emesis bag to patient. RN and NT made aware. Patient missed 30 min of skilled PT due to nausea/emisis. Will re-attempt PT session at a later time as able.      Cantrell Larouche L [place] Dani PT, DPT  07/16/2019, 4:58 PM

## 2019-07-17 ENCOUNTER — Inpatient Hospital Stay (HOSPITAL_COMMUNITY): Payer: Medicare Other | Admitting: Physical Therapy

## 2019-07-17 ENCOUNTER — Inpatient Hospital Stay (HOSPITAL_COMMUNITY): Payer: Medicare Other | Admitting: Occupational Therapy

## 2019-07-17 DIAGNOSIS — I480 Paroxysmal atrial fibrillation: Secondary | ICD-10-CM

## 2019-07-17 MED ORDER — POLYETHYLENE GLYCOL 3350 17 G PO PACK
17.0000 g | PACK | Freq: Every day | ORAL | Status: DC
  Administered 2019-07-18 – 2019-07-31 (×12): 17 g via ORAL
  Filled 2019-07-17 (×18): qty 1

## 2019-07-17 NOTE — Progress Notes (Signed)
Social Work Assessment and Plan   Patient Details  Name: Katherine Tyler MRN: [id number] Date of Birth: 05-May-1929  Today's Date: 07/17/2019  Problem List:  Patient Active Problem List   Diagnosis Date Noted  . Closed fracture of right fibula and tibia, sequela 07/15/2019  . Acute blood loss anemia   . AKI (acute kidney injury) ([place])   . Fracture of tibial shaft, open 07/12/2019  . PAF (paroxysmal atrial fibrillation) ([place])   . Obesity   . Hypertension   . Hyperlipidemia   . GERD (gastroesophageal reflux disease)   . Hiatal hernia   . Arthritis   . Anxiety   . Chronic diastolic CHF (congestive heart failure) ([place])   . Current use of long term anticoagulation 01/31/2018  . Chronic diastolic heart failure ([place]) 01/31/2018  . A-fib ([place]) 01/16/2018  . CHF (congestive heart failure) ([place]) 01/14/2018  . Hyperkalemia 01/14/2018  . Iron deficiency anemia 10/01/2017  . Osteoarthritis 09/24/2016  . Essential hypertension   . Hyponatremia 06/18/2016  . Hepatic cirrhosis ([place]) 06/18/2016  . Hypokalemia 06/18/2016  . Metabolic syndrome 23/55/7322  . Obesity (BMI 30-39.9) 09/12/2015  . GAD (generalized anxiety disorder) 05/03/2015  . Varicose veins 06/24/2013  . Seasonal allergic rhinitis 04/30/2013  . Vitamin D deficiency 04/16/2013  . Hypothyroidism 04/16/2013  . Genu valgum (acquired) 04/16/2013   Past Medical History:  Past Medical History:  Diagnosis Date  . Anxiety   . Arthritis   . Chronic diastolic CHF (congestive heart failure) ([place])   . GERD (gastroesophageal reflux disease)   . Hiatal hernia   . Hyperkalemia   . Hyperlipidemia   . Hypertension   . Hypothyroidism   . Iron deficiency anemia   . Obesity   . PAF (paroxysmal atrial fibrillation) ([place])    a. diagnosed in 01/2018. Started on Eliquis for anticoagulation.   . Vitamin D deficiency    Past Surgical History:  Past Surgical History:  Procedure Laterality Date  . CHOLECYSTECTOMY    . I&D EXTREMITY Right  07/12/2019   Procedure: Irrigation And Debridement Extremity;  Surgeon: Wylene Simmer, MD;  Location: [facility];  Service: Orthopedics;  Laterality: Right;  . ORIF ANKLE FRACTURE Right 07/12/2019   Procedure: OPEN  AND REDUCTION INTERNAL FIXATION (ORIF) ANKLE FRACTURE;  Surgeon: Wylene Simmer, MD;  Location: [facility];  Service: Orthopedics;  Laterality: Right;  . TUBAL LIGATION  1960   Social History:  reports that she has never smoked. She quit smokeless tobacco use about 62 years ago.  Her smokeless tobacco use included snuff. She reports that she does not drink alcohol or use drugs.  Family / Support Systems Marital Status: Widow/Widower Patient Roles: Parent Children: Pt has 6 adult children with most living in the [place] area near her.  Daughter, Steffanie Dunn @ [phone];  [name] @ [phone] Anticipated Caregiver: pt has 4 daughters and 2 sons Ability/Limitations of Caregiver: family will rotate to provide 24/7 assist Caregiver Availability: 24/7 Family Dynamics: Pt reports that all family members are very supportive.  Daughter states, "there is always somebody going by there (pt's home) everyday.  We can cover 24 hour."  Social History Preferred language: English Religion: Non-Denominational Cultural Background: NA Read: Yes Write: Yes Employment Status: Retired Public relations account executive Issues: None Guardian/Conservator: None - per MD, pt is not fully capable of making decisions on her own behalf - defer to children.   Abuse/Neglect Abuse/Neglect Assessment Can Be Completed: Yes Physical Abuse: Denies Verbal Abuse: Denies Sexual Abuse: Denies Exploitation  of patient/patient's resources: Denies Self-Neglect: Denies  Emotional Status [name] affect, behavior and adjustment status: Pt very pleasant; daughter, Steward DroneBrenda at bedside and assists patient with completion of assessment interview.  Pt smiling and laughing with myself and daughter throughout interveiw.  Pt denies any s/s  of depression/ anxiety - will monitor and refer for neuropsychology as needed. Recent Psychosocial Issues: None Psychiatric History: None Substance Abuse History: None  Patient / Family Perceptions, Expectations & Goals Pt/Family understanding of illness & functional limitations: Pt and daughter with good, general understanding of fx and NWB restrictions. Premorbid pt/family roles/activities: Pt completely independent overall and living alone. Anticipated changes in roles/activities/participation: Per goals of overall min assist, family will need to provide 24/7 support but [facility] only [facility] Pt/family expectations/goals: "I guess I just need to be able to get around better."  [employer] Community Agencies: None Premorbid Home Care/DME Agencies: None Transportation available at discharge: yes  Discharge Planning Living Arrangements: Alone Support Systems: Children Type of Residence: Private residence Insurance Resources: [employer] Entertainment[employer]icare, [employer] Energy[employer]icaid (specify county) Surveyor, quantityinancial Resources: Restaurant manager, fast foodocial Security Financial Screen Referred: No Living Expenses: Own Money Management: Patient, Family Does the patient have any problems obtaining your medications?: No Home Management: Pt and family Patient/Family Preliminary Plans: Pt to d/c to her home with family providing 24/7 assistance there. Social Work Anticipated Follow Up Needs: HH/OP Expected length of stay: 18-21 days  Clinical Impression Very pleasant woman here after a fall at home and suffering tib/fib fx and is NWB.  Family very supportive and able to provide recommended 24/7 assistance.  Pt denies any s/s of emotional distress.  Will follow for d/c planning needs.  Haeleigh Streiff 07/17/2019, 1:22 PM

## 2019-07-17 NOTE — Progress Notes (Signed)
Occupational Therapy Session Note  Patient Details  Name: Katherine Tyler MRN: [id number] Date of Birth: 08-05-1929  Today's Date: 07/17/2019 OT Individual Time: 4259-5638 and 7564-3329 OT Individual Time Calculation (min): 70 min and 43 min   Short Term Goals: Week 1:  OT Short Term Goal 1 (Week 1): Pt will consistently transfer with +1 assist using LRAD while maintaining WBing pre-cautions OT Short Term Goal 2 (Week 1): Pt will don pants with mod A using AE PRN OT Short Term Goal 3 (Week 1): Pt will consistently complete sit>stand with no more than mod A using LRAD while maintaining WBing pre-cautions during functional task.  Skilled Therapeutic Interventions/Progress Updates:    Session One: PT seen for OT ADL bathing/dressing session. Pt awake in supine upon arrival, denied pain at rest and agreeable to tx session. Pt desiring to receive pain meds in prep for mobility, RN made aware and administered pain medication during session.  Pt transferred to sitting EOB with mod A using hospital bed functions. Completed bathing/dressing routine seated on EOB. Set-up/supervision for UB bathing and min A UB dressing as pt unable to extend arms high enough in order to thread shirt over head. Pt able to wash B LEs with min A, total A for pericare/buttock hygiene from bed level prior to getting upright. Pants threaded total A and she stood with max A +1 with +2 to help pull up pants. Therapist's foot placed under pt's R LE in extended position to facilitate adherence to [place] pre-cautions, able to maintain initially, however, with fatigue began to WB more. Completed x3 sit>stand in total during session, tolerating at most ~30 seconds standing trial. Completed sliding board transfer to w/c, max A +1. Pt with generalized UE weakness and limited ROM 2/2 arthritis making it difficult for her to assist with transfer. Pt required extended seated rest break following transfer.  Attempted to have pt self propel w/c using  B UEs and L LE. Pt with limited ability to reach back far enough on w/c rim to effectively propel w/c due to ROM limitations and body habitus. UE strengthening with #1 dowel rod. Chest press and bicep curls x12 with multi-modal cuing for sequencing and technique. AAROM to complete x10 shoulder flexion on R/L.  Pt returned to room at end of session, left seated up in w/c with all needs in reach and chair belt alarm on.   Session Two: Pt seen for OT session focusing on functional transfers and sit>stand in prep for ADL task. Pt in supine upon arrival, no formal complaints of pain though voiced being fatigued from previous tx sessions, willing to participate as able.  She transferred to sitting EOB with mod A +1 with VCs for sequencing/technique. Completed sliding board transfer EOB>w/c Bed elevated for downhill transfer. Therapist's foot placed under pt's R LE in order to facilitate NWBing. Pt initially min A to initiate movement (+2 required to stabilize equipment), however, ~50% of the way though transfer pt fatigues and ultimately requires mod-max A +2 to position hips fully back into w/c.  Pt taken to therapy gym and completed sliding board transfer to EOM in same manner as described above. Requires rest breaks throughout transfer process and VCs for head/hip relationship. Mat elevated and pt completed in total 5x sit>stand from elevated mat to RW. R LE elevated by therapist to prevent WBing though partial WBing through R heel noted. +2 mod A for sit> stand and Mod A for controlled descent onto mat. Rest breaks required btwn each  trial and pt tolerating ~15 seconds each standing trial before requiring seated rest break. Heavy UE reliance and unable to let go of RW during standing.  Pt returned to w/c in same manner as described above. Left seated in w/c with hand off to PT.    Therapy Documentation Precautions:  Precautions Precautions: Fall Restrictions Weight Bearing Restrictions: Yes RLE Weight  Bearing: Non weight bearing   Therapy/Group: Individual Therapy  Katherine Tyler L 07/17/2019, 7:05 AM

## 2019-07-17 NOTE — IPOC Note (Addendum)
Overall Plan of Care [facility]) Patient Details Name: Katherine Tyler MRN: [id number] DOB: [birth year]  Admitting Diagnosis: Closed fracture of right fibula and tibia, sequela  Hospital Problems: Principal Problem:   Closed fracture of right fibula and tibia, sequela     Functional Problem List: Nursing Bladder, Bowel, Safety, Nutrition, Pain  PT Balance, Behavior, Edema, Endurance, Motor, Nutrition, Pain, Perception, Safety, Sensory, Skin Integrity  OT Balance, Safety, Endurance, Pain  SLP    TR         Basic ADL's: OT Grooming, Bathing, Dressing, Toileting     Advanced  ADL's: OT       Transfers: PT Bed Mobility, Bed to Chair, Car, Furniture, Floor  OT Toilet, Metallurgist: PT Ambulation, Emergency planning/management officer, Stairs     Additional Impairments: OT None  SLP        TR      Anticipated Outcomes Item Anticipated Outcome  Self Feeding Indep  Swallowing      Basic self-care  Min A  Toileting  Mod A   Bathroom Transfers Min A  Bowel/Bladder  Mod I assist   Transfers  min A  Locomotion  set-up assist at w/c level  Communication     Cognition     Pain  Mod I assist   Safety/Judgment  Mod I assist    Therapy Plan: PT Intensity: Minimum of 1-2 x/day ,45 to 90 minutes PT Frequency: 5 out of 7 days PT Duration Estimated Length of Stay: 2.5-3 weeks OT Intensity: Minimum of 1-2 x/day, 45 to 90 minutes OT Frequency: 5 out of 7 days OT Duration/Estimated Length of Stay: 2.5-3 weeks     Due to the current state of emergency, patients may not be receiving their 3-hours of Medicare-mandated therapy.   Team Interventions: Nursing Interventions Pain Management, Bladder Management, Bowel Management, Patient/Family Education, Discharge Planning, Medication Management  PT interventions Ambulation/gait training, Discharge planning, Functional mobility training, Psychosocial support, Therapeutic Activities, Balance/vestibular training, Disease  management/prevention, Neuromuscular re-education, Skin care/wound management, Therapeutic Exercise, Wheelchair propulsion/positioning, Cognitive remediation/compensation, DME/adaptive equipment instruction, Pain management, Splinting/orthotics, UE/LE Strength taining/ROM, Community reintegration, Technical sales engineer stimulation, Patient/family education, IT trainer, UE/LE Coordination activities  OT Interventions Training and development officer, Discharge planning, Pain management, Self Care/advanced ADL retraining, Therapeutic Activities, UE/LE Coordination activities, Therapeutic Exercise, Patient/family education, Functional mobility training, DME/adaptive equipment instruction, Psychosocial support, Splinting/orthotics, UE/LE Strength taining/ROM, Wheelchair propulsion/positioning  SLP Interventions    TR Interventions    SW/CM Interventions Discharge Planning, Psychosocial Support, Patient/Family Education   Barriers to Discharge MD  Medical stability  Nursing Incontinence, Nutrition means    PT Home environment access/layout, Weight bearing restrictions, Other (comments) difficulty maintaining R LE NWB; difficulty with w/c propulsion 2/2 body habitus  OT Weight bearing restrictions Pt has ddificulty maintaining R NWB  SLP      SW       Team Discharge Planning: Destination: PT-Home ,OT- Home , SLP-  Projected Follow-up: PT-Home health PT, OT-  Home health OT, 24 hour supervision/assistance, SLP-  Projected Equipment Needs: PT-Wheelchair (measurements), To be determined, OT- To be determined, SLP-  Equipment Details: PT-22"x18" w/c with standard cushion and R ELR, patient has RW, may need bariatric RW, OT-  Patient/family involved in discharge planning: PT- Patient,  OT-Patient, SLP-   MD ELOS: 21-24 days Medical Rehab Prognosis:  Excellent Assessment: The patient has been admitted for CIR therapies with the diagnosis of right tib-fib fx. The team will be addressing functional  mobility, strength, stamina, balance, safety,  adaptive techniques and equipment, self-care, bowel and bladder mgt, patient and caregiver education, pain mgt, ortho precautions, community reentry. Goals have been set at min assist to mod assist for self-care and min assist at w/c level for mobiltiy.   Due to the current state of emergency, patients may not be receiving their 3 hours per day of Medicare-mandated therapy.    Katherine OysterZachary T. Swartz, MD, FAAPMR      See Team Conference Notes for weekly updates to the plan of care

## 2019-07-17 NOTE — Care Management (Signed)
Inpatient [facility] Statement of Services  Patient Name:  Katherine Tyler  Date:  07/17/2019  Welcome to the [facility].  Our goal is to provide you with an individualized program based on your diagnosis and situation, designed to meet your specific needs.  With this comprehensive rehabilitation program, you will be expected to participate in at least 3 hours of rehabilitation therapies Monday-Friday, with modified therapy programming on the weekends.  Your rehabilitation program will include the following services:  Physical Therapy (PT), Occupational Therapy (OT), 24 hour per day rehabilitation nursing, Therapeutic Recreaction (TR), Case Management (Social Worker), Rehabilitation Medicine, Nutrition Services and Pharmacy Services  Weekly team conferences will be held on Tuesdays to discuss your progress.  Your Social Worker will talk with you frequently to get your input and to update you on team discussions.  Team conferences with you and your family in attendance may also be held.  Expected length of stay: 18-21 days   Overall anticipated outcome: minimal assistance @ wheelchair  Depending on your progress and recovery, your program may change. Your Social Worker will coordinate services and will keep you informed of any changes. Your Social Worker's name and contact numbers are listed  below.  The following services may also be recommended but are not provided by the [facility] will be made to provide these services after discharge if needed.  Arrangements include referral to agencies that provide these services.  Your insurance has been verified to be:  Medicare; Medicaid Your primary doctor is:  Lenna Gilford  Pertinent information will be shared with your doctor and your insurance company.  Social Worker:  [place],  [place] or ([phone]   Information discussed with and copy given to patient by: Lennart Pall, 07/17/2019, 1:23 PM

## 2019-07-17 NOTE — Progress Notes (Signed)
[facility] PHYSICAL MEDICINE & REHABILITATION PROGRESS NOTE   Subjective/Complaints: Moved bowels completely yesterda. Feels much better this morning. Ready for therapy  ROS: Patient denies fever, rash, sore throat, blurred vision, nausea, vomiting, diarrhea, cough, shortness of breath or chest pain, joint or back pain, headache, or mood change.    Objective:   No results found. Recent Labs    07/15/19 0239 07/16/19 0759  WBC 7.5 10.9*  HGB 8.2* 9.9*  HCT 25.7* 30.0*  PLT 128* 192   Recent Labs    07/15/19 0239 07/16/19 0759  NA 134* 133*  K 4.6 4.1  CL 99 95*  CO2 26 26  GLUCOSE 101* 138*  BUN 27* 22  CREATININE 1.21* 1.19*  CALCIUM 8.8* 8.9    Intake/Output Summary (Last 24 hours) at 07/17/2019 1028 Last data filed at 07/17/2019 0830 Gross per 24 hour  Intake 598 ml  Output 1025 ml  Net -427 ml     Physical Exam: Vital Signs Blood pressure (!) 162/77, pulse 93, temperature 98.4 F (36.9 C), temperature source Oral, resp. rate 18, height 5\' 3"  (1.6 m), weight 107.1 kg, SpO2 92 %. Constitutional: No distress . Vital signs reviewed. HEENT: EOMI, oral membranes moist Neck: supple Cardiovascular: RRR without murmur. No JVD    Respiratory: CTA Bilaterally without wheezes or rales. Normal effort    GI: BS +, non-tender, non-distended  Musculoskeletal:        General: Tenderness and edema present.     Comments: Right leg in ACE wrap  -valgus deformities at bilateral knees Neurological: She is alert and oriented to person, place, and time.  HOH   Motor: Bilateral upper extremities: 4-/5 proximal distal Right lower extremity: Hip flexion, knee extension 3+/5, wiggles toes without limitation Sensation intact to light touch Left lower extremity: 4/5 proximal to distal  Skin: She is not diaphoretic.  Right lower extremity with dressing Psychiatric:  cooperative    Assessment/Plan: 1. Functional deficits secondary to right ankle fx which require 3+ hours per  day of interdisciplinary therapy in a comprehensive inpatient rehab setting.  Physiatrist is providing close team supervision and 24 hour management of active medical problems listed below.  Physiatrist and rehab team continue to assess barriers to discharge/monitor patient progress toward functional and medical goals  Care Tool:  Bathing    Body parts bathed by patient: Right arm, Left arm, Chest, Abdomen, Right upper leg, Left upper leg, Face   Body parts bathed by helper: Front perineal area, Buttocks, Right lower leg, Left lower leg     Bathing assist Assist Level: 2 Helpers     Upper Body Dressing/Undressing Upper body dressing   What is the patient wearing?: Pull over shirt    Upper body assist Assist Level: Moderate Assistance - Patient 50 - 74%    Lower Body Dressing/Undressing Lower body dressing      What is the patient wearing?: Pants, Incontinence brief     Lower body assist Assist for lower body dressing: 2 Helpers     Toileting Toileting    Toileting assist Assist for toileting: 2 Helpers     Transfers Chair/bed transfer  Transfers assist  Chair/bed transfer activity did not occur: Safety/medical concerns  Chair/bed transfer assist level: 2 Helpers     Locomotion Ambulation   Ambulation assist   Ambulation activity did not occur: Safety/medical concerns(decreased strength/endurance, R LE NWB)          Walk 10 feet activity   Assist  Walk 10 feet  activity did not occur: Safety/medical concerns(decreased strength/endurance, R LE NWB)        Walk 50 feet activity   Assist Walk 50 feet with 2 turns activity did not occur: Safety/medical concerns(decreased strength/endurance, R LE NWB)         Walk 150 feet activity   Assist Walk 150 feet activity did not occur: Safety/medical concerns(decreased strength/endurance, R LE NWB)         Walk 10 feet on uneven surface  activity   Assist Walk 10 feet on uneven surfaces  activity did not occur: Safety/medical concerns(decreased strength/endurance, R LE NWB)         Wheelchair     Assist Will patient use wheelchair at discharge?: Yes Type of Wheelchair: Manual    Wheelchair assist level: Moderate Assistance - Patient 50 - 74%, Set up assist Max wheelchair distance: 2'    Wheelchair 50 feet with 2 turns activity    Assist    Wheelchair 50 feet with 2 turns activity did not occur: Safety/medical concerns(decreased UE strength/endurance)       Wheelchair 150 feet activity     Assist Wheelchair 150 feet activity did not occur: Safety/medical concerns(decreased UE strength/endurance, R LE NWB)        Medical Problem List and Plan: 1.  Deficits with mobility, transfers, self-care secondary to right ankle fracture.             Continue CIR therapies today including PT and OT  2.  Antithrombotics: -DVT/anticoagulation:  Pharmaceutical: Other (comment) Eliquis             -antiplatelet therapy: N/A 3. Pain Management: Continue oxycodone as needed 4. Mood: Team to provide ego support for chronic anxiety.  LCSW to follow for evaluation and support             -antipsychotic agents: N/A 5. Neuropsych: This patient is not fully capable of making decisions on her own behalf. 6. Skin/Wound Care: Monitor incision for healing daily.  Routine pressure relief measures 7. Fluids/Electrolytes/Nutrition: encourage PO  .added protein supp for low albumin 8.  Acute on chronic renal failure: IVF stopped              -encourage PO fluids  -sl hyponatremia,otherwise ok 9. Iron deficiency anemia with ABLA: Continue iron supplement             -hgb up to 9.9  10. HTN: Monitor blood pressures and watch for signs of orthostasis.  Continue losartan, diltiazem, metoprolol, and furosemide daily.             bp remains borderline, may be due to GI discomfort this morning.  11. Chronic diastolic CHF: Discontinue IV fluids. Continue losartan, metoprolol,  furosemide.             Daily weights  -weights stable. Follow for trend Filed Weights   07/15/19 1656 07/16/19 0416 07/17/19 0328  Weight: 108.2 kg 108.1 kg 107.1 kg    12.  PAF: Monitor heart rate TID.  Continue diltiazem and metoprolol.             HR controlled at present 13.  Anxiety disorder: Continue Celexa daily 14. Constipation: moved bowels. Feels better  -daily senna-s and miralax    LOS: 2 days A FACE TO FACE EVALUATION WAS PERFORMED  Meredith Staggers 07/17/2019, 10:28 AM

## 2019-07-17 NOTE — Progress Notes (Signed)
Physical Therapy Session Note  Patient Details  Name: EMBERLY TOMASSO MRN: [id number] Date of Birth: 12-23-1928  Today's Date: 07/17/2019 PT Individual Time: 1345-1410 PT Individual Time Calculation (min): 25 min   Short Term Goals: Week 1:  PT Short Term Goal 1 (Week 1): Patient will perform bed mobility with supervision using LRAD. PT Short Term Goal 2 (Week 1): Patient will perform transfers with max A of 1 person. PT Short Term Goal 3 (Week 1): Patient will propel w/c >25 feet without a rest break. PT Short Term Goal 4 (Week 1): Patient will perform sit<>stand while maintaining NWB on R LE with max A of 1 person.  Skilled Therapeutic Interventions/Progress Updates:    pt rec'd from OT.  Pt performs w/c push ups for strength and endurance 3 x 10 with cues for positioning.  kinetron with Lt LE for strengthening to improve standing tolerance 4 x 30 reps.  Sliding board transfer to bed with min/mod A, cues to maintain NWB.  Sit to supine max A and mod A to scoot up to head of bed. Pt left in bed with needs at hand, alarm set.  Therapy Documentation Precautions:  Precautions Precautions: Fall Restrictions Weight Bearing Restrictions: Yes RLE Weight Bearing: Non weight bearing Pain: No c/o pain   Therapy/Group: Individual Therapy  Korissa Horsford 07/17/2019, 2:12 PM

## 2019-07-17 NOTE — Progress Notes (Signed)
Physical Therapy Session Note  Patient Details  Name: Katherine Tyler MRN: [id number] Date of Birth: 21-Jan-1929  Today's Date: 07/17/2019 PT Individual Time: 1100-1155 PT Individual Time Calculation (min): 55 min   Short Term Goals: Week 1:  PT Short Term Goal 1 (Week 1): Patient will perform bed mobility with supervision using LRAD. PT Short Term Goal 2 (Week 1): Patient will perform transfers with max A of 1 person. PT Short Term Goal 3 (Week 1): Patient will propel w/c >25 feet without a rest break. PT Short Term Goal 4 (Week 1): Patient will perform sit<>stand while maintaining NWB on R LE with max A of 1 person.  Skilled Therapeutic Interventions/Progress Updates:    Pt received seated in w/c in room, agreeable to PT session. No complaints of pain. Seated BLE strengthening therex x 10 reps: marches, LAQ, hip add squeeze. 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 Slide board transfer w/c to bed with min A x 1 and mod A x 1. Sit to supine assist x 2. Supine BLE strengthening therex: heel slides, hip abd x 10 reps each. Pt left semi-reclined in bed with needs in reach at end of session.  Therapy Documentation Precautions:  Precautions Precautions: Fall Restrictions Weight Bearing Restrictions: Yes RLE Weight Bearing: Non weight bearing    Therapy/Group: Individual Therapy   Excell Seltzer, PT, DPT  07/17/2019, 12:48 PM

## 2019-07-18 ENCOUNTER — Inpatient Hospital Stay (HOSPITAL_COMMUNITY): Payer: Medicare Other | Admitting: Occupational Therapy

## 2019-07-18 ENCOUNTER — Inpatient Hospital Stay (HOSPITAL_COMMUNITY): Payer: Medicare Other | Admitting: Physical Therapy

## 2019-07-18 NOTE — Progress Notes (Signed)
Physical Therapy Session Note  Patient Details  Name: Katherine Tyler MRN: [id number] Date of Birth: October 05, 1929  Today's Date: 07/18/2019 PT Individual Time: 0945-1100; 1300-1330 PT Individual Time Calculation (min): 75 min and 30 min  Short Term Goals: Week 1:  PT Short Term Goal 1 (Week 1): Patient will perform bed mobility with supervision using LRAD. PT Short Term Goal 2 (Week 1): Patient will perform transfers with max A of 1 person. PT Short Term Goal 3 (Week 1): Patient will propel w/c >25 feet without a rest break. PT Short Term Goal 4 (Week 1): Patient will perform sit<>stand while maintaining NWB on R LE with max A of 1 person.  Skilled Therapeutic Interventions/Progress Updates:    Session 1: Pt received seated in bed, agreeable to PT session. No complaints of pain, pt reports being premedicated prior to start of therapy session. Rolling L/R with min A and use of bedrails to dependently don pants. Supine to sit with min A with HOB elevated and use of bedrails. Sit to stand x 3 reps to RW with mod A increasing to max A with onset of fatigue. Pt demos fair ability to maintain NWBing to RLE with standing. Slide board transfer bed to w/c with min A with assist from a 2nd person to stabilize w/c. Slide board transfer w/c to/from mat table with min A x 1, SBA x 1 for safety. Attempt to have pt perform lateral scoots along edge of therapy mat, pt needs max cueing for correct body postioning and weight shifting with scooting. UBE x 5 min forwards and backwards for global endurance and BUE strengthening. Seated BLE strengthening therex: marches, LAQ x 10-15 reps each. [name] daughter [name] present at end of session, able to confirm pt has had a ramp installed at her home and that family will be there 24/7 with her. Provided [name] with home measurement sheet to fill out and return to therapy. Pt left seated in w/c in room with needs in reach, quick release belt and chair alarm in place, daughter  present.  Session 2: Pt received seated in w/c in room, agreeable to PT session. Pt reports soreness in RLE from sitting up in chair. Slide board transfer w/c to bed with mod A with SBA x 1 to stabilize w/c. Sit to supine mod A for BLE management. Supine BLE strengthening therex with AAROM for RLE: heel slides, hip abd, SLR, hip add squeeze. Pt is mod A to scoot up in bed with use of bed features and pt assisting with BUE and LLE. Pt left semi-reclined in bed with needs in reach, bed alarm in place at end of session.   Therapy Documentation Precautions:  Precautions Precautions: Fall Restrictions Weight Bearing Restrictions: Yes RLE Weight Bearing: Non weight bearing    Therapy/Group: Individual Therapy   Excell Seltzer, PT, DPT  07/18/2019, 12:47 PM

## 2019-07-18 NOTE — Plan of Care (Signed)
  Problem: RH SKIN INTEGRITY Goal: RH STG MAINTAIN SKIN INTEGRITY WITH ASSISTANCE Description: STG Maintain Skin Integrity With Assistance. Outcome: Progressing   Problem: RH SAFETY Goal: RH STG ADHERE TO SAFETY PRECAUTIONS W/ASSISTANCE/DEVICE Description: STG Adhere to Safety Precautions With Mod I Assistance/Device. Outcome: Progressing   Problem: RH PAIN MANAGEMENT Goal: RH STG PAIN MANAGED AT OR BELOW [name] PAIN GOAL Description: Pain < 3  Outcome: Not Progressing   Problem: RH KNOWLEDGE DEFICIT GENERAL Goal: RH STG INCREASE KNOWLEDGE OF SELF CARE AFTER HOSPITALIZATION Description: Mod I Assistance  Outcome: Progressing

## 2019-07-18 NOTE — Progress Notes (Signed)
Occupational Therapy Session Note  Patient Details  Name: Katherine Tyler MRN: [id number] Date of Birth: [birth year]  Today's Date: 07/18/2019 OT Individual Time: 7341-9379 OT Individual Time Calculation (min): 75 min   Short Term Goals: Week 1:  OT Short Term Goal 1 (Week 1): Pt will consistently transfer with +1 assist using LRAD while maintaining WBing pre-cautions OT Short Term Goal 2 (Week 1): Pt will don pants with mod A using AE PRN OT Short Term Goal 3 (Week 1): Pt will consistently complete sit>stand with no more than mod A using LRAD while maintaining WBing pre-cautions during functional task.  Skilled Therapeutic Interventions/Progress Updates:    Pt greeted in bed with dtr Katherine Tyler present. ADL needs met but pt wanted to change into her nightgown. Supine<sit completed with Min A and increased time. Able to state her WB precautions. Pt doffed overhead shirt with supervision and donned her gown with Min A. Opted to keep pants on until we finished with slideboard transfers. Slideboard<w/c completed with Mod A and 2nd helper stabilizing equipment. While sitting at the sink, she completed handwashing with supervision. To work on [employer] strengthening pt self propelled w/c 55 ft towards RN station in straight path. Vcs and manual assist for preventing veering to Rt. OT escorted her remainder of way to the gym to retrieve theraband, and she was returned to room. Worked on lateral leans for another toileting option, using band to simulate pants. Had pt lower theraband while leaning Lt>Rt onto bed and supportive chair. 2nd helper required to fully lower band while pt assisted OT with leaning. Multiple rest breaks required due to fatigue. Slideboard<bed completed with Mod A of 2. Utilized lateral lean technique to lower her pants when EOB. Pt able to assist more this time with lowering fabric and returning to neutral position from elbow. 2 assist still required for meeting task demands and for reciprocal scooting  of hips. 2 assist for returning to bed, however pt able to pull herself up using headboard. Pt was left with dtr and all needs within reach. Bed alarm set.    Therapy Documentation Precautions:  Precautions Precautions: Fall Restrictions Weight Bearing Restrictions: Yes RLE Weight Bearing: Non weight bearing Pain: in R LE near end of session. RN notified to provide pain medicine  Pain Assessment Pain Scale: 0-10 Pain Score: 10-Worst pain ever Pain Type: Surgical pain Pain Location: Leg Pain Orientation: Right Pain Descriptors / Indicators: Aching;Throbbing Pain Frequency: Intermittent Pain Onset: Gradual Pain Intervention(s): Medication (See eMAR);Emotional support ADL:     Therapy/Group: Individual Therapy  Katherine Tyler 07/18/2019, 4:03 PM

## 2019-07-18 NOTE — Progress Notes (Signed)
[place] PHYSICAL MEDICINE & REHABILITATION PROGRESS NOTE   Subjective/Complaints: Eating breakfast. Had a good night sleep. Pain seems controlled  ROS: Patient denies fever, rash, sore throat, blurred vision, nausea, vomiting, diarrhea, cough, shortness of breath or chest pain,  headache, or mood change.   Objective:   No results found. Recent Labs    07/16/19 0759  WBC 10.9*  HGB 9.9*  HCT 30.0*  PLT 192   Recent Labs    07/16/19 0759  NA 133*  K 4.1  CL 95*  CO2 26  GLUCOSE 138*  BUN 22  CREATININE 1.19*  CALCIUM 8.9    Intake/Output Summary (Last 24 hours) at 07/18/2019 0952 Last data filed at 07/18/2019 0920 Gross per 24 hour  Intake 594 ml  Output 850 ml  Net -256 ml     Physical Exam: Vital Signs Blood pressure (!) 147/93, pulse 74, temperature 98.4 F (36.9 C), resp. rate 18, height 5\' 3"  (1.6 m), weight 105.9 kg, SpO2 96 %. Constitutional: No distress . Vital signs reviewed. HEENT: EOMI, oral membranes moist Neck: supple Cardiovascular: IRR without murmur. No JVD    Respiratory: CTA Bilaterally without wheezes or rales. Normal effort    GI: BS +, non-tender, non-distended  Musculoskeletal:        General: Tenderness and edema present in both knees, right ankle    Comments: Right leg in ACE wrap  -valgus deformities at bilateral knees Neurological: She is alert and oriented to person, place, and time.  HOH   Motor: Bilateral upper extremities: 4-/5 proximal distal Right lower extremity: Hip flexion, knee extension 3+/5, wiggles toes without limitation Sensation intact to light touch Left lower extremity: 4/5 proximal to distal --stable Skin: She is not diaphoretic.  Right lower extremity with dressing Psychiatric:  Cooperative, pleasant    Assessment/Plan: 1. Functional deficits secondary to right ankle fx which require 3+ hours per day of interdisciplinary therapy in a comprehensive inpatient rehab setting.  Physiatrist is providing close  team supervision and 24 hour management of active medical problems listed below.  Physiatrist and rehab team continue to assess barriers to discharge/monitor patient progress toward functional and medical goals  Care Tool:  Bathing    Body parts bathed by patient: Right arm, Left arm, Chest, Abdomen, Right upper leg, Left upper leg, Face   Body parts bathed by helper: Front perineal area, Buttocks, Right lower leg, Left lower leg     Bathing assist Assist Level: 2 Helpers     Upper Body Dressing/Undressing Upper body dressing   What is the patient wearing?: Pull over shirt    Upper body assist Assist Level: Moderate Assistance - Patient 50 - 74%    Lower Body Dressing/Undressing Lower body dressing      What is the patient wearing?: Pants, Incontinence brief     Lower body assist Assist for lower body dressing: 2 Helpers     Toileting Toileting    Toileting assist Assist for toileting: 2 Helpers     Transfers Chair/bed transfer  Transfers assist  Chair/bed transfer activity did not occur: Safety/medical concerns  Chair/bed transfer assist level: 2 Helpers     Locomotion Ambulation   Ambulation assist   Ambulation activity did not occur: Safety/medical concerns(decreased strength/endurance, R LE NWB)          Walk 10 feet activity   Assist  Walk 10 feet activity did not occur: Safety/medical concerns(decreased strength/endurance, R LE NWB)        Walk 50 feet  activity   Assist Walk 50 feet with 2 turns activity did not occur: Safety/medical concerns(decreased strength/endurance, R LE NWB)         Walk 150 feet activity   Assist Walk 150 feet activity did not occur: Safety/medical concerns(decreased strength/endurance, R LE NWB)         Walk 10 feet on uneven surface  activity   Assist Walk 10 feet on uneven surfaces activity did not occur: Safety/medical concerns(decreased strength/endurance, R LE NWB)          Wheelchair     Assist Will patient use wheelchair at discharge?: Yes Type of Wheelchair: Manual    Wheelchair assist level: Minimal Assistance - Patient > 75% Max wheelchair distance: 15'    Wheelchair 50 feet with 2 turns activity    Assist    Wheelchair 50 feet with 2 turns activity did not occur: Safety/medical concerns(decreased UE strength/endurance)       Wheelchair 150 feet activity     Assist Wheelchair 150 feet activity did not occur: Safety/medical concerns(decreased UE strength/endurance, R LE NWB)        Medical Problem List and Plan: 1.  Deficits with mobility, transfers, self-care secondary to right ankle fracture.             Continue CIR therapies today including PT and OT  2.  Antithrombotics: -DVT/anticoagulation:  Pharmaceutical: Other (comment) Eliquis             -antiplatelet therapy: N/A 3. Pain Management: Continue oxycodone as needed 4. Mood: Team to provide ego support for chronic anxiety.  LCSW to follow for evaluation and support             -antipsychotic agents: N/A 5. Neuropsych: This patient is not fully capable of making decisions on her own behalf. 6. Skin/Wound Care: Monitor incision for healing daily.  Routine pressure relief measures 7. Fluids/Electrolytes/Nutrition: encourage PO  .continue protein supp for low albumin  -recheck labs monday 8.  Acute on chronic renal failure: IVF stopped              -encourage PO fluids  -sl hyponatremia,otherwise ok 9. Iron deficiency anemia with ABLA: Continue iron supplement             -hgb up to 9.9  10. HTN: Monitor blood pressures and watch for signs of orthostasis.  Continue losartan, diltiazem, metoprolol, and furosemide daily.             bp still borderline---no changes in rx for now 11. Chronic diastolic CHF: Discontinue IV fluids. Continue losartan, metoprolol, furosemide.             Daily weights  -weights trending down. Intake inconsistent, encourage PO Filed Weights    07/16/19 0416 07/17/19 0328 07/18/19 0523  Weight: 108.1 kg 107.1 kg 105.9 kg    12.  PAF: Monitor heart rate TID.  Continue diltiazem and metoprolol.             HR controlled at present 13.  Anxiety disorder: Continue Celexa daily 14. Constipation: moved bowels   -daily senna-s and miralax    LOS: 3 days A FACE TO FACE EVALUATION WAS PERFORMED  Meredith Staggers 07/18/2019, 9:52 AM

## 2019-07-19 MED ORDER — DICLOFENAC SODIUM 1 % TD GEL
2.0000 g | Freq: Three times a day (TID) | TRANSDERMAL | Status: DC
  Administered 2019-07-19 – 2019-08-05 (×47): 2 g via TOPICAL
  Filled 2019-07-19 (×2): qty 100

## 2019-07-19 NOTE — Plan of Care (Signed)
  Problem: Consults Goal: RH GENERAL PATIENT EDUCATION Description: See Patient Education module for education specifics. Outcome: Progressing   Problem: RH SKIN INTEGRITY Goal: RH STG SKIN FREE OF INFECTION/BREAKDOWN Description: Mod I assistance  Outcome: Progressing   Problem: RH SAFETY Goal: RH STG ADHERE TO SAFETY PRECAUTIONS W/ASSISTANCE/DEVICE Description: STG Adhere to Safety Precautions With Mod I Assistance/Device. Outcome: Progressing Goal: RH STG DECREASED RISK OF FALL WITH ASSISTANCE Description: STG Decreased Risk of Fall With Mod I Assistance. Outcome: Progressing   Problem: RH PAIN MANAGEMENT Goal: RH STG PAIN MANAGED AT OR BELOW [name] PAIN GOAL Description: Pain < 3  Outcome: Progressing   Problem: RH KNOWLEDGE DEFICIT GENERAL Goal: RH STG INCREASE KNOWLEDGE OF SELF CARE AFTER HOSPITALIZATION Description: Mod I Assistance  Outcome: Progressing

## 2019-07-19 NOTE — Progress Notes (Signed)
[place] PHYSICAL MEDICINE & REHABILITATION PROGRESS NOTE   Subjective/Complaints: Overall doing well. Reports R>L knee tenderness.   ROS: Patient denies fever, rash, sore throat, blurred vision, nausea, vomiting, diarrhea, cough, shortness of breath or chest pain, joint or back pain, headache, or mood change.    Objective:   No results found. No results for input(s): WBC, HGB, HCT, PLT in the last 72 hours. No results for input(s): NA, K, CL, CO2, GLUCOSE, BUN, CREATININE, CALCIUM in the last 72 hours.  Intake/Output Summary (Last 24 hours) at 07/19/2019 0939 Last data filed at 07/19/2019 0800 Gross per 24 hour  Intake 360 ml  Output 850 ml  Net -490 ml     Physical Exam: Vital Signs Blood pressure (!) 168/62, pulse 70, temperature 98.4 F (36.9 C), temperature source Oral, resp. rate 16, height 5\' 3"  (1.6 m), weight 106 kg, SpO2 95 %. Constitutional: No distress . Vital signs reviewed. HEENT: EOMI, oral membranes moist Neck: supple Cardiovascular: RRR without murmur. No JVD    Respiratory: CTA Bilaterally without wheezes or rales. Normal effort    GI: BS +, non-tender, non-distended   Musculoskeletal:        General: Tenderness and edema present in both knees R>L    Comments: Right leg in ACE wrap  -valgus deformities at bilateral knees Neurological: She is alert and oriented to person, place, and time.  HOH   Motor: Bilateral upper extremities: 4-/5 proximal distal Right lower extremity: Hip flexion, knee extension 3+/5, wiggles toes without limitation Sensation intact to light touch Left lower extremity: 4/5 proximal to distal -no changes Skin: She is not diaphoretic.  Right lower extremity with dressing Psychiatric:  pleasant    Assessment/Plan: 1. Functional deficits secondary to right ankle fx which require 3+ hours per day of interdisciplinary therapy in a comprehensive inpatient rehab setting.  Physiatrist is providing close team supervision and 24 hour  management of active medical problems listed below.  Physiatrist and rehab team continue to assess barriers to discharge/monitor patient progress toward functional and medical goals  Care Tool:  Bathing    Body parts bathed by patient: Right arm, Left arm, Chest, Abdomen, Right upper leg, Left upper leg, Face   Body parts bathed by helper: Front perineal area, Buttocks, Right lower leg, Left lower leg     Bathing assist Assist Level: 2 Helpers     Upper Body Dressing/Undressing Upper body dressing   What is the patient wearing?: Dress    Upper body assist Assist Level: Minimal Assistance - Patient > 75%    Lower Body Dressing/Undressing Lower body dressing      What is the patient wearing?: Pants     Lower body assist Assist for lower body dressing: 2 Helpers     Toileting Toileting Toileting Activity did not occur Press photographer(Clothing management and hygiene only): Refused  Toileting assist Assist for toileting: 2 Helpers     Transfers Chair/bed transfer  Transfers assist  Chair/bed transfer activity did not occur: Safety/medical concerns  Chair/bed transfer assist level: 2 Helpers     Locomotion Ambulation   Ambulation assist   Ambulation activity did not occur: Safety/medical concerns(decreased strength/endurance, R LE NWB)          Walk 10 feet activity   Assist  Walk 10 feet activity did not occur: Safety/medical concerns(decreased strength/endurance, R LE NWB)        Walk 50 feet activity   Assist Walk 50 feet with 2 turns activity did not occur: Safety/medical  concerns(decreased strength/endurance, R LE NWB)         Walk 150 feet activity   Assist Walk 150 feet activity did not occur: Safety/medical concerns(decreased strength/endurance, R LE NWB)         Walk 10 feet on uneven surface  activity   Assist Walk 10 feet on uneven surfaces activity did not occur: Safety/medical concerns(decreased strength/endurance, R LE NWB)          Wheelchair     Assist Will patient use wheelchair at discharge?: Yes Type of Wheelchair: Manual    Wheelchair assist level: Minimal Assistance - Patient > 75% Max wheelchair distance: 15'    Wheelchair 50 feet with 2 turns activity    Assist    Wheelchair 50 feet with 2 turns activity did not occur: Safety/medical concerns(decreased UE strength/endurance)       Wheelchair 150 feet activity     Assist Wheelchair 150 feet activity did not occur: Safety/medical concerns(decreased UE strength/endurance, R LE NWB)        Medical Problem List and Plan: 1.  Deficits with mobility, transfers, self-care secondary to right ankle fracture.             Continue CIR therapies today including PT and OT  2.  Antithrombotics: -DVT/anticoagulation:  Pharmaceutical: Other (comment) Eliquis             -antiplatelet therapy: N/A 3. Pain Management: Continue oxycodone as needed  -add voltaren gel for bilateral knee pain 4. Mood: Team to provide ego support for chronic anxiety.  LCSW to follow for evaluation and support             -antipsychotic agents: N/A 5. Neuropsych: This patient is not fully capable of making decisions on her own behalf. 6. Skin/Wound Care: Monitor incision for healing daily.  Routine pressure relief measures 7. Fluids/Electrolytes/Nutrition: encourage PO  .continue protein supp for low albumin  -recheck labs monday 8.  Acute on chronic renal failure: IVF stopped              -encourage PO fluids  -sl hyponatremia,otherwise ok--check tomorrow 9. Iron deficiency anemia with ABLA: Continue iron supplement             -hgb up to 9.9  10. HTN: Monitor blood pressures and watch for signs of orthostasis.  Continue losartan, diltiazem, metoprolol, and furosemide daily.             bp still borderline---no changes in rx for now 11. Chronic diastolic CHF: Discontinue IV fluids. Continue losartan, metoprolol, furosemide.             Daily weights  -weights  appear generally stable Filed Weights   07/17/19 0328 07/18/19 0523 07/19/19 0409  Weight: 107.1 kg 105.9 kg 106 kg    12.  PAF: Monitor heart rate TID.  Continue diltiazem and metoprolol.             HR controlled at present 13.  Anxiety disorder: Continue Celexa daily 14. Constipation: moving bowels   -daily senna-s and miralax    LOS: 4 days A FACE TO FACE EVALUATION WAS PERFORMED  Meredith Staggers 07/19/2019, 9:39 AM

## 2019-07-20 ENCOUNTER — Inpatient Hospital Stay (HOSPITAL_COMMUNITY): Payer: Medicare Other

## 2019-07-20 ENCOUNTER — Inpatient Hospital Stay (HOSPITAL_COMMUNITY): Payer: Medicare Other | Admitting: Occupational Therapy

## 2019-07-20 LAB — CBC
HCT: 32.6 % — ABNORMAL LOW (ref 36.0–46.0)
Hemoglobin: 10.5 g/dL — ABNORMAL LOW (ref 12.0–15.0)
MCH: 32.1 pg (ref 26.0–34.0)
MCHC: 32.2 g/dL (ref 30.0–36.0)
MCV: 99.7 fL (ref 80.0–100.0)
Platelets: 287 10*3/uL (ref 150–400)
RBC: 3.27 MIL/uL — ABNORMAL LOW (ref 3.87–5.11)
RDW: 15 % (ref 11.5–15.5)
WBC: 9 10*3/uL (ref 4.0–10.5)
nRBC: 0 % (ref 0.0–0.2)

## 2019-07-20 LAB — BASIC METABOLIC PANEL
Anion gap: 15 (ref 5–15)
BUN: 33 mg/dL — ABNORMAL HIGH (ref 8–23)
CO2: 26 mmol/L (ref 22–32)
Calcium: 9.1 mg/dL (ref 8.9–10.3)
Chloride: 95 mmol/L — ABNORMAL LOW (ref 98–111)
Creatinine, Ser: 1.4 mg/dL — ABNORMAL HIGH (ref 0.44–1.00)
GFR calc Af Amer: 39 mL/min — ABNORMAL LOW (ref >60)
GFR calc non Af Amer: 33 mL/min — ABNORMAL LOW (ref >60)
Glucose, Bld: 116 mg/dL — ABNORMAL HIGH (ref 70–99)
Potassium: 3.8 mmol/L (ref 3.5–5.1)
Sodium: 136 mmol/L (ref 135–145)

## 2019-07-20 MED ORDER — ONDANSETRON HCL 4 MG/2ML IJ SOLN
4.0000 mg | Freq: Four times a day (QID) | INTRAMUSCULAR | Status: DC | PRN
  Administered 2019-07-20: 11:00:00 4 mg via INTRAVENOUS
  Filled 2019-07-20: qty 2

## 2019-07-20 MED ORDER — KCL IN DEXTROSE-NACL 20-5-0.9 MEQ/L-%-% IV SOLN
INTRAVENOUS | Status: DC
  Administered 2019-07-20 – 2019-07-21 (×3): via INTRAVENOUS
  Filled 2019-07-20 (×3): qty 1000

## 2019-07-20 MED ORDER — FLEET ENEMA 7-19 GM/118ML RE ENEM
1.0000 | ENEMA | Freq: Once | RECTAL | Status: AC
  Administered 2019-07-20: 15:00:00 1 via RECTAL
  Filled 2019-07-20: qty 1

## 2019-07-20 MED ORDER — DEXTROSE-NACL 5-0.9 % IV SOLN: INTRAVENOUS | Status: DC

## 2019-07-20 MED ORDER — MAGNESIUM CITRATE PO SOLN
1.0000 | ORAL | Status: AC
  Administered 2019-07-20: 1 via ORAL
  Filled 2019-07-20: qty 296

## 2019-07-20 NOTE — Progress Notes (Signed)
Physical Therapy Session Note  Patient Details  Name: Katherine Tyler MRN: [id number] Date of Birth: [birth year]  Today's Date: 07/20/2019 PT Individual Time: 0900-0930 PT Individual Time Calculation (min): 30 min  and Today's Date: 07/20/2019 PT Missed Time: 30 Minutes Missed Time Reason: Patient ill (Comment)(nausea/vomitting)  Short Term Goals: Week 1:  PT Short Term Goal 1 (Week 1): Patient will perform bed mobility with supervision using LRAD. PT Short Term Goal 2 (Week 1): Patient will perform transfers with max A of 1 person. PT Short Term Goal 3 (Week 1): Patient will propel w/c >25 feet without a rest break. PT Short Term Goal 4 (Week 1): Patient will perform sit<>stand while maintaining NWB on R LE with max A of 1 person.  Skilled Therapeutic Interventions/Progress Updates:    Pt supine in bed upon PT arrival, pt denies pain but reports she has been nauseous all morning and has a few episodes of vomiting. Pt declines OOB activity, agreeable to trying some bed level exercises for strengthening. Pt performed 2 x 10 of the following exercises with therapist providing cues for techniques: hip abduction, SLR (assisted R LE), SAQ and heel slides. Therapist performed bilateral hamstring stretch for ROM 2 x 30 sec hold per LE. Pt continued vomiting throughout session, decided to hold off on therapy for remainder of session. Pt missed 30 minutes of skilled therapy tx secondary to nausea/vomitting.   Therapy Documentation Precautions:  Precautions Precautions: Fall Restrictions Weight Bearing Restrictions: Yes RLE Weight Bearing: Non weight bearing   Therapy/Group: Individual Therapy  Netta Corrigan, PT, DPT 07/20/2019, 8:00 AM

## 2019-07-20 NOTE — Progress Notes (Signed)
Pt nauseas at 0227am, this nurse gave her PRN compazine. At 0525am pt began to vomit. Vomited 19mL of green emesis. LBM 08/09, bowel sounds present and abdomen non-tender, non-distended. Will continue to monitor and report off to day shift nurse. Head of bed elevated and call bell at bed side.

## 2019-07-20 NOTE — Progress Notes (Signed)
Occupational Therapy Session Note  Patient Details  Name: Katherine Tyler MRN: [id number] Date of Birth: 1929/10/05  Today's Date: 07/20/2019 OT Individual Time: 2633-3545 OT Individual Time Calculation (min): 9 min  66 minutes missed   Short Term Goals: Week 1:  OT Short Term Goal 1 (Week 1): Pt will consistently transfer with +1 assist using LRAD while maintaining WBing pre-cautions OT Short Term Goal 2 (Week 1): Pt will don pants with mod A using AE PRN OT Short Term Goal 3 (Week 1): Pt will consistently complete sit>stand with no more than mod A using LRAD while maintaining WBing pre-cautions during functional task.  Skilled Therapeutic Interventions/Progress Updates:    Pt greeted in bed with dtr [name] present. Still with n/v. Set pt up to wash her face and hands. Changed bed linens and soiled vomiting towels around pts neck. Brought in new emesis bags. She was left in care of RN staff for NG tube placement. Time missed due to illness.   Therapy Documentation Precautions:  Precautions Precautions: Fall Restrictions Weight Bearing Restrictions: Yes RLE Weight Bearing: Non weight bearing Vital Signs: Therapy Vitals Temp: 98.6 F (37 C) Temp Source: Oral Pulse Rate: (!) 107 Resp: 20 BP: (!) 167/97 Patient Position (if appropriate): Lying Oxygen Therapy SpO2: 93 % O2 Device: Room Air ADL:       Therapy/Group: Individual Therapy  Charlie Seda A Laelle Bridgett 07/20/2019, 3:46 PM

## 2019-07-20 NOTE — Progress Notes (Signed)
[place] PHYSICAL MEDICINE & REHABILITATION PROGRESS NOTE   Subjective/Complaints:   Pt reports nausea and vomiting since this AM- feels very full/constipated even though had a BM 2 days ago.  No matter what she does, spitting up bilious odorous vomit- NOT COFFEE GROUNDS appearance.   Has also had temp of 99.5 and nml WBC. KUB done- found possible distal SBO and focal ileus.  Pt given compazine PO this AM- still vomiting- ordered D5NS IVFs, full liquid diet, and then trying to see if pt can take some Mg Citrate- if not, will place NG tube- however giving her a chance before placing NG tube.  Also ordered Enema this evening after Mg citrate hopefully works. Also gave her order for IV Zofran as well, just in case.   ROS: Patient denies significant fever, rash, Chest pain, SOB- LBM 2 days ago- depends on who I speak to how large it was.  Objective:   Dg Abd 1 View  Result Date: 07/20/2019 CLINICAL DATA:  Nausea, vomiting. EXAM: ABDOMEN - 1 VIEW COMPARISON:  None. FINDINGS: Mildly dilated small bowel loops are noted centrally which may represent focal ileus or possibly distal small bowel obstruction. No colonic dilatation is noted. Stool is noted in the rectum. No radio-opaque calculi or other significant radiographic abnormality are seen. IMPRESSION: Mildly dilated small bowel loops are noted centrally which may represent focal ileus or possibly distal small bowel obstruction. Electronically Signed   By: Marijo Conception M.D.   On: 07/20/2019 10:04   Recent Labs    07/20/19 0738  WBC 9.0  HGB 10.5*  HCT 32.6*  PLT 287   Recent Labs    07/20/19 0738  NA 136  K 3.8  CL 95*  CO2 26  GLUCOSE 116*  BUN 33*  CREATININE 1.40*  CALCIUM 9.1    Intake/Output Summary (Last 24 hours) at 07/20/2019 1321 Last data filed at 07/20/2019 0616 Gross per 24 hour  Intake 240 ml  Output 725 ml  Net -485 ml     Physical Exam: Vital Signs Blood pressure (!) 162/79, pulse 84, temperature  98.6 F (37 C), temperature source Oral, resp. rate 16, height 5\' 3"  (1.6 m), weight 106 kg, SpO2 96 %. Constitutional: Pt seen 2x- 2nd time daughter was in room, pt spitting up dark brownish green (not coffee grounds appearance) vomit every few minutes- small amounts, sitting up in bed, appears nauseated and uncomfortable, no ACUTE distress HEENT: EOMI grossly, dry mucus membranes Cardiovascular: RRR without murmur.   Respiratory: CTA Bilaterally without wheezes or rales. Normal effort    GI: very hypoactive BS- no tinkling, mild/trace TTP diffusely, mainly c/o feeling full, no rebound, distended vs very protuberant obese Musculoskeletal:        General:     Comments: Right leg in ACE wrap  -valgus deformities at bilateral knees Neurological: She is alert and oriented to person, place, and time.  HOH   Motor: Bilateral upper extremities: 4-/5 proximal distal Right lower extremity: Hip flexion, knee extension 3+/5, wiggles toes without limitation Sensation intact to light touch Left lower extremity: 4/5 proximal to distal -no changes Skin: She is not diaphoretic.  Right lower extremity with dressing Psychiatric:  Pleasant but uncomfortable    Assessment/Plan: 1. Functional deficits secondary to right ankle fx which require 3+ hours per day of interdisciplinary therapy in a comprehensive inpatient rehab setting.  Physiatrist is providing close team supervision and 24 hour management of active medical problems listed below.  Physiatrist and rehab  team continue to assess barriers to discharge/monitor patient progress toward functional and medical goals  Care Tool:  Bathing    Body parts bathed by patient: Right arm, Left arm, Chest, Abdomen, Right upper leg, Left upper leg, Face   Body parts bathed by helper: Front perineal area, Buttocks     Bathing assist Assist Level: 2 Helpers     Upper Body Dressing/Undressing Upper body dressing   What is the patient wearing?: Dress     Upper body assist Assist Level: Minimal Assistance - Patient > 75%    Lower Body Dressing/Undressing Lower body dressing      What is the patient wearing?: Pants     Lower body assist Assist for lower body dressing: 2 Helpers     Toileting Toileting Toileting Activity did not occur Press photographer(Clothing management and hygiene only): Safety/medical concerns  Toileting assist Assist for toileting: 2 Helpers     Transfers Chair/bed transfer  Transfers assist  Chair/bed transfer activity did not occur: Safety/medical concerns(nausea/vomitting)  Chair/bed transfer assist level: 2 Helpers     Locomotion Ambulation   Ambulation assist   Ambulation activity did not occur: Safety/medical concerns(decreased strength/endurance, R LE NWB)          Walk 10 feet activity   Assist  Walk 10 feet activity did not occur: Safety/medical concerns(decreased strength/endurance, R LE NWB)        Walk 50 feet activity   Assist Walk 50 feet with 2 turns activity did not occur: Safety/medical concerns(decreased strength/endurance, R LE NWB)         Walk 150 feet activity   Assist Walk 150 feet activity did not occur: Safety/medical concerns(decreased strength/endurance, R LE NWB)         Walk 10 feet on uneven surface  activity   Assist Walk 10 feet on uneven surfaces activity did not occur: Safety/medical concerns(decreased strength/endurance, R LE NWB)         Wheelchair     Assist Will patient use wheelchair at discharge?: Yes Type of Wheelchair: Manual    Wheelchair assist level: Minimal Assistance - Patient > 75% Max wheelchair distance: 15'    Wheelchair 50 feet with 2 turns activity    Assist    Wheelchair 50 feet with 2 turns activity did not occur: Safety/medical concerns(decreased UE strength/endurance)       Wheelchair 150 feet activity     Assist Wheelchair 150 feet activity did not occur: Safety/medical concerns(decreased UE  strength/endurance, R LE NWB)        Medical Problem List and Plan: 1.  Deficits with mobility, transfers, self-care secondary to right ankle fracture.             Continue CIR therapies today including PT and OT  8/10- missed therapy today due to distal SBO/focal ileus 2.  Antithrombotics: -DVT/anticoagulation:  Pharmaceutical: Other (comment) Eliquis             -antiplatelet therapy: N/A 3. Pain Management: Continue oxycodone as needed  -add voltaren gel for bilateral knee pain 4. Mood: Team to provide ego support for chronic anxiety.  LCSW to follow for evaluation and support             -antipsychotic agents: N/A 5. Neuropsych: This patient is not fully capable of making decisions on her own behalf. 6. Skin/Wound Care: Monitor incision for healing daily.  Routine pressure relief measures 7. Fluids/Electrolytes/Nutrition: encourage PO  .continue protein supp for low albumin  -recheck labs Monday  8/10- Will  give D5 IVFs due to pt's not getting anything PO- esp since very dry/constipated/ileus/SBO 8.  Acute on chronic renal failure: IVF stopped              -encourage PO fluids  -sl hyponatremia,otherwise ok--check tomorrow  8/10- Cr up to 1.4 from 1.21- BUN up to 33 from 22- will restart IVFs to rehydrate and monitor labs in AM 9. Iron deficiency anemia with ABLA: Continue iron supplement             -hgb up to 9.9  10. HTN: Monitor blood pressures and watch for signs of orthostasis.  Continue losartan, diltiazem, metoprolol, and furosemide daily.             bp still borderline---no changes in rx for now 11. Chronic diastolic CHF: Discontinue IV fluids. Continue losartan, metoprolol, furosemide.             Daily weights  -weights appear generally stable Filed Weights   07/18/19 0523 07/19/19 0409 07/20/19 0348  Weight: 105.9 kg 106 kg 106 kg    12.  PAF: Monitor heart rate TID.  Continue diltiazem and metoprolol.             HR controlled at present 13.  Anxiety  disorder: Continue Celexa daily 14. Constipation/focal ileus/distal SBO: last BM 2 days ago   -daily senna-s and miralax  8/10- will try Mg citrate dose and enema- if cannot tolerate Mg citrate, will place NG tube, place to intermittent suction, and then give enema tonight- gave IVFs and put on liquid only diet Also gave Zofran IV    LOS: 5 days A FACE TO FACE EVALUATION WAS PERFORMED  Darlen Gledhill 07/20/2019, 1:21 PM

## 2019-07-20 NOTE — Progress Notes (Signed)
Occupational Therapy Note  Patient Details  Name: Katherine Tyler MRN: [id number] Date of Birth: December 18, 1928  Today's Date: 07/20/2019 OT Missed Time: 8 Minutes Missed Time Reason: Patient ill (comment)(Nausea/vomiting)  Pt in bed resting upon arrival. Reports nausea/vomiting throughout the AM and refusing any OOB tx or mobility at this time. Will re-attempt to see as pt able.    Analyah Mcconnon L 07/20/2019, 11:11 AM

## 2019-07-21 ENCOUNTER — Inpatient Hospital Stay (HOSPITAL_COMMUNITY): Payer: Medicare Other | Admitting: Physical Therapy

## 2019-07-21 ENCOUNTER — Inpatient Hospital Stay (HOSPITAL_COMMUNITY): Payer: Medicare Other

## 2019-07-21 ENCOUNTER — Encounter (HOSPITAL_COMMUNITY): Payer: Self-pay | Admitting: Orthopedic Surgery

## 2019-07-21 ENCOUNTER — Inpatient Hospital Stay (HOSPITAL_COMMUNITY): Payer: Medicare Other | Admitting: Occupational Therapy

## 2019-07-21 LAB — CBC WITH DIFFERENTIAL/PLATELET
Abs Immature Granulocytes: 0.04 10*3/uL (ref 0.00–0.07)
Basophils Absolute: 0 10*3/uL (ref 0.0–0.1)
Basophils Relative: 0 %
Eosinophils Absolute: 0.1 10*3/uL (ref 0.0–0.5)
Eosinophils Relative: 1 %
HCT: 28.2 % — ABNORMAL LOW (ref 36.0–46.0)
Hemoglobin: 8.9 g/dL — ABNORMAL LOW (ref 12.0–15.0)
Immature Granulocytes: 1 %
Lymphocytes Relative: 10 %
Lymphs Abs: 0.9 10*3/uL (ref 0.7–4.0)
MCH: 32.2 pg (ref 26.0–34.0)
MCHC: 31.6 g/dL (ref 30.0–36.0)
MCV: 102.2 fL — ABNORMAL HIGH (ref 80.0–100.0)
Monocytes Absolute: 0.8 10*3/uL (ref 0.1–1.0)
Monocytes Relative: 9 %
Neutro Abs: 6.5 10*3/uL (ref 1.7–7.7)
Neutrophils Relative %: 79 %
Platelets: 258 10*3/uL (ref 150–400)
RBC: 2.76 MIL/uL — ABNORMAL LOW (ref 3.87–5.11)
RDW: 15.6 % — ABNORMAL HIGH (ref 11.5–15.5)
WBC: 8.3 10*3/uL (ref 4.0–10.5)
nRBC: 0 % (ref 0.0–0.2)

## 2019-07-21 LAB — COMPREHENSIVE METABOLIC PANEL
ALT: 19 U/L (ref 0–44)
AST: 22 U/L (ref 15–41)
Albumin: 2.5 g/dL — ABNORMAL LOW (ref 3.5–5.0)
Alkaline Phosphatase: 41 U/L (ref 38–126)
Anion gap: 10 (ref 5–15)
BUN: 38 mg/dL — ABNORMAL HIGH (ref 8–23)
CO2: 31 mmol/L (ref 22–32)
Calcium: 8.5 mg/dL — ABNORMAL LOW (ref 8.9–10.3)
Chloride: 98 mmol/L (ref 98–111)
Creatinine, Ser: 1.55 mg/dL — ABNORMAL HIGH (ref 0.44–1.00)
GFR calc Af Amer: 34 mL/min — ABNORMAL LOW (ref >60)
GFR calc non Af Amer: 30 mL/min — ABNORMAL LOW (ref >60)
Glucose, Bld: 128 mg/dL — ABNORMAL HIGH (ref 70–99)
Potassium: 3.9 mmol/L (ref 3.5–5.1)
Sodium: 139 mmol/L (ref 135–145)
Total Bilirubin: 0.7 mg/dL (ref 0.3–1.2)
Total Protein: 5.2 g/dL — ABNORMAL LOW (ref 6.5–8.1)

## 2019-07-21 MED ORDER — METOPROLOL TARTRATE 12.5 MG HALF TABLET
12.5000 mg | ORAL_TABLET | Freq: Two times a day (BID) | ORAL | Status: DC
  Administered 2019-07-22 – 2019-07-31 (×15): 12.5 mg
  Filled 2019-07-21 (×18): qty 1

## 2019-07-21 MED ORDER — DOCUSATE SODIUM 50 MG/5ML PO LIQD
100.0000 mg | Freq: Two times a day (BID) | ORAL | Status: DC
  Administered 2019-07-21 – 2019-08-05 (×22): 100 mg
  Filled 2019-07-21 (×29): qty 10

## 2019-07-21 MED ORDER — DEXTROSE-NACL 5-0.9 % IV SOLN
INTRAVENOUS | Status: AC
  Administered 2019-07-21 (×2): via INTRAVENOUS

## 2019-07-21 MED ORDER — DOCUSATE SODIUM 50 MG/5ML PO LIQD: 100.0000 mg | Freq: Two times a day (BID) | ORAL | Status: DC

## 2019-07-21 MED ORDER — PHENOL 1.4 % MT LIQD
1.0000 | Freq: Three times a day (TID) | OROMUCOSAL | Status: DC
  Administered 2019-07-21 – 2019-08-04 (×39): 1 via OROMUCOSAL
  Filled 2019-07-21: qty 354
  Filled 2019-07-21 (×6): qty 177

## 2019-07-21 MED ORDER — DILTIAZEM HCL 60 MG PO TABS
60.0000 mg | ORAL_TABLET | Freq: Four times a day (QID) | ORAL | Status: DC
  Administered 2019-07-21 – 2019-07-22 (×4): 60 mg
  Filled 2019-07-21 (×3): qty 1

## 2019-07-21 NOTE — Progress Notes (Signed)
[facility] PHYSICAL MEDICINE & REHABILITATION PROGRESS NOTE   Subjective/Complaints:   Pt reports feeling much better s/p NGT that was placed yesterday afternoon. Said her pain was "pretty bad" yesterday, (which she denied yesterday). Has had 3 large BMs since yesterday evening- first with enema- today mostly liquid BM. Less abd pain- now just with BMs- abd feels soft to her, now- was firm yesterday and was nauseated- nausea has now improved as well and vomiting has now stopped.  ROS: Patient denies significant fever, rash, Chest pain, SOB, depression,   Objective:   Dg Abd 1 View  Result Date: 07/20/2019 CLINICAL DATA:  NG tube placement. EXAM: ABDOMEN - 1 VIEW COMPARISON:  07/20/2019 at 9:46 a.m. FINDINGS: Nasogastric tube extends below the diaphragm, tip in the mid to distal stomach. Mildly prominent loops of small bowel are unchanged from the earlier exam. IMPRESSION: Well-positioned nasogastric tube. Electronically Signed   By: Amie Portlandavid  Ormond M.D.   On: 07/20/2019 16:14   Dg Abd 1 View  Result Date: 07/20/2019 CLINICAL DATA:  Nausea, vomiting. EXAM: ABDOMEN - 1 VIEW COMPARISON:  None. FINDINGS: Mildly dilated small bowel loops are noted centrally which may represent focal ileus or possibly distal small bowel obstruction. No colonic dilatation is noted. Stool is noted in the rectum. No radio-opaque calculi or other significant radiographic abnormality are seen. IMPRESSION: Mildly dilated small bowel loops are noted centrally which may represent focal ileus or possibly distal small bowel obstruction. Electronically Signed   By: Lupita RaiderJames  Green Jr M.D.   On: 07/20/2019 10:04   Recent Labs    07/20/19 0738 07/21/19 0545  WBC 9.0 8.3  HGB 10.5* 8.9*  HCT 32.6* 28.2*  PLT 287 258   Recent Labs    07/20/19 0738 07/21/19 0545  NA 136 139  K 3.8 3.9  CL 95* 98  CO2 26 31  GLUCOSE 116* 128*  BUN 33* 38*  CREATININE 1.40* 1.55*  CALCIUM 9.1 8.5*    Intake/Output Summary (Last 24  hours) at 07/21/2019 0935 Last data filed at 07/21/2019 [id number] Gross per 24 hour  Intake 30 ml  Output 1200 ml  Net -1170 ml     Physical Exam: Vital Signs Blood pressure (!) 151/50, pulse 78, temperature 98.2 F (36.8 C), resp. rate 18, height 5\' 3"  (1.6 m), weight 105.2 kg, SpO2 97 %. Constitutional: Pt sitting up in bed with PT and PT tech at bedside, appears brighter and feeling better, per pt, NAD, NG tube in R nare HEENT: EOMI grossly, dry mucus membranes still- tongue dryish, and NG tube in R nare Cardiovascular: RRR without murmur.   Respiratory: CTA Bilaterally without wheezes or rales. Normal effort    GI: very hypoactive BS- no tinkling, mild/trace TTP diffusely, mainly c/o feeling full, no rebound, distended vs very protuberant obese Musculoskeletal:        General:     Comments: Right leg in ACE wrap  -valgus deformities at bilateral knees Neurological: She is alert and oriented to person, place, and time.  HOH   Motor: Bilateral upper extremities: 4-/5 proximal distal Right lower extremity: Hip flexion, knee extension 3+/5, wiggles toes without limitation Sensation intact to light touch Left lower extremity: 4/5 proximal to distal -no changes Skin: She is not diaphoretic. Dry skin Right lower extremity with dressing Psychiatric:  Pleasant , feeling better    Assessment/Plan: 1. Functional deficits secondary to right ankle fx which require 3+ hours per day of interdisciplinary therapy in a comprehensive inpatient rehab setting.  Physiatrist is providing close team supervision and 24 hour management of active medical problems listed below.  Physiatrist and rehab team continue to assess barriers to discharge/monitor patient progress toward functional and medical goals  Care Tool:  Bathing    Body parts bathed by patient: Right arm, Left arm, Chest, Abdomen, Right upper leg, Left upper leg, Face   Body parts bathed by helper: Front perineal area, Buttocks      Bathing assist Assist Level: 2 Helpers     Upper Body Dressing/Undressing Upper body dressing   What is the patient wearing?: Dress    Upper body assist Assist Level: Minimal Assistance - Patient > 75%    Lower Body Dressing/Undressing Lower body dressing      What is the patient wearing?: Pants     Lower body assist Assist for lower body dressing: 2 Helpers     Toileting Toileting Toileting Activity did not occur Landscape architect and hygiene only): Safety/medical concerns  Toileting assist Assist for toileting: 2 Helpers     Transfers Chair/bed transfer  Transfers assist  Chair/bed transfer activity did not occur: Safety/medical concerns(nausea/vomitting)  Chair/bed transfer assist level: 2 Helpers     Locomotion Ambulation   Ambulation assist   Ambulation activity did not occur: Safety/medical concerns(decreased strength/endurance, R LE NWB)          Walk 10 feet activity   Assist  Walk 10 feet activity did not occur: Safety/medical concerns(decreased strength/endurance, R LE NWB)        Walk 50 feet activity   Assist Walk 50 feet with 2 turns activity did not occur: Safety/medical concerns(decreased strength/endurance, R LE NWB)         Walk 150 feet activity   Assist Walk 150 feet activity did not occur: Safety/medical concerns(decreased strength/endurance, R LE NWB)         Walk 10 feet on uneven surface  activity   Assist Walk 10 feet on uneven surfaces activity did not occur: Safety/medical concerns(decreased strength/endurance, R LE NWB)         Wheelchair     Assist Will patient use wheelchair at discharge?: Yes Type of Wheelchair: Manual    Wheelchair assist level: Minimal Assistance - Patient > 75% Max wheelchair distance: 15'    Wheelchair 50 feet with 2 turns activity    Assist    Wheelchair 50 feet with 2 turns activity did not occur: Safety/medical concerns(decreased UE  strength/endurance)       Wheelchair 150 feet activity     Assist Wheelchair 150 feet activity did not occur: Safety/medical concerns(decreased UE strength/endurance, R LE NWB)        Medical Problem List and Plan: 1.  Deficits with mobility, transfers, self-care secondary to right ankle fracture.             Continue CIR therapies today including PT and OT  8/10- missed therapy today due to distal SBO/focal ileus 2.  Antithrombotics: -DVT/anticoagulation:  Pharmaceutical: Other (comment) Eliquis             -antiplatelet therapy: N/A 3. Pain Management: Continue oxycodone as needed  -add voltaren gel for bilateral knee pain 4. Mood: Team to provide ego support for chronic anxiety.  LCSW to follow for evaluation and support             -antipsychotic agents: N/A 5. Neuropsych: This patient is not fully capable of making decisions on her own behalf. 6. Skin/Wound Care: Monitor incision for healing daily.  Routine pressure  relief measures 7. Fluids/Electrolytes/Nutrition: encourage PO  .continue protein supp for low albumin  -recheck labs Monday  8/10- Will give D5 IVFs due to pt's not getting anything PO- esp since very dry/constipated/ileus/SBO  8/11- con't more IVFs 8.  Acute on chronic renal failure: IVF stopped              -encourage PO fluids  -sl hyponatremia,otherwise ok--check tomorrow  8/10- Cr up to 1.4 from 1.21- BUN up to 33 from 22- will restart IVFs to rehydrate and monitor labs in AM  8/11- will recheck labs in AM- Bun up to 38 and Cr 1.55 from 1.4- will do D5NS 100 cc/hour 9. Iron deficiency anemia with ABLA: Continue iron supplement             -hgb up to 9.9  10. HTN: Monitor blood pressures and watch for signs of orthostasis.  Continue losartan, diltiazem, metoprolol, and furosemide daily.             bp still borderline---no changes in rx for now 11. Chronic diastolic CHF: Discontinue IV fluids. Continue losartan, metoprolol, furosemide.              Daily weights  -weights appear generally stable Filed Weights   07/19/19 0409 07/20/19 0348 07/21/19 0500  Weight: 106 kg 106 kg 105.2 kg    12.  PAF: Monitor heart rate TID.  Continue diltiazem and metoprolol.             HR controlled at present 13.  Anxiety disorder: Continue Celexa daily 14. Constipation/focal ileus/distal SBO: last BM 2 days ago   -daily senna-s and miralax  8/10- will try Mg citrate dose and enema- if cannot tolerate Mg citrate, will place NG tube, place to intermittent suction, and then give enema tonight- gave IVFs and put on liquid only diet Also gave Zofran IV  8/11- NG tube placed yesterday afternoon due to continued N/V- feeling better- will recheck KUB to make sure still needs NGT- will con't NGT for now    LOS: 6 days A FACE TO FACE EVALUATION WAS PERFORMED  Albertus Chiarelli 07/21/2019, 9:35 AM

## 2019-07-21 NOTE — Progress Notes (Signed)
Physical Therapy Session Note  Patient Details  Name: Katherine Tyler MRN: [id number] Date of Birth: [birth year]  Today's Date: 07/21/2019 PT Individual Time: 1275-1700; 1415-1530 PT Individual Time Calculation (min): 55 min and 75 min  Short Term Goals: Week 1:  PT Short Term Goal 1 (Week 1): Patient will perform bed mobility with supervision using LRAD. PT Short Term Goal 2 (Week 1): Patient will perform transfers with max A of 1 person. PT Short Term Goal 3 (Week 1): Patient will propel w/c >25 feet without a rest break. PT Short Term Goal 4 (Week 1): Patient will perform sit<>stand while maintaining NWB on R LE with max A of 1 person.  Skilled Therapeutic Interventions/Progress Updates:    Session 1: Pt received seated in bed, agreeable to PT session. Pt reports feeling better than yesterday, decrease overall in nausea and pain. Pt does report some pain in RLE, not rated and declines intervention. Pt with NG tube and declines any OOB mobility this AM, agreeable to bed level therapy. Pt found to be incontinent of stool, see Flowheet for details. Rolling L/R with min A for dependent pericare and brief change. Supine BLE strengthening therex x 15 reps: heel slides, hip abd, SAQ, SLR (AAROM RLE), SKFO, ankle pumps (LLE only). Assist x 2 to scoot up towards [place]. Pt left semi-reclined in bed with needs in reach, bed alarm in place, RLE elevated.  Session 2: Pt received semi-reclined in bed, agreeable to PT session. Pt reports 9/10 pain in RLE, reports she has received pain medication recently and declines any further intervention. Supine to sit with mod A with HOB elevated and use of bedrail. Sit to stand x 4 reps to RW with assist x 2. Pt demonstrates poor ability to maintain NWBing on RLE even with max cueing. Pt unable to keep RLE elevated off of the ground in standing. Seated BLE therex x 15 reps: LAQ, hip add squeeze. Sit to supine assist x 2 for trunk control and BLE management. Assist x 2 to scoot  up in bed. Pt left semi-reclined in bed with needs in reach at end of session. Provided handout to family for where to purchase bedrail as pt will benefit from use of bedrail on bed at home. [name] dtr also provided handout for home measurements, 22x20 w/c will just barely fit through doorways of home which are 31" wide. Will continue to assess best equipment needs for patient.   Therapy Documentation Precautions:  Precautions Precautions: Fall Restrictions Weight Bearing Restrictions: Yes RLE Weight Bearing: Non weight bearing    Therapy/Group: Individual Therapy   Excell Seltzer, PT, DPT  07/21/2019, 8:57 AM

## 2019-07-21 NOTE — Progress Notes (Signed)
Occupational Therapy Session Note  Patient Details  Name: Katherine Tyler MRN: [id number] Date of Birth: 08/01/1929  Today's Date: 07/21/2019 OT Individual Time: 1000-1100 OT Individual Time Calculation (min): 60 min    Short Term Goals: Week 1:  OT Short Term Goal 1 (Week 1): Pt will consistently transfer with +1 assist using LRAD while maintaining WBing pre-cautions OT Short Term Goal 2 (Week 1): Pt will don pants with mod A using AE PRN OT Short Term Goal 3 (Week 1): Pt will consistently complete sit>stand with no more than mod A using LRAD while maintaining WBing pre-cautions during functional task.  Skilled Therapeutic Interventions/Progress Updates:    Pt seen for OT session focusing on ADL re-training and functional transfers. Pt awake in supine upon arrival, agreeable to tx session. Complaints of generalized pain in R LE, reports being pre-medicated prior to tx session and willing to cont without intervention. She transferred to sitting EOB with min A using hospital bed functions and use of chuck pad to assist with advancing hips to EOB, pt able to manage R LE independently.  Completed bathing/dressing routine seated on EOB, assist for reacher L LE (pericare/buttock hygiene not completed  This session). She donned pull over gown with min A to pull down completely. L sock/shoe donned total A. Following rest break, she completed sliding board transfer EOB<> drop arm BSC. Max A +2 for sliding board transfer to [facility]. Manual facilitation and cuing to maintain NWBing. Following extended seated rest break, required total A +2 for transfer back to EOB and to return to supine. Pt exhausted following transfer. Will need to cont to address toileting task in prep for d/c. Pt left in supine with all needs in reach, daughter entering and bed alarm on.   Therapy Documentation Precautions:  Precautions Precautions: Fall Restrictions Weight Bearing Restrictions: Yes RLE Weight Bearing: Non weight  bearing   Therapy/Group: Individual Therapy  [name] L 07/21/2019, 7:03 AM

## 2019-07-22 ENCOUNTER — Inpatient Hospital Stay (HOSPITAL_COMMUNITY): Payer: Medicare Other | Admitting: Physical Therapy

## 2019-07-22 ENCOUNTER — Inpatient Hospital Stay (HOSPITAL_COMMUNITY): Payer: Medicare Other | Admitting: *Deleted

## 2019-07-22 ENCOUNTER — Inpatient Hospital Stay (HOSPITAL_COMMUNITY): Payer: Medicare Other

## 2019-07-22 ENCOUNTER — Inpatient Hospital Stay (HOSPITAL_COMMUNITY): Payer: Medicare Other | Admitting: Occupational Therapy

## 2019-07-22 LAB — CBC WITH DIFFERENTIAL/PLATELET
Abs Immature Granulocytes: 0.03 10*3/uL (ref 0.00–0.07)
Basophils Absolute: 0 10*3/uL (ref 0.0–0.1)
Basophils Relative: 1 %
Eosinophils Absolute: 0.3 10*3/uL (ref 0.0–0.5)
Eosinophils Relative: 5 %
HCT: 27.6 % — ABNORMAL LOW (ref 36.0–46.0)
Hemoglobin: 8.3 g/dL — ABNORMAL LOW (ref 12.0–15.0)
Immature Granulocytes: 1 %
Lymphocytes Relative: 13 %
Lymphs Abs: 0.9 10*3/uL (ref 0.7–4.0)
MCH: 32 pg (ref 26.0–34.0)
MCHC: 30.1 g/dL (ref 30.0–36.0)
MCV: 106.6 fL — ABNORMAL HIGH (ref 80.0–100.0)
Monocytes Absolute: 0.5 10*3/uL (ref 0.1–1.0)
Monocytes Relative: 8 %
Neutro Abs: 4.9 10*3/uL (ref 1.7–7.7)
Neutrophils Relative %: 72 %
Platelets: 249 10*3/uL (ref 150–400)
RBC: 2.59 MIL/uL — ABNORMAL LOW (ref 3.87–5.11)
RDW: 15.6 % — ABNORMAL HIGH (ref 11.5–15.5)
WBC: 6.7 10*3/uL (ref 4.0–10.5)
nRBC: 0 % (ref 0.0–0.2)

## 2019-07-22 LAB — COMPREHENSIVE METABOLIC PANEL
ALT: 19 U/L (ref 0–44)
AST: 22 U/L (ref 15–41)
Albumin: 2.5 g/dL — ABNORMAL LOW (ref 3.5–5.0)
Alkaline Phosphatase: 40 U/L (ref 38–126)
Anion gap: 6 (ref 5–15)
BUN: 28 mg/dL — ABNORMAL HIGH (ref 8–23)
CO2: 31 mmol/L (ref 22–32)
Calcium: 8.3 mg/dL — ABNORMAL LOW (ref 8.9–10.3)
Chloride: 103 mmol/L (ref 98–111)
Creatinine, Ser: 1.37 mg/dL — ABNORMAL HIGH (ref 0.44–1.00)
GFR calc Af Amer: 40 mL/min — ABNORMAL LOW (ref >60)
GFR calc non Af Amer: 34 mL/min — ABNORMAL LOW (ref >60)
Glucose, Bld: 113 mg/dL — ABNORMAL HIGH (ref 70–99)
Potassium: 3.8 mmol/L (ref 3.5–5.1)
Sodium: 140 mmol/L (ref 135–145)
Total Bilirubin: 0.5 mg/dL (ref 0.3–1.2)
Total Protein: 5.2 g/dL — ABNORMAL LOW (ref 6.5–8.1)

## 2019-07-22 MED ORDER — DILTIAZEM HCL 60 MG PO TABS
60.0000 mg | ORAL_TABLET | Freq: Four times a day (QID) | ORAL | Status: DC
  Administered 2019-07-23 – 2019-07-30 (×25): 60 mg via ORAL
  Filled 2019-07-22 (×20): qty 1
  Filled 2019-07-22: qty 2
  Filled 2019-07-22 (×3): qty 1
  Filled 2019-07-22: qty 2
  Filled 2019-07-22: qty 1

## 2019-07-22 NOTE — Progress Notes (Signed)
[facility] PHYSICAL MEDICINE & REHABILITATION PROGRESS NOTE   Subjective/Complaints:   Pt reports feeling better- but was scared of eating even liquid breakfast- had a cannister half full of nasogastric juices but now one just has drops in it. Just working with ice chips only, not really drinking, per pt.  Incontinent of urine per staff/nursing- needing caths and PVRs. IVFs are going  ROS: Patient denies significant fever, rash, Chest pain, SOB, depression,   Objective:   Dg Abd 1 View  Result Date: 07/21/2019 CLINICAL DATA:  Check NG tube EXAM: ABDOMEN - 1 VIEW COMPARISON:  07/20/2019 FINDINGS: Nasogastric catheter is noted in satisfactory position. Scattered large and small bowel air is noted. The overall appearance has improved slightly from the prior exam although a few mildly prominent loops of small bowel remain. No free air is noted. IMPRESSION: Overall improvement in the bowel gas pattern although a few mildly prominent small bowel loops remain. Electronically Signed   By: Inez Catalina M.D.   On: 07/21/2019 13:42   Recent Labs    07/21/19 0545 07/22/19 0511  WBC 8.3 6.7  HGB 8.9* 8.3*  HCT 28.2* 27.6*  PLT 258 249   Recent Labs    07/21/19 0545 07/22/19 0511  NA 139 140  K 3.9 3.8  CL 98 103  CO2 31 31  GLUCOSE 128* 113*  BUN 38* 28*  CREATININE 1.55* 1.37*  CALCIUM 8.5* 8.3*    Intake/Output Summary (Last 24 hours) at 07/22/2019 1711 Last data filed at 07/22/2019 0500 Gross per 24 hour  Intake 1536.32 ml  Output 650 ml  Net 886.32 ml     Physical Exam: Vital Signs Blood pressure (!) 175/53, pulse 72, temperature 98.4 F (36.9 C), temperature source Oral, resp. rate 20, height 5\' 3"  (1.6 m), weight 105.2 kg, SpO2 94 %. Constitutional: Pt sitting up in bed with with no one at bedside, getting IVFs, has NGT in place, brighter affect, NAD; reviewed labs and vitals. HEENT: EOMI grossly, dry mucus membranes still- tongue dryish but better, and NG tube in R  nare Cardiovascular: RRR without murmur.   Respiratory: CTA Bilaterally without wheezes or rales. Normal effort  - no fluid overload heard GI: more normoactive BS-  No TTP, soft, ND, protuberant Musculoskeletal:        General:     Comments: Right leg in ACE wrap  -valgus deformities at bilateral knees Neurological: She is alert and oriented to person, place, and time.  HOH   Motor: Bilateral upper extremities: 4-/5 proximal distal Right lower extremity: Hip flexion, knee extension 3+/5, wiggles toes without limitation Sensation intact to light touch Left lower extremity: 4/5 proximal to distal -no changes Skin: She is not diaphoretic. Dry skin Has some swelling and bruising in wrists Right lower extremity with dressing Psychiatric:  Pleasant  Brighter affect    Assessment/Plan: 1. Functional deficits secondary to right ankle fx which require 3+ hours per day of interdisciplinary therapy in a comprehensive inpatient rehab setting.  Physiatrist is providing close team supervision and 24 hour management of active medical problems listed below.  Physiatrist and rehab team continue to assess barriers to discharge/monitor patient progress toward functional and medical goals  Care Tool:  Bathing    Body parts bathed by patient: Right arm, Left arm, Chest, Abdomen, Right upper leg, Left upper leg, Face   Body parts bathed by helper: Left lower leg, Front perineal area, Buttocks Body parts n/a: Right lower leg   Bathing assist Assist Level:  Moderate Assistance - Patient 50 - 74%     Upper Body Dressing/Undressing Upper body dressing   What is the patient wearing?: Dress    Upper body assist Assist Level: Contact Guard/Touching assist    Lower Body Dressing/Undressing Lower body dressing      What is the patient wearing?: Incontinence brief(dress)     Lower body assist Assist for lower body dressing: 2 Helpers     Toileting Toileting Toileting Activity did not occur  (Clothing management and hygiene only): Safety/medical concerns  Toileting assist Assist for toileting: Moderate Assistance - Patient 50 - 74%     Transfers Chair/bed transfer  Transfers assist  Chair/bed transfer activity did not occur: Safety/medical concerns(nausea/vomitting)  Chair/bed transfer assist level: Dependent - mechanical lift(manual hoyer lift)     Locomotion Ambulation   Ambulation assist   Ambulation activity did not occur: Safety/medical concerns(decreased strength/endurance, R LE NWB)          Walk 10 feet activity   Assist  Walk 10 feet activity did not occur: Safety/medical concerns(decreased strength/endurance, R LE NWB)        Walk 50 feet activity   Assist Walk 50 feet with 2 turns activity did not occur: Safety/medical concerns(decreased strength/endurance, R LE NWB)         Walk 150 feet activity   Assist Walk 150 feet activity did not occur: Safety/medical concerns(decreased strength/endurance, R LE NWB)         Walk 10 feet on uneven surface  activity   Assist Walk 10 feet on uneven surfaces activity did not occur: Safety/medical concerns(decreased strength/endurance, R LE NWB)         Wheelchair     Assist Will patient use wheelchair at discharge?: Yes Type of Wheelchair: Manual    Wheelchair assist level: Minimal Assistance - Patient > 75% Max wheelchair distance: 15'    Wheelchair 50 feet with 2 turns activity    Assist    Wheelchair 50 feet with 2 turns activity did not occur: Safety/medical concerns(decreased UE strength/endurance)       Wheelchair 150 feet activity     Assist Wheelchair 150 feet activity did not occur: Safety/medical concerns(decreased UE strength/endurance, R LE NWB)        Medical Problem List and Plan: 1.  Deficits with mobility, transfers, self-care secondary to right ankle fracture.             Continue CIR therapies today including PT and OT  8/10- missed therapy  today due to distal SBO/focal ileus 2.  Antithrombotics: -DVT/anticoagulation:  Pharmaceutical: Other (comment) Eliquis             -antiplatelet therapy: N/A 3. Pain Management: Continue oxycodone as needed  -add voltaren gel for bilateral knee pain 4. Mood: Team to provide ego support for chronic anxiety.  LCSW to follow for evaluation and support             -antipsychotic agents: N/A 5. Neuropsych: This patient is not fully capable of making decisions on her own behalf. 6. Skin/Wound Care: Monitor incision for healing daily.  Routine pressure relief measures 7. Fluids/Electrolytes/Nutrition: encourage PO  .continue protein supp for low albumin  -recheck labs Monday  8/10- Will give D5 IVFs due to pt's not getting anything PO- esp since very dry/constipated/ileus/SBO  8/11- con't more IVFs  8/12- stop IVFs- will push PO since Renal function is better 8.  Acute on chronic renal failure: IVF stopped              -  encourage PO fluids  -sl hyponatremia,otherwise ok--check tomorrow  8/10- Cr up to 1.4 from 1.21- BUN up to 33 from 22- will restart IVFs to rehydrate and monitor labs in AM  8/11- will recheck labs in AM- Bun up to 38 and Cr 1.55 from 1.4- will do D5NS 100 cc/hour  8/12- BUN dwon to 28- will stop IVFs 9. Iron deficiency anemia with ABLA: Continue iron supplement             -hgb up to 9.9  10. HTN: Monitor blood pressures and watch for signs of orthostasis.  Continue losartan, diltiazem, metoprolol, and furosemide daily.             bp still borderline---no changes in rx for now 11. Chronic diastolic CHF: Discontinue IV fluids. Continue losartan, metoprolol, furosemide.             Daily weights  -weights appear generally stable Filed Weights   07/19/19 0409 07/20/19 0348 07/21/19 0500  Weight: 106 kg 106 kg 105.2 kg    12.  PAF: Monitor heart rate TID.  Continue diltiazem and metoprolol.             HR controlled at present 13.  Anxiety disorder: Continue Celexa  daily 14. Constipation/focal ileus/distal SBO: last BM 2 days ago   -daily senna-s and miralax  8/10- will try Mg citrate dose and enema- if cannot tolerate Mg citrate, will place NG tube, place to intermittent suction, and then give enema tonight- gave IVFs and put on liquid only diet Also gave Zofran IV  8/11- NG tube placed yesterday afternoon due to continued N/V- feeling better- will recheck KUB to make sure still needs NGT- will con't NGT for now   8/12- will check to see if can remove NGT today- if so, con't liquid food until AM and see if tolerates  15. Dispo- team conference today- d/c 8/21  LOS: 7 days A FACE TO FACE EVALUATION WAS PERFORMED  Jarrad Mclees 07/22/2019, 5:11 PM

## 2019-07-22 NOTE — Progress Notes (Signed)
Occupational Therapy Session Note  Patient Details  Name: Katherine Tyler MRN: [id number] Date of Birth: [birth year]  Today's Date: 07/22/2019 OT Individual Time: 1100-1155 OT Individual Time Calculation (min): 55 min    Short Term Goals: Week 1:  OT Short Term Goal 1 (Week 1): Pt will consistently transfer with +1 assist using LRAD while maintaining WBing pre-cautions OT Short Term Goal 2 (Week 1): Pt will don pants with mod A using AE PRN OT Short Term Goal 3 (Week 1): Pt will consistently complete sit>stand with no more than mod A using LRAD while maintaining WBing pre-cautions during functional task. Skilled Therapeutic Interventions/Progress Updates:    Pt seen for OT session focusing on functional mobility, activity tolerance and strengthening. Pt in supine upon arrival, complaints of soreness in R LE, RN made aware. With encouragement, pt willing to participate as able.  Introduced [employer] account planner as possibility for transfer method at home. Pt willing to try. Completed bed mobility with min A using hospital bed functions with assist for management of LE to ensure maintaining of WBing restrictions. +2 assist Hoyer assist transfer to w/c. Pt tolerating well. Max A +2 to reposition hips back into chair.  Attempted to have pt push w/c in hallway, however, due to body habitus and UE ROM limitations pt unable to effectively push w/c.  Pt taken to therapy gym and completed UE strengthening exercises as follows using #2 dowel rod x12 each.  Bicep curl  Hammer curl  Chest press  Overhead press Multi-modal cuing and min A (overhead press only) for full ROM and proper form and technique.   Pt returned to room at end of session, encouraged to stay sitting up in w/c to build OOB tolerance. Pt agreeable. Left with all needs in reach. RN made aware of pt's position.   Discussed with pt DME and d/c planning throughout session. Pt likely to require [facility] lift and hospital bed at d/c. Home measurement  sheet provided and portions of house will not be accessible to her. Discussed concerns and set-up needs with CSW who will speak with pt's daughter.   Therapy Documentation Precautions:  Precautions Precautions: Fall Restrictions Weight Bearing Restrictions: Yes RLE Weight Bearing: Non weight bearing   Therapy/Group: Individual Therapy  Neng Albee L 07/22/2019, 7:04 AM

## 2019-07-22 NOTE — Progress Notes (Signed)
Physical Therapy Session Note  Patient Details  Name: Katherine Tyler MRN: [id number] Date of Birth: 1929-01-13  Today's Date: 07/22/2019 PT Individual Time: 1300-1400 PT Individual Time Calculation (min): 60 min   Short Term Goals: Week 1:  PT Short Term Goal 1 (Week 1): Patient will perform bed mobility with supervision using LRAD. PT Short Term Goal 2 (Week 1): Patient will perform transfers with max A of 1 person. PT Short Term Goal 3 (Week 1): Patient will propel w/c >25 feet without a rest break. PT Short Term Goal 4 (Week 1): Patient will perform sit<>stand while maintaining NWB on R LE with max A of 1 person.  Skilled Therapeutic Interventions/Progress Updates:    Pt received seated in w/c in room finishing her jello. Pt reports feeling very fatigued this PM and is agreeable to return to bed. Manual hoyer transfer w/c to bed to simulate best transfer to utilize upon d/c home. Rolling L/R with min A and use of bedrails for removal of hoyer sling. [name] NG tube has leaked all over her nightgown during transfer. Supine to long-sitting in bed with use of BUE on bedrails and assist x 2 to change nightgown with max to total A. Supine BLE strengthening therex: heel slides, hip abd, SAQ x 15 reps B. Pt exhibits improved ability to perform RLE therex without active-assist. Pt left semi-reclined in bed with needs in reach at end of session.  Therapy Documentation Precautions:  Precautions Precautions: Fall Restrictions Weight Bearing Restrictions: Yes RLE Weight Bearing: Non weight bearing    Therapy/Group: Individual Therapy   Excell Seltzer, PT, DPT  07/22/2019, 3:50 PM

## 2019-07-22 NOTE — Progress Notes (Signed)
Occupational Therapy Session Note  Patient Details  Name: Katherine Tyler MRN: [id number] Date of Birth: 28-Apr-1929  Today's Date: 07/22/2019 OT Individual Time: 2500-3704 OT Individual Time Calculation (min): 55 min    Short Term Goals: Week 1:  OT Short Term Goal 1 (Week 1): Pt will consistently transfer with +1 assist using LRAD while maintaining WBing pre-cautions OT Short Term Goal 2 (Week 1): Pt will don pants with mod A using AE PRN OT Short Term Goal 3 (Week 1): Pt will consistently complete sit>stand with no more than mod A using LRAD while maintaining WBing pre-cautions during functional task.  Skilled Therapeutic Interventions/Progress Updates:    1:1. Pt received in bed with RN delivering medicaiton. Pain only "a little" in RLE. Pt supine>sitting EOB iwht heavy use of bed rails with MOD A for trunk elevation and use of bed pad to scoot efficiently. Pt remains EOB for most of session completing bathing with A for L foot/shin, back and buttocks (in supine rolling-min A). Pt sits EOB while OT washes hair with no UE or LE supprt to challenge balance with no LOB. Pt combs 60% of hair, however requires A to reach top and back of head d/t shoulder ROM deficits. Pt completes oral care with A to untwist cap on toothpaste. Pt dons gown with A to pull fully down back and OT dons brief total A rolling B with min A. Exited session with pt seated in bed, exit alarm on and call light in reach  Therapy Documentation Precautions:  Precautions Precautions: Fall Restrictions Weight Bearing Restrictions: Yes RLE Weight Bearing: Non weight bearing General:   Vital Signs: Therapy Vitals Temp: 98.1 F (36.7 C) Temp Source: Oral Pulse Rate: 67 Resp: 18 BP: (!) 167/59 Patient Position (if appropriate): Sitting Oxygen Therapy SpO2: 93 % O2 Device: Room Air Pain:   ADL:   Vision   Perception    Praxis   Exercises:   Other Treatments:     Therapy/Group: Individual Therapy  Tonny Branch 07/22/2019, 8:56 AM

## 2019-07-23 ENCOUNTER — Inpatient Hospital Stay (HOSPITAL_COMMUNITY): Payer: Medicare Other | Admitting: Occupational Therapy

## 2019-07-23 ENCOUNTER — Inpatient Hospital Stay (HOSPITAL_COMMUNITY): Payer: Medicare Other | Admitting: Physical Therapy

## 2019-07-23 DIAGNOSIS — R0989 Other specified symptoms and signs involving the circulatory and respiratory systems: Secondary | ICD-10-CM | POA: Insufficient documentation

## 2019-07-23 NOTE — Plan of Care (Signed)
Downgraded bed to chair transfer goal from min A to dependent via mechanical lift due to slow progress. Discharged car transfer goal due to slow progress.

## 2019-07-23 NOTE — Progress Notes (Addendum)
[facility] PHYSICAL MEDICINE & REHABILITATION PROGRESS NOTE   Subjective/Complaints: Patient seen sitting up in bed this morning.  She states she slept well overnight.  ROS: Denies CP, SOB, N/V/D  Objective:   No results found. Recent Labs    07/21/19 0545 07/22/19 0511  WBC 8.3 6.7  HGB 8.9* 8.3*  HCT 28.2* 27.6*  PLT 258 249   Recent Labs    07/21/19 0545 07/22/19 0511  NA 139 140  K 3.9 3.8  CL 98 103  CO2 31 31  GLUCOSE 128* 113*  BUN 38* 28*  CREATININE 1.55* 1.37*  CALCIUM 8.5* 8.3*    Intake/Output Summary (Last 24 hours) at 07/23/2019 1524 Last data filed at 07/23/2019 0700 Gross per 24 hour  Intake 390 ml  Output -  Net 390 ml     Physical Exam: Vital Signs Blood pressure (!) 154/43, pulse 64, temperature 98.4 F (36.9 C), temperature source Oral, resp. rate 20, height 5\' 3"  (1.6 m), weight 105.2 kg, SpO2 95 %. Constitutional: No distress . Vital signs reviewed. HENT: Normocephalic.  Atraumatic. Eyes: EOMI. No discharge. Cardiovascular: No JVD. Respiratory: Normal effort.  No stridor. GI: Non-distended. Musculoskeletal:    R>L LE edema Neurological: Alert HOH Motor: Bilateral upper extremities: 4-/5 proximal distal, stable Right lower extremity: Hip flexion, knee extension 4/5, wiggles toes without limitation  Left lower extremity: 4-4+/5 proximal to distal  Skin: Right foot with dressing c/d/i Psychiatric: Normal mood. Normal behavior.  Assessment/Plan: 1. Functional deficits secondary to right ankle fx which require 3+ hours per day of interdisciplinary therapy in a comprehensive inpatient rehab setting.  Physiatrist is providing close team supervision and 24 hour management of active medical problems listed below.  Physiatrist and rehab team continue to assess barriers to discharge/monitor patient progress toward functional and medical goals  Care Tool:  Bathing    Body parts bathed by patient: Right arm, Left arm, Chest, Abdomen,  Face   Body parts bathed by helper: Left lower leg, Front perineal area, Buttocks Body parts n/a: Right lower leg   Bathing assist Assist Level: Minimal Assistance - Patient > 75%     Upper Body Dressing/Undressing Upper body dressing   What is the patient wearing?: Pull over shirt    Upper body assist Assist Level: Minimal Assistance - Patient > 75%    Lower Body Dressing/Undressing Lower body dressing      What is the patient wearing?: Pants     Lower body assist Assist for lower body dressing: 2 Helpers     Toileting Toileting Toileting Activity did not occur (Clothing management and hygiene only): Safety/medical concerns  Toileting assist Assist for toileting: Moderate Assistance - Patient 50 - 74%     Transfers Chair/bed transfer  Transfers assist  Chair/bed transfer activity did not occur: Safety/medical concerns(nausea/vomitting)  Chair/bed transfer assist level: Maximal Assistance - Patient 25 - 49%     Locomotion Ambulation   Ambulation assist   Ambulation activity did not occur: Safety/medical concerns(decreased strength/endurance, R LE NWB)          Walk 10 feet activity   Assist  Walk 10 feet activity did not occur: Safety/medical concerns(decreased strength/endurance, R LE NWB)        Walk 50 feet activity   Assist Walk 50 feet with 2 turns activity did not occur: Safety/medical concerns(decreased strength/endurance, R LE NWB)         Walk 150 feet activity   Assist Walk 150 feet activity did not occur: Safety/medical  concerns(decreased strength/endurance, R LE NWB)         Walk 10 feet on uneven surface  activity   Assist Walk 10 feet on uneven surfaces activity did not occur: Safety/medical concerns(decreased strength/endurance, R LE NWB)         Wheelchair     Assist Will patient use wheelchair at discharge?: Yes Type of Wheelchair: Manual    Wheelchair assist level: Minimal Assistance - Patient >  75% Max wheelchair distance: 1315'    Wheelchair 50 feet with 2 turns activity    Assist    Wheelchair 50 feet with 2 turns activity did not occur: Safety/medical concerns(decreased UE strength/endurance)       Wheelchair 150 feet activity     Assist Wheelchair 150 feet activity did not occur: Safety/medical concerns(decreased UE strength/endurance, R LE NWB)        Medical Problem List and Plan: 1.  Deficits with mobility, transfers, self-care secondary to right ankle fracture.  Cont CIR 2.  Antithrombotics: -DVT/anticoagulation:  Pharmaceutical: Other (comment) Eliquis             -antiplatelet therapy: N/A 3. Pain Management: Continue oxycodone as needed  -add voltaren gel for bilateral knee pain 4. Mood: Team to provide ego support for chronic anxiety.  LCSW to follow for evaluation and support             -antipsychotic agents: N/A 5. Neuropsych: This patient is not fully capable of making decisions on her own behalf. 6. Skin/Wound Care: Monitor incision for healing daily.  Routine pressure relief measures 7. Fluids/Electrolytes/Nutrition: encourage PO  Continue protein supp for low albumin 8.  Acute on chronic renal failure: IVF stopped              -encourage PO fluids  -sl hyponatremia,otherwise ok--check tomorrow  8/12- BUN improved, IVF d/ced 9. Iron deficiency anemia with ABLA: Continue iron supplement             Hemoglobin 8.3 on 8/12, labs ordered for tomorrow 10. HTN: Monitor blood pressures and watch for signs of orthostasis.  Continue losartan, diltiazem, metoprolol, and furosemide daily.  Slightly labile, but elevated on 8/13, consider medication adjustments at persistently elevated 11. Chronic diastolic CHF: Discontinue IV fluids. Continue losartan, metoprolol, furosemide.             Daily weights Filed Weights   07/19/19 0409 07/20/19 0348 07/21/19 0500  Weight: 106 kg 106 kg 105.2 kg    Stable on 8/13 12.  PAF: Monitor heart rate TID.   Continue diltiazem and metoprolol.             HR controlled at present on 8/13 13.  Anxiety disorder: Continue Celexa daily 14. Constipation/focal ileus/distal SBO:   -daily senna-s and miralax  Improving, NG DC'd  15. Dispo- team conference today- d/c 8/21  LOS: 8 days A FACE TO FACE EVALUATION WAS PERFORMED  Bailen Geffre Karis Jubanil Jasai Sorg 07/23/2019, 3:24 PM

## 2019-07-23 NOTE — Patient Care Conference (Signed)
Inpatient RehabilitationTeam Conference and Plan of Care Update Date: 07/22/2019   Time: 9:57 AM    Patient Name: Katherine Tyler      Medical Record Number: [id number]  Date of Birth: March 21, 1929 Sex: Female         Room/Bed: 4W07C/4W07C-01 Payor Info: Payor: MEDICARE / Plan: MEDICARE PART A AND B / Product Type: *No Product type* /    Admitting Diagnosis: 3. SCI Team  Other Ortho; RT. Tib Fib FX; 15-17days  Admit Date/Time:  07/15/2019  4:38 PM Admission Comments: No comment available   Primary Diagnosis:  Closed fracture of right fibula and tibia, sequela Principal Problem: Closed fracture of right fibula and tibia, sequela  Patient Active Problem List   Diagnosis Date Noted  . Closed fracture of right fibula and tibia, sequela 07/15/2019  . Acute blood loss anemia   . AKI (acute kidney injury) ([place])   . Fracture of tibial shaft, open 07/12/2019  . PAF (paroxysmal atrial fibrillation) ([place])   . Obesity   . Hypertension   . Hyperlipidemia   . GERD (gastroesophageal reflux disease)   . Hiatal hernia   . Arthritis   . Anxiety   . Chronic diastolic CHF (congestive heart failure) ([place])   . Current use of long term anticoagulation 01/31/2018  . Chronic diastolic heart failure ([place]) 01/31/2018  . A-fib ([place]) 01/16/2018  . CHF (congestive heart failure) ([place]) 01/14/2018  . Hyperkalemia 01/14/2018  . Iron deficiency anemia 10/01/2017  . Osteoarthritis 09/24/2016  . Essential hypertension   . Hyponatremia 06/18/2016  . Hepatic cirrhosis ([place]) 06/18/2016  . Hypokalemia 06/18/2016  . Metabolic syndrome 40/98/1191  . Obesity (BMI 30-39.9) 09/12/2015  . GAD (generalized anxiety disorder) 05/03/2015  . Varicose veins 06/24/2013  . Seasonal allergic rhinitis 04/30/2013  . Vitamin D deficiency 04/16/2013  . Hypothyroidism 04/16/2013  . Genu valgum (acquired) 04/16/2013    Expected Discharge Date: Expected Discharge Date: 07/31/19  Team Members Present: Physician leading conference:  Dr. Courtney Heys Social Worker Present: Lennart Pall, LCSW Nurse Present: Blair Heys, RN PT Present: Excell Seltzer, PT OT Present: Amy Rounds, OT PPS Coordinator present : Gunnar Fusi, Novella Olive, PT     Current Status/Progress Goal Weekly Team Focus  Medical   SBO, ileus, s/p NG tube for SBO- current, R tibia and fibular fracture, NWB on RLE, morbid obesity  improve ability to withstand NWB on RLE and resolve SBO  Resolve SBO- get out NG tube   Bowel/Bladder   incontinent of b/b, I/O for PVR >327ml Q4-6hr; LBM: 08/11, NG tube in place  Time tolieting while up  assist with tolieting needs prn   Swallow/Nutrition/ Hydration             ADL's   +2 sliding board transfers, +2 LB dressing from EOB sit>stand, +2 total A toileting  Min A overall, may downgrade due to pt progress.  ADL re-training, functional transfers, general strengthening and activity tolerance   Mobility   min A rolling, mod A to assist x 2 supine to/from sit, mod A x 1-2 SB transfers, min A w/c mobility x 15'  min A overall  adherence to [place] precautions, transfers, w/c mobility   Communication             Safety/Cognition/ Behavioral Observations            Pain   c/o to R LE; prn oxycodone q6hr  pain level <4/10  assess pain level QS and prn   Skin  Incision to R ankle with soft cast; MASD to groin, MCG powder; ecchymosis  Remain free of new skin breakdown/infection  assess skin QS and prn    Rehab Goals Patient on target to meet rehab goals: Yes *See Care Plan and progress notes for long and short-term goals.     Barriers to Discharge  Current Status/Progress Possible Resolutions Date Resolved   Physician    Incontinence;Medical stability;Weight;Weight bearing restrictions;Medication compliance;Other (comments)  SBO, severe constipation, NG tube-  basically max assist to total assist for everything- due to not being to maintain NWB on RLE  improve UB strength, but will be difficult; also need to  get NGT out, and treat acute on chronic renal issues      Nursing                  PT  Home environment access/layout;Weight bearing restrictions;Other (comments)  difficulty maintaining R LE NWB; difficulty with w/c propulsion 2/2 body habitus              OT                  SLP                SW                Discharge Planning/Teaching Needs:  Home with family providing 24/7 assistance.  Teaching has begun and will be ongoing.   Team Discussion:  Focal ileus and SBO - NG - KUB and hope to remove NG today.  Cont / incont.  Encourage push fluids.  Limited progress.  Min-mod bed mobility.  +2 for sliding board tfs.  Cannot maintian WB restrictions and poor tolerance with tfs.  Anticipate downgrading of goals to overall mod assist w/c level and recommend use of hoyer lift for transfers.  Revisions to Treatment Plan:  NA    Continued Need for Acute Rehabilitation Level of Care: The patient requires daily medical management by a physician with specialized training in physical medicine and rehabilitation for the following conditions: Daily direction of a multidisciplinary physical rehabilitation program to ensure safe treatment while eliciting the highest outcome that is of practical value to the patient.: Yes Daily medical management of patient stability for increased activity during participation in an intensive rehabilitation regime.: Yes Daily analysis of laboratory values and/or radiology reports with any subsequent need for medication adjustment of medical intervention for : Post surgical problems;Wound care problems;Renal problems;Nutritional problems   I attest that I was present, lead the team conference, and concur with the assessment and plan of the team.   Syvanna Ciolino 07/23/2019, 11:30 AM     Team conference was held via web/ teleconference due to COVID - 19

## 2019-07-23 NOTE — Progress Notes (Signed)
Physical Therapy Weekly Progress Note  Patient Details  Name: Katherine Tyler MRN: [id number] Date of Birth: 1929-03-14  Beginning of progress report period: July 16, 2019 End of progress report period: July 23, 2019  Today's Date: 07/23/2019 PT Individual Time: 1300-1410 PT Individual Time Calculation (min): 70 min   Patient has met 1 of 4 short term goals.  Pt has made very slow progress over the past week and has been limited by SBO earlier in the week. Pt is currently min A for rolling but anywhere from min A to assist x 2 for supine to/from sit. Pt is able to stand up to RW from elevated bed with max A but is unable to maintain WBing precautions on RLE. Pt is able to perform slide board transfer with assist x 2 but again is unable to maintain [place] precautions. Pt is able to perform w/c mobility up to 40 ft with increased time and Supervision needed. Initiated Copywriter, advertising transfer this week as this will likely be the safest method for patient's family to transfer her and assist her with mobility.   Patient continues to demonstrate the following deficits muscle weakness, decreased problem solving, decreased safety awareness and decreased memory and decreased standing balance, decreased postural control, decreased balance strategies and difficulty maintaining precautions and therefore will continue to benefit from skilled PT intervention to increase functional independence with mobility.  Patient not progressing toward long term goals.  See goal revision..  Plan of care revisions: Downgraded bed to chair transfer goal from min A to dependent with mechanical lift due to slow progress. Discharged car transfer goal due to slow progress..  PT Short Term Goals Week 1:  PT Short Term Goal 1 (Week 1): Patient will perform bed mobility with supervision using LRAD. PT Short Term Goal 1 - Progress (Week 1): Not met PT Short Term Goal 2 (Week 1): Patient will perform transfers with max A of 1 person. PT  Short Term Goal 2 - Progress (Week 1): Not met PT Short Term Goal 3 (Week 1): Patient will propel w/c >25 feet without a rest break. PT Short Term Goal 3 - Progress (Week 1): Met PT Short Term Goal 4 (Week 1): Patient will perform sit<>stand while maintaining NWB on R LE with max A of 1 person. PT Short Term Goal 4 - Progress (Week 1): Not met Week 2:  PT Short Term Goal 1 (Week 2): =LTG due to ELOS  Skilled Therapeutic Interventions/Progress Updates:  Pt received seated in bed, agreeable to PT session. No complaints of pain. Pt agreeable to get up to wheelchair. Rolling L/R with min A for placement of manual hoyer sling. Manual hoyer transfer bed to w/c, reviewed how to place sling and safely perform transfer and setup equipment with patient's daughter [name]. Manual w/c propulsion x 40 ft with use of BUE and Supervision with increased time and several rest breaks needed to complete. Removed arm rests during w/c propulsion for improved body mechanics. Adjusted RLE ELR for improved fit and support for patient. Pt fits snugly into 20x20 w/c but exhibits improved ability to perform w/c mobility due to body habitus in this size chair. Manual hoyer transfer w/c to bed. Supine to sit with mod A with use of bedrails and HOB elevated. Sit to stand x 5 reps from elevated bed to RW with min A x 2 progressing to mod A x 1. Pt demos improved ability to maintain NWBing precautions on RLE with standing. Sit to supine mod  A for BLE management. Pt left seated in bed with needs in reach at end of session, daughter present.  Therapy Documentation Precautions:  Precautions Precautions: Fall Restrictions Weight Bearing Restrictions: Yes RLE Weight Bearing: Non weight bearing   Therapy/Group: Individual Therapy   Excell Seltzer, PT, DPT  07/23/2019, 3:34 PM

## 2019-07-23 NOTE — Plan of Care (Signed)
Standing goals and functional transfer goals d/c. Pt unable to stand while maintaining R NWBing pre-cautions and is not functional at this time. Will recommend use of manual Hoyer lift for functional transfers at d/c. See POC for goal details. Cole [place]ridge, OTR/L

## 2019-07-23 NOTE — Plan of Care (Signed)
  Problem: Consults Goal: RH GENERAL PATIENT EDUCATION Description: See Patient Education module for education specifics. Outcome: Progressing   Problem: RH SKIN INTEGRITY Goal: RH STG SKIN FREE OF INFECTION/BREAKDOWN Description: min assistance  Outcome: Progressing Goal: RH STG MAINTAIN SKIN INTEGRITY WITH ASSISTANCE Description: STG Maintain Skin Integrity With min Assistance. Outcome: Progressing   Problem: RH SAFETY Goal: RH STG ADHERE TO SAFETY PRECAUTIONS W/ASSISTANCE/DEVICE Description: STG Adhere to Safety Precautions With supervisionAssistance/Device. Outcome: Progressing Goal: RH STG DECREASED RISK OF FALL WITH ASSISTANCE Description: STG Decreased Risk of Fall With supervision Assistance. Outcome: Progressing   Problem: RH PAIN MANAGEMENT Goal: RH STG PAIN MANAGED AT OR BELOW [name] PAIN GOAL Description: Pain < 3  Outcome: Progressing   Problem: RH KNOWLEDGE DEFICIT GENERAL Goal: RH STG INCREASE KNOWLEDGE OF SELF CARE AFTER HOSPITALIZATION Description: Min assistance with transfers, able to verbalize fall precautions and safety ideas for home Outcome: Progressing

## 2019-07-23 NOTE — Progress Notes (Signed)
Occupational Therapy Weekly Progress Note  Patient Details  Name: Katherine Tyler MRN: [id number] Date of Birth: 02/12/1929  Beginning of progress report period: July 16, 2019 End of progress report period: July 23, 2019  Today's Date: 07/23/2019 OT Individual Time: 1000-1100 OT Individual Time Calculation (min): 60 min    Patient has met 0 of 3 short term goals.  Pt making very limited progress towards OT goals. Pt cont to require +2 max-total A for functional mobility. Initially trialed sliding board transfers, however, due to pt's body habitus and inability to maintain [place] during transfer, she consistently requires max-total A +2 for transfer and with significant fatigue following transfer. Therefore, have initiated use of manual Hoyer lift as best option for home use in order to decrease burden and of care and for increased safety.  Pt is completing bathing/dressing routine from EOB with min-mod A for bathing and min A to don night gown, pt does not wear pants.   Patient continues to demonstrate the following deficits: muscle weakness, decreased cardiorespiratoy endurance and decreased standing balance, decreased postural control, decreased balance strategies and difficulty maintaining precautions.and therefore will continue to benefit from skilled OT intervention to enhance overall performance with BADL and Reduce care partner burden.  Patient not progressing toward long term goals.  See goal revision..  Plan of care revisions: Dynamic standing balance, toileting, LB dressing and toilet transfer goals d/c. Pt will require total A at bed level for these tasks and recommending use of manual Hoyer lift at d/c for functional transfers. See POC for goal details. .  OT Short Term Goals Week 1:  OT Short Term Goal 1 (Week 1): Pt will consistently transfer with +1 assist using LRAD while maintaining WBing pre-cautions OT Short Term Goal 1 - Progress (Week 1): Not met OT Short Term Goal 2 (Week  1): Pt will don pants with mod A using AE PRN OT Short Term Goal 2 - Progress (Week 1): Not met OT Short Term Goal 3 (Week 1): Pt will consistently complete sit>stand with no more than mod A using LRAD while maintaining WBing pre-cautions during functional task. OT Short Term Goal 3 - Progress (Week 1): Not met Week 2:  OT Short Term Goal 1 (Week 2): STG=LTG due to LOS  Skilled Therapeutic Interventions/Progress Updates:    Pt seen for OT seesion focusing on caregiver training with pt's daughter and training for use of Hoyer lift. Pt in supine upon arrival with daughter, [name] present. Pt agreeable to tx session and daughter willing to participate in family education. Extensive education provided regarding pt's CLOF, modified ADLs being completed, and recommendations for ADLs and mobility at d/c, continuum of care, DME,and d/c planning. . Recommending bed level ADLs with use of hospital bed and [facility] lift for functional transfers. Dasughter voiced understanding with recommendations. Demonstration provided by Therapist for [facility] lift transfer supine> w/c. [name] daughter then provided the +2 assist for w/c>supine transfer utilizing Hoyer lift with min cuing from therapist for technique. Performed 3rd Hoyer lift transfer to return to w/c with Environmental health practitioner.  Retrieved 20" wide w/c to attempt narrower w/c for increased accessiblity within the home. Pt able to fit, however, will see how pt tolerates for more extended time in w/c OOB.  Pt left sitting in w/c at end of session, education provided regarding benefits of OOB and pt encouraged to stay sitting up in w/c until PT session 2 hours following end of OT session. Pt willing to attempt.  Therapy Documentation Precautions:  Precautions Precautions: Fall Restrictions Weight Bearing Restrictions: Yes RLE Weight Bearing: Non weight bearing   Therapy/Group: Individual Therapy  Aily Tzeng L 07/23/2019, 6:57 AM

## 2019-07-23 NOTE — Progress Notes (Signed)
Occupational Therapy Session Note  Patient Details  Name: Katherine Tyler MRN: [id number] Date of Birth: 1929-03-05  Today's Date: 07/23/2019 OT Individual Time: 0947-0962 OT Individual Time Calculation (min): 57 min    Short Term Goals: Week 2:  OT Short Term Goal 1 (Week 2): STG=LTG due to LOS  Skilled Therapeutic Interventions/Progress Updates:    Treatment session with focus on ADL retraining, bed mobility, and functional transfers. Pt received supine in bed with RN providing pain meds and gel to knees.  Pt completed bed mobility to come to sitting at EOB with min assist and use of bed rails.  Engaged in [place] bathing/dressing seated at EOB with setup for items and min assist to adjust shirt over back.  Total assist to thread BLE through pant legs, pt able to recall need to thread RLE first.  Returned to supine to pull pants over hips while rolling Rt and Lt at bed level.  +2 assist for LB dressing.  Engaged in transfer training with slide board, pt able to complete transfer to Lt with mod assist and 2nd person present for safety but not physically assisting.  Max assist transfer faded to mod when transferring back to Rt.  Cues for NWB through RLE during transfers and when rolling at bed level.  Pt remained semi-reclined in bed with all needs in reach.  Therapy Documentation Precautions:  Precautions Precautions: Fall Restrictions Weight Bearing Restrictions: Yes RLE Weight Bearing: Non weight bearing General:   Vital Signs: Therapy Vitals Temp: 99.3 F (37.4 C) Temp Source: Oral Pulse Rate: 65 Resp: 18 BP: (!) 160/67 Patient Position (if appropriate): Sitting Oxygen Therapy SpO2: 96 % O2 Device: Room Air Pain: Pain Assessment Pain Scale: 0-10 Pain Score: 9  Pain Intervention(s): (premedicated)   Therapy/Group: Individual Therapy  Simonne Come 07/23/2019, 8:28 AM

## 2019-07-23 NOTE — Progress Notes (Signed)
Social Work Patient ID: Esmeralda Links, female   DOB: August 17, 1929, 85 y.o.   MRN: [id number]  Have reviewed team conference with pt and daughter.  Both aware of targeted d/c date of 8/21.  OT in earlier with pt and reviewed purpose of and introduced hoyer lift transfers.  Discussed insurance coverage, HH follow up and other DME needs.  Daughter states that family is prepared to provide 24/7 assistance at home.  Will need further family education as well.  Pt states her only concern is "having a bill".  Will continue to follow.  Emad Brechtel, LCSW

## 2019-07-24 ENCOUNTER — Inpatient Hospital Stay (HOSPITAL_COMMUNITY): Payer: Medicare Other | Admitting: Occupational Therapy

## 2019-07-24 ENCOUNTER — Inpatient Hospital Stay (HOSPITAL_COMMUNITY): Payer: Medicare Other | Admitting: Physical Therapy

## 2019-07-24 LAB — CBC WITH DIFFERENTIAL/PLATELET
Abs Immature Granulocytes: 0.03 10*3/uL (ref 0.00–0.07)
Basophils Absolute: 0 10*3/uL (ref 0.0–0.1)
Basophils Relative: 0 %
Eosinophils Absolute: 0.3 10*3/uL (ref 0.0–0.5)
Eosinophils Relative: 4 %
HCT: 26.4 % — ABNORMAL LOW (ref 36.0–46.0)
Hemoglobin: 8.3 g/dL — ABNORMAL LOW (ref 12.0–15.0)
Immature Granulocytes: 1 %
Lymphocytes Relative: 14 %
Lymphs Abs: 0.9 10*3/uL (ref 0.7–4.0)
MCH: 32.4 pg (ref 26.0–34.0)
MCHC: 31.4 g/dL (ref 30.0–36.0)
MCV: 103.1 fL — ABNORMAL HIGH (ref 80.0–100.0)
Monocytes Absolute: 0.4 10*3/uL (ref 0.1–1.0)
Monocytes Relative: 7 %
Neutro Abs: 5 10*3/uL (ref 1.7–7.7)
Neutrophils Relative %: 74 %
Platelets: 260 10*3/uL (ref 150–400)
RBC: 2.56 MIL/uL — ABNORMAL LOW (ref 3.87–5.11)
RDW: 15 % (ref 11.5–15.5)
WBC: 6.6 10*3/uL (ref 4.0–10.5)
nRBC: 0 % (ref 0.0–0.2)

## 2019-07-24 MED ORDER — ACETAMINOPHEN 325 MG PO TABS: 325.0000 mg | ORAL_TABLET | ORAL | Status: AC | PRN

## 2019-07-24 NOTE — Progress Notes (Signed)
[facility] PHYSICAL MEDICINE & REHABILITATION PROGRESS NOTE   Subjective/Complaints:  Feeling much better overall. Moving bowels. Pain under control. Feels that she's progressing with therapies  ROS: Patient denies fever, rash, sore throat, blurred vision, nausea, vomiting, diarrhea, cough, shortness of breath or chest pain, joint or back pain, headache, or mood change.    Objective:   No results found. Recent Labs    07/22/19 0511 07/24/19 0835  WBC 6.7 6.6  HGB 8.3* 8.3*  HCT 27.6* 26.4*  PLT 249 260   Recent Labs    07/22/19 0511  NA 140  K 3.8  CL 103  CO2 31  GLUCOSE 113*  BUN 28*  CREATININE 1.37*  CALCIUM 8.3*    Intake/Output Summary (Last 24 hours) at 07/24/2019 1201 Last data filed at 07/23/2019 1528 Gross per 24 hour  Intake 0 ml  Output -  Net 0 ml     Physical Exam: Vital Signs Blood pressure (!) 144/60, pulse (!) 57, temperature 97.8 F (36.6 C), temperature source Oral, resp. rate 18, height 5\' 3"  (1.6 m), weight 106.7 kg, SpO2 98 %. Constitutional: No distress . Vital signs reviewed. HEENT: EOMI, oral membranes moist Neck: supple Cardiovascular: RRR without murmur. No JVD    Respiratory: CTA Bilaterally without wheezes or rales. Normal effort    GI: BS +, non-tender, non-distended  Musculoskeletal:    R>L LE edema Neurological: Alert HOH Motor: Bilateral upper extremities: 4-/5 proximal distal, stable Right lower extremity: Hip flexion, knee extension 4/5, wiggles toes without limitation  Left lower extremity: 4-4+/5 proximal to distal  Skin: Right foot with dressing remains CDI Psychiatric: Normal mood. Normal behavior.  Assessment/Plan: 1. Functional deficits secondary to right ankle fx which require 3+ hours per day of interdisciplinary therapy in a comprehensive inpatient rehab setting.  Physiatrist is providing close team supervision and 24 hour management of active medical problems listed below.  Physiatrist and rehab team  continue to assess barriers to discharge/monitor patient progress toward functional and medical goals  Care Tool:  Bathing    Body parts bathed by patient: Right arm, Left arm, Chest, Abdomen, Face   Body parts bathed by helper: Left lower leg, Front perineal area, Buttocks Body parts n/a: Right lower leg   Bathing assist Assist Level: Minimal Assistance - Patient > 75%     Upper Body Dressing/Undressing Upper body dressing   What is the patient wearing?: Pull over shirt    Upper body assist Assist Level: Minimal Assistance - Patient > 75%    Lower Body Dressing/Undressing Lower body dressing      What is the patient wearing?: Pants     Lower body assist Assist for lower body dressing: 2 Helpers     Toileting Toileting Toileting Activity did not occur (Clothing management and hygiene only): Safety/medical concerns  Toileting assist Assist for toileting: Moderate Assistance - Patient 50 - 74%     Transfers Chair/bed transfer  Transfers assist  Chair/bed transfer activity did not occur: Safety/medical concerns(nausea/vomitting)  Chair/bed transfer assist level: Dependent - mechanical lift     Locomotion Ambulation   Ambulation assist   Ambulation activity did not occur: Safety/medical concerns(decreased strength/endurance, R LE NWB)          Walk 10 feet activity   Assist  Walk 10 feet activity did not occur: Safety/medical concerns(decreased strength/endurance, R LE NWB)        Walk 50 feet activity   Assist Walk 50 feet with 2 turns activity did not occur:  Safety/medical concerns(decreased strength/endurance, R LE NWB)         Walk 150 feet activity   Assist Walk 150 feet activity did not occur: Safety/medical concerns(decreased strength/endurance, R LE NWB)         Walk 10 feet on uneven surface  activity   Assist Walk 10 feet on uneven surfaces activity did not occur: Safety/medical concerns(decreased strength/endurance, R LE  NWB)         Wheelchair     Assist Will patient use wheelchair at discharge?: Yes Type of Wheelchair: Manual    Wheelchair assist level: Supervision/Verbal cueing Max wheelchair distance: 2740'    Wheelchair 50 feet with 2 turns activity    Assist    Wheelchair 50 feet with 2 turns activity did not occur: Safety/medical concerns(decreased UE strength/endurance)       Wheelchair 150 feet activity     Assist Wheelchair 150 feet activity did not occur: Safety/medical concerns(decreased UE strength/endurance, R LE NWB)        Medical Problem List and Plan: 1.  Deficits with mobility, transfers, self-care secondary to right ankle fracture.  Cont CIR PT, OT 2.  Antithrombotics: -DVT/anticoagulation:  Pharmaceutical: Other (comment) Eliquis             -antiplatelet therapy: N/A 3. Pain Management: Continue oxycodone as needed  -  voltaren gel for bilateral knee pain 4. Mood: Team to provide ego support for chronic anxiety.  LCSW to follow for evaluation and support             -antipsychotic agents: N/A 5. Neuropsych: This patient is not fully capable of making decisions on her own behalf. 6. Skin/Wound Care: Monitor incision for healing daily.  Routine pressure relief measures 7. Fluids/Electrolytes/Nutrition: encourage PO  Continue protein supp for low albumin 8.  Acute on chronic renal failure: IVF stopped              -encourage PO fluids  8/12- BUN improved, IVF d/ced 9. Iron deficiency anemia with ABLA: Continue iron supplement             Hemoglobin 8.3 on 8/12, stable at same 8/14 10. HTN: Monitor blood pressures and watch for signs of orthostasis.  Continue losartan, diltiazem, metoprolol, and furosemide daily.  Reasonable control at present 11. Chronic diastolic CHF: Discontinue IV fluids. Continue losartan, metoprolol, furosemide.             Daily weights Filed Weights   07/20/19 0348 07/21/19 0500 07/24/19 0438  Weight: 106 kg 105.2 kg 106.7 kg     Stable on 8/14 12.  PAF: Monitor heart rate TID.  Continue diltiazem and metoprolol.             HR controlled at present on 8/13 13.  Anxiety disorder: Continue Celexa daily 14. Constipation/focal ileus/distal SBO:   -daily senna-s and miralax  Improved, NG out, moving bowels---last bm 8/13  15. Dispo- team conference today- d/c 8/21  LOS: 9 days A FACE TO FACE EVALUATION WAS PERFORMED  Ranelle OysterZachary T Perry Brucato 07/24/2019, 12:01 PM

## 2019-07-24 NOTE — Progress Notes (Signed)
Discussed patient's splint with Dr. Dala Dock with be POD # 14 and question suture removal. Updated him on LOS 8/21. Due to patient's age, he recommends keeping splint/sutures in place till follow up with him in office post discharge. TO continue NWB till follow up.

## 2019-07-24 NOTE — Progress Notes (Signed)
Physical Therapy Session Note  Patient Details  Name: Katherine Tyler MRN: [id number] Date of Birth: 1929/04/16  Today's Date: 07/24/2019 PT Individual Time: 0900-1000 PT Individual Time Calculation (min): 60 min   Short Term Goals: Week 2:  PT Short Term Goal 1 (Week 2): =LTG due to ELOS  Skilled Therapeutic Interventions/Progress Updates:    Pt received seated in bed, agreeable to PT session. No complaints of pain. Rolling L/R with min A to dependently don pants. Supine to long-sit with HOB elevated and use of bedrails to change shirt with max A. Seated in bed to sitting EOB with Supervision with use of bedrails. Slide board transfer bed to w/c with mod A initially progressing to assist x 2 at end of transfer to get fully into w/c. Manual w/c propulsion x 70 ft with use of BUE and Supervision with one rest break. Pt exhibits improved endurance for w/c mobility this date. Sit to stand in // bars initially with mod A progressing to max A with onset of fatigue. Pt initially does well maintaining WBing precautions on RLE but unable to maintain precautions and stand fully upright with fatigue. Seated BLE strengthening therex: HS curls (yellow theraband LLE), LAQ, hip abd with yellow theraband x 10-15 reps each. Pt left seated in w/c in room with needs in reach at end of session.  Therapy Documentation Precautions:  Precautions Precautions: Fall Restrictions Weight Bearing Restrictions: Yes RLE Weight Bearing: Non weight bearing    Therapy/Group: Individual Therapy   Excell Seltzer, PT, DPT  07/24/2019, 12:06 PM

## 2019-07-24 NOTE — Plan of Care (Signed)
  Problem: Consults Goal: RH GENERAL PATIENT EDUCATION Description: See Patient Education module for education specifics. Outcome: Progressing   Problem: RH SKIN INTEGRITY Goal: RH STG SKIN FREE OF INFECTION/BREAKDOWN Description: min assistance  Outcome: Progressing Goal: RH STG MAINTAIN SKIN INTEGRITY WITH ASSISTANCE Description: STG Maintain Skin Integrity With min Assistance. Outcome: Progressing   Problem: RH SAFETY Goal: RH STG ADHERE TO SAFETY PRECAUTIONS W/ASSISTANCE/DEVICE Description: STG Adhere to Safety Precautions With supervisionAssistance/Device. Outcome: Progressing Goal: RH STG DECREASED RISK OF FALL WITH ASSISTANCE Description: STG Decreased Risk of Fall With supervision Assistance. Outcome: Progressing   Problem: RH PAIN MANAGEMENT Goal: RH STG PAIN MANAGED AT OR BELOW [name] PAIN GOAL Description: Pain < 3  Outcome: Progressing   Problem: RH KNOWLEDGE DEFICIT GENERAL Goal: RH STG INCREASE KNOWLEDGE OF SELF CARE AFTER HOSPITALIZATION Description: Min assistance with transfers, able to verbalize fall precautions and safety ideas for home Outcome: Progressing   

## 2019-07-24 NOTE — Progress Notes (Signed)
Occupational Therapy Session Note  Patient Details  Name: Katherine Tyler MRN: [id number] Date of Birth: 04-Feb-1929  Today's Date: 07/24/2019 OT Individual Time: 1300-1400 OT Individual Time Calculation (min): 60 min    Short Term Goals: Week 2:  OT Short Term Goal 1 (Week 2): STG=LTG due to LOS  Skilled Therapeutic Interventions/Progress Updates: focus of session:  Though patient complained of 10+/10 pain in her right ankle, she concurred to at least try to do a toilet transfer.  She stated that her ankle hurt just sitting there without moving it at all, but she also stated she wanted to try to work in spite of the pain.   She stated the staff informed her she could have pain medicine in the next 10 minutes.   This clinician informed patient that if the movement became too painful, she could stop the transfer tasks and  that the this clinician and other rehab staff member would her move back to a more static position and complete other therapeutic, potentially less pain producing activities, such as upper body and upper extremity strengthening and stability  When asked at the beginning of session, what was one area she needed to remember in order to keep her right leg/ankle safe.   She was able to verbalize not to put weight into her foot.   She did state, "They say not to put weight into that leg at the bottom or my foot, but really, I have to at least touch down onto the floor."  Sit to stand to increase indepdence with self care and trasfers=maxA x2  Toilet transfer via sliding board onto extra wide drop arm 3:1=moderate assistance x 2 with cues for proper technique....required reminders to  touch right foot to floor to help herself slide across the sliding board and reminders to lean toward the direction of her movement during the transfer and reminders to lean forward to unweight her left buttock as she attempted press through her left to scoot back into her chair  Toileting= total assist x2  (Patient did ask to have the opportunity to toilet while on the 3:1) Patient was able to wipe off a portion of the front of her perineum area after voiding, but had difficulty with thoroughness due to the widith of her legs and decreased trunk strength to maintain upper body stability when flexing forward her hips during the wiping process.   This decreased core/trunk strength and stability was exhibited with all her sit to stand and hip flexion tasks during her self care and transfers.  Patient required multiple rest breaks during the session, as she became short of breath with all movements.   She stated that he pain fatigued her.  Patient complained of more pain and requested to ly in her bed to elevate her right leg and to rest.  For w/c to bed sliding board transfer, she required moderate assistance x2.  Once in bed, patient concurred to work on triceps strengthening and core strengthening to help ease her sit to stand, standing balance and overall self care tasks.  As well, this work in bed will help her to reposition herself during night hours in order to help her sleep and potentially have more energy for her self care, balance, and transfer trasks.  She was left lying in bed with alarm in place and speaking with a dtr. Who came in to retrieve her dirty clothes.  Continue OT Plan of care.       Therapy Documentation Precautions:  Precautions Precautions: Fall Restrictions Weight Bearing Restrictions: Yes RLE Weight Bearing: Non weight bearing  Pain: Pain Assessment Pain Score: 0-No pain    Therapy/Group: Individual Therapy  Bud Faceickett, [place] [place] [facility] 07/24/2019, 3:03 PM

## 2019-07-24 NOTE — Progress Notes (Signed)
Occupational Therapy Session Note  Patient Details  Name: Katherine Tyler MRN: [id number] Date of Birth: 19-Feb-1929  Today's Date: 07/24/2019 OT Individual Time: 1300-1356 OT Individual Time Calculation (min): 56 min    Short Term Goals: Week 2:  OT Short Term Goal 1 (Week 2): STG=LTG due to LOS  Skilled Therapeutic Interventions/Progress Updates:    Pt seen for OT session focusing on cont caregiver education with pt's daughter [name]. Pt in supine upon arrival with daughter present. Pt with generalized complaints of R LE pain, declined need for intervention. Cont discussion regarding d/c planning and DME. [name] daughter wanting to try sliding board transfer vs. Harrel Lemon for functional transfers.  Pt transferred to sitting EOB with CGA using hospital bed functions and VCs for technique. Completed sliding board transfer EOB>w/c with max A +1 with +2 required to stabilize equipment and assist in shifting hips back into chair.  During all sliding board transfers during session, pt required multi-modal cuing for adherence to [place] with pt's foot propped on top of therapist's with moderate adherence to pre-cautions, less so as pt becoming more fatigued.  In therapy gym, extensive education and demonstration provided regarding proper body mechanics and weight shift/ head/hip relationship for transfer. [name] daughter and therapist each provided min A initially, however, as pt fatigued throughout transfer required max A by end of transfer. [name] daughter reports that she is disabled herself and is very limited in the amount of physical help she can provide. Reports her siblings will be providing physical assist at d/c, however, she is unsure whether they will come in for family training prior to d/c. Provided education regarding need for extensive hands on training prior to d/c as pt requires up to max A for siding board transfers or therapist recommendation of Hoyer lift. [name] also reports that her sister  wants pt to ride in the car vs. Public transportation. Reviewed need for hands on training with actual car prior to d/c if they want to pursue this vs. Ambulance transport home.  Following extended seated rest break, pt returned to w/c with mod A +2. Pt requesting to return to bed at end of session. Min A for return to EOB with +2 present for safety. Pt reports being motivated to return to bed and therefore required less assistance. Pt left in supine with all needs in reach, bed alarm on and daughter present. CSW made aware of caregiver concerns.  Therapy Documentation Precautions:  Precautions Precautions: Fall Restrictions Weight Bearing Restrictions: Yes RLE Weight Bearing: Non weight bearing   Therapy/Group: Individual Therapy  Khaleesi Gruel L 07/24/2019, 7:02 AM

## 2019-07-25 NOTE — Plan of Care (Signed)
  Problem: Consults Goal: RH GENERAL PATIENT EDUCATION Description: See Patient Education module for education specifics. Outcome: Progressing   Problem: RH SKIN INTEGRITY Goal: RH STG SKIN FREE OF INFECTION/BREAKDOWN Description: min assistance  Outcome: Progressing Goal: RH STG MAINTAIN SKIN INTEGRITY WITH ASSISTANCE Description: STG Maintain Skin Integrity With min Assistance. Outcome: Progressing   Problem: RH SAFETY Goal: RH STG ADHERE TO SAFETY PRECAUTIONS W/ASSISTANCE/DEVICE Description: STG Adhere to Safety Precautions With supervisionAssistance/Device. Outcome: Progressing Goal: RH STG DECREASED RISK OF FALL WITH ASSISTANCE Description: STG Decreased Risk of Fall With supervision Assistance. Outcome: Progressing   Problem: RH PAIN MANAGEMENT Goal: RH STG PAIN MANAGED AT OR BELOW [name] PAIN GOAL Description: Pain < 3  Outcome: Progressing   Problem: RH KNOWLEDGE DEFICIT GENERAL Goal: RH STG INCREASE KNOWLEDGE OF SELF CARE AFTER HOSPITALIZATION Description: Min assistance with transfers, able to verbalize fall precautions and safety ideas for home Outcome: Progressing   

## 2019-07-25 NOTE — Progress Notes (Signed)
[place] PHYSICAL MEDICINE & REHABILITATION PROGRESS NOTE   Subjective/Complaints: Still constipated  ROS: Patient denies , nausea, vomiting, diarrhea, cough, shortness of breath or chest pain, joint or back pain,    Objective:   No results found. Recent Labs    07/24/19 0835  WBC 6.6  HGB 8.3*  HCT 26.4*  PLT 260   No results for input(s): NA, K, CL, CO2, GLUCOSE, BUN, CREATININE, CALCIUM in the last 72 hours.  Intake/Output Summary (Last 24 hours) at 07/25/2019 1312 Last data filed at 07/25/2019 0845 Gross per 24 hour  Intake 177 ml  Output -  Net 177 ml     Physical Exam: Vital Signs Blood pressure (!) 135/52, pulse 62, temperature 97.8 F (36.6 C), temperature source Oral, resp. rate 16, height 5\' 3"  (1.6 m), weight 106.5 kg, SpO2 95 %. Constitutional: No distress . Vital signs reviewed. HEENT: EOMI, oral membranes moist Neck: supple Cardiovascular: RRR without murmur. No JVD    Respiratory: CTA Bilaterally without wheezes or rales. Normal effort    GI: BS +, non-tender, non-distended  Musculoskeletal:    R>L LE edema Neurological: Alert HOH Motor: Bilateral upper extremities: 4-/5 proximal distal, stable Right lower extremity: Hip flexion, knee extension 4/5, wiggles toes without limitation  Left lower extremity: 4-4+/5 proximal to distal  Skin: Right foot with dressing remains CDI Psychiatric: Normal mood. Normal behavior.  Assessment/Plan: 1. Functional deficits secondary to right ankle fx which require 3+ hours per day of interdisciplinary therapy in a comprehensive inpatient rehab setting.  Physiatrist is providing close team supervision and 24 hour management of active medical problems listed below.  Physiatrist and rehab team continue to assess barriers to discharge/monitor patient progress toward functional and medical goals  Care Tool:  Bathing    Body parts bathed by patient: Right arm, Left arm, Chest, Abdomen, Face   Body parts bathed by  helper: Left lower leg, Front perineal area, Buttocks Body parts n/a: Right lower leg   Bathing assist Assist Level: Minimal Assistance - Patient > 75%     Upper Body Dressing/Undressing Upper body dressing   What is the patient wearing?: Pull over shirt    Upper body assist Assist Level: Minimal Assistance - Patient > 75%    Lower Body Dressing/Undressing Lower body dressing      What is the patient wearing?: Pants     Lower body assist Assist for lower body dressing: 2 Helpers     Toileting Toileting Toileting Activity did not occur (Clothing management and hygiene only): Safety/medical concerns  Toileting assist Assist for toileting: 2 Helpers     Transfers Chair/bed transfer  Transfers assist  Chair/bed transfer activity did not occur: Safety/medical concerns(nausea/vomitting)  Chair/bed transfer assist level: Moderate Assistance - Patient 50 - 74%(slide board)     Locomotion Ambulation   Ambulation assist   Ambulation activity did not occur: Safety/medical concerns(decreased strength/endurance, R LE NWB)          Walk 10 feet activity   Assist  Walk 10 feet activity did not occur: Safety/medical concerns(decreased strength/endurance, R LE NWB)        Walk 50 feet activity   Assist Walk 50 feet with 2 turns activity did not occur: Safety/medical concerns(decreased strength/endurance, R LE NWB)         Walk 150 feet activity   Assist Walk 150 feet activity did not occur: Safety/medical concerns(decreased strength/endurance, R LE NWB)         Walk 10 feet on uneven  surface  activity   Assist Walk 10 feet on uneven surfaces activity did not occur: Safety/medical concerns(decreased strength/endurance, R LE NWB)         Wheelchair     Assist Will patient use wheelchair at discharge?: Yes Type of Wheelchair: Manual    Wheelchair assist level: Supervision/Verbal cueing Max wheelchair distance: 24'    Wheelchair 50 feet  with 2 turns activity    Assist    Wheelchair 50 feet with 2 turns activity did not occur: Safety/medical concerns(decreased UE strength/endurance)   Assist Level: Supervision/Verbal cueing   Wheelchair 150 feet activity     Assist Wheelchair 150 feet activity did not occur: Safety/medical concerns(decreased UE strength/endurance, R LE NWB)        Medical Problem List and Plan: 1.  Deficits with mobility, transfers, self-care secondary to right ankle fracture.  Cont CIR PT, OT 2.  Antithrombotics: -DVT/anticoagulation:  Pharmaceutical: Other (comment) Eliquis             -antiplatelet therapy: N/A 3. Pain Management: Continue oxycodone as needed, well controlled   -  voltaren gel for bilateral knee pain 4. Mood: Team to provide ego support for chronic anxiety.  LCSW to follow for evaluation and support             -antipsychotic agents: N/A 5. Neuropsych: This patient is not fully capable of making decisions on her own behalf. 6. Skin/Wound Care: Monitor incision for healing daily.  Routine pressure relief measures 7. Fluids/Electrolytes/Nutrition: encourage PO  Continue protein supp for low albumin 8.  Acute on chronic renal failure: IVF stopped              -encourage PO fluids  8/12- BUN improved, IVF d/ced 9. Iron deficiency anemia with ABLA: Continue iron supplement             Hemoglobin 8.3 on 8/12, stable at same 8/14 10. HTN: Monitor blood pressures and watch for signs of orthostasis.  Continue losartan, diltiazem, metoprolol, and furosemide daily.  Reasonable control at present Vitals:   07/25/19 0917 07/25/19 1207  BP: (!) 137/31 (!) 135/52  Pulse: (!) 54 62  Resp:    Temp:    SpO2: 95% 95%  Controlled 8/15 11. Chronic diastolic CHF: Discontinue IV fluids. Continue losartan, metoprolol, furosemide.             Daily weights Filed Weights   07/21/19 0500 07/24/19 0438 07/25/19 0523  Weight: 105.2 kg 106.7 kg 106.5 kg    Stable on 8/14 12.  PAF:  Monitor heart rate TID.  Continue diltiazem and metoprolol.             HR controlled at present on 8/13 13.  Anxiety disorder: Continue Celexa daily 14. Constipation/focal ileus/distal SBO:   -daily senna-s and miralax  Improved, NG out, moving bowels---last bm 8/13  15. Dispo- - d/c 8/21  LOS: 10 days A FACE TO FACE EVALUATION WAS PERFORMED  Charlett Blake 07/25/2019, 1:12 PM

## 2019-07-26 ENCOUNTER — Inpatient Hospital Stay (HOSPITAL_COMMUNITY): Payer: Medicare Other | Admitting: Occupational Therapy

## 2019-07-26 LAB — VITAMIN B1: Vitamin B1 (Thiamine): 199.5 nmol/L (ref 66.5–200.0)

## 2019-07-26 NOTE — Progress Notes (Signed)
Occupational Therapy Session Note  Patient Details  Name: Katherine Tyler MRN: [id number] Date of Birth: 1929-08-28  Today's Date: 07/26/2019 OT Individual Time: 2035-5974 OT Individual Time Calculation (min): 42 min   Short Term Goals: Week 2:  OT Short Term Goal 1 (Week 2): STG=LTG due to LOS     Skilled Therapeutic Interventions/Progress Updates:    Pt greeted in bed and premedicated for pain. Dtr [name] was not present and no +2 available for session. Pt declined participation in ADL tasks but amenable to EOB activity. Supine<sit completed with Mod-Max A. First guided pt through gentle UE A/AROM exercises with yoga-influence. Pt with limited shoulder ROM bilaterally due to arthritis but is motivated to increase her functional ranges. She then completed B UE therapeutic exercises using yellow therband to work on UE strengthening and endurance. Pt completed 10 reps 2 sets of each exercise given instruction on technique. Afterwards +2 assist for returning to supine and for repositioning in bed. Pt was left in bed with all needs within reach, bed alarm set, and 4 bedrails up per request.    Therapy Documentation Precautions:  Precautions Precautions: Fall Restrictions Weight Bearing Restrictions: Yes RLE Weight Bearing: Non weight bearing ADL:   :     Therapy/Group: Individual Therapy  Kimeka Badour A Tannar Broker 07/26/2019, 12:36 PM

## 2019-07-26 NOTE — Progress Notes (Signed)
[place] PHYSICAL MEDICINE & REHABILITATION PROGRESS NOTE   Subjective/Complaints: No issues this morning. Bowel movements x2 yesterday ROS: Patient denies , nausea, vomiting, diarrhea, cough, shortness of breath or chest pain, joint or back pain,    Objective:   No results found. Recent Labs    07/24/19 0835  WBC 6.6  HGB 8.3*  HCT 26.4*  PLT 260   No results for input(s): NA, K, CL, CO2, GLUCOSE, BUN, CREATININE, CALCIUM in the last 72 hours.  Intake/Output Summary (Last 24 hours) at 07/26/2019 1320 Last data filed at 07/26/2019 0719 Gross per 24 hour  Intake 399 ml  Output -  Net 399 ml     Physical Exam: Vital Signs Blood pressure (!) 151/45, pulse 70, temperature 98.4 F (36.9 C), resp. rate (!) 22, height 5\' 3"  (1.6 m), weight 108.2 kg, SpO2 95 %. Constitutional: No distress . Vital signs reviewed. HEENT: EOMI, oral membranes moist Neck: supple Cardiovascular: RRR without murmur. No JVD    Respiratory: CTA Bilaterally without wheezes or rales. Normal effort    GI: BS +, non-tender, non-distended  Musculoskeletal:    R>L LE edema Neurological: Alert HOH Motor: Bilateral upper extremities: 4-/5 proximal distal, stable Right lower extremity: Hip flexion, knee extension 4/5, wiggles toes without limitation  Left lower extremity: 4-4+/5 proximal to distal  Skin: Right foot with dressing remains CDI Psychiatric: Normal mood. Normal behavior.  Assessment/Plan: 1. Functional deficits secondary to right ankle fx which require 3+ hours per day of interdisciplinary therapy in a comprehensive inpatient rehab setting.  Physiatrist is providing close team supervision and 24 hour management of active medical problems listed below.  Physiatrist and rehab team continue to assess barriers to discharge/monitor patient progress toward functional and medical goals  Care Tool:  Bathing    Body parts bathed by patient: Right arm, Left arm, Chest, Abdomen, Face   Body  parts bathed by helper: Left lower leg, Front perineal area, Buttocks Body parts n/a: Right lower leg   Bathing assist Assist Level: Minimal Assistance - Patient > 75%     Upper Body Dressing/Undressing Upper body dressing   What is the patient wearing?: Pull over shirt    Upper body assist Assist Level: Minimal Assistance - Patient > 75%    Lower Body Dressing/Undressing Lower body dressing      What is the patient wearing?: Pants     Lower body assist Assist for lower body dressing: 2 Helpers     Toileting Toileting Toileting Activity did not occur (Clothing management and hygiene only): Safety/medical concerns  Toileting assist Assist for toileting: 2 Helpers     Transfers Chair/bed transfer  Transfers assist  Chair/bed transfer activity did not occur: Safety/medical concerns(nausea/vomitting)  Chair/bed transfer assist level: Moderate Assistance - Patient 50 - 74%(slide board)     Locomotion Ambulation   Ambulation assist   Ambulation activity did not occur: Safety/medical concerns(decreased strength/endurance, R LE NWB)          Walk 10 feet activity   Assist  Walk 10 feet activity did not occur: Safety/medical concerns(decreased strength/endurance, R LE NWB)        Walk 50 feet activity   Assist Walk 50 feet with 2 turns activity did not occur: Safety/medical concerns(decreased strength/endurance, R LE NWB)         Walk 150 feet activity   Assist Walk 150 feet activity did not occur: Safety/medical concerns(decreased strength/endurance, R LE NWB)         Walk 10  feet on uneven surface  activity   Assist Walk 10 feet on uneven surfaces activity did not occur: Safety/medical concerns(decreased strength/endurance, R LE NWB)         Wheelchair     Assist Will patient use wheelchair at discharge?: Yes Type of Wheelchair: Manual    Wheelchair assist level: Supervision/Verbal cueing Max wheelchair distance: 31'     Wheelchair 50 feet with 2 turns activity    Assist    Wheelchair 50 feet with 2 turns activity did not occur: Safety/medical concerns(decreased UE strength/endurance)   Assist Level: Supervision/Verbal cueing   Wheelchair 150 feet activity     Assist Wheelchair 150 feet activity did not occur: Safety/medical concerns(decreased UE strength/endurance, R LE NWB)        Medical Problem List and Plan: 1.  Deficits with mobility, transfers, self-care secondary to right ankle fracture.  Cont CIR PT, OT 2.  Antithrombotics: -DVT/anticoagulation:  Pharmaceutical: Other (comment) Eliquis             -antiplatelet therapy: N/A 3. Pain Management: Continue oxycodone as needed, well controlled   -  voltaren gel for bilateral knee pain 4. Mood: Team to provide ego support for chronic anxiety.  LCSW to follow for evaluation and support             -antipsychotic agents: N/A 5. Neuropsych: This patient is not fully capable of making decisions on her own behalf. 6. Skin/Wound Care: Monitor incision for healing daily.  Routine pressure relief measures 7. Fluids/Electrolytes/Nutrition: encourage PO  Continue protein supp for low albumin 8.  Acute on chronic renal failure: IVF stopped              -encourage PO fluids  8/12- BUN improved, IVF d/ced 9. Iron deficiency anemia with ABLA: Continue iron supplement             Hemoglobin 8.3 on 8/12, stable at same 8/14 10. HTN: Monitor blood pressures and watch for signs of orthostasis.  Continue losartan, diltiazem, metoprolol, and furosemide daily.  Reasonable control at present Vitals:   07/26/19 0550 07/26/19 0552  BP: (!) 151/45 (!) 151/45  Pulse:  70  Resp:  (!) 22  Temp:  98.4 F (36.9 C)  SpO2:  95%  Controlled 8/16 11. Chronic diastolic CHF: Discontinue IV fluids. Continue losartan, metoprolol, furosemide.             Daily weights Filed Weights   07/24/19 0438 07/25/19 0523 07/26/19 0500  Weight: 106.7 kg 106.5 kg 108.2  kg    Stable on 8/14 12.  PAF: Monitor heart rate TID.  Continue diltiazem and metoprolol.             HR controlled at present on 8/13 13.  Anxiety disorder: Continue Celexa daily 14. Constipation/focal ileus/distal SBO:   -daily senna-s and miralax  --last bm 8/15  15. Dispo- - d/c 8/21  LOS: 11 days A FACE TO FACE EVALUATION WAS PERFORMED  Charlett Blake 07/26/2019, 1:20 PM

## 2019-07-27 ENCOUNTER — Inpatient Hospital Stay (HOSPITAL_COMMUNITY): Payer: Medicare Other

## 2019-07-27 ENCOUNTER — Inpatient Hospital Stay (HOSPITAL_COMMUNITY): Payer: Medicare Other | Admitting: Occupational Therapy

## 2019-07-27 ENCOUNTER — Inpatient Hospital Stay (HOSPITAL_COMMUNITY): Payer: Medicare Other | Admitting: Physical Therapy

## 2019-07-27 LAB — BASIC METABOLIC PANEL
Anion gap: 8 (ref 5–15)
BUN: 26 mg/dL — ABNORMAL HIGH (ref 8–23)
CO2: 26 mmol/L (ref 22–32)
Calcium: 8.2 mg/dL — ABNORMAL LOW (ref 8.9–10.3)
Chloride: 102 mmol/L (ref 98–111)
Creatinine, Ser: 1.26 mg/dL — ABNORMAL HIGH (ref 0.44–1.00)
GFR calc Af Amer: 44 mL/min — ABNORMAL LOW (ref >60)
GFR calc non Af Amer: 38 mL/min — ABNORMAL LOW (ref >60)
Glucose, Bld: 109 mg/dL — ABNORMAL HIGH (ref 70–99)
Potassium: 3.6 mmol/L (ref 3.5–5.1)
Sodium: 136 mmol/L (ref 135–145)

## 2019-07-27 LAB — CBC
HCT: 27.4 % — ABNORMAL LOW (ref 36.0–46.0)
Hemoglobin: 8.5 g/dL — ABNORMAL LOW (ref 12.0–15.0)
MCH: 32.3 pg (ref 26.0–34.0)
MCHC: 31 g/dL (ref 30.0–36.0)
MCV: 104.2 fL — ABNORMAL HIGH (ref 80.0–100.0)
Platelets: 252 10*3/uL (ref 150–400)
RBC: 2.63 MIL/uL — ABNORMAL LOW (ref 3.87–5.11)
RDW: 15.1 % (ref 11.5–15.5)
WBC: 6.9 10*3/uL (ref 4.0–10.5)
nRBC: 0 % (ref 0.0–0.2)

## 2019-07-27 MED ORDER — SENNOSIDES-DOCUSATE SODIUM 8.6-50 MG PO TABS
2.0000 | ORAL_TABLET | Freq: Two times a day (BID) | ORAL | Status: DC
  Administered 2019-07-28 – 2019-08-05 (×13): 2 via ORAL
  Filled 2019-07-27 (×17): qty 2

## 2019-07-27 MED ORDER — SODIUM CHLORIDE 0.9 % IV SOLN
INTRAVENOUS | Status: DC
  Administered 2019-07-27 – 2019-08-02 (×9): via INTRAVENOUS

## 2019-07-27 NOTE — Progress Notes (Signed)
Physical Therapy Session Note  Patient Details  Name: Katherine Tyler MRN: [id number] Date of Birth: [birth year]  Today's Date: 07/27/2019 PT Individual Time: 3557-3220 PT Individual Time Calculation (min): 38 min   Short Term Goals: Week 2:  PT Short Term Goal 1 (Week 2): =LTG due to ELOS  Skilled Therapeutic Interventions/Progress Updates:    Pt in bed upon arrival, reports increased nausea last pm. Now with NG tube on suction. Nursing reports pt with ilius. Pt agreeable to PT session but wants to stay in bed. TE: PF into foot of bed, SAQ, Hip abd/add, QS each 1X10. Encouraged pt to work on exercises throughout the day. Sitting EOB - LAQ, marching 2x20. Bed Mob: supervision-min assist rolling with rails. Supine>sitting min assist with rail, sit>supine min assist LEs. Pt able to scoot up in bed using LLE and bed in trendelenberg (mod assist by PT with pad). Sitting: while sitting EOB pt working on exercises and general sitting balance. Duration limited by pt reports of increasing nausea. Pt returned to supine with HOB elevated, all needs in reach.   Therapy Documentation Precautions:  Precautions Precautions: Fall Restrictions Weight Bearing Restrictions: Yes RLE Weight Bearing: Non weight bearing   Pain: Pt reports minimal pain, unable to put a numerical value - monitored during session .    Therapy/Group: Individual Therapy  Linard Millers 07/27/2019, 10:39 AM

## 2019-07-27 NOTE — Progress Notes (Signed)
Pt presents with nausea and vomiting. Pt had large loose brown BM and vomited 141mL of green emesis. This nurse gave pt PRN compazine 10 mL. Called on-call MD Alysia Penna. KUB was ordered, pt was made NPO per orders and NS 35mL/hr will be started per order. Head of bed elevated and call bell in reach.

## 2019-07-27 NOTE — Progress Notes (Signed)
Occupational Therapy Session Note  Patient Details  Name: Katherine Tyler MRN: [id number] Date of Birth: [birth year]  Today's Date: 07/27/2019 OT Missed Time: 68 Minutes Missed Time Reason: Patient fatigue;Patient ill (comment)   Short Term Goals: Week 2:  OT Short Term Goal 1 (Week 2): STG=LTG due to LOS  Pt in supine with daughter present. Pt with very little interaction with therapist, opening eyes only intermittently and only groaning she didn't feel well. [name] daughter reports she has been feeling ill all day. Pt declined any mobility or activity.  RN and PA made aware of pt's condition and daughter's concerns.   Therapy/Group: Individual Therapy  Wiletta Bermingham L 07/27/2019, 7:10 AM

## 2019-07-27 NOTE — Progress Notes (Signed)
[place] PHYSICAL MEDICINE & REHABILITATION PROGRESS NOTE   Subjective/Complaints:Nausea and vomiting again this AM, in spite of BM x2 yesterday,  Got NGT placed this AM by Dr Parke Poisson order due toi KUB which showed local ileus and partial SBO again.  Chart says LBM Sunday- pt says Friday.   ROS: Patient denies CP, SOB, cough, HA Objective:   Dg Abd 1 View  Result Date: 07/27/2019 CLINICAL DATA:  NG tube placement EXAM: ABDOMEN - 1 VIEW COMPARISON:  Earlier same day FINDINGS: Enteric tube tip and side port projected the expected location of the gastric fundus. Redemonstrated multiple moderately dilated loops of small bowel with index loop of small bowel with left mid hemiabdomen measuring approximately 3.9 cm in diameter, similar to previous examinations. There is mild gaseous distension of the transverse colon with otherwise paucity of distal colonic gas. Post cholecystectomy. Limited visualization of lower thorax demonstrates atherosclerotic plaque within thoracic aorta. No definite acute osseous abnormalities. Degenerative change the lower lumbar spine is suspected though incompletely evaluated. IMPRESSION: 1. Enteric tube tip and side port project over the expected location of the gastric fundus. 2. Similar findings of ileus versus partial small bowel obstruction Electronically Signed   By: Sandi Mariscal M.D.   On: 07/27/2019 07:18   Dg Abd 1 View  Result Date: 07/27/2019 CLINICAL DATA:  Nausea, vomiting, diarrhea. EXAM: ABDOMEN - 1 VIEW COMPARISON:  Radiographs 07/21/2019, 07/20/2019 FINDINGS: Dilated central small bowel loops measuring up to 3.6 cm, increased from prior exam. Mild gaseous gastric distension. Small amount of air and stool within nondilated colon. Upper abdomen not included in the field of view. Cholecystectomy clips in the right upper quadrant. IMPRESSION: Progressive small bowel dilatation since most recent exam last week, ileus versus small-bowel obstruction.  Electronically Signed   By: Keith Rake M.D.   On: 07/27/2019 02:38   Recent Labs    07/27/19 0731  WBC 6.9  HGB 8.5*  HCT 27.4*  PLT 252   Recent Labs    07/27/19 0731  NA 136  K 3.6  CL 102  CO2 26  GLUCOSE 109*  BUN 26*  CREATININE 1.26*  CALCIUM 8.2*    Intake/Output Summary (Last 24 hours) at 07/27/2019 1110 Last data filed at 07/27/2019 0600 Gross per 24 hour  Intake 553.63 ml  Output -  Net 553.63 ml     Physical Exam: Vital Signs Blood pressure (!) 178/48, pulse 80, temperature 98.2 F (36.8 C), resp. rate (!) 24, height 5\' 3"  (1.6 m), weight 106.8 kg, SpO2 95 %. Constitutional: No distress . Vital signs and labs reviewed. Lying in bed, appears uncomfortable, NGT in place in R nare, no acute distress, on IVFs 75cc/hr- NPO HEENT: EOMI, oral membranes moist Neck: supple Cardiovascular: RRR without murmur.     Respiratory: CTA Bilaterally without wheezes or rales. Normal effort    GI: BS - hyperactive slightly, Mildly TTP, distended again, as well as protuberant Musculoskeletal:    R>L LE edema Neurological: Alert HOH Motor: Bilateral upper extremities: 4-/5 proximal distal, stable Right lower extremity: Hip flexion, knee extension 4/5, wiggles toes without limitation  Left lower extremity: 4-4+/5 proximal to distal  Skin: Right foot with dressing remains CDI Psychiatric: Normal mood. Normal behavior.  Assessment/Plan: 1. Functional deficits secondary to right ankle fx which require 3+ hours per day of interdisciplinary therapy in a comprehensive inpatient rehab setting.  Physiatrist is providing close team supervision and 24 hour management of active medical problems listed below.  Physiatrist and rehab  team continue to assess barriers to discharge/monitor patient progress toward functional and medical goals  Care Tool:  Bathing    Body parts bathed by patient: Right arm, Left arm, Chest, Abdomen, Face   Body parts bathed by helper: Left lower  leg, Front perineal area, Buttocks Body parts n/a: Right lower leg   Bathing assist Assist Level: Minimal Assistance - Patient > 75%     Upper Body Dressing/Undressing Upper body dressing   What is the patient wearing?: Pull over shirt    Upper body assist Assist Level: Minimal Assistance - Patient > 75%    Lower Body Dressing/Undressing Lower body dressing      What is the patient wearing?: Pants     Lower body assist Assist for lower body dressing: 2 Helpers     Toileting Toileting Toileting Activity did not occur (Clothing management and hygiene only): Safety/medical concerns  Toileting assist Assist for toileting: 2 Helpers     Transfers Chair/bed transfer  Transfers assist  Chair/bed transfer activity did not occur: Safety/medical concerns(nausea/vomitting)  Chair/bed transfer assist level: Moderate Assistance - Patient 50 - 74%(slide board)     Locomotion Ambulation   Ambulation assist   Ambulation activity did not occur: Safety/medical concerns(decreased strength/endurance, R LE NWB)          Walk 10 feet activity   Assist  Walk 10 feet activity did not occur: Safety/medical concerns(decreased strength/endurance, R LE NWB)        Walk 50 feet activity   Assist Walk 50 feet with 2 turns activity did not occur: Safety/medical concerns(decreased strength/endurance, R LE NWB)         Walk 150 feet activity   Assist Walk 150 feet activity did not occur: Safety/medical concerns(decreased strength/endurance, R LE NWB)         Walk 10 feet on uneven surface  activity   Assist Walk 10 feet on uneven surfaces activity did not occur: Safety/medical concerns(decreased strength/endurance, R LE NWB)         Wheelchair     Assist Will patient use wheelchair at discharge?: Yes Type of Wheelchair: Manual    Wheelchair assist level: Supervision/Verbal cueing Max wheelchair distance: 4170'    Wheelchair 50 feet with 2 turns  activity    Assist    Wheelchair 50 feet with 2 turns activity did not occur: Safety/medical concerns(decreased UE strength/endurance)   Assist Level: Supervision/Verbal cueing   Wheelchair 150 feet activity     Assist Wheelchair 150 feet activity did not occur: Safety/medical concerns(decreased UE strength/endurance, R LE NWB)        Medical Problem List and Plan: 1.  Deficits with mobility, transfers, self-care secondary to right ankle fracture.  Cont CIR PT, OT 2.  Antithrombotics: -DVT/anticoagulation:  Pharmaceutical: Other (comment) Eliquis             -antiplatelet therapy: N/A 3. Pain Management: Continue oxycodone as needed, well controlled   -  voltaren gel for bilateral knee pain 4. Mood: Team to provide ego support for chronic anxiety.  LCSW to follow for evaluation and support             -antipsychotic agents: N/A 5. Neuropsych: This patient is not fully capable of making decisions on her own behalf. 6. Skin/Wound Care: Monitor incision for healing daily.  Routine pressure relief measures 7. Fluids/Electrolytes/Nutrition: encourage PO  Continue protein supp for low albumin 8.  Acute on chronic renal failure: IVF stopped              -  encourage PO fluids  8/12- BUN improved, IVF d/ced 9. Iron deficiency anemia with ABLA: Continue iron supplement             Hemoglobin 8.3 on 8/12, stable at same 8/14 10. HTN: Monitor blood pressures and watch for signs of orthostasis.  Continue losartan, diltiazem, metoprolol, and furosemide daily.  Reasonable control at present Vitals:   07/27/19 0003 07/27/19 0325  BP: (!) 152/48 (!) 178/48  Pulse:  80  Resp:  (!) 24  Temp:  98.2 F (36.8 C)  SpO2:  95%  Controlled 8/16 11. Chronic diastolic CHF: Discontinue IV fluids. Continue losartan, metoprolol, furosemide.             Daily weights Filed Weights   07/25/19 0523 07/26/19 0500 07/27/19 0325  Weight: 106.5 kg 108.2 kg 106.8 kg    Stable on 8/14 12.  PAF:  Monitor heart rate TID.  Continue diltiazem and metoprolol.             HR controlled at present on 8/13 13.  Anxiety disorder: Continue Celexa daily 14. Constipation/focal ileus/distal SBO:   -daily senna-s and miralax  --last bm 8/15  8/17- another partial SBO and ileus again on KUB- NGT placed again, IVFs 75 cc/hr and NPO otherwise- increase Senokot S to 2 tabs BID as well to prevent from occurring again. Will get KUB tomorrow to f/u.  15. Dispo- - d/c 8/21  8/17- will talk to SW about keeping longer due to SBO  -therapy asked if pt could go home with foley since cannot do transfers easily at all.  LOS: 12 days A FACE TO FACE EVALUATION WAS PERFORMED  Ewan Grau 07/27/2019, 11:10 AM

## 2019-07-27 NOTE — Plan of Care (Signed)
  Problem: Consults Goal: RH GENERAL PATIENT EDUCATION Description: See Patient Education module for education specifics. Outcome: Progressing   Problem: RH SKIN INTEGRITY Goal: RH STG SKIN FREE OF INFECTION/BREAKDOWN Description: min assistance  Outcome: Progressing Goal: RH STG MAINTAIN SKIN INTEGRITY WITH ASSISTANCE Description: STG Maintain Skin Integrity With min Assistance. Outcome: Progressing   Problem: RH SAFETY Goal: RH STG ADHERE TO SAFETY PRECAUTIONS W/ASSISTANCE/DEVICE Description: STG Adhere to Safety Precautions With supervisionAssistance/Device. Outcome: Progressing Goal: RH STG DECREASED RISK OF FALL WITH ASSISTANCE Description: STG Decreased Risk of Fall With supervision Assistance. Outcome: Progressing   Problem: RH PAIN MANAGEMENT Goal: RH STG PAIN MANAGED AT OR BELOW [name] PAIN GOAL Description: Pain < 3  Outcome: Progressing   Problem: RH KNOWLEDGE DEFICIT GENERAL Goal: RH STG INCREASE KNOWLEDGE OF SELF CARE AFTER HOSPITALIZATION Description: Min assistance with transfers, able to verbalize fall precautions and safety ideas for home Outcome: Progressing   

## 2019-07-27 NOTE — Progress Notes (Signed)
Occupational Therapy Session Note  Patient Details  Name: BRITTNEY MUCHA MRN: [id number] Date of Birth: Nov 01, 1929  Today's Date: 07/27/2019 OT Individual Time: 3151-7616 OT Individual Time Calculation (min): 10 min  and Today's Date: 07/27/2019 OT Missed Time: 30 Minutes Missed Time Reason: Patient fatigue;Patient ill (comment)   Short Term Goals: Week 2:  OT Short Term Goal 1 (Week 2): STG=LTG due to LOS  Skilled Therapeutic Interventions/Progress Updates:    Pt supine in bed with daughter present in the room. Pt continues to report nausea and caregiver with several questions. OT encouraged pt for participation but she does not feel well at this time and is currently NPO. RN arrived to answer questions and pt declines OT intervention at this time. Bed alarm activated and call bell within reach upon exiting the room.   Therapy Documentation Precautions:  Precautions Precautions: Fall Restrictions Weight Bearing Restrictions: Yes RLE Weight Bearing: Non weight bearing General: General OT Amount of Missed Time: 30 Minutes Vital Signs: Therapy Vitals Temp: 99.2 F (37.3 C) Temp Source: Oral Pulse Rate: 80 Resp: 20 BP: (!) 161/57 Patient Position (if appropriate): Lying Oxygen Therapy SpO2: 95 % O2 Device: Room Air   Therapy/Group: Individual Therapy  Gypsy Decant 07/27/2019, 3:33 PM

## 2019-07-27 NOTE — Progress Notes (Signed)
Continue to hold PO medications per Linna Hoff Angiulli-PA-C. Will reassess in the AM.

## 2019-07-27 NOTE — Progress Notes (Signed)
KUB results came back, MD Alysia Penna notified. NG placement ordered per MD. Patient is resting with call bell at side, nurse will continue to monitor.

## 2019-07-28 ENCOUNTER — Inpatient Hospital Stay (HOSPITAL_COMMUNITY): Payer: Medicare Other | Admitting: Physical Therapy

## 2019-07-28 ENCOUNTER — Inpatient Hospital Stay (HOSPITAL_COMMUNITY): Payer: Medicare Other

## 2019-07-28 ENCOUNTER — Inpatient Hospital Stay (HOSPITAL_COMMUNITY): Payer: Medicare Other | Admitting: Occupational Therapy

## 2019-07-28 NOTE — Progress Notes (Signed)
Occupational Therapy Session Note  Patient Details  Name: Katherine Tyler MRN: [id number] Date of Birth: Nov 16, 1929  Today's Date: 07/28/2019 OT Individual Time: 1100-1200 OT Individual Time Calculation (min): 60 min    Short Term Goals: Week 2:  OT Short Term Goal 1 (Week 2): STG=LTG due to LOS  Skilled Therapeutic Interventions/Progress Updates:    Pt seen for OT ADL bathing/dressing session. Pt awake in supine upon arrival, complaints of cont nausea but willing to participate as able.  She transferred to sitting EOB with min A using hospital bed functions with VCs for technique/sequencing. Bathing/dressing routine completed from EOB. Supervision/set-up UB bathing and pt able to reach down to ankle on L LE to wash. She returned to supine position in order to have pericare/buttock hygiene. Pt found to have been incontinent of bladder, pt said she was aware she had incontinent episode. Education provided regarding importance of alerting staff in order to keep skin dry and intact. She then voiced need for BM, placed on bedpan, and liquid stool and urine occurrence. +2 assist for hygiene and donning new brief. Assist to maintain NWBing during bed mobility.  Returned to sitting EOB and She donned gown with min A. Total A to don L sock and shoe. Completed sliding board transfer to w/c, +2 max A with total A required to advance hips fully back into chair. Pt left seated in w/c at end of session, all needs in reach.    Therapy Documentation Precautions:  Precautions Precautions: Fall Restrictions Weight Bearing Restrictions: Yes RLE Weight Bearing: Non weight bearing   Therapy/Group: Individual Therapy  [name] L 07/28/2019, 7:06 AM

## 2019-07-28 NOTE — Progress Notes (Addendum)
[place] PHYSICAL MEDICINE & REHABILITATION PROGRESS NOTE   Subjective/Complaints:  Pt reports feeling better than yesterday- all day yesterday felt really bad and abd was very painful/hurting.  Just got back from KUB- asking specifically for water and ice chips.   ROS: Patient denies CP, SOB, cough, HA Objective:   Dg Abd 1 View  Result Date: 07/28/2019 CLINICAL DATA:  Abdominal pain, distention EXAM: ABDOMEN - 1 VIEW COMPARISON:  07/27/2019 FINDINGS: NG tube remains coiled in the stomach. Decreasing small bowel distention. A few mildly prominent mid abdominal small bowel loops remain. No organomegaly or free air. Surgical clips in the right upper quadrant compatible with prior cholecystectomy. IMPRESSION: Improving bowel-gas pattern with decreasing small bowel distention. Electronically Signed   By: Rolm Baptise M.D.   On: 07/28/2019 08:15   Dg Abd 1 View  Result Date: 07/27/2019 CLINICAL DATA:  NG tube placement EXAM: ABDOMEN - 1 VIEW COMPARISON:  Earlier same day FINDINGS: Enteric tube tip and side port projected the expected location of the gastric fundus. Redemonstrated multiple moderately dilated loops of small bowel with index loop of small bowel with left mid hemiabdomen measuring approximately 3.9 cm in diameter, similar to previous examinations. There is mild gaseous distension of the transverse colon with otherwise paucity of distal colonic gas. Post cholecystectomy. Limited visualization of lower thorax demonstrates atherosclerotic plaque within thoracic aorta. No definite acute osseous abnormalities. Degenerative change the lower lumbar spine is suspected though incompletely evaluated. IMPRESSION: 1. Enteric tube tip and side port project over the expected location of the gastric fundus. 2. Similar findings of ileus versus partial small bowel obstruction Electronically Signed   By: Sandi Mariscal M.D.   On: 07/27/2019 07:18   Dg Abd 1 View  Result Date: 07/27/2019 CLINICAL DATA:   Nausea, vomiting, diarrhea. EXAM: ABDOMEN - 1 VIEW COMPARISON:  Radiographs 07/21/2019, 07/20/2019 FINDINGS: Dilated central small bowel loops measuring up to 3.6 cm, increased from prior exam. Mild gaseous gastric distension. Small amount of air and stool within nondilated colon. Upper abdomen not included in the field of view. Cholecystectomy clips in the right upper quadrant. IMPRESSION: Progressive small bowel dilatation since most recent exam last week, ileus versus small-bowel obstruction. Electronically Signed   By: Keith Rake M.D.   On: 07/27/2019 02:38   Recent Labs    07/27/19 0731  WBC 6.9  HGB 8.5*  HCT 27.4*  PLT 252   Recent Labs    07/27/19 0731  NA 136  K 3.6  CL 102  CO2 26  GLUCOSE 109*  BUN 26*  CREATININE 1.26*  CALCIUM 8.2*    Intake/Output Summary (Last 24 hours) at 07/28/2019 1450 Last data filed at 07/28/2019 0600 Gross per 24 hour  Intake 825 ml  Output -  Net 825 ml     Physical Exam: Vital Signs Blood pressure (!) 155/40, pulse 75, temperature 98.9 F (37.2 C), temperature source Oral, resp. rate 16, height 5\' 3"  (1.6 m), weight 106.8 kg, SpO2 96 %. Constitutional: No distress . Vital signs and labs reviewed.Lying in bed, just got back to room from KUB, tech still in room, they hooked her back to suction- cannister shows greyish green fluid 1/8 cannister full, NAD HEENT: EOMI, oral membranes moist- better than nml Neck: supple Cardiovascular: RRR without murmur.     Respiratory: CTA Bilaterally without wheezes or rales.  GI: BS - hypoactive slightly, Mildly TTP but better than yesterday, much softer than 8/17; protuberant/obese, less distended Musculoskeletal:    R>L  LE edema Neurological: Alert HOH Motor: Bilateral upper extremities: 4-/5 proximal distal, stable Right lower extremity: Hip flexion, knee extension 4/5, wiggles toes without limitation  Left lower extremity: 4-4+/5 proximal to distal  Skin: Right foot with dressing remains  CDI Psychiatric: Normal mood. Normal behavior. Bright affect, considering ileus  Assessment/Plan: 1. Functional deficits secondary to right ankle fx which require 3+ hours per day of interdisciplinary therapy in a comprehensive inpatient rehab setting.  Physiatrist is providing close team supervision and 24 hour management of active medical problems listed below.  Physiatrist and rehab team continue to assess barriers to discharge/monitor patient progress toward functional and medical goals  Care Tool:  Bathing    Body parts bathed by patient: Right arm, Left arm, Chest, Abdomen, Face, Right upper leg, Left upper leg   Body parts bathed by helper: Front perineal area, Buttocks, Left lower leg Body parts n/a: Right lower leg   Bathing assist Assist Level: Minimal Assistance - Patient > 75%     Upper Body Dressing/Undressing Upper body dressing   What is the patient wearing?: Dress    Upper body assist Assist Level: Supervision/Verbal cueing    Lower Body Dressing/Undressing Lower body dressing      What is the patient wearing?: Incontinence brief     Lower body assist Assist for lower body dressing: Total Assistance - Patient < 25%     Toileting Toileting Toileting Activity did not occur Press photographer(Clothing management and hygiene only): Safety/medical concerns  Toileting assist Assist for toileting: 2 Helpers     Transfers Chair/bed transfer  Transfers assist  Chair/bed transfer activity did not occur: Safety/medical concerns(nausea/vomitting)  Chair/bed transfer assist level: 2 Helpers     Locomotion Ambulation   Ambulation assist   Ambulation activity did not occur: Safety/medical concerns(decreased strength/endurance, R LE NWB)          Walk 10 feet activity   Assist  Walk 10 feet activity did not occur: Safety/medical concerns(decreased strength/endurance, R LE NWB)        Walk 50 feet activity   Assist Walk 50 feet with 2 turns activity did not  occur: Safety/medical concerns(decreased strength/endurance, R LE NWB)         Walk 150 feet activity   Assist Walk 150 feet activity did not occur: Safety/medical concerns(decreased strength/endurance, R LE NWB)         Walk 10 feet on uneven surface  activity   Assist Walk 10 feet on uneven surfaces activity did not occur: Safety/medical concerns(decreased strength/endurance, R LE NWB)         Wheelchair     Assist Will patient use wheelchair at discharge?: Yes Type of Wheelchair: Manual    Wheelchair assist level: Supervision/Verbal cueing Max wheelchair distance: 8670'    Wheelchair 50 feet with 2 turns activity    Assist    Wheelchair 50 feet with 2 turns activity did not occur: Safety/medical concerns(decreased UE strength/endurance)   Assist Level: Supervision/Verbal cueing   Wheelchair 150 feet activity     Assist Wheelchair 150 feet activity did not occur: Safety/medical concerns(decreased UE strength/endurance, R LE NWB)        Medical Problem List and Plan: 1.  Deficits with mobility, transfers, self-care secondary to right ankle fracture.  Cont CIR PT, OT 2.  Antithrombotics: -DVT/anticoagulation:  Pharmaceutical: Other (comment) Eliquis             -antiplatelet therapy: N/A 3. Pain Management: Continue oxycodone as needed, well controlled   -  voltaren gel for bilateral knee pain 4. Mood: Team to provide ego support for chronic anxiety.  LCSW to follow for evaluation and support             -antipsychotic agents: N/A 5. Neuropsych: This patient is not fully capable of making decisions on her own behalf. 6. Skin/Wound Care: Monitor incision for healing daily.  Routine pressure relief measures 7. Fluids/Electrolytes/Nutrition: encourage PO  Continue protein supp for low albumin 8.  Acute on chronic renal failure: IVF stopped              -encourage PO fluids  8/12- BUN improved, IVF d/ced  8/18- on IVFs again, will allow pt PO  water/ice chips 9. Iron deficiency anemia with ABLA: Continue iron supplement             Hemoglobin 8.3 on 8/12, stable at same 8/14 10. HTN: Monitor blood pressures and watch for signs of orthostasis.  Continue losartan, diltiazem, metoprolol, and furosemide daily.  Reasonable control at present Vitals:   07/28/19 0950 07/28/19 1418  BP: (!) 177/77 (!) 155/40  Pulse: 92 75  Resp:  16  Temp:  98.9 F (37.2 C)  SpO2:  96%  Controlled 8/16 11. Chronic diastolic CHF: Discontinue IV fluids. Continue losartan, metoprolol, furosemide.             Daily weights Filed Weights   07/25/19 0523 07/26/19 0500 07/27/19 0325  Weight: 106.5 kg 108.2 kg 106.8 kg    Stable on 8/14 12.  PAF: Monitor heart rate TID.  Continue diltiazem and metoprolol.             HR controlled at present on 8/13 13.  Anxiety disorder: Continue Celexa daily 14. Constipation/focal ileus/distal SBO:   -daily senna-s and miralax  --last bm 8/15  8/17- another partial SBO and ileus again on KUB- NGT placed again, IVFs 75 cc/hr and NPO otherwise- increase Senokot S to 2 tabs BID as well to prevent from occurring again. Will get KUB tomorrow to f/u.  8/18- NGT still in place/on suction; will allow water/ice chips, but check another KUB in AM to f/u.-  15. Dispo- - d/c 8/21  8/17- will talk to SW about keeping longer due to SBO  -therapy asked if pt could go home with foley since cannot do transfers easily at all.  -pt needs to keep staples in until f/u with surgeon and maintain NWB, per PA, Pam Love note.   LOS: 13 days A FACE TO FACE EVALUATION WAS PERFORMED  Vandell Kun 07/28/2019, 2:50 PM

## 2019-07-28 NOTE — Progress Notes (Signed)
Physical Therapy Session Note  Patient Details  Name: Katherine Tyler MRN: [id number] Date of Birth: Jul 06, 1929  Today's Date: 07/28/2019 PT Individual Time: 2025-4270 PT Individual Time Calculation (min): 71 min   Short Term Goals: Week 2:  PT Short Term Goal 1 (Week 2): =LTG due to ELOS  Skilled Therapeutic Interventions/Progress Updates:    Pt received sitting in w/c with her daughter, [name], present and pt agreeable to therapy session. Performed R LE LAQ x20 reps with cuing for full ROM and isometric hold at full extension. RN present to detach NG tube from wall for therapy session. Pt performed ~128ft B UE w/c propulsion with min assist and cuing for increased UE movement to perform propulsion - pillow placed behind pt's back to increase anterior trunk lean and improve pt's ability to reach wheels. Transported remainder of distance to therapy gym. Sit<>stand x4 in parallel bars with mod/max assist for lifting and cuing for extending R LE prior to standing to maintain NWBing precautions - pt noted to be performing more of a TDWB and despite max cuing unable to maintain NWB. While standing pt performed 2x20 reps of the following exercises with R LE: hip flexion, hip abduction, and hamstring curls - cuing throughout for proper technique/form and min assist for balance with B UE support on // bars. Transported to day room in w/c. R lateral scoot transfer w/c>EOM with total assist for board placement and mod assist +2 for scooting hips - cuing for increased anterior weight shift, to maintain R LE NWB, and manual facilitation for increased use of L LE. L lateral scoot transfer EOM>w/c using transfer board with total assist for board placement and min/mod assist of 1 for scooting hips - continued cuing for increased anterior trunk weight shift and to maintain R LE NWB. Performed sitting in w/c zoom ball focusing on activity tolerance and B UE strengthening 2 bouts to pt fatigue. Transported back to room. R  lateral scoot transfer w/c>EOB using transfer board with total assist for board placement and +2 mod assist for scooting hips. Sit>supine with +2 mod assist for B LE management and trunk descent. Nursing staff present to perform scheduled vitals assessment and WNL for activity. Pt left supine in bed with needs in reach and bed alarm on.  Therapy Documentation Precautions:  Precautions Precautions: Fall Restrictions Weight Bearing Restrictions: Yes RLE Weight Bearing: Non weight bearing  Pain:   Reports "it hasn't quite hurting" referring to R ankle 10/10 towards end of therapy session with pt stating "if I'm laying down it hurts...if I'm sitting up it hurts" denying increase from therapy session and deferring any intervention - provided seated rest breaks as needed throughout session for pain management.   Therapy/Group: Individual Therapy  Tawana Scale, PT, DPT 07/28/2019, 12:18 PM

## 2019-07-28 NOTE — Progress Notes (Signed)
Physical Therapy Session Note  Patient Details  Name: Katherine Tyler MRN: [id number] Date of Birth: [birth year]  Today's Date: 07/28/2019 PT Individual Time: 0900-1000 PT Individual Time Calculation (min): 60 min   Short Term Goals: Week 2:  PT Short Term Goal 1 (Week 2): =LTG due to ELOS  Skilled Therapeutic Interventions/Progress Updates:    Pt received seated in bed, agreeable to participate in therapy session. Pt reports pain in RLE, not rated and declines intervention. Pt reports feeling significantly fatigued and weak and wishes she could drink water but understands she is currently NPO. Supine BLE strengthening therex: heel slides, hip abd, quad sets 2 x 10 reps each. Supine to sit with min A with HOB elevated and use of bedrail. Seated BLE strengthening therex: marches, LAQ, hip add/abd, HS curls with yellow theraband. Pt exhibits decreased R knee ROM 2/2 HS tightness. Pt requires AAROM for R hip flex. Pt declines to perform transfer to w/c this AM. Sit to supine min A. Supine RLE HS stretch 3 x 60 sec. Pt left seated in bed with needs in reach, bed alarm in place at end of session.  Therapy Documentation Precautions:  Precautions Precautions: Fall Restrictions Weight Bearing Restrictions: Yes RLE Weight Bearing: Non weight bearing    Therapy/Group: Individual Therapy   Excell Seltzer, PT, DPT  07/28/2019, 12:30 PM

## 2019-07-29 ENCOUNTER — Inpatient Hospital Stay (HOSPITAL_COMMUNITY): Payer: Medicare Other | Admitting: Occupational Therapy

## 2019-07-29 ENCOUNTER — Inpatient Hospital Stay (HOSPITAL_COMMUNITY): Payer: Medicare Other

## 2019-07-29 NOTE — Progress Notes (Signed)
Occupational Therapy Session Note  Patient Details  Name: Katherine Tyler MRN: [id number] Date of Birth: Jun 28, 1929  Today's Date: 07/29/2019 OT Individual Time: 1400-1500 OT Individual Time Calculation (min): 60 min    Short Term Goals: Week 2:  OT Short Term Goal 1 (Week 2): STG=LTG due to LOS  Skilled Therapeutic Interventions/Progress Updates:    Pt seen for OT session focusing on hands on caregiver training, mobility, and activity tolerance. Pt in supine upon arrival with daughter present. Pt agreeable to tx session, soreness in R LE but willing to participate without need for intervention. She transferred to sitting EOB with supervision using hospital bed functions. Pt able to direct daughter in proper set-up of sliding board transfer and equipment with mod cuing provided by therapist. Pt initially advancing across sliding board with min A for uneven level transfer, however, fatigued and required max A to position hips fully back into hips with +2 assist.  Pt propelled w/c in hallway ~85ft with min A and VCs for technique.  In therapy gym, worked on pt leaning forward to obtain bean bag and toss to target to facilitate anterior weight shift and for core strengthening/stability. Completed x2 Callia Swim of 8 with rest breaks provided throughout. Pt able to consistently bring self up off back of w/c without physical assist from therapist.  Education provided regarding functional aspects of this movement. Pt returned to room at end of session and requesting back to bed. Required max A +2 sliding board transfer back to bed with pt's daughter assisting with transfer and managing w/c parts. Pt left in supine at end of session, all needs in reach, bed alarm on and daughter present. Education/discussion throughout session regarding home ADL routine, continuum of care, activity progression, energy conservation, DME, cont family education, d/c recommendations and d/c planning.   Therapy  Documentation Precautions:  Precautions Precautions: Fall Restrictions Weight Bearing Restrictions: Yes RLE Weight Bearing: Non weight bearing   Therapy/Group: Individual Therapy  Beyza Bellino L 07/29/2019, 3:18 PM

## 2019-07-29 NOTE — Progress Notes (Signed)
Occupational Therapy Session Note  Patient Details  Name: Katherine Tyler MRN: [id number] Date of Birth: 07/23/1929  Today's Date: 07/29/2019 OT Individual Time: 1030-1112 OT Individual Time Calculation (min): 42 min    Short Term Goals: Week 1:  OT Short Term Goal 1 (Week 1): Pt will consistently transfer with +1 assist using LRAD while maintaining WBing pre-cautions OT Short Term Goal 1 - Progress (Week 1): Not met OT Short Term Goal 2 (Week 1): Pt will don pants with mod A using AE PRN OT Short Term Goal 2 - Progress (Week 1): Not met OT Short Term Goal 3 (Week 1): Pt will consistently complete sit>stand with no more than mod A using LRAD while maintaining WBing pre-cautions during functional task. OT Short Term Goal 3 - Progress (Week 1): Not met  Skilled Therapeutic Interventions/Progress Updates:    1;1. Pt received in w/c with daughter present asking about car transfers. Deferred to PT, however educated likely seeking ambulance transfer as hoyer is currently recommended for bed<>chair transfers. Pt completes 2 sit to stand with mod VC for NWB precaution adherence with OT foot under RLE to feel amount of pressure applied through foot by pt. Pt matches cards in standing with increased WB thoruhg foot when reaching R on tabletop. Pt completes MOD A +2 SBT w/c<>EOM with mod VC for anterior weight shifting and manual faciliateion of hand placement. Pt completes 3x10 modified sit ups from wedge for core strengthening and AAROM of shoulder flexion and chest press lying on wedge for BUE strengthening. Exited session with pt seated in bed, exit alarm on and call light in reach  Therapy Documentation Precautions:  Precautions Precautions: Fall Restrictions Weight Bearing Restrictions: Yes RLE Weight Bearing: Non weight bearing General:   Vital Signs:  Pain:   ADL:   Vision   Perception    Praxis   Exercises:   Other Treatments:     Therapy/Group: Individual Therapy    M  07/29/2019, 11:13 AM 

## 2019-07-29 NOTE — Progress Notes (Signed)
Physical Therapy Session Note  Patient Details  Name: Katherine Tyler MRN: [id number] Date of Birth: 07/05/1929  Today's Date: 07/29/2019 PT Individual Time: 0905-1005 PT Individual Time Calculation (min): 60 min   Short Term Goals: Week 1:  PT Short Term Goal 1 (Week 1): Patient will perform bed mobility with supervision using LRAD. PT Short Term Goal 1 - Progress (Week 1): Not met PT Short Term Goal 2 (Week 1): Patient will perform transfers with max A of 1 person. PT Short Term Goal 2 - Progress (Week 1): Not met PT Short Term Goal 3 (Week 1): Patient will propel w/c >25 feet without a rest break. PT Short Term Goal 3 - Progress (Week 1): Met PT Short Term Goal 4 (Week 1): Patient will perform sit<>stand while maintaining NWB on R LE with max A of 1 person. PT Short Term Goal 4 - Progress (Week 1): Not met Week 2:  PT Short Term Goal 1 (Week 2): =LTG due to ELOS Week 3:     Skilled Therapeutic Interventions/Progress Updates:      Therapy Documentation Precautions:  Precautions Precautions: Fall Restrictions Weight Bearing Restrictions: Yes RLE Weight Bearing: Non weight bearing AM Session: Pain: 4/10 ankle, therapy to tolerance Therapeutic Exercise:  Supine in bed performed heel slides x 15, hip abd/add x 12, clamshells x 12  Sit to stand in parallel bars x 4 using bars to pull up and mod assist of 1, cga of 2nd person and therapist monitoring wbing status - pt w/difficulty maintaining wbing w/transition and requires constant cueing in standing to maintain NWBing (tends to use TDWBing).  Stood 1min, 45 sec x 2, 30 sec x 1 and perfomed hip abd/add, hip flexion, hamstring curls all which help w/NWBIng. 8-12 reps each.  UBE x 5 min for cardiovascular conditioning and UE strengthening w/RPE 5/10 following activity.    Therapeutic Activity:  Pt initially supine in bed.  Rolled L and R multiple times  w/rails and cues plus min assist at hips to complete for changing of diaper by  therapist.  Pants donned by therapist and pt rolled L and R as above to allow therapist to pull pants up.   Supine to side min assist, side to sit w/min  assist and use of rail. Static sit on edge of bed w/supervision.  Sliding board transfer bed to wc w/max assist to place board, mod to max assist of 2 bed to wc, verbal cues for head hips/wt shifting and to adhere to wbing precs.  wc propulsion 15ft x 1, 10ft x1 using bilat UE's, very slow progression and cues to use efficient strokes.     Assessment:   Pt demonstrates poor ability to boost on sliding board limited primarily by body habitus.  Will require continued UE/core/RLE strengthening, transfer training, endurance training and family training likely w/lift to prepare for dc.   Therapy/Group: Individual Therapy   , PT   07/29/2019, 10:21 AM  

## 2019-07-29 NOTE — Progress Notes (Signed)
[facility] PHYSICAL MEDICINE & REHABILITATION PROGRESS NOTE   Subjective/Complaints:  Pt reports feeling much better- hasn't gotten KUB as of yet- seen at 8:30 am.  Had 2 BMs this AM ,per pt- nursing reported loose BMs this AM and 2 last night- stools all liquid, no formed stool. Still on IVFs- can have PO water/ice chips. Still incontinent of urine intermittently- doesn't know it.  Abd feels much better- said even yesterday was still somewhat painful (didn't say that yesterday)  ROS: Patient denies CP, SOB, cough, HA Objective:   Dg Abd 1 View  Result Date: 07/29/2019 CLINICAL DATA:  Check gastric catheter EXAM: ABDOMEN - 1 VIEW COMPARISON:  07/28/2011 FINDINGS: Gastric catheter is coiled within the stomach. It is kinked upon itself but similar to that seen on the prior exam. Scattered large and small bowel gas is noted. IMPRESSION: Gastric catheter within the stomach although kinked upon itself similar to that noted on the prior study. Electronically Signed   By: Alcide CleverMark  Lukens M.D.   On: 07/29/2019 09:40   Dg Abd 1 View  Result Date: 07/28/2019 CLINICAL DATA:  Abdominal pain, distention EXAM: ABDOMEN - 1 VIEW COMPARISON:  07/27/2019 FINDINGS: NG tube remains coiled in the stomach. Decreasing small bowel distention. A few mildly prominent mid abdominal small bowel loops remain. No organomegaly or free air. Surgical clips in the right upper quadrant compatible with prior cholecystectomy. IMPRESSION: Improving bowel-gas pattern with decreasing small bowel distention. Electronically Signed   By: Charlett NoseKevin  Dover M.D.   On: 07/28/2019 08:15   Recent Labs    07/27/19 0731  WBC 6.9  HGB 8.5*  HCT 27.4*  PLT 252   Recent Labs    07/27/19 0731  NA 136  K 3.6  CL 102  CO2 26  GLUCOSE 109*  BUN 26*  CREATININE 1.26*  CALCIUM 8.2*    Intake/Output Summary (Last 24 hours) at 07/29/2019 1255 Last data filed at 07/29/2019 0400 Gross per 24 hour  Intake 1293.18 ml  Output -  Net 1293.18  ml     Physical Exam: Vital Signs Blood pressure (!) 177/64, pulse 77, temperature 98.3 F (36.8 C), resp. rate 18, height 5\' 3"  (1.6 m), weight 107.6 kg, SpO2 97 %. Constitutional: No distress . Vital signs and xray, labs reviewed. Lying in bed, NGT in R nare, hooked up to wall suction- 1/8 full dark greenish liquid in wall cannister, color better, talking more, appears more comfortable, NAD HEENT: EOMI, oral membranes moist- better than nml Cardiovascular: RRR without murmur.     Respiratory: CTA Bilaterally without wheezes or rales.  GI: BS - more soft, NT, protuberant vs slightly distended, hyperactive BS today Musculoskeletal:    R>L LE edema Neurological: Alert HOH Motor: Bilateral upper extremities: 4-/5 proximal distal, stable Right lower extremity: Hip flexion, knee extension 4/5, wiggles toes without limitation  Left lower extremity: 4-4+/5 proximal to distal  Skin: Right foot with dressing remains CDI; R hand shows bluish brusing, likely from IVs Psychiatric: Normal mood. Normal behavior. Bright affect, considering ileus still  Assessment/Plan: 1. Functional deficits secondary to right ankle fx which require 3+ hours per day of interdisciplinary therapy in a comprehensive inpatient rehab setting.  Physiatrist is providing close team supervision and 24 hour management of active medical problems listed below.  Physiatrist and rehab team continue to assess barriers to discharge/monitor patient progress toward functional and medical goals  Care Tool:  Bathing    Body parts bathed by patient: Right arm, Left arm, Chest,  Abdomen, Face, Right upper leg, Left upper leg   Body parts bathed by helper: Front perineal area, Buttocks, Left lower leg Body parts n/a: Right lower leg   Bathing assist Assist Level: Minimal Assistance - Patient > 75%     Upper Body Dressing/Undressing Upper body dressing   What is the patient wearing?: Dress    Upper body assist Assist Level:  Supervision/Verbal cueing    Lower Body Dressing/Undressing Lower body dressing      What is the patient wearing?: Incontinence brief     Lower body assist Assist for lower body dressing: Total Assistance - Patient < 25%     Toileting Toileting Toileting Activity did not occur Press photographer(Clothing management and hygiene only): Safety/medical concerns  Toileting assist Assist for toileting: 2 Helpers     Transfers Chair/bed transfer  Transfers assist  Chair/bed transfer activity did not occur: Safety/medical concerns(nausea/vomitting)  Chair/bed transfer assist level: 2 Helpers     Locomotion Ambulation   Ambulation assist   Ambulation activity did not occur: Safety/medical concerns(decreased strength/endurance, R LE NWB)          Walk 10 feet activity   Assist  Walk 10 feet activity did not occur: Safety/medical concerns(decreased strength/endurance, R LE NWB)        Walk 50 feet activity   Assist Walk 50 feet with 2 turns activity did not occur: Safety/medical concerns(decreased strength/endurance, R LE NWB)         Walk 150 feet activity   Assist Walk 150 feet activity did not occur: Safety/medical concerns(decreased strength/endurance, R LE NWB)         Walk 10 feet on uneven surface  activity   Assist Walk 10 feet on uneven surfaces activity did not occur: Safety/medical concerns(decreased strength/endurance, R LE NWB)         Wheelchair     Assist Will patient use wheelchair at discharge?: Yes Type of Wheelchair: Manual    Wheelchair assist level: Minimal Assistance - Patient > 75% Max wheelchair distance: 16500ft    Wheelchair 50 feet with 2 turns activity    Assist    Wheelchair 50 feet with 2 turns activity did not occur: Safety/medical concerns(decreased UE strength/endurance)   Assist Level: Minimal Assistance - Patient > 75%   Wheelchair 150 feet activity     Assist Wheelchair 150 feet activity did not occur:  Safety/medical concerns(decreased UE strength/endurance, R LE NWB)        Medical Problem List and Plan: 1.  Deficits with mobility, transfers, self-care secondary to right ankle fracture.  Cont CIR PT, OT  8/19- hasn't been able to participate as well due to ileus/hooked up to wall suction per therapy. 2.  Antithrombotics: -DVT/anticoagulation:  Pharmaceutical: Other (comment) Eliquis             -antiplatelet therapy: N/A 3. Pain Management: Continue oxycodone as needed, well controlled   -  voltaren gel for bilateral knee pain 4. Mood: Team to provide ego support for chronic anxiety.  LCSW to follow for evaluation and support             -antipsychotic agents: N/A 5. Neuropsych: This patient is not fully capable of making decisions on her own behalf. 6. Skin/Wound Care: Monitor incision for healing daily.  Routine pressure relief measures 7. Fluids/Electrolytes/Nutrition: encourage PO  Continue protein supp for low albumin 8.  Acute on chronic renal failure: IVF stopped              -encourage PO  fluids  8/12- BUN improved, IVF d/ced  8/18- on IVFs again, will allow pt PO water/ice chips  8/19- labs in AM 9. Iron deficiency anemia with ABLA: Continue iron supplement             Hemoglobin 8.3 on 8/12, stable at same 8/14 10. HTN: Monitor blood pressures and watch for signs of orthostasis.  Continue losartan, diltiazem, metoprolol, and furosemide daily.  Reasonable control at present  8/19- BP more elevated than nml- could be abd pain vs IVFs- will monitor and change meds if not improved in next 234-48 hrs. Vitals:   07/29/19 0608 07/29/19 0610  BP: (!) 175/58 (!) 177/64  Pulse: 79 77  Resp: 18   Temp: 98.3 F (36.8 C)   SpO2: 96% 97%  Controlled 8/16 11. Chronic diastolic CHF: Discontinue IV fluids. Continue losartan, metoprolol, furosemide.             Daily weights Filed Weights   07/26/19 0500 07/27/19 0325 07/29/19 0500  Weight: 108.2 kg 106.8 kg 107.6 kg    Stable  on 8/14 12.  PAF: Monitor heart rate TID.  Continue diltiazem and metoprolol.             HR controlled at present on 8/13 13.  Anxiety disorder: Continue Celexa daily 14. Constipation/focal ileus/distal SBO:   -daily senna-s and miralax  --last bm 8/15  8/17- another partial SBO and ileus again on KUB- NGT placed again, IVFs 75 cc/hr and NPO otherwise- increase Senokot S to 2 tabs BID as well to prevent from occurring again. Will get KUB tomorrow to f/u.  8/18- NGT still in place/on suction; will allow water/ice chips, but check another KUB in AM to f/u.-   8/19- NGT to suction- still draining- on IVFs 75/hr- will start liquid diet and hold wall suction when taking PO x 30 minutes-  15. Dispo- - d/c 8/21  8/17- will talk to SW about keeping longer due to SBO  -therapy asked if pt could go home with foley since cannot do transfers easily at all.  -pt needs to keep staples in until f/u with surgeon and maintain NWB, per PA, Pam Love note.  8/19- team conference today- will d/c 8/26 since slow progress form medical issues, however is still max assist of 2 for transfers- will need Hoyer lift at home, unfortunately since cannot maintain NWB on LE.   LOS: 14 days A FACE TO FACE EVALUATION WAS PERFORMED  Kaena Santori 07/29/2019, 12:55 PM

## 2019-07-30 ENCOUNTER — Inpatient Hospital Stay (HOSPITAL_COMMUNITY): Payer: Medicare Other | Admitting: Physical Therapy

## 2019-07-30 ENCOUNTER — Inpatient Hospital Stay (HOSPITAL_COMMUNITY): Payer: Medicare Other | Admitting: Occupational Therapy

## 2019-07-30 ENCOUNTER — Inpatient Hospital Stay (HOSPITAL_COMMUNITY): Payer: Medicare Other

## 2019-07-30 LAB — CBC WITH DIFFERENTIAL/PLATELET
Abs Immature Granulocytes: 0.02 10*3/uL (ref 0.00–0.07)
Basophils Absolute: 0 10*3/uL (ref 0.0–0.1)
Basophils Relative: 1 %
Eosinophils Absolute: 0.1 10*3/uL (ref 0.0–0.5)
Eosinophils Relative: 3 %
HCT: 25.7 % — ABNORMAL LOW (ref 36.0–46.0)
Hemoglobin: 7.8 g/dL — ABNORMAL LOW (ref 12.0–15.0)
Immature Granulocytes: 1 %
Lymphocytes Relative: 14 %
Lymphs Abs: 0.6 10*3/uL — ABNORMAL LOW (ref 0.7–4.0)
MCH: 31.2 pg (ref 26.0–34.0)
MCHC: 30.4 g/dL (ref 30.0–36.0)
MCV: 102.8 fL — ABNORMAL HIGH (ref 80.0–100.0)
Monocytes Absolute: 0.3 10*3/uL (ref 0.1–1.0)
Monocytes Relative: 7 %
Neutro Abs: 3.3 10*3/uL (ref 1.7–7.7)
Neutrophils Relative %: 74 %
Platelets: 231 10*3/uL (ref 150–400)
RBC: 2.5 MIL/uL — ABNORMAL LOW (ref 3.87–5.11)
RDW: 15.1 % (ref 11.5–15.5)
WBC: 4.4 10*3/uL (ref 4.0–10.5)
nRBC: 0 % (ref 0.0–0.2)

## 2019-07-30 LAB — COMPREHENSIVE METABOLIC PANEL
ALT: 16 U/L (ref 0–44)
AST: 24 U/L (ref 15–41)
Albumin: 2.2 g/dL — ABNORMAL LOW (ref 3.5–5.0)
Alkaline Phosphatase: 52 U/L (ref 38–126)
Anion gap: 9 (ref 5–15)
BUN: 18 mg/dL (ref 8–23)
CO2: 22 mmol/L (ref 22–32)
Calcium: 8.1 mg/dL — ABNORMAL LOW (ref 8.9–10.3)
Chloride: 109 mmol/L (ref 98–111)
Creatinine, Ser: 1.03 mg/dL — ABNORMAL HIGH (ref 0.44–1.00)
GFR calc Af Amer: 56 mL/min — ABNORMAL LOW (ref >60)
GFR calc non Af Amer: 48 mL/min — ABNORMAL LOW (ref >60)
Glucose, Bld: 86 mg/dL (ref 70–99)
Potassium: 3 mmol/L — ABNORMAL LOW (ref 3.5–5.1)
Sodium: 140 mmol/L (ref 135–145)
Total Bilirubin: 0.7 mg/dL (ref 0.3–1.2)
Total Protein: 5.2 g/dL — ABNORMAL LOW (ref 6.5–8.1)

## 2019-07-30 MED ORDER — POTASSIUM CHLORIDE CRYS ER 20 MEQ PO TBCR
40.0000 meq | EXTENDED_RELEASE_TABLET | Freq: Three times a day (TID) | ORAL | Status: AC
  Administered 2019-07-30 – 2019-07-31 (×3): 40 meq via ORAL
  Filled 2019-07-30 (×3): qty 2

## 2019-07-30 MED ORDER — DILTIAZEM HCL 60 MG PO TABS
90.0000 mg | ORAL_TABLET | Freq: Four times a day (QID) | ORAL | Status: DC
  Administered 2019-07-30 – 2019-07-31 (×3): 90 mg via ORAL
  Filled 2019-07-30 (×4): qty 1

## 2019-07-30 NOTE — Patient Care Conference (Signed)
Inpatient RehabilitationTeam Conference and Plan of Care Update Date: 07/29/2019   Time: 9:55 AM    Patient Name: Katherine Tyler      Medical Record Number: [id number]  Date of Birth: [birth year] Sex: Female         Room/Bed: 4W07C/4W07C-01 Payor Info: Payor: MEDICARE / Plan: MEDICARE PART A AND B / Product Type: *No Product type* /    Admitting Diagnosis: 3. SCI Team  Other Ortho; RT. Tib Fib FX; 15-17days  Admit Date/Time:  07/15/2019  4:38 PM Admission Comments: No comment available   Primary Diagnosis:  Closed fracture of right fibula and tibia, sequela Principal Problem: Closed fracture of right fibula and tibia, sequela  Patient Active Problem List   Diagnosis Date Noted  . Labile blood pressure   . Closed fracture of right fibula and tibia, sequela 07/15/2019  . Acute blood loss anemia   . AKI (acute kidney injury) ([place])   . Fracture of tibial shaft, open 07/12/2019  . PAF (paroxysmal atrial fibrillation) ([place])   . Obesity   . Hypertension   . Hyperlipidemia   . GERD (gastroesophageal reflux disease)   . Hiatal hernia   . Arthritis   . Anxiety   . Chronic diastolic CHF (congestive heart failure) ([place])   . Current use of long term anticoagulation 01/31/2018  . Chronic diastolic heart failure ([place]) 01/31/2018  . A-fib ([place]) 01/16/2018  . CHF (congestive heart failure) ([place]) 01/14/2018  . Hyperkalemia 01/14/2018  . Iron deficiency anemia 10/01/2017  . Osteoarthritis 09/24/2016  . Essential hypertension   . Hyponatremia 06/18/2016  . Hepatic cirrhosis ([place]) 06/18/2016  . Hypokalemia 06/18/2016  . Metabolic syndrome 29/93/7169  . Obesity (BMI 30-39.9) 09/12/2015  . GAD (generalized anxiety disorder) 05/03/2015  . Varicose veins 06/24/2013  . Seasonal allergic rhinitis 04/30/2013  . Vitamin D deficiency 04/16/2013  . Hypothyroidism 04/16/2013  . Genu valgum (acquired) 04/16/2013    Expected Discharge Date: Expected Discharge Date: 08/05/19  Team Members  Present: Physician leading conference: Dr. Courtney Heys Social Worker Present: Lennart Pall, LCSW Nurse Present: Benjie Karvonen, RN PT Present: Excell Seltzer, PT OT Present: Amy Rounds, OT PPS Coordinator present : Gunnar Fusi, SLP     Current Status/Progress Goal Weekly Team Focus  Medical   NWB on LE due to ankle fx, significant 2nd ileus/partial SBO- still has NGT  d/c NGT and get taking PO without constipation/ileus  d/c NGT   Bowel/Bladder   Continent with some episodes of incontinence; current NG tube for SBO, LBM: 08/18  Time tolieting while up  assist with tolieting needs prn   Swallow/Nutrition/ Hydration             ADL's   +2 sliding board transfers, mod A bathing from EOB, total A toileting, min A UB dressing, total A LB dressing. Hoyer [employer] copy with family  Downgraded to mod-max overall  ADL re-training, functional activity tolerance, family education and d/c planning   Mobility   min A bed mobility with use of hospital bed features, A x 2 SB transfers, S w/c mobility x 40'  min A bed mobility, S w/c mobility, hoyer for transfers  bed mobility, transfers, family education   Communication             Safety/Cognition/ Behavioral Observations            Pain   c/o pain to RLEl prn oxycodone q6hr  pain level <3/10  assess pain level   Skin   Incision  to R ankle with soft cast; MASD to groin, MCG powder; ecchymosis  Remain free of new skin breakdown/infection  assess skin QS and prn    Rehab Goals Patient on target to meet rehab goals: No Rehab Goals Revised: Ongoing medical issues which have affected progress with therapies *See Care Plan and progress notes for long and short-term goals.     Barriers to Discharge  Current Status/Progress Possible Resolutions Date Resolved   Physician    Decreased caregiver support;Medical stability;Incontinence;Home environment access/layout;Weight;Weight bearing restrictions;Nutrition means  n/a  max assist of 2 for sliding  board transfers- min assist for bed mobility  d/c NGT, and teach family hoyer lift training for d/c      Nursing                  PT                    OT                  SLP                SW                Discharge Planning/Teaching Needs:  Home with family providing 24/7 assistance.  Teaching has begun and will be ongoing.   Team Discussion:  Now with return on ileus and partial SBO - NG replaced.  Improving but not yet resolved.  No pain c/o;  Some incont bladder. Still not maintaining  WB;  Min bed mobility.  +2 transfers vs using hoyer.  Need hands on training with caregivers - SW to follow up.  Recommend extension of stay.  Revisions to Treatment Plan:  NA    Continued Need for Acute Rehabilitation Level of Care: The patient requires daily medical management by a physician with specialized training in physical medicine and rehabilitation for the following conditions: Daily direction of a multidisciplinary physical rehabilitation program to ensure safe treatment while eliciting the highest outcome that is of practical value to the patient.: Yes Daily medical management of patient stability for increased activity during participation in an intensive rehabilitation regime.: Yes Daily analysis of laboratory values and/or radiology reports with any subsequent need for medication adjustment of medical intervention for : Wound care problems;Renal problems;Nutritional problems;Other   I attest that I was present, lead the team conference, and concur with the assessment and plan of the team.   Amada JupiterHOYLE, Aimee Timmons 07/30/2019, 3:29 PM    Team conference was held via web/ teleconference due to COVID - 19

## 2019-07-30 NOTE — Progress Notes (Signed)
Social Work Patient ID: Katherine Tyler, female   DOB: 10-29-1929, 85 y.o.   MRN: [id number]  Have reviewed team conference with pt and daughter, [name].  Both aware of change in targeted d/c date to 8/26 in order to meet targeted goals.  Discussed need with hands on care with family prior to d/c and daughter to check with others.  May need to do some teaching over the weekend.  Will let txs know.  Lilias Lorensen, LCSW

## 2019-07-30 NOTE — Progress Notes (Signed)
[place] PHYSICAL MEDICINE & REHABILITATION PROGRESS NOTE   Subjective/Complaints:  Pt reports feeling MUCH better- tolerated liquid dinner last night and breakfast this AM with no issues/kept it down with no nausea.  Ready to get NGT out- will take out and give regular lunch- asked pt to not overeat meals so doesn't get constipated/ileus again.  ROS: Patient denies CP, SOB, cough, HA Objective:   Dg Abd 1 View  Result Date: 07/29/2019 CLINICAL DATA:  Check gastric catheter EXAM: ABDOMEN - 1 VIEW COMPARISON:  07/28/2011 FINDINGS: Gastric catheter is coiled within the stomach. It is kinked upon itself but similar to that seen on the prior exam. Scattered large and small bowel gas is noted. IMPRESSION: Gastric catheter within the stomach although kinked upon itself similar to that noted on the prior study. Electronically Signed   By: Alcide CleverMark  Lukens M.D.   On: 07/29/2019 09:40   Recent Labs    07/30/19 0504  WBC 4.4  HGB 7.8*  HCT 25.7*  PLT 231   Recent Labs    07/30/19 0504  NA 140  K 3.0*  CL 109  CO2 22  GLUCOSE 86  BUN 18  CREATININE 1.03*  CALCIUM 8.1*    Intake/Output Summary (Last 24 hours) at 07/30/2019 1155 Last data filed at 07/30/2019 0900 Gross per 24 hour  Intake 660 ml  Output 100 ml  Net 560 ml     Physical Exam: Vital Signs Blood pressure (!) 175/59, pulse 65, temperature 99 F (37.2 C), temperature source Oral, resp. rate 18, height 5\' 3"  (1.6 m), weight 107.6 kg, SpO2 95 %. Constitutional: No distress . Vital signs and xray, labs reviewed. Esp higher BPs Lying in bed, NGT in R nare, not hooked up to suction due to just finishing liquid breakfast, bright affect, good color, NAD HEENT: EOMI, oral membranes moist- better than nml Cardiovascular: RRR without murmur.     Respiratory: CTA Bilaterally without wheezes or rales.  GI: normoactive BS, NT, ND, protuberant but less so noticed Musculoskeletal:    R>L LE edema Neurological: Alert HOH Motor:  Bilateral upper extremities: 4-/5 proximal distal, stable Right lower extremity: Hip flexion, knee extension 4/5, wiggles toes without limitation  Left lower extremity: 4-4+/5 proximal to distal  Skin: Right foot with dressing remains CDI; R hand shows bluish brusing, likely from IVs Psychiatric: Normal mood. Normal behavior. Bright affect   Assessment/Plan: 1. Functional deficits secondary to right ankle fx which require 3+ hours per day of interdisciplinary therapy in a comprehensive inpatient rehab setting.  Physiatrist is providing close team supervision and 24 hour management of active medical problems listed below.  Physiatrist and rehab team continue to assess barriers to discharge/monitor patient progress toward functional and medical goals  Care Tool:  Bathing    Body parts bathed by patient: Right arm, Left arm, Chest, Abdomen, Face, Right upper leg, Left upper leg   Body parts bathed by helper: Front perineal area, Buttocks, Left lower leg Body parts n/a: Right lower leg   Bathing assist Assist Level: Minimal Assistance - Patient > 75%     Upper Body Dressing/Undressing Upper body dressing   What is the patient wearing?: Pull over shirt    Upper body assist Assist Level: Minimal Assistance - Patient > 75%    Lower Body Dressing/Undressing Lower body dressing      What is the patient wearing?: Incontinence brief, Pants     Lower body assist Assist for lower body dressing: 2 Helpers  Toileting Toileting Toileting Activity did not occur Press photographer(Clothing management and hygiene only): Safety/medical concerns  Toileting assist Assist for toileting: 2 Helpers     Transfers Chair/bed transfer  Transfers assist  Chair/bed transfer activity did not occur: Safety/medical concerns(nausea/vomitting)  Chair/bed transfer assist level: 2 Helpers     Locomotion Ambulation   Ambulation assist   Ambulation activity did not occur: Safety/medical concerns(decreased  strength/endurance, R LE NWB)          Walk 10 feet activity   Assist  Walk 10 feet activity did not occur: Safety/medical concerns(decreased strength/endurance, R LE NWB)        Walk 50 feet activity   Assist Walk 50 feet with 2 turns activity did not occur: Safety/medical concerns(decreased strength/endurance, R LE NWB)         Walk 150 feet activity   Assist Walk 150 feet activity did not occur: Safety/medical concerns(decreased strength/endurance, R LE NWB)         Walk 10 feet on uneven surface  activity   Assist Walk 10 feet on uneven surfaces activity did not occur: Safety/medical concerns(decreased strength/endurance, R LE NWB)         Wheelchair     Assist Will patient use wheelchair at discharge?: Yes Type of Wheelchair: Manual    Wheelchair assist level: Minimal Assistance - Patient > 75% Max wheelchair distance: [id number]    Wheelchair 50 feet with 2 turns activity    Assist    Wheelchair 50 feet with 2 turns activity did not occur: Safety/medical concerns(decreased UE strength/endurance)   Assist Level: Minimal Assistance - Patient > 75%   Wheelchair 150 feet activity     Assist Wheelchair 150 feet activity did not occur: Safety/medical concerns(decreased UE strength/endurance, R LE NWB)        Medical Problem List and Plan: 1.  Deficits with mobility, transfers, self-care secondary to right ankle fracture.  Cont CIR PT, OT  8/19- hasn't been able to participate as well due to ileus/hooked up to wall suction per therapy. 2.  Antithrombotics: -DVT/anticoagulation:  Pharmaceutical: Other (comment) Eliquis             -antiplatelet therapy: N/A 3. Pain Management: Continue oxycodone as needed, well controlled   -  voltaren gel for bilateral knee pain 4. Mood: Team to provide ego support for chronic anxiety.  LCSW to follow for evaluation and support             -antipsychotic agents: N/A 5. Neuropsych: This patient is not  fully capable of making decisions on her own behalf. 6. Skin/Wound Care: Monitor incision for healing daily.  Routine pressure relief measures 7. Fluids/Electrolytes/Nutrition: encourage PO  Continue protein supp for low albumin 8.  Acute on chronic renal failure: IVF stopped              -encourage PO fluids  8/12- BUN improved, IVF d/ced  8/18- on IVFs again, will allow pt PO water/ice chips  8/19- labs in AM  8/20- K+ 3,0 but BMP better otherwise 9. Iron deficiency anemia with ABLA: Continue iron supplement             Hemoglobin 8.3 on 8/12, stable at same 8/14 10. HTN: Monitor blood pressures and watch for signs of orthostasis.  Continue losartan, diltiazem, metoprolol, and furosemide daily.  Reasonable control at present  8/19- BP more elevated than nml- could be abd pain vs IVFs- will monitor and change meds if not improved in next 24-48 hrs.  8/20-  will increase diltiazem to 90 mg QID due to lower pulse, but higher BP Vitals:   07/30/19 0612 07/30/19 0615  BP: (!) 171/50 (!) 175/59  Pulse: 65 65  Resp:  18  Temp:  99 F (37.2 C)  SpO2:  95%  Controlled 8/16 11. Chronic diastolic CHF: Discontinue IV fluids. Continue losartan, metoprolol, furosemide.             Daily weights Filed Weights   07/29/19 0500 07/30/19 0500 07/30/19 0710  Weight: 107.6 kg 107.6 kg 107.6 kg    Stable on 8/14 12.  PAF: Monitor heart rate TID.  Continue diltiazem and metoprolol.             HR controlled at present on 8/13 13.  Anxiety disorder: Continue Celexa daily 14. Constipation/focal ileus/distal SBO:   -daily senna-s and miralax  --last bm 8/15  8/17- another partial SBO and ileus again on KUB- NGT placed again, IVFs 75 cc/hr and NPO otherwise- increase Senokot S to 2 tabs BID as well to prevent from occurring again. Will get KUB tomorrow to f/u.  8/18- NGT still in place/on suction; will allow water/ice chips, but check another KUB in AM to f/u.-   8/19- NGT to suction- still draining- on  IVFs 75/hr- will start liquid diet and hold wall suction when taking PO x 30 minutes-   8/20- d/c NGT and put back on regular diet as of lunch  15. Dispo- - d/c 8/21  8/17- will talk to SW about keeping longer due to SBO  -therapy asked if pt could go home with foley since cannot do transfers easily at all.  -pt needs to keep staples in until f/u with surgeon and maintain NWB, per PA, Pam Love note.  8/19- team conference today- will d/c 8/26 since slow progress form medical issues, however is still max assist of 2 for transfers- will need Hoyer lift at home, unfortunately since cannot maintain NWB on LE.  8/20- will d/w team new d/c date since slow progress/ileus/medical issues  LOS: 15 days A FACE TO FACE EVALUATION WAS PERFORMED  Prinston Kynard 07/30/2019, 11:55 AM

## 2019-07-30 NOTE — Plan of Care (Signed)
  Problem: Consults Goal: RH GENERAL PATIENT EDUCATION Description: See Patient Education module for education specifics. Outcome: Progressing   Problem: RH SKIN INTEGRITY Goal: RH STG SKIN FREE OF INFECTION/BREAKDOWN Description: min assistance  Outcome: Progressing Goal: RH STG MAINTAIN SKIN INTEGRITY WITH ASSISTANCE Description: STG Maintain Skin Integrity With min Assistance. Outcome: Progressing   Problem: RH SAFETY Goal: RH STG ADHERE TO SAFETY PRECAUTIONS W/ASSISTANCE/DEVICE Description: STG Adhere to Safety Precautions With supervisionAssistance/Device. Outcome: Progressing Goal: RH STG DECREASED RISK OF FALL WITH ASSISTANCE Description: STG Decreased Risk of Fall With supervision Assistance. Outcome: Progressing   Problem: RH PAIN MANAGEMENT Goal: RH STG PAIN MANAGED AT OR BELOW [name] PAIN GOAL Description: Pain < 3  Outcome: Progressing   Problem: RH KNOWLEDGE DEFICIT GENERAL Goal: RH STG INCREASE KNOWLEDGE OF SELF CARE AFTER HOSPITALIZATION Description: Min assistance with transfers, able to verbalize fall precautions and safety ideas for home Outcome: Progressing   

## 2019-07-30 NOTE — Progress Notes (Signed)
Occupational Therapy Weekly Progress Note  Patient Details  Name: Katherine Tyler MRN: [id number] Date of Birth: [birth year]  Beginning of progress report period: July 23, 2019 End of progress report period: July 30, 2019  Today's Date: 07/30/2019 OT Individual Time: 8786-7672 OT Individual Time Calculation (min): 55 min    Short term goals not set as prepping for d/c home this reporting period. However, pt with medical set back which limited her participation in therapy this week. D/C date extended to 8/26. Cont to work towards LTG and family education/training in prep for d/c.  Pt has progressed in her ability to complete bed mobility, now able to transfer supine>EOB with supervision and min A to return to supine from sitting EOB. She requires min-+2 assist for sliding board transfers depending on set-up of transfer and fatigue level. She cont to have difficulty maintaining R LE NWB during functional mobility and ADL tasks.  She is completing bathing/dressing routine seated on EOB, returning to supine for pericare/buttock hygiene.  Recommending Hoyer lift for d/c, however, pt's daughter would like to cont to work with sliding board transfers, planning to use sliding board in the AM when pt has more energy and use Hoyer lift when pt more fatigued. Have been completing hands on training with pt's approved visitor, her Daughter [name], however, they report pt's other children will be the ones providing more of the physical assist at d/c.   Patient continues to demonstrate the following deficits:muscle weakness, decreased cardiorespiratoy endurance and decreased standing balance, decreased postural control, decreased balance strategies and difficulty maintaining precautions and therefore will continue to benefit from skilled OT intervention to enhance overall performance with BADL and Reduce care partner burden.  Patient progressing toward long term goals..  Continue plan of care.  OT Short Term  Goals Week 2:  OT Short Term Goal 1 (Week 2): STG=LTG due to LOS OT Short Term Goal 1 - Progress (Week 2): Progressing toward goal Week 3:  OT Short Term Goal 1 (Week 3): D/C date extended, cont working towards LTG  Skilled Therapeutic Interventions/Progress Updates:    Pt seen for OT ADL bathing/dressing session. Pt in supine upon arrival, complaints of generalized soreness in R LE, reports being up to date on pain meds and agreeable to participate as able. She transferred to sitting EOB with min A for adjusting hips with use of chuck pad and using hospital bed functions. She sat EOB to complete bathing/dressing routine. Set-up/supervision for UB bathing and min A for clothing management to don shirt. SHe was able to reach to L ankle in order to wash L LE, assist for washing foot. Total A to thread B pants. She stood from EOB with mod A +2 and pants pulled up total A. PT required VCs for adherence to [place] pre-cautions, however, difficulty maintaining.   Completed mod A +2 sliding board transfer to w/c.  Grooming tasks completed from w/c level at sink with set-up assist, provided with shampoo shower cap.  In hallway, worked on pt propelling w/c for UE strengthening and technique for turning in prep for home w/c mobility. Completed with min A and VCs for technique. Pt returned to room at end of session, left sitting up in w/c with all needs in reach.   Therapy Documentation Precautions:  Precautions Precautions: Fall Restrictions Weight Bearing Restrictions: Yes RLE Weight Bearing: Non weight bearing   Therapy/Group: Individual Therapy  Malaki Koury L 07/30/2019, 6:59 AM

## 2019-07-30 NOTE — Progress Notes (Addendum)
Physical Therapy Session Note  Patient Details  Name: Katherine Tyler MRN: [id number] Date of Birth: 1929-12-03  Today's Date: 07/30/2019 PT Individual Time: 1300-1400 PT Individual Time Calculation (min): 60 min   Short Term Goals:  Week 2:  PT Short Term Goal 1 (Week 2): =LTG due to ELOS  Skilled Therapeutic Interventions/Progress Updates:  Pt resting in bed.  Using bed features, pt came to sitting EOB with close supervision and mod cues.  PT donned L shoe.  Slide board transfer to L bed> w/c slightly downhill, with mod/max asssist.  [name] dtr Boyd Kerbs in the room.  She participated in manipulating R ELR and placing pt's RLE onto it.  W/c propulsion on level tile x 50' with supervision and cues for turns and efficiency.  2 pillows placed behind her back to facilitate biomechanics for propulsion.  W/c> mat slide board transfer to L to mat, max assist.  Transfer training using press up blocks , siting EOM and with cues for forward wt shift, for scooting hips reciprocally forward/backward.  PT ensured that pt did not wt bearing tyhrough RLE. Transfer to R to return to w/c, mod assist.  Sit> stand in parallel bars, +2, including preventing wt bearing RLE.  Pt tolerated standing x 10 seconds x 2.  Pt had sudden buckling of L knee during 2nd bout of standing, sitting suddenly on edge of seat and requiring +2.  Pt reported OA bil knees. PT provided emotional support, as pt was startled by buckling.   At end of session, pt resting in w/c with needs at hand and dtr present.     Therapy Documentation Precautions:  Precautions Precautions: Fall Restrictions Weight Bearing Restrictions: Yes RLE Weight Bearing: Non weight bearing   Pain: pt denies        Therapy/Group: Individual Therapy  Margrete Delude 07/30/2019, 2:04 PM

## 2019-07-30 NOTE — Progress Notes (Signed)
Physical Therapy Weekly Progress Note  Patient Details  Name: Katherine Tyler MRN: [id number] Date of Birth: 04/27/1929  Beginning of progress report period: July 23, 2019 End of progress report period: July 30, 2019  Today's Date: 07/30/2019 PT Individual Time: 1115-1200; 1500-1520 PT Individual Time Calculation (min): 45 min and 20 min PT Missed Time: 10 min Missed Time Reason: fatigue  Patient has met 0 of 1 short term goals.  Short term goals were set at LTG due to ELOS. [name] LOS was extended due to ongoing medical issues with a SBO. Pt is making slow but steady progress towards LTG. Pt is currently min to mod A for bed mobility, assist x 2 for slide board transfers vs dependent for manual hoyer transfers once she is fatigued, is Supervision for w/c mobility x 50-60 ft with manual w/c, and is able to stand with assist x 2 in // bars but demos poor ability to maintain WBing precautions on RLE. Family education has been initiated with patient's daughter [name]. Therapy is recommending d/c home at w/c level with use of a manual hoyer for safe transfers to/from a hospital bed.   Patient continues to demonstrate the following deficits muscle weakness, decreased problem solving, decreased safety awareness and decreased memory and decreased standing balance, decreased postural control, decreased balance strategies and difficulty maintaining precautions and therefore will continue to benefit from skilled PT intervention to increase functional independence with mobility.  Patient progressing toward long term goals..  Continue plan of care.  PT Short Term Goals Week 2:  PT Short Term Goal 1 (Week 2): =LTG due to ELOS PT Short Term Goal 1 - Progress (Week 2): Progressing toward goal Week 3:  PT Short Term Goal 1 (Week 3): =LTG due to ELOS  Skilled Therapeutic Interventions/Progress Updates:    Session 1: Pt received seated in w/c in room, agreeable to PT session. No complaints of pain but does  report feeling fatigued. Manual w/c propulsion x 50 ft with use of BUE and Supervision before onset of fatigue. Session focus on seated balance and core strengthening. Seated unweighted ball punch-outs and OH lifts x 10 reps each with AAROM for OH lifts due to limited shoulder mobility. Seated ball toss reaching outside BOS to catch ball 2 x 5 min. Seated bean bag reach with focus on reaching outside BOS and activating core musculature vs using shoulder mobility to reach targets. Slide board transfer back to bed with mod A initially progressing to assist x 2 to safely complete transfer. Sit to supine mod A for BLE management. Pt left seated in bed with needs in reach, bed alarm in place at end of session.  Session 2: Pt received seated in w/c in room, reports feeling fatigued this PM and requests to return to bed. Pt reporting pain in RLE, not rated and declines intervention. [name] daughter [name] present during session. Reviewed therapy recommendations of patient transport home via ambulance as well as use of [facility] for transport to/from doctor's appointment due to patient's inability to complete car transfers safely. Pt and her daughter in agreement that this is safest option. [name] is able to setup pt in w/c for safe transfer w/c to bed with minor v/c. Slide board transfer w/c to bed with min to mod A x 2. Sit to supine mod A for BLE management. Supine to long-sitting with HOB elevated and use of bedrails for dependent doffing of shirt and donning of nightgown. Rolling L/R with min A to dependently doff pants.  Pt declines any further participation in session once back in bed. Pt left seated in bed with needs in reach, bed alarm in place, daughter present.   Therapy Documentation Precautions:  Precautions Precautions: Fall Restrictions Weight Bearing Restrictions: Yes RLE Weight Bearing: Non weight bearing   Therapy/Group: Individual Therapy   Excell Seltzer, PT, DPT  07/30/2019, 1:13 PM

## 2019-07-31 ENCOUNTER — Inpatient Hospital Stay (HOSPITAL_COMMUNITY): Payer: Medicare Other | Admitting: Occupational Therapy

## 2019-07-31 ENCOUNTER — Inpatient Hospital Stay (HOSPITAL_COMMUNITY): Payer: Medicare Other

## 2019-07-31 ENCOUNTER — Inpatient Hospital Stay (HOSPITAL_COMMUNITY): Payer: Medicare Other | Admitting: Physical Therapy

## 2019-07-31 LAB — BASIC METABOLIC PANEL
Anion gap: 7 (ref 5–15)
BUN: 17 mg/dL (ref 8–23)
CO2: 22 mmol/L (ref 22–32)
Calcium: 8.3 mg/dL — ABNORMAL LOW (ref 8.9–10.3)
Chloride: 111 mmol/L (ref 98–111)
Creatinine, Ser: 1.11 mg/dL — ABNORMAL HIGH (ref 0.44–1.00)
GFR calc Af Amer: 51 mL/min — ABNORMAL LOW (ref >60)
GFR calc non Af Amer: 44 mL/min — ABNORMAL LOW (ref >60)
Glucose, Bld: 89 mg/dL (ref 70–99)
Potassium: 3.9 mmol/L (ref 3.5–5.1)
Sodium: 140 mmol/L (ref 135–145)

## 2019-07-31 MED ORDER — DILTIAZEM HCL ER COATED BEADS 180 MG PO CP24
180.0000 mg | ORAL_CAPSULE | Freq: Two times a day (BID) | ORAL | Status: DC
  Administered 2019-07-31 – 2019-08-05 (×11): 180 mg via ORAL
  Filled 2019-07-31 (×11): qty 1

## 2019-07-31 MED ORDER — METOPROLOL SUCCINATE ER 25 MG PO TB24
25.0000 mg | ORAL_TABLET | Freq: Every day | ORAL | Status: DC
  Administered 2019-07-31 – 2019-08-05 (×6): 25 mg via ORAL
  Filled 2019-07-31 (×6): qty 1

## 2019-07-31 NOTE — Progress Notes (Signed)
Occupational Therapy Session Note  Patient Details  Name: Katherine Tyler MRN: [id number] Date of Birth: [birth year]  Today's Date: 07/31/2019 OT Individual Time: 8563-1497 OT Individual Time Calculation (min): 98 min    Short Term Goals: Week 3:  OT Short Term Goal 1 (Week 3): D/C date extended, cont working towards LTG  Skilled Therapeutic Interventions/Progress Updates:    Pt seen for OT session focusing on functional transfers, on-going family education and UE strengthening. Pt sitting up in w/c upon arrival with daughter [name] present. Pt denying pain and agreeable to tx session.  Education/discussion with pt and daughter regarding plan for toileting at d/c. Discussed option of Foley if MD approves and pros/cons of this option. Also discussed use of sliding board vs Hoyer lift to [facility] for urination if no foley and for BM. Pt does not like using Hoyer lift, however, has limited endurance needed for sliding board transfer on/off BSC.  Completed sliding board transfer w/c<> heavy duty drop arm BSC. With +2 max A, pt able to tolerate doing the 2 transfers in ~10 minute time span with rest breaks throughout. Max cuing also provided for weightshift, technique and maintaining of NWBing.  Following return to w/c, then completed sliding board transfer back to EOB with +2 assist and mod A to return to supine.  From supine, provided education and hands on training for bed mobility techniques, donning/doffing of pants from bed level, changing of brief and chuck pad. Pt completed rolling to R with min A for efficient roll and supervision rolling to L with use of hospital bed functions. Completed UE strengthening exercies from supine position using #2 dowel rod. x2 sets of 10 with multi-modal cuing for proper form and technique  Chest press  Overhead press  Bicep curl  Pt left in supine at end of session with daughter present, all needs in reach and bed alarm on.   Therapy Documentation Precautions:   Precautions Precautions: Fall Restrictions Weight Bearing Restrictions: Yes RLE Weight Bearing: Non weight bearing   Therapy/Group: Individual Therapy  Kymorah Korf L 07/31/2019, 7:03 AM

## 2019-07-31 NOTE — Progress Notes (Signed)
Physical Therapy Session Note  Patient Details  Name: Katherine Tyler MRN: [id number] Date of Birth: 1929-07-11  Today's Date: 07/31/2019 PT Individual Time: 1445-1530 PT Individual Time Calculation (min): 45 min   Short Term Goals: Week 3:  PT Short Term Goal 1 (Week 3): =LTG due to ELOS  Skilled Therapeutic Interventions/Progress Updates:    Pt received seated in bed, reports feeling fatigued but agreeable to PT session. Pt reports whole body soreness, not rated and declines intervention. Supine to sit with min A with HOB elevated and use of bedrails. Slide board transfer bed to w/c with mod A. Once in w/c therapist discovers that pt's pants are wet. Slide board transfer back to bed with max A. Sit to supine mod A for BLE management. Rolling L/R with min A for dependent pericare and brief change. Dependent to doff pants. Supine BLE strengthening therex: heel slides, hip abd, glute sets, SAQ. Pt left semi-reclined in bed with needs in reach at end of session, bed alarm in place, daughter present.  Therapy Documentation Precautions:  Precautions Precautions: Fall Restrictions Weight Bearing Restrictions: Yes RLE Weight Bearing: Non weight bearing    Therapy/Group: Individual Therapy   Excell Seltzer, PT, DPT  07/31/2019, 3:32 PM

## 2019-07-31 NOTE — Progress Notes (Signed)
Physical Therapy Session Note  Patient Details  Name: Katherine Tyler MRN: [id number] Date of Birth: 1929-09-24  Today's Date: 07/31/2019 PT Individual Time: 0804-0901 PT Individual Time Calculation (min): 57 min   Short Term Goals: Week 1:  PT Short Term Goal 1 (Week 1): Patient will perform bed mobility with supervision using LRAD. PT Short Term Goal 1 - Progress (Week 1): Not met PT Short Term Goal 2 (Week 1): Patient will perform transfers with max A of 1 person. PT Short Term Goal 2 - Progress (Week 1): Not met PT Short Term Goal 3 (Week 1): Patient will propel w/c >25 feet without a rest break. PT Short Term Goal 3 - Progress (Week 1): Met PT Short Term Goal 4 (Week 1): Patient will perform sit<>stand while maintaining NWB on R LE with max A of 1 person. PT Short Term Goal 4 - Progress (Week 1): Not met Week 2:  PT Short Term Goal 1 (Week 2): =LTG due to ELOS PT Short Term Goal 1 - Progress (Week 2): Progressing toward goal Week 3:  PT Short Term Goal 1 (Week 3): =LTG due to ELOS  Skilled Therapeutic Interventions/Progress Updates:      Therapy Documentation Precautions:  Precautions Precautions: Fall Restrictions Weight Bearing Restrictions: Yes RLE Weight Bearing: Non weight bearing AM Session:  Pain:  Denies pain this am   Therapeutic Activity:  Pt w/audible wheezing this am.  02 sats 91percent.  MD informed and assessed pt determining upper airway issue.    Rolling L and R w/use of rails and min assist for donning brief and pants w/total assist.  Supine to side to sit on edge of bed w/min assist of 1.  Pt able to maintain sitting balance w/ supervision and doff gown/donn clean shirt w/min assist. Pt able to wt shift to r elbow and maintain balance w/cga while therapist placed slding board.  Bed to wc via sliding board transfer w/mod assist, cues for mechanics, slowly but improved scooting today, no clearance achieved w/attempts to boost.  Max verbal cues to maintain wb  status on RLE.    wc propulsion x 37f w/bilat UE's w/vc's for efficiency of stroke.  wc to/from mat slowly but w/mod assist, therapist ensuring nwbing, cues for head/hips/mechanics.    Therapeutic Exercise:  Sit to stand in parallel bars w/therapist monitoring wb status RLE (pt has difficulty maintaining w/transition and tends to utilize TDWB vs NWB in standing when not performing AROM.  Static stand x 4 up to 1 min each while performing the following theres: Hip flexion, hip abd, hamstring curls x 8-12 each.  Therapist closely guarding L knee and second person guarding pt from behind.    Seating push up blocks 3x10 for UE strengthening w/cues for pacing.    Assessment:  While slowly improving, pt continues to require significant assistance w/transfers due to overall weakness and body habitus/obesity.  Would benefit from further skilled treatment to improve functional mobility and decrease caregiver burden.    Therapy/Group: Individual Therapy  BCallie Fielding PT   07/31/2019, 12:59 PM

## 2019-07-31 NOTE — Plan of Care (Signed)
  Problem: Consults Goal: RH GENERAL PATIENT EDUCATION Description: See Patient Education module for education specifics. Outcome: Progressing   Problem: RH SKIN INTEGRITY Goal: RH STG SKIN FREE OF INFECTION/BREAKDOWN Description: min assistance  Outcome: Progressing Goal: RH STG MAINTAIN SKIN INTEGRITY WITH ASSISTANCE Description: STG Maintain Skin Integrity With min Assistance. Outcome: Progressing   Problem: RH SAFETY Goal: RH STG ADHERE TO SAFETY PRECAUTIONS W/ASSISTANCE/DEVICE Description: STG Adhere to Safety Precautions With supervisionAssistance/Device. Outcome: Progressing Goal: RH STG DECREASED RISK OF FALL WITH ASSISTANCE Description: STG Decreased Risk of Fall With supervision Assistance. Outcome: Progressing   Problem: RH PAIN MANAGEMENT Goal: RH STG PAIN MANAGED AT OR BELOW [name] PAIN GOAL Description: Pain < 3  Outcome: Progressing   Problem: RH KNOWLEDGE DEFICIT GENERAL Goal: RH STG INCREASE KNOWLEDGE OF SELF CARE AFTER HOSPITALIZATION Description: Min assistance with transfers, able to verbalize fall precautions and safety ideas for home Outcome: Progressing   

## 2019-07-31 NOTE — Progress Notes (Signed)
[facility] PHYSICAL MEDICINE & REHABILITATION PROGRESS NOTE   Subjective/Complaints:  Pt reports feeling great today except PT thinks she's wheezy audibly.  Pt working on getting out of room/pushing manual w/c, and stood x 3-4 seconds with max assist of 2 in prallel bars today.  Pt specifically says not SOB and doesn't "notice the wheezy sounds"  ROS: Patient denies CP, SOB, cough, HA Objective:   No results found. Recent Labs    07/30/19 0504  WBC 4.4  HGB 7.8*  HCT 25.7*  PLT 231   Recent Labs    07/30/19 0504 07/31/19 0528  NA 140 140  K 3.0* 3.9  CL 109 111  CO2 22 22  GLUCOSE 86 89  BUN 18 17  CREATININE 1.03* 1.11*  CALCIUM 8.1* 8.3*    Intake/Output Summary (Last 24 hours) at 07/31/2019 1532 Last data filed at 07/31/2019 0852 Gross per 24 hour  Intake 360 ml  Output -  Net 360 ml     Physical Exam: Vital Signs Blood pressure (!) 160/50, pulse 61, temperature 98.1 F (36.7 C), resp. rate 19, height 5\' 3"  (1.6 m), weight 109.7 kg, SpO2 97 %. Constitutional: No distress . Vital signs and labs reviewed Up in manual w/c, pushing it herself prolonged time, with PT, also saw stood max assist of 2 x 3-4 seconds in parallel bars, NAD HEENT: EOMI, oral membranes moist- Cardiovascular: RRR without murmur.     Respiratory: CTA Bilaterally without wheezes or rales. Can hear upper airway sounds audibly, but not on lung exam  GI: normoactive BS, NT, ND, protuberant but less so noticed Musculoskeletal:    R>L LE edema Neurological: Alert HOH Motor: Bilateral upper extremities: 4-/5 proximal distal, stable Right lower extremity: Hip flexion, knee extension 4/5, wiggles toes without limitation  Left lower extremity: 4-4+/5 proximal to distal  Skin: Right foot with dressing remains CDI; R hand shows bluish brusing, likely from IVs Psychiatric: Normal mood. Normal behavior. Bright affect Almost jubilant   Assessment/Plan: 1. Functional deficits secondary to right  ankle fx which require 3+ hours per day of interdisciplinary therapy in a comprehensive inpatient rehab setting.  Physiatrist is providing close team supervision and 24 hour management of active medical problems listed below.  Physiatrist and rehab team continue to assess barriers to discharge/monitor patient progress toward functional and medical goals  Care Tool:  Bathing    Body parts bathed by patient: Right arm, Left arm, Chest, Abdomen, Face, Right upper leg, Left upper leg   Body parts bathed by helper: Front perineal area, Buttocks, Left lower leg Body parts n/a: Right lower leg   Bathing assist Assist Level: Minimal Assistance - Patient > 75%     Upper Body Dressing/Undressing Upper body dressing   What is the patient wearing?: Pull over shirt    Upper body assist Assist Level: Minimal Assistance - Patient > 75%    Lower Body Dressing/Undressing Lower body dressing      What is the patient wearing?: Incontinence brief, Pants     Lower body assist Assist for lower body dressing: 2 Helpers     Toileting Toileting Toileting Activity did not occur (Clothing management and hygiene only): Safety/medical concerns  Toileting assist Assist for toileting: 2 Helpers     Transfers Chair/bed transfer  Transfers assist  Chair/bed transfer activity did not occur: Safety/medical concerns(nausea/vomitting)  Chair/bed transfer assist level: Maximal Assistance - Patient 25 - 49%     Locomotion Ambulation   Ambulation assist   Ambulation activity  did not occur: Safety/medical concerns(decreased strength/endurance, R LE NWB)          Walk 10 feet activity   Assist  Walk 10 feet activity did not occur: Safety/medical concerns(decreased strength/endurance, R LE NWB)        Walk 50 feet activity   Assist Walk 50 feet with 2 turns activity did not occur: Safety/medical concerns(decreased strength/endurance, R LE NWB)         Walk 150 feet  activity   Assist Walk 150 feet activity did not occur: Safety/medical concerns(decreased strength/endurance, R LE NWB)         Walk 10 feet on uneven surface  activity   Assist Walk 10 feet on uneven surfaces activity did not occur: Safety/medical concerns(decreased strength/endurance, R LE NWB)         Wheelchair     Assist Will patient use wheelchair at discharge?: Yes Type of Wheelchair: Manual    Wheelchair assist level: Supervision/Verbal cueing Max wheelchair distance: 50    Wheelchair 50 feet with 2 turns activity    Assist    Wheelchair 50 feet with 2 turns activity did not occur: Safety/medical concerns(decreased UE strength/endurance)   Assist Level: Supervision/Verbal cueing   Wheelchair 150 feet activity     Assist Wheelchair 150 feet activity did not occur: Safety/medical concerns(decreased UE strength/endurance, R LE NWB)        Medical Problem List and Plan: 1.  Deficits with mobility, transfers, self-care secondary to right ankle fracture.  Cont CIR PT, OT  8/19- hasn't been able to participate as well due to ileus/hooked up to wall suction per therapy.  8/21- first day in gym in 1+ weeks 2.  Antithrombotics: -DVT/anticoagulation:  Pharmaceutical: Other (comment) Eliquis             -antiplatelet therapy: N/A 3. Pain Management: Continue oxycodone as needed, well controlled   -  voltaren gel for bilateral knee pain 4. Mood: Team to provide ego support for chronic anxiety.  LCSW to follow for evaluation and support             -antipsychotic agents: N/A 5. Neuropsych: This patient is not fully capable of making decisions on her own behalf. 6. Skin/Wound Care: Monitor incision for healing daily.  Routine pressure relief measures 7. Fluids/Electrolytes/Nutrition: encourage PO  Continue protein supp for low albumin 8.  Acute on chronic renal failure: IVF stopped              -encourage PO fluids  8/12- BUN improved, IVF  d/ced  8/18- on IVFs again, will allow pt PO water/ice chips  8/19- labs in AM  8/20- K+ 3,0 but BMP better otherwise  8/21- K+ up to 3.9 with repletion- will recheck Monday 9. Iron deficiency anemia with ABLA: Continue iron supplement             Hemoglobin 8.3 on 8/12, stable at same 8/14  8/21- Hb 7.8 yesterday- due to dilution effect? 10. HTN: Monitor blood pressures and watch for signs of orthostasis.  Continue losartan, diltiazem, metoprolol, and furosemide daily.  Reasonable control at present  8/19- BP more elevated than nml- could be abd pain vs IVFs- will monitor and change meds if not improved in next 24-48 hrs.  8/20- will increase diltiazem to 90 mg QID due to lower pulse, but higher BP  8/21- 158/48- changed Metoprolol and Diltiazem back to PO/longer acting forms. Vitals:   07/31/19 0446 07/31/19 1422  BP: (!) 158/48 (!) 160/50  Pulse:  64 61  Resp: 17 19  Temp: 97.6 F (36.4 C) 98.1 F (36.7 C)  SpO2: 95% 97%  Controlled 8/16 11. Chronic diastolic CHF: Discontinue IV fluids. Continue losartan, metoprolol, furosemide.             Daily weights Filed Weights   07/30/19 0500 07/30/19 0710 07/31/19 0633  Weight: 107.6 kg 107.6 kg 109.7 kg    Stable on 8/14 12.  PAF: Monitor heart rate TID.  Continue diltiazem and metoprolol.             HR controlled at present on 8/13 13.  Anxiety disorder: Continue Celexa daily 14. Constipation/focal ileus/distal SBO:   -daily senna-s and miralax  --last bm 8/15  8/17- another partial SBO and ileus again on KUB- NGT placed again, IVFs 75 cc/hr and NPO otherwise- increase Senokot S to 2 tabs BID as well to prevent from occurring again. Will get KUB tomorrow to f/u.  8/18- NGT still in place/on suction; will allow water/ice chips, but check another KUB in AM to f/u.-   8/19- NGT to suction- still draining- on IVFs 75/hr- will start liquid diet and hold wall suction when taking PO x 30 minutes-   8/20- d/c NGT and put back on regular  diet as of lunch  8/21- LBM today in early afternoon- liquid- incontinent;  15. Dispo- - d/c 8/21  8/17- will talk to SW about keeping longer due to SBO  -therapy asked if pt could go home with foley since cannot do transfers easily at all.  -pt needs to keep staples in until f/u with surgeon and maintain NWB, per PA, Pam Love note.  8/19- team conference today- will d/c 8/26 since slow progress form medical issues, however is still max assist of 2 for transfers- will need Hoyer lift at home, unfortunately since cannot maintain NWB on LE.  8/20- will d/w team new d/c date since slow progress/ileus/medical issues  LOS: 16 days A FACE TO FACE EVALUATION WAS PERFORMED  Rutger Salton 07/31/2019, 3:32 PM

## 2019-08-01 ENCOUNTER — Inpatient Hospital Stay (HOSPITAL_COMMUNITY): Payer: Medicare Other

## 2019-08-01 DIAGNOSIS — D62 Acute posthemorrhagic anemia: Secondary | ICD-10-CM

## 2019-08-01 MED ORDER — SODIUM CHLORIDE 0.9% FLUSH: 10.0000 mL | INTRAVENOUS | Status: DC | PRN

## 2019-08-01 NOTE — Progress Notes (Signed)
Physical Therapy Session Note  Patient Details  Name: Katherine Tyler MRN: [id number] Date of Birth: 17-Mar-1929  Today's Date: 08/01/2019 PT Individual Time: 1130-1157 PT Individual Time Calculation (min): 27 min   Short Term Goals: Week 3:  PT Short Term Goal 1 (Week 3): =LTG due to ELOS  Skilled Therapeutic Interventions/Progress Updates:    Functional rolling in bed using bedrail for support with supervision to don pants to prepare for OOB this session. Pt able to lift LE's to assist with threading of pants but total assist for pulling them up. Pt able to come to EOB with min assist and extra time using bed rails for support. LOB posterior requiring assist to scoot hip to even out. Mod assist for slideboard transfer from bed -> w/c with focus on technique, head/hips relationship, and using LLE to assist with clearing bottom and repositioning in the w/c. W/c propulsion training for functional mobility training, strengthening of UEs and overall cardiovascular endurance x 66' with supervision and 1 rest break needed. Cues for efficient technique.   Therapy Documentation Precautions:  Precautions Precautions: Fall Restrictions Weight Bearing Restrictions: Yes RLE Weight Bearing: Non weight bearing Pain: Denies pain during session.   Therapy/Group: Individual Therapy  Canary Brim Ivory Broad, PT, DPT, CBIS  08/01/2019, 12:06 PM

## 2019-08-01 NOTE — Plan of Care (Signed)
  Problem: Consults Goal: RH GENERAL PATIENT EDUCATION Description: See Patient Education module for education specifics. Outcome: Progressing   Problem: RH SKIN INTEGRITY Goal: RH STG SKIN FREE OF INFECTION/BREAKDOWN Description: min assistance  Outcome: Progressing Goal: RH STG MAINTAIN SKIN INTEGRITY WITH ASSISTANCE Description: STG Maintain Skin Integrity With min Assistance. Outcome: Progressing   Problem: RH SAFETY Goal: RH STG ADHERE TO SAFETY PRECAUTIONS W/ASSISTANCE/DEVICE Description: STG Adhere to Safety Precautions With supervisionAssistance/Device. Outcome: Progressing Goal: RH STG DECREASED RISK OF FALL WITH ASSISTANCE Description: STG Decreased Risk of Fall With supervision Assistance. Outcome: Progressing   Problem: RH PAIN MANAGEMENT Goal: RH STG PAIN MANAGED AT OR BELOW [name] PAIN GOAL Description: Pain < 3  Outcome: Progressing   Problem: RH KNOWLEDGE DEFICIT GENERAL Goal: RH STG INCREASE KNOWLEDGE OF SELF CARE AFTER HOSPITALIZATION Description: Min assistance with transfers, able to verbalize fall precautions and safety ideas for home Outcome: Progressing   

## 2019-08-01 NOTE — Progress Notes (Signed)
Katherine Tyler is a 85 y.o. female 03/17/1929 [id number]  Subjective: Patient reports feeling well today.  No pain, shortness of breath or other discomfort.  Objective: Vital signs in last 24 hours: Temp:  [98.1 F (36.7 C)] 98.1 F (36.7 C) (08/22 0450) Pulse Rate:  [55-66] 55 (08/22 0450) Resp:  [19-24] 24 (08/22 0450) BP: (151-174)/(46-54) 151/46 (08/22 0450) SpO2:  [94 %-98 %] 94 % (08/22 0450) Weight:  [110.2 kg] 110.2 kg (08/22 0515) Weight change: 2.6 kg Last BM Date: 08/01/19  Intake/Output from previous day: 08/21 0701 - 08/22 0700 In: 840 [P.O.:840] Out: -   Physical Exam General: No apparent distress   Pleasant & upright in bed. Obese Lungs: Normal effort. Lungs clear to auscultation, no crackles or wheezes. Cardiovascular: Regular rate and rhythm, non pitting 1+ BLE edema Musculoskeletal:  Neurovascularly intact   Lab Results: BMET    Component Value Date/Time   NA 140 07/31/2019 0528   NA 133 (L) 01/29/2019 1132   K 3.9 07/31/2019 0528   CL 111 07/31/2019 0528   CO2 22 07/31/2019 0528   GLUCOSE 89 07/31/2019 0528   BUN 17 07/31/2019 0528   BUN 37 (H) 01/29/2019 1132   CREATININE 1.11 (H) 07/31/2019 0528   CREATININE 1.11 (H) 04/16/2013 1155   CALCIUM 8.3 (L) 07/31/2019 0528   GFRNONAA 44 (L) 07/31/2019 0528   GFRNONAA 47 (L) 04/16/2013 1155   GFRAA 51 (L) 07/31/2019 0528   GFRAA 54 (L) 04/16/2013 1155   CBC    Component Value Date/Time   WBC 4.4 07/30/2019 0504   RBC 2.50 (L) 07/30/2019 0504   HGB 7.8 (L) 07/30/2019 0504   HGB 11.3 01/29/2019 1132   HCT 25.7 (L) 07/30/2019 0504   HCT 32.5 (L) 01/29/2019 1132   PLT 231 07/30/2019 0504   PLT 139 (L) 01/29/2019 1132   MCV 102.8 (H) 07/30/2019 0504   MCV 98 (H) 01/29/2019 1132   MCH 31.2 07/30/2019 0504   MCHC 30.4 07/30/2019 0504   RDW 15.1 07/30/2019 0504   RDW 12.8 01/29/2019 1132   LYMPHSABS 0.6 (L) 07/30/2019 0504   LYMPHSABS 0.8 08/18/2018 1014   MONOABS 0.3 07/30/2019 0504   EOSABS 0.1  07/30/2019 0504   EOSABS 0.1 08/18/2018 1014   BASOSABS 0.0 07/30/2019 0504   BASOSABS 0.0 08/18/2018 1014   CBG's (last 3):  No results for input(s): GLUCAP in the last 72 hours. LFT's Lab Results  Component Value Date   ALT 16 07/30/2019   AST 24 07/30/2019   ALKPHOS 52 07/30/2019   BILITOT 0.7 07/30/2019    Studies/Results: No results found.  Medications:  I have reviewed the patient's current medications. Scheduled Medications: . apixaban  5 mg Oral BID  . cholecalciferol  1,000 Units Oral Daily  . citalopram  20 mg Oral Daily  . diclofenac sodium  2 g Topical TID  . diltiazem  180 mg Oral BID  . docusate  100 mg Per Tube BID  . feeding supplement (PRO-STAT SUGAR FREE 64)  30 mL Oral BID  . furosemide  20 mg Oral QODAY  . levothyroxine  125 mcg Oral QAC breakfast  . losartan  100 mg Oral Daily  . magnesium oxide  400 mg Oral Daily  . metoprolol succinate  25 mg Oral Daily  . phenol  1 spray Mouth/Throat TID WC & HS  . polyethylene glycol  17 g Oral Daily  . senna-docusate  2 tablet Oral BID  . thiamine  100 mg  Oral Daily   PRN Medications: acetaminophen, alum & mag hydroxide-simeth, bisacodyl, diphenhydrAMINE, guaiFENesin-dextromethorphan, ondansetron (ZOFRAN) IV, oxyCODONE, polyethylene glycol, prochlorperazine **OR** prochlorperazine **OR** prochlorperazine, sodium chloride flush, traZODone  Assessment/Plan: Principal Problem:   Closed fracture of right fibula and tibia, sequela Active Problems:   Hypothyroidism   Essential hypertension   PAF (paroxysmal atrial fibrillation) (HCC)   Acute blood loss anemia   Length of stay, days: 17  1.  Functional deficits related to right ankle fracture.  Ongoing supportive inpatient rehab with PT, OT and medical management as ongoing.  Recently resumed activity following resolution of ileus 2.  Chronic renal insufficiency, CKD stage III.  Currently stabilized with ongoing low-dose IV fluids.  Watch for volume overload  as oral intake improves 3.  Hypertension.  Continue ongoing multi modal antihypertensive therapies and titrate as needed 4.  Acute blood loss anemia related to musculoskeletal fracture.  No evidence of ongoing bleeding and hemodynamically stable.  Iron discontinued because of prior ileus 5.  History of depression/GAD.  Continue SSRI.  Symptomatically controlled at present 6.  PAF.  Continue full dose anticoagulation with Eliquis as ongoing rate controlled and asymptomatic  Katherine Belmonte A. Asa Lente, MD 08/01/2019, 11:23 AM

## 2019-08-02 ENCOUNTER — Encounter (HOSPITAL_COMMUNITY): Payer: Medicare Other | Admitting: Occupational Therapy

## 2019-08-02 NOTE — Progress Notes (Signed)
Patient reports dozing off and woke with "heart beating too fast, then it felt like it was going to stop." HRR-64, checked apically. Denies CP, SOB or any other complaint, except right ankle discomfort. Reports feeling "fine now."Katherine Tyler, [name] A

## 2019-08-02 NOTE — Progress Notes (Signed)
Katherine Tyler is a 85 y.o. female 03/31/1929 [id number]  Subjective: Patient reports feeling well today.  No pain, shortness of breath or other discomfort. Dtr asks how long boot/cast to remain on RLE  Objective: Vital signs in last 24 hours: Temp:  [98.4 F (36.9 C)-99.2 F (37.3 C)] 98.4 F (36.9 C) (08/23 0406) Pulse Rate:  [58-64] 58 (08/23 0406) Resp:  [18-22] 22 (08/23 0406) BP: (156-162)/(47-55) 158/48 (08/23 0406) SpO2:  [96 %-99 %] 96 % (08/23 0406) Weight:  [409[111 kg] 111 kg (08/23 0410) Weight change: 0.8 kg Last BM Date: 08/02/19  Intake/Output from previous day: 08/22 0701 - 08/23 0700 In: 840 [P.O.:840] Out: -   Physical Exam General: No apparent distress   Pleasant & upright in bed. Obese Lungs: Normal effort. Lungs clear to auscultation, no crackles or wheezes. Cardiovascular: Regular rate and rhythm, non pitting 1+ BLE edema Musculoskeletal:  Neurovascularly intact   Lab Results: BMET    Component Value Date/Time   NA 140 07/31/2019 0528   NA 133 (L) 01/29/2019 1132   K 3.9 07/31/2019 0528   CL 111 07/31/2019 0528   CO2 22 07/31/2019 0528   GLUCOSE 89 07/31/2019 0528   BUN 17 07/31/2019 0528   BUN 37 (H) 01/29/2019 1132   CREATININE 1.11 (H) 07/31/2019 0528   CREATININE 1.11 (H) 04/16/2013 1155   CALCIUM 8.3 (L) 07/31/2019 0528   GFRNONAA 44 (L) 07/31/2019 0528   GFRNONAA 47 (L) 04/16/2013 1155   GFRAA 51 (L) 07/31/2019 0528   GFRAA 54 (L) 04/16/2013 1155   CBC    Component Value Date/Time   WBC 4.4 07/30/2019 0504   RBC 2.50 (L) 07/30/2019 0504   HGB 7.8 (L) 07/30/2019 0504   HGB 11.3 01/29/2019 1132   HCT 25.7 (L) 07/30/2019 0504   HCT 32.5 (L) 01/29/2019 1132   PLT 231 07/30/2019 0504   PLT 139 (L) 01/29/2019 1132   MCV 102.8 (H) 07/30/2019 0504   MCV 98 (H) 01/29/2019 1132   MCH 31.2 07/30/2019 0504   MCHC 30.4 07/30/2019 0504   RDW 15.1 07/30/2019 0504   RDW 12.8 01/29/2019 1132   LYMPHSABS 0.6 (L) 07/30/2019 0504   LYMPHSABS 0.8  08/18/2018 1014   MONOABS 0.3 07/30/2019 0504   EOSABS 0.1 07/30/2019 0504   EOSABS 0.1 08/18/2018 1014   BASOSABS 0.0 07/30/2019 0504   BASOSABS 0.0 08/18/2018 1014   CBG's (last 3):  No results for input(s): GLUCAP in the last 72 hours. LFT's Lab Results  Component Value Date   ALT 16 07/30/2019   AST 24 07/30/2019   ALKPHOS 52 07/30/2019   BILITOT 0.7 07/30/2019    Studies/Results: No results found.  Medications:  I have reviewed the patient's current medications. Scheduled Medications: . apixaban  5 mg Oral BID  . cholecalciferol  1,000 Units Oral Daily  . citalopram  20 mg Oral Daily  . diclofenac sodium  2 g Topical TID  . diltiazem  180 mg Oral BID  . docusate  100 mg Per Tube BID  . feeding supplement (PRO-STAT SUGAR FREE 64)  30 mL Oral BID  . furosemide  20 mg Oral QODAY  . levothyroxine  125 mcg Oral QAC breakfast  . losartan  100 mg Oral Daily  . magnesium oxide  400 mg Oral Daily  . metoprolol succinate  25 mg Oral Daily  . phenol  1 spray Mouth/Throat TID WC & HS  . polyethylene glycol  17 g Oral Daily  .  senna-docusate  2 tablet Oral BID  . thiamine  100 mg Oral Daily   PRN Medications: acetaminophen, alum & mag hydroxide-simeth, bisacodyl, diphenhydrAMINE, guaiFENesin-dextromethorphan, ondansetron (ZOFRAN) IV, oxyCODONE, polyethylene glycol, prochlorperazine **OR** prochlorperazine **OR** prochlorperazine, sodium chloride flush, traZODone  Assessment/Plan: Principal Problem:   Closed fracture of right fibula and tibia, sequela Active Problems:   Hypothyroidism   Essential hypertension   PAF (paroxysmal atrial fibrillation) (HCC)   Acute blood loss anemia   Length of stay, days: 18  1.  Functional deficits related to right ankle fracture.  Ongoing supportive inpatient rehab with PT, OT and medical management as ongoing.  Recently resumed activity following resolution of ileus 2.  Chronic renal insufficiency, CKD stage III.  IVF stopped due to  inability to maintain access (infiltration of midline and no PIV).  Recheck BMP in am 3.  Hypertension.  Continue ongoing multi modal antihypertensive therapies and titrate as needed 4.  Acute blood loss anemia related to musculoskeletal fracture.  No evidence of ongoing bleeding and hemodynamically stable.  Iron discontinued because of prior ileus 5.  History of depression/GAD.  Continue SSRI.  Symptomatically controlled at present 6.  PAF.  Continue full dose anticoagulation with Eliquis as ongoing rate controlled and asymptomatic  Katherine Tyler A. Asa Lente, MD 08/02/2019, 10:05 AM

## 2019-08-02 NOTE — Plan of Care (Signed)
  Problem: Consults Goal: RH GENERAL PATIENT EDUCATION Description: See Patient Education module for education specifics. Outcome: Progressing   Problem: RH SKIN INTEGRITY Goal: RH STG SKIN FREE OF INFECTION/BREAKDOWN Description: min assistance  Outcome: Progressing Goal: RH STG MAINTAIN SKIN INTEGRITY WITH ASSISTANCE Description: STG Maintain Skin Integrity With min Assistance. Outcome: Progressing   Problem: RH SAFETY Goal: RH STG ADHERE TO SAFETY PRECAUTIONS W/ASSISTANCE/DEVICE Description: STG Adhere to Safety Precautions With supervisionAssistance/Device. Outcome: Progressing Goal: RH STG DECREASED RISK OF FALL WITH ASSISTANCE Description: STG Decreased Risk of Fall With supervision Assistance. Outcome: Progressing   Problem: RH PAIN MANAGEMENT Goal: RH STG PAIN MANAGED AT OR BELOW [name] PAIN GOAL Description: Pain < 3  Outcome: Progressing   Problem: RH KNOWLEDGE DEFICIT GENERAL Goal: RH STG INCREASE KNOWLEDGE OF SELF CARE AFTER HOSPITALIZATION Description: Min assistance with transfers, able to verbalize fall precautions and safety ideas for home Outcome: Progressing   

## 2019-08-02 NOTE — Progress Notes (Signed)
Occupational Therapy Session Note  Patient Details  Name: Katherine Tyler MRN: [id number] Date of Birth: January 02, 1929  Today's Date: 08/02/2019 OT Individual Time: 1300-1400 OT Individual Time Calculation (min): 60 min    Short Term Goals: Week 3:  OT Short Term Goal 1 (Week 3): D/C date extended, cont working towards LTG  Skilled Therapeutic Interventions/Progress Updates:    Pt seen for OT family education session. Pt in supine upon arrival, agreeable to tx session. [name] 4 daughters present for hands on family training. Hands on training, demonstration and education provided regarding the following:  Completed sliding board transfer EOB<> w/c, mod A +2 with assist in front and back.  Placement and use of Hoyer lift from w/c. W/C positioning including reciprocal scooting and importance of proper positioning when sitting in w/c Bed mobility, rolling R<>L, scooting up in bed, rolling for clothing management, and rolling to place bed pan and complete pericare/buttock hygiene. Education regarding [facility] pre-cautions, activity progression, DME, continuum of care, and d/c planning.  Family voiced understanding with all education provided. Offered to schedule another family ed session if needed prior to d/c.   Pt left in supine at end of session with family present and all needs in reach.   Therapy Documentation Precautions:  Precautions Precautions: Fall Restrictions Weight Bearing Restrictions: Yes RLE Weight Bearing: Non weight bearing   Therapy/Group: Individual Therapy  Abagale Boulos L 08/02/2019, 6:52 AM

## 2019-08-03 ENCOUNTER — Inpatient Hospital Stay (HOSPITAL_COMMUNITY): Payer: Medicare Other

## 2019-08-03 ENCOUNTER — Inpatient Hospital Stay (HOSPITAL_COMMUNITY): Payer: Medicare Other | Admitting: Physical Therapy

## 2019-08-03 ENCOUNTER — Inpatient Hospital Stay (HOSPITAL_COMMUNITY): Payer: Medicare Other | Admitting: Occupational Therapy

## 2019-08-03 LAB — CBC
HCT: 27.2 % — ABNORMAL LOW (ref 36.0–46.0)
Hemoglobin: 8.3 g/dL — ABNORMAL LOW (ref 12.0–15.0)
MCH: 31 pg (ref 26.0–34.0)
MCHC: 30.5 g/dL (ref 30.0–36.0)
MCV: 101.5 fL — ABNORMAL HIGH (ref 80.0–100.0)
Platelets: 194 10*3/uL (ref 150–400)
RBC: 2.68 MIL/uL — ABNORMAL LOW (ref 3.87–5.11)
RDW: 14.7 % (ref 11.5–15.5)
WBC: 3.3 10*3/uL — ABNORMAL LOW (ref 4.0–10.5)
nRBC: 0 % (ref 0.0–0.2)

## 2019-08-03 LAB — BASIC METABOLIC PANEL
Anion gap: 8 (ref 5–15)
BUN: 11 mg/dL (ref 8–23)
CO2: 24 mmol/L (ref 22–32)
Calcium: 8.3 mg/dL — ABNORMAL LOW (ref 8.9–10.3)
Chloride: 107 mmol/L (ref 98–111)
Creatinine, Ser: 1.11 mg/dL — ABNORMAL HIGH (ref 0.44–1.00)
GFR calc Af Amer: 51 mL/min — ABNORMAL LOW (ref >60)
GFR calc non Af Amer: 44 mL/min — ABNORMAL LOW (ref >60)
Glucose, Bld: 96 mg/dL (ref 70–99)
Potassium: 3.4 mmol/L — ABNORMAL LOW (ref 3.5–5.1)
Sodium: 139 mmol/L (ref 135–145)

## 2019-08-03 MED ORDER — POTASSIUM CHLORIDE CRYS ER 20 MEQ PO TBCR
20.0000 meq | EXTENDED_RELEASE_TABLET | Freq: Every day | ORAL | Status: DC
  Administered 2019-08-03 – 2019-08-05 (×3): 20 meq via ORAL
  Filled 2019-08-03 (×3): qty 1

## 2019-08-03 MED ORDER — POTASSIUM CHLORIDE CRYS ER 20 MEQ PO TBCR
40.0000 meq | EXTENDED_RELEASE_TABLET | Freq: Once | ORAL | Status: AC
  Administered 2019-08-03: 40 meq via ORAL
  Filled 2019-08-03: qty 2

## 2019-08-03 NOTE — Progress Notes (Signed)
  Patient ID: DALLY OSHEL, female   DOB: 03-25-1929, 85 y.o.   MRN: [id number]     Diagnosis codes:  T59.741U;  I50.32; E66.9  Height:  5'3"          Weight:    244 lbs        Patient suffers from tib/fib fx (non-weight bearing), CHF and obesity which impairs their ability to perform daily activities like toileting, dressing and mobiltiy in the home.  A walker will not resolve issue with performing activities of daily living.  A wheelchair will allow patient to safely perform daily activities.  Patient is not able to propel themselves in the home using a standard weight wheelchair due to severe weakness.  Patient can self propel in the lightweight wheelchair.  Reesa Chew, PA-C

## 2019-08-03 NOTE — Progress Notes (Signed)
Patient ID: Katherine Tyler, female   DOB: [birth year], 85 y.o.   MRN: [id number]   LOS: 19 days   Subjective: Doing well from ankle standpoint.   Objective: Vital signs in last 24 hours: Temp:  [98 F (36.7 C)-98.6 F (37 C)] 98.1 F (36.7 C) (08/24 0541) Pulse Rate:  [58-72] 68 (08/24 0543) Resp:  [18-19] 18 (08/24 0541) BP: (146-182)/(48-79) 182/68 (08/24 0543) SpO2:  [96 %-97 %] 96 % (08/24 0541) Weight:  [110.5 kg] 110.5 kg (08/24 0411) Last BM Date: 08/02/19   Laboratory  CBC Recent Labs    08/03/19 0628  WBC 3.3*  HGB 8.3*  HCT 27.2*  PLT 194   BMET Recent Labs    08/03/19 0628  NA 139  K 3.4*  CL 107  CO2 24  GLUCOSE 96  BUN 11  CREATININE 1.11*  CALCIUM 8.3*     Physical Exam General appearance: alert and no distress  RLE: Splint removed, swelling improved. Incisions C/D/I, sutures removed.    Assessment/Plan: Right ankle fx s/p ORIF -- Will place cast, f/u with Dr. Doran Durand after discharge.    Lisette Abu, PA-C Orthopedic Surgery [phone] 08/03/2019

## 2019-08-03 NOTE — Progress Notes (Signed)
Physical Therapy Session Note  Patient Details  Name: Katherine Tyler MRN: [id number] Date of Birth: 20-Jun-1929  Today's Date: 08/03/2019     Short Term Goals:  Week 3:  PT Short Term Goal 1 (Week 3): =LTG due to ELOS  Skilled Therapeutic Interventions/Progress Updates:  Nsg in room , removing staples from pt's r ankle.  Hard cast in planned.  PT returned in 10 minutes and Ortho Tech applying cast.     Therapy Documentation Precautions:  Precautions Precautions: Fall Restrictions Weight Bearing Restrictions: Yes RLE Weight Bearing: Non weight bearing General: PT Amount of Missed Time (min): 60 Minutes PT Missed Treatment Reason: Other (Comment);Nursing care(Nsg removing R ankle staples and Ortho then applying cast)      Therapy/Group: Individual Therapy  Fusako Tanabe 08/03/2019, 1:45 PM

## 2019-08-03 NOTE — Progress Notes (Signed)
Occupational Therapy Session Note  Patient Details  Name: Katherine Tyler MRN: [id number] Date of Birth: 03/01/1929  Today's Date: 08/03/2019 OT Individual Time: 6222-9798 OT Individual Time Calculation (min): 70 min    Short Term Goals: Week 3:  OT Short Term Goal 1 (Week 3): D/C date extended, cont working towards LTG  Skilled Therapeutic Interventions/Progress Updates:    Pt seen for OT ADL bathing/dressing session. Pt awake in supine upon arrival with daughter present. Pt denying pain and agreeable to tx session. She transferred to sitting EOB with min A and multi-modal cuing for sequencing/technique.  She completed bathing/dressing routine seated EOB, assist for washing L foot and pericare/buttock hygiene completed total A from bed level.  [name] daughter assisted with sliding board transfer to w/c, mod A overall with min cuing from therapist for technique. Pt completed grooming tasks from w/c level at sink with set-up. Education provided regarding modified techniques for w/c level grooming tasks and hair washing at home.  In therapy gym, completed functional reaching task from w/c level, pt required to come from supported sitting to using core to bring self up right to unsupported sitting then tossing bean bag. Completed x3 sets of 5 with rest breaks throughout. Pt returned to room at end of session and left seated in w/c with all needs in reach and daughter present.   Therapy Documentation Precautions:  Precautions Precautions: Fall Restrictions Weight Bearing Restrictions: Yes RLE Weight Bearing: Non weight bearing   Therapy/Group: Individual Therapy  Maela Takeda L 08/03/2019, 7:05 AM

## 2019-08-03 NOTE — Progress Notes (Signed)
[place] PHYSICAL MEDICINE & REHABILITATION PROGRESS NOTE   Subjective/Complaints:  Pt reports feeling good this AM- had place on forehead kept scratching so they covered with bandage.  Asking qwhen they will remove "uniboot cast of RLE". LBM this AM- going regularly, per pt.  Refusing colace- K+ 3.4  ROS: Patient denies CP, SOB, cough, HA Objective:   No results found. Recent Labs    08/03/19 0628  WBC 3.3*  HGB 8.3*  HCT 27.2*  PLT 194   Recent Labs    08/03/19 0628  NA 139  K 3.4*  CL 107  CO2 24  GLUCOSE 96  BUN 11  CREATININE 1.11*  CALCIUM 8.3*    Intake/Output Summary (Last 24 hours) at 08/03/2019 1040 Last data filed at 08/03/2019 0800 Gross per 24 hour  Intake 480 ml  Output -  Net 480 ml     Physical Exam: Vital Signs Blood pressure (!) 182/68, pulse 68, temperature 98.1 F (36.7 C), resp. rate 18, height 5\' 3"  (1.6 m), weight 110.5 kg, SpO2 96 %. Constitutional: No distress . Vital signs and labs reviewed Sitting up in bed, bandage on forehead between eyes (was scratching), bruises noted on hands/wrists- chronic/stable; bright affect, NAD HEENT: EOMI, oral membranes moist-bandage on head Cardiovascular: RRR without murmur.     Respiratory: CTA Bilaterally without wheezes or rales. GI: normoactive BS, NT, ND, protuberant - much less Musculoskeletal:    R>L LE edema; neurovascularly intact distal to cast on RLE Neurological: Alert HOH Motor: Bilateral upper extremities: 4-/5 proximal distal, stable Right lower extremity: Hip flexion, knee extension 4/5, wiggles toes without limitation  Left lower extremity: 4-4+/5 proximal to distal  Skin: Right foot with dressing remains CDI; hands show bruising B/L- bluish Psychiatric: Normal mood. Normal behavior. Bright affect    Assessment/Plan: 1. Functional deficits secondary to right ankle fx which require 3+ hours per day of interdisciplinary therapy in a comprehensive inpatient rehab  setting.  Physiatrist is providing close team supervision and 24 hour management of active medical problems listed below.  Physiatrist and rehab team continue to assess barriers to discharge/monitor patient progress toward functional and medical goals  Care Tool:  Bathing    Body parts bathed by patient: Right arm, Left arm, Chest, Abdomen, Face, Right upper leg, Left upper leg   Body parts bathed by helper: Front perineal area, Buttocks, Left lower leg Body parts n/a: Right lower leg   Bathing assist Assist Level: Minimal Assistance - Patient > 75%     Upper Body Dressing/Undressing Upper body dressing   What is the patient wearing?: Pull over shirt    Upper body assist Assist Level: Supervision/Verbal cueing    Lower Body Dressing/Undressing Lower body dressing      What is the patient wearing?: Pants     Lower body assist Assist for lower body dressing: Total Assistance - Patient < 25%     Toileting Toileting Toileting Activity did not occur Press photographer(Clothing management and hygiene only): Safety/medical concerns  Toileting assist Assist for toileting: Total Assistance - Patient < 25%     Transfers Chair/bed transfer  Transfers assist  Chair/bed transfer activity did not occur: Safety/medical concerns(nausea/vomitting)  Chair/bed transfer assist level: Moderate Assistance - Patient 50 - 74%     Locomotion Ambulation   Ambulation assist   Ambulation activity did not occur: Safety/medical concerns(decreased strength/endurance, R LE NWB)          Walk 10 feet activity   Assist  Walk 10 feet activity  did not occur: Safety/medical concerns(decreased strength/endurance, R LE NWB)        Walk 50 feet activity   Assist Walk 50 feet with 2 turns activity did not occur: Safety/medical concerns(decreased strength/endurance, R LE NWB)         Walk 150 feet activity   Assist Walk 150 feet activity did not occur: Safety/medical concerns(decreased  strength/endurance, R LE NWB)         Walk 10 feet on uneven surface  activity   Assist Walk 10 feet on uneven surfaces activity did not occur: Safety/medical concerns(decreased strength/endurance, R LE NWB)         Wheelchair     Assist Will patient use wheelchair at discharge?: Yes Type of Wheelchair: Manual    Wheelchair assist level: Supervision/Verbal cueing Max wheelchair distance: 5'    Wheelchair 50 feet with 2 turns activity    Assist    Wheelchair 50 feet with 2 turns activity did not occur: Safety/medical concerns(decreased UE strength/endurance)   Assist Level: Supervision/Verbal cueing   Wheelchair 150 feet activity     Assist Wheelchair 150 feet activity did not occur: Safety/medical concerns(decreased UE strength/endurance, R LE NWB)        Medical Problem List and Plan: 1.  Deficits with mobility, transfers, self-care secondary to right ankle fracture.  Cont CIR PT, OT  8/19- hasn't been able to participate as well due to ileus/hooked up to wall suction per therapy.  8/21- first day in gym in 1+ weeks 2.  Antithrombotics: -DVT/anticoagulation:  Pharmaceutical: Other (comment) Eliquis             -antiplatelet therapy: N/A 3. Pain Management: Continue oxycodone as needed, well controlled   -  voltaren gel for bilateral knee pain 4. Mood: Team to provide ego support for chronic anxiety.  LCSW to follow for evaluation and support             -antipsychotic agents: N/A 5. Neuropsych: This patient is not fully capable of making decisions on her own behalf. 6. Skin/Wound Care: Monitor incision for healing daily.  Routine pressure relief measures 7. Fluids/Electrolytes/Nutrition: encourage PO  Continue protein supp for low albumin 8.  Acute on chronic renal failure: IVF stopped              -encourage PO fluids  8/12- BUN improved, IVF d/ced  8/18- on IVFs again, will allow pt PO water/ice chips  8/19- labs in AM  8/20- K+ 3,0 but BMP  better otherwise  8/21- K+ up to 3.9 with repletion- will recheck Monday  8/24- K+ 3.4- will replete and add daily K+=  9. Iron deficiency anemia with ABLA: Continue iron supplement             Hemoglobin 8.3 on 8/12, stable at same 8/14  8/21- Hb 7.8 yesterday- due to dilution effect? 10. HTN: Monitor blood pressures and watch for signs of orthostasis.  Continue losartan, diltiazem, metoprolol, and furosemide daily.  Reasonable control at present  8/19- BP more elevated than nml- could be abd pain vs IVFs- will monitor and change meds if not improved in next 24-48 hrs.  8/20- will increase diltiazem to 90 mg QID due to lower pulse, but higher BP  8/21- 158/48- changed Metoprolol and Diltiazem back to PO/longer acting forms.  8/24- BP variable/spiked x1 Vitals:   08/03/19 0541 08/03/19 0543  BP: (!) 146/79 (!) 182/68  Pulse: 69 68  Resp: 18   Temp: 98.1 F (36.7 C)  SpO2: 96%   Controlled 8/16 11. Chronic diastolic CHF: Discontinue IV fluids. Continue losartan, metoprolol, furosemide.             Daily weights Filed Weights   08/01/19 0515 08/02/19 0410 08/03/19 0411  Weight: 110.2 kg 111 kg 110.5 kg    Stable on 8/14  8/24- weight stable 12.  PAF: Monitor heart rate TID.  Continue diltiazem and metoprolol.             HR controlled at present on 8/13 13.  Anxiety disorder: Continue Celexa daily 14. Constipation/focal ileus/distal SBO:   -daily senna-s and miralax  --last bm 8/15  8/17- another partial SBO and ileus again on KUB- NGT placed again, IVFs 75 cc/hr and NPO otherwise- increase Senokot S to 2 tabs BID as well to prevent from occurring again. Will get KUB tomorrow to f/u.  8/18- NGT still in place/on suction; will allow water/ice chips, but check another KUB in AM to f/u.-   8/19- NGT to suction- still draining- on IVFs 75/hr- will start liquid diet and hold wall suction when taking PO x 30 minutes-   8/20- d/c NGT and put back on regular diet as of lunch  8/21- LBM  today in early afternoon- liquid- incontinent;   8/24- refusing colace- LBM this AM- regular, per pt. 15. Dispo- - d/c 8/21  8/17- will talk to SW about keeping longer due to SBO  -therapy asked if pt could go home with foley since cannot do transfers easily at all.  -pt needs to keep staples in until f/u with surgeon and maintain NWB, per PA, Pam Love note.  8/19- team conference today- will d/c 8/26 since slow progress form medical issues, however is still max assist of 2 for transfers- will need Hoyer lift at home, unfortunately since cannot maintain NWB on LE.  8/20- will d/w team new d/c date since slow progress/ileus/medical issues  LOS: 19 days A FACE TO FACE EVALUATION WAS PERFORMED  Yoshiko Keleher 08/03/2019, 10:40 AM

## 2019-08-03 NOTE — Progress Notes (Signed)
Physical Therapy Session Note  Patient Details  Name: Katherine Tyler MRN: [id number] Date of Birth: [birth year]  Today's Date: 08/03/2019 PT Individual Time:1115-1200; 1017-5102 PT Individual Time Calculation (min): 45 min and 25 min   Short Term Goals: Week 3:  PT Short Term Goal 1 (Week 3): =LTG due to ELOS  Skilled Therapeutic Interventions/Progress Updates:    Session 1: Pt received seated in w/c in room, agreeable to PT session. Pt reports soreness in RLE, able to receive pain medication at end of session. Demonstrated to patient and her daughter [name] how to adjust RLE elevating leg rest for improved comfort. Dependent transport to/from therapy gym in w/c for time conservation. Sit to stand x 4 reps in // bars with mod to max A. Pt demos improved ability to maintain NWBing on RLE in standing. Verbal cues and tactile cues for anterior weight shift with sit to stand transfer. Slide board transfer w/c to mat table with max A x 2. Forwards/backwards scooting on mat table with multimodal cueing for contralateral lean during scooting. Attempt to have pt perform lateral scooting L/R, pt still unable to unweight to perform effective lateral scooting on mat table. Slide board transfer back to w/c with max A. Pt left seated in w/c in room with needs in reach at end of session, daughter present.  Session 2: Pt received semi-reclined in bed, agreeable to PT session. No complaints of pain this PM. Session focus on bed mobility and use of hospital bed features to assist with bed mobility. Supine to sitting EOB with HOB elevated and use of bedrail with Supervision. Sit to supine mod A for BLE management. Pt is able to perform supine to/from sit 2x. Rolling L/R with Supervision to adjust pad on bed. Pt is able to scoot herself up towards [facility] with Supervision and cueing with use of bed features. Provided handout of LE strengthening therex HEP for pt to continue with at home. Pt left semi-reclined in bed with needs  in reach, daughter present at end of session.  Therapy Documentation Precautions:  Precautions Precautions: Fall Restrictions Weight Bearing Restrictions: Yes RLE Weight Bearing: Non weight bearing    Therapy/Group: Individual Therapy   Excell Seltzer, PT, DPT  08/03/2019, 3:34 PM

## 2019-08-03 NOTE — Progress Notes (Signed)
  Patient ID: Katherine Tyler, female   DOB: Jan 29, 1929, 85 y.o.   MRN: [id number]    Diagnosis code:  O70.962E; I50.32 and E66.9  Height:  5'3"  Weight:  244 lbs   Patient has a right tib/fib fx, morbid obesity and CHF which requires both upper and lower extremeties to be positioned in ways not feasible with a normal bed.  Head must be elevated at least 30 degress or she will experience harder time breathing.   Her lower extremity fx requires frequent and immediate changes in body position which cannot be achieved with a normal bed for pain management.  She is also dependent on hoyer lift for all bed transfers.   Reesa Chew, PA-C

## 2019-08-03 NOTE — Progress Notes (Signed)
Physical Therapy Session Note  Patient Details  Name: Katherine Tyler MRN: [id number] Date of Birth: 1929/03/04  Today's Date: 08/03/2019 PT Individual Time: 1415-1435 PT Individual Time Calculation (min): 20 min   Short Term Goals: Week 3:  PT Short Term Goal 1 (Week 3): =LTG due to ELOS  Skilled Therapeutic Interventions/Progress Updates:     Patient in w/c with her daughter in the room upon PT arrival. Patient alert and agreeable to making up missed time from earlier PT session scheduled at the time patient was have a cast placed on there R LE. Patient reported that she wanted to get back to bed, but wait to perform any exercises until her next PT session at 3:00pm. Patient's wound on her forehead began to bleed slightly, PT cleaned the wound and applied a folded 2x2 gauze dressing with paper tape to prevent further bleeding, RN made aware. Patient denied pain throughout session.   Therapeutic Activity: Bed Mobility: Patient performed sit to supine  with mod A for B LE managment. Provided verbal cues for lowering her trunk as PT assisted to lift LEs up to the bed for increased momentum. She required mod A of 2 for scooting up in bed x2 with use of bed rails and L LE to assist with mobility.  Transfers: Patient performed a slide board transfer from the w/c to the bed x1 with mod A of 2, patient's daughter assisted with transfer and participated in education on transfer technique. Patient's daughter provided cues for board placement and head-hips relationship using teach back method during transfers and PT provided cues for w/c set up, hand placement, and NWB on R LE.  Patient in bed with R LE elevated on a pillow and her daughter in the room at end of session with breaks locked, bed alarm set, and all needs within reach.    Therapy Documentation Precautions:  Precautions Precautions: Fall Restrictions Weight Bearing Restrictions: Yes RLE Weight Bearing: Non weight  bearing    Therapy/Group: Individual Therapy  Roby Spalla L Jessamine Barcia PT, DPT  08/03/2019, 3:58 PM

## 2019-08-03 NOTE — Progress Notes (Signed)
Orthopedic Tech Progress Note Patient Details:  Katherine Tyler December 07, 1929 [id number]  Casting Type of Cast: Short leg cast Cast Material: [employer] T 08/03/2019, 1:31 PM

## 2019-08-04 ENCOUNTER — Inpatient Hospital Stay (HOSPITAL_COMMUNITY): Payer: Medicare Other | Admitting: Physical Therapy

## 2019-08-04 ENCOUNTER — Inpatient Hospital Stay (HOSPITAL_COMMUNITY): Payer: Medicare Other | Admitting: Occupational Therapy

## 2019-08-04 NOTE — Progress Notes (Signed)
Subjective:    The patient is now 23 days post op from ORIF of right ankle trimal fracture dislocation with syndesmosis disruption.  She is in rehab.  Yesterday at my direction her splint was removed along with her sutures.  She was casted.  Today the nursing staff noted drainage from the cast at her heel.  I was called to see the patient.  She denies any pain.  No f/c/n/v.  She is scheduled to go home tomorrow.  Objective:   Vital signs in last 24 hours: Temp:  [98 F (36.7 C)-98.2 F (36.8 C)] 98 F (36.7 C) (08/25 1539) Pulse Rate:  [57-67] 57 (08/25 1539) Resp:  [17-18] 17 (08/25 1539) BP: (154-174)/(41-50) 174/41 (08/25 1539) SpO2:  [96 %-99 %] 96 % (08/25 1539)  Intake/Output from previous day: 08/24 0701 - 08/25 0700 In: 594 [P.O.:594] Out: -  Intake/Output this shift: Total I/O In: 118 [P.O.:118] Out: -   Recent Labs    08/03/19 0628  HGB 8.3*   Recent Labs    08/03/19 0628  WBC 3.3*  RBC 2.68*  HCT 27.2*  PLT 194   Recent Labs    08/03/19 0628  NA 139  K 3.4*  CL 107  CO2 24  BUN 11  CREATININE 1.11*  GLUCOSE 96  CALCIUM 8.3*   No results for input(s): LABPT, INR in the last 72 hours.  PE:  Obese elderly female in nad.  R LE cast removed.  Medial incision is closed with no apparent drainage.  Lateral incision is closed with no apparent drainage.  Heel is carefully inspected with no sign of ulcer or pressure sore.  There is no signficant swelling.  No erythema, warmth or tenderness.  Sens to LT is intact at the foot dorsally and plantarly.     Assessment/Plan:   R ankle trimal fracture dislocation - I can't explain the source of drainage at the patient's cast.  Her incisions show no dehiscence.  There are no signs of infection.  No fracture blisters.  I reapplied dry dressings and a short leg splint.  She is still NWB on the right lower extremity.  She is safe to discharge home.  She should follow up with me in the office in 3 weeks.  She and her family  understand this plan and agree.      Wylene Simmer 08/04/2019, 5:06 PM

## 2019-08-04 NOTE — Progress Notes (Signed)
Orthopedic Tech Progress Note Patient Details:  Katherine Tyler 01-13-1929 [id number] RN called saying DR was coming to look at leg, needed cast removed and reapply short leg post with stirrups assisted by DR himself. He added tape around splint also. Said he would see her in a few weeks Ortho Devices Type of Ortho Device: Short leg splint, Stirrup splint Ortho Device/Splint Location: LRE Ortho Device/Splint Interventions: Adjustment, Application, Ordered   Post Interventions Patient Tolerated: Well Instructions Provided: Care of device, Adjustment of device   Janit Pagan 08/04/2019, 5:03 PM

## 2019-08-04 NOTE — Progress Notes (Signed)
Physical Therapy Discharge Summary  Patient Details  Name: OTTILIE WIGGLESWORTH MRN: [id number] Date of Birth: 1929/08/30  Today's Date: 08/04/2019 PT Individual Time: 1415-1530 PT Individual Time Calculation (min): 75 min    Patient has met 6 of 8 long term goals due to improved activity tolerance, improved postural control, increased strength, increased range of motion and ability to compensate for deficits.  Patient to discharge at a wheelchair level Supervision for wheelchair mobility but dependent for transfers bed to/from chair via manual hoyer lift vs slide board.   Patient's care partner is independent to provide the necessary physical assistance at discharge. Patient's adult children have completed hands-on family education throughout her stay on rehab and are safe to assist pt with transfers and mobility upon d/c home.  Reasons goals not met: Pt did not meet min A level for sit to stand transfer, pt continues to require mod to max A for sit to stand due to body habitus and RLE NWBing precautions. Pt has met goals adequately for a safe d/c home.  Recommendation:  Patient will benefit from ongoing skilled PT services in home health setting to continue to advance safe functional mobility, address ongoing impairments in strength, ROM, safety, independence with functional mobility, and minimize fall risk.  Equipment: 20x20 w/c with ELR, slide board, hospital bed, manual hoyer  Reasons for discharge: treatment goals met and discharge from hospital  Patient/family agrees with progress made and goals achieved: Yes   Skilled Intervention: Pt received seated in bed, agreeable to PT session. No complaints of pain. Supine to sit with Supervision with use of bed features. Slide board transfer bed to w/c with max A. Pt has received w/c she will be d/c home in, adjusted height of w/c to hemi-height for improved pt fit. Slide board transfer w/c to mat table then mat table to new w/c with max A. Pt does not  fit into new w/c as it is 18x20 vs 20x18 she has been using on unit. Also attempted to adjust B elevating leg rests for improved pt fit but they do not extend long enough to accommodate RLE in hard cast. Notified CSW so that correct size equipment can be brought for pt prior to d/c home tomorrow. Slide board transfer back to bed max A. Sit to supine mod A for BLE management. Reviewed management of w/c parts with patient's daughter [name] with regards to arm rests, w/c cushion, folding up chair for transport in car, leg rests, and ELR management. She demonstrates good understanding of management of w/c parts. [name] RLE appears to be leaking fluid, RN notified and able to come assess drainage. Pt left semi-reclined in bed with needs in reach at end of session.   PT Discharge Precautions/Restrictions Precautions Precautions: Fall Restrictions Weight Bearing Restrictions: Yes RLE Weight Bearing: Non weight bearing Vision/Perception  Perception Perception: Within Functional Limits Praxis Praxis: Intact  Cognition Overall Cognitive Status: Within Functional Limits for tasks assessed Arousal/Alertness: Awake/alert Orientation Level: Oriented X4 Attention: Focused Focused Attention: Appears intact Memory: Impaired Memory Impairment: Decreased short term memory;Decreased recall of new information Awareness: Appears intact Problem Solving: Appears intact Safety/Judgment: Impaired Comments: Able to recall R NWBing pre-cautions, however, unable to maintain in functional context Sensation Sensation Light Touch: Appears Intact Proprioception: Appears Intact Coordination Gross Motor Movements are Fluid and Coordinated: No Fine Motor Movements are Fluid and Coordinated: Yes Coordination and Movement Description: Impaired 2/2 R LE NWBing and generalized weakness Motor  Motor Motor: Other (comment) Motor - Skilled Clinical Observations:  Generalized weakness and impaired 2/2 NWBing  pre-cautions Motor - Discharge Observations: generalized weakness and impaired 2/2 WBing precautions, improved since eval  Mobility Bed Mobility Bed Mobility: Rolling Right;Rolling Left;Supine to Sit;Sit to Supine Rolling Right: Supervision/verbal cueing Rolling Left: Supervision/Verbal cueing Supine to Sit: Supervision/Verbal cueing Sit to Supine: Moderate Assistance - Patient 50-74% Transfers Transfers: Sit to Stand;Lateral/Scoot Transfers Sit to Stand: Moderate Assistance - Patient 50-74%;2 Helpers Lateral/Scoot Transfers: Dependent - Engineer, drilling (Assistive device): Other (Comment)(slide board) Transfer via Lift Equipment: Maximove(manual hoyer) Locomotion  Gait Ambulation: No Gait Gait: No Stairs / Additional Locomotion Stairs: No Architect: Yes Wheelchair Assistance: Chartered loss adjuster: Both upper extremities Wheelchair Parts Management: Needs assistance Distance: 70  Trunk/Postural Assessment  Cervical Assessment Cervical Assessment: Exceptions to WFL(forward head) Thoracic Assessment Thoracic Assessment: Exceptions to WFL(kyphotic; rounded shoulders) Lumbar Assessment Lumbar Assessment: Exceptions to WFL(posterior pelvic tilt) Postural Control Postural Control: Deficits on evaluation(delayed/insufficient)  Balance Balance Balance Assessed: Yes Static Sitting Balance Static Sitting - Balance Support: Feet supported;No upper extremity supported Static Sitting - Level of Assistance: 7: Independent Dynamic Sitting Balance Dynamic Sitting - Balance Support: Feet supported;During functional activity Dynamic Sitting - Level of Assistance: 5: Stand by assistance Static Standing Balance Static Standing - Balance Support: Bilateral upper extremity supported Static Standing - Level of Assistance: 3: Mod assist Extremity Assessment   RLE Assessment RLE Assessment: Exceptions to [facility] Active  Range of Motion (AROM) Comments: Fixed ankle, knee flexion/extension WFL, hip flexion limited by body habitus General Strength Comments: Grossly in sitting: 4-/5 hip flexion, at least 3/5 knee flexion/extension, can wiggle toes LLE Assessment LLE Assessment: Within Functional Limits Active Range of Motion (AROM) Comments: WFL for functional mobility with hip flexion limited by body habitus General Strength Comments: Grossly in sitting: 5/5 throughout except hip flexion 4-/5     Excell Seltzer, PT, DPT 08/04/2019, 4:50 PM

## 2019-08-04 NOTE — Progress Notes (Signed)
Occupational Therapy Session Note  Patient Details  Name: Katherine Tyler MRN: [id number] Date of Birth: [birth year]  Today's Date: 08/04/2019 OT Individual Time: 1136-1200 OT Individual Time Calculation (min): 24 min    Short Term Goals: Week 1:  OT Short Term Goal 1 (Week 1): Pt will consistently transfer with +1 assist using LRAD while maintaining WBing pre-cautions OT Short Term Goal 1 - Progress (Week 1): Not met OT Short Term Goal 2 (Week 1): Pt will don pants with mod A using AE PRN OT Short Term Goal 2 - Progress (Week 1): Not met OT Short Term Goal 3 (Week 1): Pt will consistently complete sit>stand with no more than mod A using LRAD while maintaining WBing pre-cautions during functional task. OT Short Term Goal 3 - Progress (Week 1): Not met Week 2:  OT Short Term Goal 1 (Week 2): STG=LTG due to LOS OT Short Term Goal 1 - Progress (Week 2): Progressing toward goal Week 3:  OT Short Term Goal 1 (Week 3): D/C date extended, cont working towards LTG  Skilled Therapeutic Interventions/Progress Updates:    Pt received in w/c with daughter in the room.  The daughter stated she felt competent with her transfers and did not need to focus on that this session. Pt is going home tomorrow with family and home health.  Practiced a HEP for pt to use to enhance her shoulder ROM and strength and her upper back for posture.   Provided a written hand out of the exercises.  3 were table top AROM and 2 were with orange theraband.  Pt practiced all 5 exercises for 20 min and daughter observed.   Pt in w.c with all needs met.     Therapy Documentation Precautions:  Precautions Precautions: Fall Restrictions Weight Bearing Restrictions: Yes RLE Weight Bearing: Non weight bearing   Pain: Pain Assessment Pain Score: 0-No pain      Therapy/Group: Individual Therapy  [place] 08/04/2019, 12:17 PM

## 2019-08-04 NOTE — Progress Notes (Signed)
[facility] PHYSICAL MEDICINE & REHABILITATION PROGRESS NOTE   Subjective/Complaints:  Pt reports feeling good this AM again- slept great all night- got new cast put on RLE.  LBM this AM.   ROS: Patient denies CP, SOB, cough, HA Objective:   No results found. Recent Labs    08/03/19 0628  WBC 3.3*  HGB 8.3*  HCT 27.2*  PLT 194   Recent Labs    08/03/19 0628  NA 139  K 3.4*  CL 107  CO2 24  GLUCOSE 96  BUN 11  CREATININE 1.11*  CALCIUM 8.3*    Intake/Output Summary (Last 24 hours) at 08/04/2019 0914 Last data filed at 08/04/2019 0835 Gross per 24 hour  Intake 472 ml  Output -  Net 472 ml     Physical Exam: Vital Signs Blood pressure (!) 156/47, pulse 62, temperature 98 F (36.7 C), resp. rate 18, height 5\' 3"  (1.6 m), weight 110.5 kg, SpO2 99 %. Constitutional: No distress . Vital signs and labs reviewed Sitting up in bed, bandage off forehead gone, bright affect, appropriate, awake, alert, NAD HEENT: EOMI, oral membranes moist-bandage off head Cardiovascular: RRR without murmur.     Respiratory: CTA Bilaterally without wheezes or rales. GI: normoactive-hyperactive BS, NT, ND, protuberant - much less Musculoskeletal:    R>L LE edema; neurovascularly intact distal to cast on RLE; new true cast in place- initially looked like uniboot, but now real cast- toes cool, but good pulses RLE Neurological: Alert HOH Motor: Bilateral upper extremities: 4-/5 proximal distal, stable Right lower extremity: Hip flexion, knee extension 4/5, wiggles toes without limitation  Left lower extremity: 4-4+/5 proximal to distal  Skin: Right foot with dressing remains CDI; hands show bruising B/L- bluish Psychiatric: Normal mood. Normal behavior. Bright affect    Assessment/Plan: 1. Functional deficits secondary to right ankle fx which require 3+ hours per day of interdisciplinary therapy in a comprehensive inpatient rehab setting.  Physiatrist is providing close team  supervision and 24 hour management of active medical problems listed below.  Physiatrist and rehab team continue to assess barriers to discharge/monitor patient progress toward functional and medical goals  Care Tool:  Bathing    Body parts bathed by patient: Right arm, Left arm, Chest, Abdomen, Face, Right upper leg, Left upper leg   Body parts bathed by helper: Front perineal area, Buttocks, Left lower leg Body parts n/a: Right lower leg   Bathing assist Assist Level: Minimal Assistance - Patient > 75%     Upper Body Dressing/Undressing Upper body dressing   What is the patient wearing?: Dress    Upper body assist Assist Level: Supervision/Verbal cueing    Lower Body Dressing/Undressing Lower body dressing      What is the patient wearing?: Incontinence brief     Lower body assist Assist for lower body dressing: Total Assistance - Patient < 25%     Toileting Toileting Toileting Activity did not occur Press photographer(Clothing management and hygiene only): Safety/medical concerns  Toileting assist Assist for toileting: Total Assistance - Patient < 25%(bed level)     Transfers Chair/bed transfer  Transfers assist  Chair/bed transfer activity did not occur: Safety/medical concerns(nausea/vomitting)  Chair/bed transfer assist level: Maximal Assistance - Patient 25 - 49%     Locomotion Ambulation   Ambulation assist   Ambulation activity did not occur: Safety/medical concerns(decreased strength/endurance, R LE NWB)          Walk 10 feet activity   Assist  Walk 10 feet activity did not occur:  Safety/medical concerns(decreased strength/endurance, R LE NWB)        Walk 50 feet activity   Assist Walk 50 feet with 2 turns activity did not occur: Safety/medical concerns(decreased strength/endurance, R LE NWB)         Walk 150 feet activity   Assist Walk 150 feet activity did not occur: Safety/medical concerns(decreased strength/endurance, R LE NWB)          Walk 10 feet on uneven surface  activity   Assist Walk 10 feet on uneven surfaces activity did not occur: Safety/medical concerns(decreased strength/endurance, R LE NWB)         Wheelchair     Assist Will patient use wheelchair at discharge?: Yes Type of Wheelchair: Manual    Wheelchair assist level: Supervision/Verbal cueing Max wheelchair distance: 22'    Wheelchair 50 feet with 2 turns activity    Assist    Wheelchair 50 feet with 2 turns activity did not occur: Safety/medical concerns(decreased UE strength/endurance)   Assist Level: Supervision/Verbal cueing   Wheelchair 150 feet activity     Assist Wheelchair 150 feet activity did not occur: Safety/medical concerns(decreased UE strength/endurance, R LE NWB)        Medical Problem List and Plan: 1.  Deficits with mobility, transfers, self-care secondary to right ankle fracture.  Cont CIR PT, OT  8/19- hasn't been able to participate as well due to ileus/hooked up to wall suction per therapy.  8/21- first day in gym in 1+ weeks 2.  Antithrombotics: -DVT/anticoagulation:  Pharmaceutical: Other (comment) Eliquis             -antiplatelet therapy: N/A 3. Pain Management: Continue oxycodone as needed, well controlled   -  voltaren gel for bilateral knee pain 4. Mood: Team to provide ego support for chronic anxiety.  LCSW to follow for evaluation and support             -antipsychotic agents: N/A 5. Neuropsych: This patient is not fully capable of making decisions on her own behalf. 6. Skin/Wound Care: Monitor incision for healing daily.  Routine pressure relief measures 7. Fluids/Electrolytes/Nutrition: encourage PO  Continue protein supp for low albumin 8.  Acute on chronic renal failure: IVF stopped              -encourage PO fluids  8/12- BUN improved, IVF d/ced  8/18- on IVFs again, will allow pt PO water/ice chips  8/19- labs in AM  8/20- K+ 3,0 but BMP better otherwise  8/21- K+ up to 3.9  with repletion- will recheck Monday  8/24- K+ 3.4- will replete and add daily K+=  8/25- will recheck BMP in AM  9. Iron deficiency anemia with ABLA: Continue iron supplement             Hemoglobin 8.3 on 8/12, stable at same 8/14  8/21- Hb 7.8 yesterday- due to dilution effect? 10. HTN: Monitor blood pressures and watch for signs of orthostasis.  Continue losartan, diltiazem, metoprolol, and furosemide daily.  Reasonable control at present  8/19- BP more elevated than nml- could be abd pain vs IVFs- will monitor and change meds if not improved in next 24-48 hrs.  8/20- will increase diltiazem to 90 mg QID due to lower pulse, but higher BP  8/21- 158/48- changed Metoprolol and Diltiazem back to PO/longer acting forms.  8/24- BP variable/spiked x1 Vitals:   08/03/19 2010 08/04/19 0448  BP: (!) 154/50 (!) 156/47  Pulse: 67 62  Resp: 17 18  Temp: 98.2 F (  36.8 C) 98 F (36.7 C)  SpO2: 98% 99%  Controlled 8/16 11. Chronic diastolic CHF: Discontinue IV fluids. Continue losartan, metoprolol, furosemide.             Daily weights Filed Weights   08/01/19 0515 08/02/19 0410 08/03/19 0411  Weight: 110.2 kg 111 kg 110.5 kg    Stable on 8/14  8/24- weight stable 12.  PAF: Monitor heart rate TID.  Continue diltiazem and metoprolol.             HR controlled at present on 8/13 13.  Anxiety disorder: Continue Celexa daily 14. Constipation/focal ileus/distal SBO:   -daily senna-s and miralax  --last bm 8/15  8/17- another partial SBO and ileus again on KUB- NGT placed again, IVFs 75 cc/hr and NPO otherwise- increase Senokot S to 2 tabs BID as well to prevent from occurring again. Will get KUB tomorrow to f/u.  8/18- NGT still in place/on suction; will allow water/ice chips, but check another KUB in AM to f/u.-   8/19- NGT to suction- still draining- on IVFs 75/hr- will start liquid diet and hold wall suction when taking PO x 30 minutes-   8/20- d/c NGT and put back on regular diet as of  lunch  8/21- LBM today in early afternoon- liquid- incontinent;   8/24- refusing colace- LBM this AM- regular, per pt. 7. Dispo- - d/c 8/21  8/17- will talk to SW about keeping longer due to SBO  -therapy asked if pt could go home with foley since cannot do transfers easily at all.  -pt needs to keep staples in until f/u with surgeon and maintain NWB, per PA, Pam Love note.  8/19- team conference today- will d/c 8/26 since slow progress form medical issues, however is still max assist of 2 for transfers- will need Hoyer lift at home, unfortunately since cannot maintain NWB on LE.  8/20- will d/w team new d/c date since slow progress/ileus/medical issues  LOS: 20 days A FACE TO FACE EVALUATION WAS PERFORMED  Jerome Viglione 08/04/2019, 9:14 AM

## 2019-08-04 NOTE — Progress Notes (Signed)
Occupational Therapy Session Note  Patient Details  Name: Katherine Tyler MRN: [id number] Date of Birth: [birth year]  Today's Date: 08/04/2019 OT Individual Time: 9767-3419 OT Individual Time Calculation (min): 70 min    Short Term Goals: Week 3:  OT Short Term Goal 1 (Week 3): D/C date extended, cont working towards LTG  Skilled Therapeutic Interventions/Progress Updates:    Pt seen for OT session focusing on functional mobility and UE/LE strengthening. Pt in supine upon arrival, agreeable to tx session and denying pain. She transferred to sitting EOB with supervision using hospital bed functions with increased time.  Pants threaded total A and pt stood from EOB with +2 assist from hightly elevated EOB inorder for pants to be pulled up total A. +2 mod A sliding board transfer to w/c with +2 required to place equipment and stabilze. Pillows proper in chair to facilitate upright posture in chair.  Transfer to EOM completed in same manner as described above. Completed zoom ball activity seated EOM for UE strengthening/ROM and general activity tolerance. Min tactile cuing for full ROM. Transitioned to supine, completed x3 sets of 10 of the following exercises with multi-modal cuing for proper form and technique:   Glute bridge with L LE R hip flexion with min A  #3 dowel rod overhead press, chest press and bicep curl.  Pt returned to w/c at end of session, returned to room and left seated in w/c with all needs in reach.   Therapy Documentation Precautions:  Precautions Precautions: Fall Restrictions Weight Bearing Restrictions: Yes RLE Weight Bearing: Non weight bearing   Therapy/Group: Individual Therapy  Rosamae Rocque L 08/04/2019, 6:59 AM

## 2019-08-04 NOTE — Progress Notes (Signed)
Occupational Therapy Discharge Summary  Patient Details  Name: Katherine Tyler MRN: [id number] Date of Birth: June 18, 1929   Patient has met 7 of 9 long term goals due to improved activity tolerance, improved balance, postural control, ability to compensate for deficits and improved coordination.  Patient to discharge at [facility] Max Assist level.  Patient's care partner is independent to provide the necessary physical assistance at discharge.  [name] daughter has been present throughout pt's rehab admission. She has observed as well as completed hands on training. Pt with multiple children who have completed 1 session of hands on family ed. They plan to have +2 assist available at all times at d/c. Recommending LB dressing and pericare/buttock hygiene be completed at bed level. Recommending +2 mod A for sliding board transfers and [facility] lift provided in order to reduce caregiver burden and increase safety with mobility. Pt with difficulty maintaining R NWB and therefore recommending no standing at d/c. UB bathing/dressing being completed EOB with set-up/supervision.  Pt and family are aware and agreeable with d/c recommendations.   Reasons goals not met: Pt requires +2 assist for sit>stand. She is ale to verbally recall [place] pre-cautions, however, is not able to maintain during functional mobility.   Recommendation:  Patient will benefit from ongoing skilled OT services in home health setting to continue to advance functional skills in the area of BADL and Reduce care partner burden.  Equipment: Heavy duty drop arm BSC, sliding board, Hoyer lift, hospital bed, w/c  Reasons for discharge: treatment goals met and discharge from hospital  Patient/family agrees with progress made and goals achieved: Yes  OT Discharge Precautions/Restrictions  Precautions Precautions: Fall Restrictions Weight Bearing Restrictions: Yes RLE Weight Bearing: Non weight bearing Vision Baseline Vision/History: Wears  glasses Wears Glasses: At all times Patient Visual Report: No change from baseline Vision Assessment?: No apparent visual deficits Perception  Perception: Within Functional Limits Praxis Praxis: Intact Cognition Overall Cognitive Status: Within Functional Limits for tasks assessed Arousal/Alertness: Awake/alert Orientation Level: Oriented X4 Memory: Impaired Memory Impairment: Decreased short term memory;Decreased recall of new information Awareness: Appears intact Problem Solving: Appears intact Safety/Judgment: Impaired Comments: Able to recall R NWBing pre-cautions, however, unable to maintain in functional context Sensation Sensation Light Touch: Appears Intact Proprioception: Appears Intact Coordination Gross Motor Movements are Fluid and Coordinated: No Fine Motor Movements are Fluid and Coordinated: Yes Coordination and Movement Description: Impaired 2/2 R LE NWBing and generalized weakness Motor  Motor Motor: Other (comment) Motor - Skilled Clinical Observations: Generalized weakness and impaired 2/2 NWBing pre-cautions Trunk/Postural Assessment  Cervical Assessment Cervical Assessment: Exceptions to WFL(Forward head) Thoracic Assessment Thoracic Assessment: Exceptions to WFL(Kyphotic; Rounded shoulders) Lumbar Assessment Lumbar Assessment: Exceptions to WFL(Posterior pelvic tilt) Postural Control Postural Control: Deficits on evaluation(Insufficent)  Balance Balance Balance Assessed: Yes Static Sitting Balance Static Sitting - Balance Support: Feet supported;No upper extremity supported Static Sitting - Level of Assistance: 7: Independent Dynamic Sitting Balance Dynamic Sitting - Balance Support: Feet supported;During functional activity Dynamic Sitting - Level of Assistance: 5: Stand by assistance Sitting balance - Comments: Sitting EOB to complete bathing/dressing routine Static Standing Balance Static Standing - Balance Support: Bilateral upper extremity  supported Static Standing - Level of Assistance: 3: Mod assist Extremity/Trunk Assessment RUE Assessment Active Range of Motion (AROM) Comments: decreased functional abduction and external rotation with brushing hair, able to reach side of her head, but not back of head when combing hair General Strength Comments: 4-/5 throughout RUE Body System: Ortho LUE Assessment Active Range of  Motion (AROM) Comments: Dacreased shoulder flexion and abduction to approximately 100 degrees 2/2 arthrtic changes General Strength Comments: 4-/5 throughout   Astin Sayre L 08/04/2019, 8:28 AM

## 2019-08-05 LAB — BASIC METABOLIC PANEL
Anion gap: 7 (ref 5–15)
BUN: 14 mg/dL (ref 8–23)
CO2: 26 mmol/L (ref 22–32)
Calcium: 8.6 mg/dL — ABNORMAL LOW (ref 8.9–10.3)
Chloride: 105 mmol/L (ref 98–111)
Creatinine, Ser: 1.11 mg/dL — ABNORMAL HIGH (ref 0.44–1.00)
GFR calc Af Amer: 51 mL/min — ABNORMAL LOW (ref >60)
GFR calc non Af Amer: 44 mL/min — ABNORMAL LOW (ref >60)
Glucose, Bld: 100 mg/dL — ABNORMAL HIGH (ref 70–99)
Potassium: 3.8 mmol/L (ref 3.5–5.1)
Sodium: 138 mmol/L (ref 135–145)

## 2019-08-05 MED ORDER — DILTIAZEM HCL ER COATED BEADS 180 MG PO CP24: 180.0000 mg | ORAL_CAPSULE | Freq: Two times a day (BID) | ORAL | 0 refills | Status: DC

## 2019-08-05 MED ORDER — DOCUSATE SODIUM 100 MG PO CAPS: 100.0000 mg | ORAL_CAPSULE | Freq: Two times a day (BID) | ORAL | 2 refills | Status: DC

## 2019-08-05 MED ORDER — DICLOFENAC SODIUM 1 % TD GEL: 2.0000 g | Freq: Three times a day (TID) | TRANSDERMAL | 0 refills | Status: DC

## 2019-08-05 MED ORDER — SENNOSIDES-DOCUSATE SODIUM 8.6-50 MG PO TABS: 2.0000 | ORAL_TABLET | Freq: Two times a day (BID) | ORAL | 0 refills | Status: AC

## 2019-08-05 MED ORDER — OXYCODONE HCL 5 MG PO TABS: 5.0000 mg | ORAL_TABLET | Freq: Two times a day (BID) | ORAL | 0 refills | Status: DC | PRN

## 2019-08-05 MED ORDER — POLYETHYLENE GLYCOL 3350 17 G PO PACK: 17.0000 g | PACK | Freq: Every day | ORAL | 0 refills | Status: AC

## 2019-08-05 MED ORDER — DOCUSATE SODIUM 50 MG/5ML PO LIQD: 100.0000 mg | Freq: Two times a day (BID) | ORAL | 0 refills | Status: DC

## 2019-08-05 MED ORDER — POTASSIUM CHLORIDE CRYS ER 20 MEQ PO TBCR: 20.0000 meq | EXTENDED_RELEASE_TABLET | Freq: Every day | ORAL | 0 refills | Status: DC

## 2019-08-05 NOTE — Progress Notes (Signed)
Social Work Discharge Note   The overall goal for the admission was met for:   Discharge location: Yes  Length of Stay: Yes  Discharge activity level: Yes  Home/community participation: Yes  Services provided included: MD, RD, PT, OT, RN, TR, Pharmacy and [place]: Medicare and Medicaid  Follow-up services arranged: Home Health: RN, PT, OT via [place], DME: 20x20 lightweight w/c with ELRs, cushion, hospital bed, heavy duty 3n1, hoyer lift and 30" transfer board via [employer] and Patient/Family has no preference for HH/DME agencies  Comments (or additional information):    Contact info:: daughter, Lea Baine @ [phone]       Daughter, Steffanie Dunn @ [phone]  Patient/Family verbalized understanding of follow-up arrangements: Yes  Individual responsible for coordination of the follow-up plan: pt/daughter  Confirmed correct DME delivered: Lennart Pall 08/05/2019    Marquan Vokes, Lorre Nick

## 2019-08-05 NOTE — Progress Notes (Signed)
Patient and daughter Katherine Tyler received discharge instructions from Algis Liming, PA-C with verbal understanding. Patient transported to home via EMS. Patient belongings transported to home via daughter Katherine Tyler.

## 2019-08-05 NOTE — Discharge Summary (Signed)
Physician Discharge Summary  Patient ID: Katherine Tyler MRN: [id number] DOB/AGE: May 06, 1929 85 y.o.  Admit date: 07/15/2019 Discharge date: 08/05/2019  Discharge Diagnoses:  Principal Problem:   Closed fracture of right fibula and tibia, sequela Active Problems:   Hypothyroidism   Essential hypertension   PAF (paroxysmal atrial fibrillation) (HCC)   Acute blood loss anemia   Discharged Condition:   Significant Diagnostic Studies: Dg Abd 1 View  Result Date: 07/29/2019 CLINICAL DATA:  Check gastric catheter EXAM: ABDOMEN - 1 VIEW COMPARISON:  07/28/2011 FINDINGS: Gastric catheter is coiled within the stomach. It is kinked upon itself but similar to that seen on the prior exam. Scattered large and small bowel gas is noted. IMPRESSION: Gastric catheter within the stomach although kinked upon itself similar to that noted on the prior study. Electronically Signed   By: Katherine Tyler M.D.   On: 07/29/2019 09:40   Dg Abd 1 View  Result Date: 07/28/2019 CLINICAL DATA:  Abdominal pain, distention EXAM: ABDOMEN - 1 VIEW COMPARISON:  07/27/2019 FINDINGS: NG tube remains coiled in the stomach. Decreasing small bowel distention. A few mildly prominent mid abdominal small bowel loops remain. No organomegaly or free air. Surgical clips in the right upper quadrant compatible with prior cholecystectomy. IMPRESSION: Improving bowel-gas pattern with decreasing small bowel distention. Electronically Signed   By: Katherine Tyler M.D.   On: 07/28/2019 08:15   Dg Abd 1 View  Result Date: 07/27/2019 CLINICAL DATA:  NG tube placement EXAM: ABDOMEN - 1 VIEW COMPARISON:  Earlier same day FINDINGS: Enteric tube tip and side port projected the expected location of the gastric fundus. Redemonstrated multiple moderately dilated loops of small bowel with index loop of small bowel with left mid hemiabdomen measuring approximately 3.9 cm in diameter, similar to previous examinations. There is mild gaseous distension of the  transverse colon with otherwise paucity of distal colonic gas. Post cholecystectomy. Limited visualization of lower thorax demonstrates atherosclerotic plaque within thoracic aorta. No definite acute osseous abnormalities. Degenerative change the lower lumbar spine is suspected though incompletely evaluated. IMPRESSION: 1. Enteric tube tip and side port project over the expected location of the gastric fundus. 2. Similar findings of ileus versus partial small bowel obstruction Electronically Signed   By: Katherine Tyler M.D.   On: 07/27/2019 07:18   Dg Abd 1 View  Result Date: 07/27/2019 CLINICAL DATA:  Nausea, vomiting, diarrhea. EXAM: ABDOMEN - 1 VIEW COMPARISON:  Radiographs 07/21/2019, 07/20/2019 FINDINGS: Dilated central small bowel loops measuring up to 3.6 cm, increased from prior exam. Mild gaseous gastric distension. Small amount of air and stool within nondilated colon. Upper abdomen not included in the field of view. Cholecystectomy clips in the right upper quadrant. IMPRESSION: Progressive small bowel dilatation since most recent exam last week, ileus versus small-bowel obstruction. Electronically Signed   By: Katherine Tyler M.D.   On: 07/27/2019 02:38   Dg Abd 1 View  Result Date: 07/21/2019 CLINICAL DATA:  Check NG tube EXAM: ABDOMEN - 1 VIEW COMPARISON:  07/20/2019 FINDINGS: Nasogastric catheter is noted in satisfactory position. Scattered large and small bowel air is noted. The overall appearance has improved slightly from the prior exam although a few mildly prominent loops of small bowel remain. No free air is noted. IMPRESSION: Overall improvement in the bowel gas pattern although a few mildly prominent small bowel loops remain. Electronically Signed   By: Katherine Tyler M.D.   On: 07/21/2019 13:42   Dg Abd 1 View  Result Date: 07/20/2019 CLINICAL  DATA:  NG tube placement. EXAM: ABDOMEN - 1 VIEW COMPARISON:  07/20/2019 at 9:46 a.m. FINDINGS: Nasogastric tube extends below the diaphragm,  tip in the mid to distal stomach. Mildly prominent loops of small bowel are unchanged from the earlier exam. IMPRESSION: Well-positioned nasogastric tube. Electronically Signed   By: Katherine Tyler M.D.   On: 07/20/2019 16:14   Dg Abd 1 View  Result Date: 07/20/2019 CLINICAL DATA:  Nausea, vomiting. EXAM: ABDOMEN - 1 VIEW COMPARISON:  None. FINDINGS: Mildly dilated small bowel loops are noted centrally which may represent focal ileus or possibly distal small bowel obstruction. No colonic dilatation is noted. Stool is noted in the rectum. No radio-opaque calculi or other significant radiographic abnormality are seen. IMPRESSION: Mildly dilated small bowel loops are noted centrally which may represent focal ileus or possibly distal small bowel obstruction. Electronically Signed   By: Katherine Tyler M.D.   On: 07/20/2019 10:04      Labs:  Basic Metabolic Panel: Recent Labs  Lab 07/30/19 0504 07/31/19 0528 08/03/19 0628 08/05/19 0508  NA 140 140 139 138  K 3.0* 3.9 3.4* 3.8  CL 109 111 107 105  CO2 22 22 24 26   GLUCOSE 86 89 96 100*  BUN 18 17 11 14   CREATININE 1.03* 1.11* 1.11* 1.11*  CALCIUM 8.1* 8.3* 8.3* 8.6*    CBC: CBC Latest Ref Rng & Units 08/03/2019 07/30/2019 07/27/2019  WBC 4.0 - 10.5 K/uL 3.3(L) 4.4 6.9  Hemoglobin 12.0 - 15.0 g/dL 8.3(L) 7.8(L) 8.5(L)  Hematocrit 36.0 - 46.0 % 27.2(L) 25.7(L) 27.4(L)  Platelets 150 - 400 K/uL 194 231 252    CBG: No results for input(s): GLUCAP in the last 168 hours.  Brief HPI:   Katherine Tyler is a 85 y.o. female with history of PAF, HTN, obesity. Chronic diastolic CHF who was admitted on 07/12/19 after fall with subsequent right open displaced trimalleolar fracture. She was taken to OR for ORIF right ankle Fx with by Dr. Doran Tyler. Post op splint placed with recommendations to be NWB. Elquis resumed and ABLA treated with one unit PRBC. Therapy initiated and patient noted to have difficulty maintaining NWB as well as pain affecting overall  functional ability. CIR recommended for follow up therapy.    Hospital Course: Katherine Tyler was admitted to rehab 07/15/2019 for inpatient therapies to consist of PT and OT at least three hours five days a week. Past admission physiatrist, therapy team and rehab RN have worked together to provide customized collaborative inpatient rehab. She continued to require oxycodone for pain control initially.  She reported issues with constipation at admission and was started on bowel program.  She continued abdominal discomfort with nausea an KUB was done revealing ileus.  Diet was downgraded to all liquids x24 hours without improvement therefore NG tube was placed 08/10 for decompression.  Her bowel program was been augmented and as symptoms improved with decrease in output,  NGT was removed 8/12 and diet was slowly advanced to regular.    On 08/16, she had recurrent issues with nausea and vomiting with KUB showing focal ileus with concerns of partial SBO.  NG tube was placed for decompression and she was downgraded to liquids.  Check of lytes showed hypokalemia which was supplemented and as drainage from NG tube resolved this was removed on 8/20.  Her diet was advanced back to regular and she is tolerating this without difficulty.  Pain control has greatly improved with limited use of narcotics.  Patient  and family have been educated on importance of adhering to strict bowel regimen to prevent constipation.  Blood pressures were monitored on TID basis and has been labile.  Cardizem has been titrated upwards for better control.  She did develop prerenal azotemia therefore was treated with IV fluids briefly.  Lasix was decreased to every other day with close monitoring of weights as well as other signs of fluid overload.  Her weights have been stable overall.  She has had issues with hypokalemia that has been supplemented intermittently.  Most recent check of lites showed recurrent hypokalemia therefore she was started  on K-Dur 20 mEq daily and is to have been met recheck by [facility] on 8/28 with results to PCP  Ortho was consulted for input on splint change and recommended leaving splinting for at least 3 weeks to help promote healing.  Splint was removed on 8/24 showing decrease in swelling and incisions to be clean dry and intact therefore sutures removed and cast was placed by Ortho.  Nursing reported seeing some drainage the next day therefore Dr. Doran Tyler was consulted for input.  He removed the cast and no signs of infection or fracture blisters noted.  Her incisions were intact without dehiscence and he replaced cast with short leg splint.  Patient to remain nonweightbearing and follow-up with ortho in office in 3 weeks.  She has made gains during her rehab stay and will continue to receive follow up [place] [place] by [place] after discharge.    Rehab course: During patient's stay in rehab weekly team conferences were held to monitor patient's progress, set goals and discuss barriers to discharge. At admission, patient required total assist with ADLs and mod to max assist + 2 for mobility.  She  has had improvement in activity tolerance, balance, postural control as well as ability to compensate for deficits. She requires +2 mod assist for ADL tasks.  She requires hoyer lift or max assist with SB for transfers and did not meet goals of min assist due to body habitus and NWB status on RLE.  She is able to propel her wheelchair with supervision. Family education was completed regarding hoyer transfers, elevation/management of BLE and need for +2 assist with care for safety  Disposition: Home  Diet: Low salt  Special Instructions: 1. NO weight on RLE. Keep splint dry 2. HHRN to draw BMET on 8/28 with results to PCP. 3. Continue to limit narcotics and adhere to bowel program   Discharge Instructions    Ambulatory referral to Physical Medicine Rehab   Complete by: As directed    1-2 weeks TC follow  up     Allergies as of 08/05/2019      Reactions   Terazosin Hcl Other (See Comments)   Muscle aches   Keflex [cephalexin] Other (See Comments)   unknown      Medication List    STOP taking these medications   traMADol 50 MG tablet Commonly known as: Ultram     TAKE these medications   acetaminophen 325 MG tablet Commonly known as: TYLENOL Take 1-2 tablets (325-650 mg total) by mouth every 4 (four) hours as needed for mild pain.   apixaban 5 MG Tabs tablet Commonly known as: ELIQUIS Take 1 tablet (5 mg total) by mouth 2 (two) times daily.   citalopram 20 MG tablet Commonly known as: CELEXA Take 1 tablet by mouth once daily   D3-1000 25 MCG (1000 UT) capsule Generic drug: Cholecalciferol Take 1,000  Units by mouth daily.   diclofenac sodium 1 % Gel Commonly known as: VOLTAREN Apply 2 g topically 3 (three) times daily.   diltiazem 180 MG 24 hr capsule Commonly known as: CARDIZEM CD Take 1 capsule (180 mg total) by mouth 2 (two) times daily. What changed:   medication strength  how much to take   docusate sodium 100 MG capsule Commonly known as: Colace Take 1 capsule (100 mg total) by mouth 2 (two) times daily.   Ferrous Sulfate Dried 45 MG Tbcr Commonly known as: Slow Release Iron Take 45 mg by mouth 2 (two) times daily.   furosemide 20 MG tablet Commonly known as: LASIX Take 1 tablet (20 mg total) by mouth every other day. Notes to patient: Next dose tomorrow   levothyroxine 125 MCG tablet Commonly known as: SYNTHROID Take 125 mcg by mouth daily before breakfast.   losartan 100 MG tablet Commonly known as: COZAAR Take 1 tablet (100 mg total) by mouth daily.   magnesium oxide 400 (241.3 Mg) MG tablet Commonly known as: MAG-OX Take 1 tablet (400 mg total) by mouth daily.   metoprolol succinate 25 MG 24 hr tablet Commonly known as: Toprol XL Take 1 tablet (25 mg total) by mouth daily.   nystatin powder Commonly known as: MYCOSTATIN/NYSTOP Apply  topically 4 (four) times daily.   oxyCODONE 5 MG immediate release tablet Commonly known as: Oxy IR/ROXICODONE Take 1 tablet (5 mg total) by mouth every 12 (twelve) hours as needed for severe pain.   polyethylene glycol 17 g packet Commonly known as: MIRALAX / GLYCOLAX Take 17 g by mouth daily.   potassium chloride SA 20 MEQ tablet Commonly known as: K-DUR Take 1 tablet (20 mEq total) by mouth daily.   senna-docusate 8.6-50 MG tablet Commonly known as: Senokot-S Take 2 tablets by mouth 2 (two) times daily. Adjust it up and down to make sure that you have a good BM daily.   thiamine 100 MG tablet Commonly known as: VITAMIN B-1 Take 100 mg by mouth daily.      Follow-up Information    Wylene Simmer, MD. Call on 08/06/2019.   Specialty: Orthopedic Surgery Why: for follow up appointment in 3 weeks Contact information: [address] [place] [address] [phone]        Sharion Balloon, FNP Follow up on 08/06/2019.   Specialty: Family Medicine Why: for post hospital follow up Contact information: [place] [address] [phone]        Satira Sark, MD Follow up.   Specialty: Cardiology Why: Keep your appointment  Contact information: [place] [address] [phone]        Courtney Heys, MD Follow up.   Specialty: Physical Medicine and Rehabilitation Why: Office will call you with follow up appointment Contact information: [address] [place] [address] [phone]           Signed: Bary Leriche 08/05/2019, 10:32 AM

## 2019-08-06 ENCOUNTER — Telehealth: Payer: Self-pay | Admitting: Cardiology

## 2019-08-06 DIAGNOSIS — M1990 Unspecified osteoarthritis, unspecified site: Secondary | ICD-10-CM | POA: Diagnosis not present

## 2019-08-06 DIAGNOSIS — I5032 Chronic diastolic (congestive) heart failure: Secondary | ICD-10-CM | POA: Diagnosis not present

## 2019-08-06 DIAGNOSIS — N189 Chronic kidney disease, unspecified: Secondary | ICD-10-CM | POA: Diagnosis not present

## 2019-08-06 DIAGNOSIS — E669 Obesity, unspecified: Secondary | ICD-10-CM | POA: Diagnosis not present

## 2019-08-06 DIAGNOSIS — E039 Hypothyroidism, unspecified: Secondary | ICD-10-CM | POA: Diagnosis not present

## 2019-08-06 DIAGNOSIS — Z7901 Long term (current) use of anticoagulants: Secondary | ICD-10-CM | POA: Diagnosis not present

## 2019-08-06 DIAGNOSIS — K449 Diaphragmatic hernia without obstruction or gangrene: Secondary | ICD-10-CM | POA: Diagnosis not present

## 2019-08-06 DIAGNOSIS — E559 Vitamin D deficiency, unspecified: Secondary | ICD-10-CM | POA: Diagnosis not present

## 2019-08-06 DIAGNOSIS — E875 Hyperkalemia: Secondary | ICD-10-CM | POA: Diagnosis not present

## 2019-08-06 DIAGNOSIS — D509 Iron deficiency anemia, unspecified: Secondary | ICD-10-CM | POA: Diagnosis not present

## 2019-08-06 DIAGNOSIS — S82851D Displaced trimalleolar fracture of right lower leg, subsequent encounter for closed fracture with routine healing: Secondary | ICD-10-CM | POA: Diagnosis not present

## 2019-08-06 DIAGNOSIS — K567 Ileus, unspecified: Secondary | ICD-10-CM | POA: Diagnosis not present

## 2019-08-06 DIAGNOSIS — D62 Acute posthemorrhagic anemia: Secondary | ICD-10-CM | POA: Diagnosis not present

## 2019-08-06 DIAGNOSIS — F419 Anxiety disorder, unspecified: Secondary | ICD-10-CM | POA: Diagnosis not present

## 2019-08-06 DIAGNOSIS — K56609 Unspecified intestinal obstruction, unspecified as to partial versus complete obstruction: Secondary | ICD-10-CM | POA: Diagnosis not present

## 2019-08-06 DIAGNOSIS — N179 Acute kidney failure, unspecified: Secondary | ICD-10-CM | POA: Diagnosis not present

## 2019-08-06 DIAGNOSIS — Z79891 Long term (current) use of opiate analgesic: Secondary | ICD-10-CM | POA: Diagnosis not present

## 2019-08-06 DIAGNOSIS — I480 Paroxysmal atrial fibrillation: Secondary | ICD-10-CM | POA: Diagnosis not present

## 2019-08-06 DIAGNOSIS — K219 Gastro-esophageal reflux disease without esophagitis: Secondary | ICD-10-CM | POA: Diagnosis not present

## 2019-08-06 DIAGNOSIS — I130 Hypertensive heart and chronic kidney disease with heart failure and stage 1 through stage 4 chronic kidney disease, or unspecified chronic kidney disease: Secondary | ICD-10-CM | POA: Diagnosis not present

## 2019-08-06 DIAGNOSIS — E785 Hyperlipidemia, unspecified: Secondary | ICD-10-CM | POA: Diagnosis not present

## 2019-08-06 NOTE — Telephone Encounter (Signed)
Virtual Visit Pre-Appointment Phone Call  "(Name), I am calling you today to discuss your upcoming appointment. We are currently trying to limit exposure to the virus that causes COVID-19 by seeing patients at home rather than in the office."  1. "What is the BEST phone number to call the day of the visit?" - include this in appointment notes  2. Do you have or have access to (through a family member/friend) a smartphone with video capability that we can use for your visit?" a. If yes - list this number in appt notes as cell (if different from BEST phone #) and list the appointment type as a VIDEO visit in appointment notes b. If no - list the appointment type as a PHONE visit in appointment notes  3. Confirm consent - "In the setting of the current Covid19 crisis, you are scheduled for a (phone or video) visit with your provider on (date) at (time).  Just as we do with many in-office visits, in order for you to participate in this visit, we must obtain consent.  If you'd like, I can send this to your mychart (if signed up) or email for you to review.  Otherwise, I can obtain your verbal consent now.  All virtual visits are billed to your insurance company just like a normal visit would be.  By agreeing to a virtual visit, we'd like you to understand that the technology does not allow for your provider to perform an examination, and thus may limit your provider's ability to fully assess your condition. If your provider identifies any concerns that need to be evaluated in person, we will make arrangements to do so.  Finally, though the technology is pretty good, we cannot assure that it will always work on either your or our end, and in the setting of a video visit, we may have to convert it to a phone-only visit.  In either situation, we cannot ensure that we have a secure connection.  Are you willing to proceed?" STAFF: Did the patient verbally acknowledge consent to telehealth visit? Document  YES/NO here: Yes  4. Advise patient to be prepared - "Two hours prior to your appointment, go ahead and check your blood pressure, pulse, oxygen saturation, and your weight (if you have the equipment to check those) and write them all down. When your visit starts, your provider will ask you for this information. If you have an Apple Watch or Kardia device, please plan to have heart rate information ready on the day of your appointment. Please have a pen and paper handy nearby the day of the visit as well."  5. Give patient instructions for MyChart download to smartphone OR [email or web address] as below if video visit (depending on what platform provider is using)  6. Inform patient they will receive a phone call 15 minutes prior to their appointment time (may be from unknown caller ID) so they should be prepared to answer    TELEPHONE CALL NOTE  Katherine Tyler has been deemed a candidate for a follow-up tele-health visit to limit community exposure during the Covid-19 pandemic. I spoke with the patient via phone to ensure availability of phone/video source, confirm preferred email & phone number, and discuss instructions and expectations.  I reminded Katherine Tyler to be prepared with any vital sign and/or heart rhythm information that could potentially be obtained via home monitoring, at the time of her visit. I reminded Katherine Tyler to expect a phone call prior to  her visit.  Katherine Tyler 08/06/2019 9:26 AM

## 2019-08-07 ENCOUNTER — Telehealth: Payer: Self-pay

## 2019-08-07 DIAGNOSIS — I130 Hypertensive heart and chronic kidney disease with heart failure and stage 1 through stage 4 chronic kidney disease, or unspecified chronic kidney disease: Secondary | ICD-10-CM | POA: Diagnosis not present

## 2019-08-07 DIAGNOSIS — N189 Chronic kidney disease, unspecified: Secondary | ICD-10-CM | POA: Diagnosis not present

## 2019-08-07 DIAGNOSIS — K56609 Unspecified intestinal obstruction, unspecified as to partial versus complete obstruction: Secondary | ICD-10-CM | POA: Diagnosis not present

## 2019-08-07 DIAGNOSIS — S82851D Displaced trimalleolar fracture of right lower leg, subsequent encounter for closed fracture with routine healing: Secondary | ICD-10-CM | POA: Diagnosis not present

## 2019-08-07 DIAGNOSIS — K567 Ileus, unspecified: Secondary | ICD-10-CM | POA: Diagnosis not present

## 2019-08-07 DIAGNOSIS — I5032 Chronic diastolic (congestive) heart failure: Secondary | ICD-10-CM | POA: Diagnosis not present

## 2019-08-07 NOTE — Telephone Encounter (Signed)
Transitional Care call-daughter [name]     1. Are you/is patient experiencing any problems since coming home? No Are there any questions regarding any aspect of care? No 2. Are there any questions regarding medications administration/dosing? No Are meds being taken as prescribed? Yes Patient should review meds with caller to confirm 3. Have there been any falls? No 4. Has Home Health been to the house and/or have they contacted you? Yes If not, have you tried to contact them? Can we help you contact them? 5. Are bowels and bladder emptying properly? Yes Are there any unexpected incontinence issues? No If applicable, is patient following bowel/bladder programs? 6. Any fevers, problems with breathing, unexpected pain? No 7. Are there any skin problems or new areas of breakdown? Yes 8. Has the patient/family member arranged specialty MD follow up (ie cardiology/neurology/renal/surgical/etc)? Yes Can we help arrange? 9. Does the patient need any other services or support that we can help arrange? No 10. Are caregivers following through as expected in assisting the patient? Yes 11. Has the patient quit smoking, drinking alcohol, or using drugs as recommended? Yes  Appointment time 11:40 am arrive 11:20 am with Dr. Dagoberto Ligas on 08/19/2019 [place] [address]

## 2019-08-10 ENCOUNTER — Telehealth: Payer: Self-pay | Admitting: Family

## 2019-08-10 ENCOUNTER — Ambulatory Visit: Payer: Medicare Other | Admitting: Family

## 2019-08-10 DIAGNOSIS — S82851D Displaced trimalleolar fracture of right lower leg, subsequent encounter for closed fracture with routine healing: Secondary | ICD-10-CM | POA: Diagnosis not present

## 2019-08-10 DIAGNOSIS — N189 Chronic kidney disease, unspecified: Secondary | ICD-10-CM | POA: Diagnosis not present

## 2019-08-10 DIAGNOSIS — K56609 Unspecified intestinal obstruction, unspecified as to partial versus complete obstruction: Secondary | ICD-10-CM | POA: Diagnosis not present

## 2019-08-10 DIAGNOSIS — I130 Hypertensive heart and chronic kidney disease with heart failure and stage 1 through stage 4 chronic kidney disease, or unspecified chronic kidney disease: Secondary | ICD-10-CM | POA: Diagnosis not present

## 2019-08-10 DIAGNOSIS — K567 Ileus, unspecified: Secondary | ICD-10-CM | POA: Diagnosis not present

## 2019-08-10 DIAGNOSIS — I5032 Chronic diastolic (congestive) heart failure: Secondary | ICD-10-CM | POA: Diagnosis not present

## 2019-08-10 NOTE — Telephone Encounter (Signed)
Patient has a follow up appointment scheduled. 

## 2019-08-11 DIAGNOSIS — S82851D Displaced trimalleolar fracture of right lower leg, subsequent encounter for closed fracture with routine healing: Secondary | ICD-10-CM | POA: Diagnosis not present

## 2019-08-11 DIAGNOSIS — I5032 Chronic diastolic (congestive) heart failure: Secondary | ICD-10-CM | POA: Diagnosis not present

## 2019-08-11 DIAGNOSIS — N189 Chronic kidney disease, unspecified: Secondary | ICD-10-CM | POA: Diagnosis not present

## 2019-08-11 DIAGNOSIS — K56609 Unspecified intestinal obstruction, unspecified as to partial versus complete obstruction: Secondary | ICD-10-CM | POA: Diagnosis not present

## 2019-08-11 DIAGNOSIS — I130 Hypertensive heart and chronic kidney disease with heart failure and stage 1 through stage 4 chronic kidney disease, or unspecified chronic kidney disease: Secondary | ICD-10-CM | POA: Diagnosis not present

## 2019-08-11 DIAGNOSIS — K567 Ileus, unspecified: Secondary | ICD-10-CM | POA: Diagnosis not present

## 2019-08-12 DIAGNOSIS — S82851D Displaced trimalleolar fracture of right lower leg, subsequent encounter for closed fracture with routine healing: Secondary | ICD-10-CM | POA: Diagnosis not present

## 2019-08-12 DIAGNOSIS — N189 Chronic kidney disease, unspecified: Secondary | ICD-10-CM | POA: Diagnosis not present

## 2019-08-12 DIAGNOSIS — K567 Ileus, unspecified: Secondary | ICD-10-CM | POA: Diagnosis not present

## 2019-08-12 DIAGNOSIS — I5032 Chronic diastolic (congestive) heart failure: Secondary | ICD-10-CM | POA: Diagnosis not present

## 2019-08-12 DIAGNOSIS — K56609 Unspecified intestinal obstruction, unspecified as to partial versus complete obstruction: Secondary | ICD-10-CM | POA: Diagnosis not present

## 2019-08-12 DIAGNOSIS — I130 Hypertensive heart and chronic kidney disease with heart failure and stage 1 through stage 4 chronic kidney disease, or unspecified chronic kidney disease: Secondary | ICD-10-CM | POA: Diagnosis not present

## 2019-08-14 ENCOUNTER — Ambulatory Visit (INDEPENDENT_AMBULATORY_CARE_PROVIDER_SITE_OTHER): Payer: Medicare Other | Admitting: Family

## 2019-08-14 ENCOUNTER — Encounter: Payer: Self-pay | Admitting: Family

## 2019-08-14 DIAGNOSIS — I130 Hypertensive heart and chronic kidney disease with heart failure and stage 1 through stage 4 chronic kidney disease, or unspecified chronic kidney disease: Secondary | ICD-10-CM | POA: Diagnosis not present

## 2019-08-14 DIAGNOSIS — E669 Obesity, unspecified: Secondary | ICD-10-CM | POA: Diagnosis not present

## 2019-08-14 DIAGNOSIS — I4891 Unspecified atrial fibrillation: Secondary | ICD-10-CM

## 2019-08-14 DIAGNOSIS — S82251B Displaced comminuted fracture of shaft of right tibia, initial encounter for open fracture type I or II: Secondary | ICD-10-CM

## 2019-08-14 DIAGNOSIS — N189 Chronic kidney disease, unspecified: Secondary | ICD-10-CM | POA: Diagnosis not present

## 2019-08-14 DIAGNOSIS — K567 Ileus, unspecified: Secondary | ICD-10-CM | POA: Diagnosis not present

## 2019-08-14 DIAGNOSIS — Z09 Encounter for follow-up examination after completed treatment for conditions other than malignant neoplasm: Secondary | ICD-10-CM

## 2019-08-14 DIAGNOSIS — K56609 Unspecified intestinal obstruction, unspecified as to partial versus complete obstruction: Secondary | ICD-10-CM | POA: Diagnosis not present

## 2019-08-14 DIAGNOSIS — I10 Essential (primary) hypertension: Secondary | ICD-10-CM

## 2019-08-14 DIAGNOSIS — I5032 Chronic diastolic (congestive) heart failure: Secondary | ICD-10-CM | POA: Diagnosis not present

## 2019-08-14 DIAGNOSIS — S82851D Displaced trimalleolar fracture of right lower leg, subsequent encounter for closed fracture with routine healing: Secondary | ICD-10-CM | POA: Diagnosis not present

## 2019-08-14 NOTE — Progress Notes (Signed)
   Virtual Visit via telephone Note Due to COVID-19 pandemic this visit was conducted virtually. This visit type was conducted due to national recommendations for restrictions regarding the COVID-19 Pandemic (e.g. social distancing, sheltering in place) in an effort to limit this patient's exposure and mitigate transmission in our community. All issues noted in this document were discussed and addressed.  A physical exam was not performed with this format.  I connected with Katherine Tyler on 08/14/19 at 2:07 pm by telephone and verified that I am speaking with the correct person using two identifiers. Katherine Tyler is currently located at home and daughter is currently with her during visit. The provider, Evelina Dun, FNP is located in their office at time of visit.  I discussed the limitations, risks, security and privacy concerns of performing an evaluation and management service by telephone and the availability of in person appointments. I also discussed with the patient that there may be a patient responsible charge related to this service. The patient expressed understanding and agreed to proceed.   History and Present Illness:  HPI Pt calls the office today for hospital follow up. She reports she fell on 07/12/19 and had an open fracture of her tibial. She had surgery to repair ankle. After surgery she devolved an ileus.  She was discharged on 08/05/19 and continues to work PT twice a week at her home.   She has follow up with Ortho 08/26/19. She has a splint in place. She is still on nonweightbearing until her appt with Ortho. She has a nurse that is coming once a week.   She continues to take her Elquis for A Fib. Denies any SOB, chest pain, or peripheral edema  She reports her pain is a 0 out 10 now because she took "two tylenols".   ROS   Observations/Objective: No SOB or distress noted   Assessment and Plan: 1. Hospital discharge follow-up  2. Atrial fibrillation, unspecified  type ([place])  3. Essential hypertension  4. Type I or II open displaced comminuted fracture of shaft of right tibia, initial encounter  5. Obesity (BMI 30-39.9)  Hospital notes reviewed Keep Ortho appts  Continue with PT Report any s/s of infection  Pain well controlled Keep chronic follow up appt     I discussed the assessment and treatment plan with the patient. The patient was provided an opportunity to ask questions and all were answered. The patient agreed with the plan and demonstrated an understanding of the instructions.   The patient was advised to call back or seek an in-person evaluation if the symptoms worsen or if the condition fails to improve as anticipated.  The above assessment and management plan was discussed with the patient. The patient verbalized understanding of and has agreed to the management plan. Patient is aware to call the clinic if symptoms persist or worsen. Patient is aware when to return to the clinic for a follow-up visit. Patient educated on when it is appropriate to go to the emergency department.   Time call ended:  2:23 pm  I provided 16 minutes of non-face-to-face time during this encounter.    Evelina Dun, FNP

## 2019-08-18 ENCOUNTER — Telehealth (INDEPENDENT_AMBULATORY_CARE_PROVIDER_SITE_OTHER): Payer: Medicare Other | Admitting: Cardiology

## 2019-08-18 ENCOUNTER — Encounter: Payer: Self-pay | Admitting: Cardiology

## 2019-08-18 ENCOUNTER — Other Ambulatory Visit: Payer: Self-pay

## 2019-08-18 VITALS — BP 162/67 | HR 58 | Ht 65.0 in | Wt 244.0 lb

## 2019-08-18 DIAGNOSIS — N189 Chronic kidney disease, unspecified: Secondary | ICD-10-CM | POA: Diagnosis not present

## 2019-08-18 DIAGNOSIS — S82851D Displaced trimalleolar fracture of right lower leg, subsequent encounter for closed fracture with routine healing: Secondary | ICD-10-CM | POA: Diagnosis not present

## 2019-08-18 DIAGNOSIS — I5032 Chronic diastolic (congestive) heart failure: Secondary | ICD-10-CM

## 2019-08-18 DIAGNOSIS — I130 Hypertensive heart and chronic kidney disease with heart failure and stage 1 through stage 4 chronic kidney disease, or unspecified chronic kidney disease: Secondary | ICD-10-CM | POA: Diagnosis not present

## 2019-08-18 DIAGNOSIS — I10 Essential (primary) hypertension: Secondary | ICD-10-CM

## 2019-08-18 DIAGNOSIS — K56609 Unspecified intestinal obstruction, unspecified as to partial versus complete obstruction: Secondary | ICD-10-CM | POA: Diagnosis not present

## 2019-08-18 DIAGNOSIS — K567 Ileus, unspecified: Secondary | ICD-10-CM | POA: Diagnosis not present

## 2019-08-18 DIAGNOSIS — I480 Paroxysmal atrial fibrillation: Secondary | ICD-10-CM

## 2019-08-18 NOTE — Patient Instructions (Signed)
Medication Instructions: Your physician recommends that you continue on your current medications as directed. Please refer to the Current Medication list given to you today.   Labwork: Cbc,bmet a few days BEFORE next visit in 6 months  Procedures/Testing: None  Follow-Up: 6 months with Dr.McDowell in the [place] office  Any Additional Special Instructions Will Be Listed Below (If Applicable).     If you need a refill on your cardiac medications before your next appointment, please call your pharmacy.     Thank you for choosing [facility] !

## 2019-08-18 NOTE — Progress Notes (Signed)
Virtual Visit via Telephone Note   This visit type was conducted due to national recommendations for restrictions regarding the COVID-19 Pandemic (e.g. social distancing) in an effort to limit this patient's exposure and mitigate transmission in our community.  Due to her co-morbid illnesses, this patient is at least at moderate risk for complications without adequate follow up.  This format is felt to be most appropriate for this patient at this time.  The patient did not have access to video technology/had technical difficulties with video requiring transitioning to audio format only (telephone).  All issues noted in this document were discussed and addressed.  No physical exam could be performed with this format.  Please refer to the patient's chart for her  consent to telehealth for [facility].   Date:  08/18/2019   ID:  Katherine Tyler, DOB [birth year], MRN [id number]  Patient Location: Home Provider Location: Office  PCP:  Sharion Balloon, FNP Cardiologist:  Rozann Lesches, MD Electrophysiologist:  None   Evaluation Performed:  Follow-Up Visit  Chief Complaint:   Cardiac follow-up  History of Present Illness:    Katherine Tyler is an 85 y.o. female [place].  We spoke by phone today, I also spoke with her daughter. I reviewed her records, she was recently hospitalized after a fall with subsequent right open displaced trimalleolar fracture.  She underwent ORIF of the right ankle per Dr. Doran Durand.  She did have postoperative anemia but was ultimately placed back on Eliquis.  I reviewed her recent ECG.  She is at home now with assistance from family.  She anticipates a recovery over the next 3 months or so, still following with her surgeon and progressing slowly.  She does not report any chest pain or palpitations at this time.  I reviewed her cardiac medications which are outlined below.  She does not report any spontaneous bleeding problems.  Recent lab work reviewed.  The patient does not  have symptoms concerning for COVID-19 infection (fever, chills, cough, or new shortness of breath).    Past Medical History:  Diagnosis Date  . Anxiety   . Arthritis   . Chronic diastolic CHF (congestive heart failure) ([place])   . GERD (gastroesophageal reflux disease)   . Hiatal hernia   . Hyperkalemia   . Hyperlipidemia   . Hypertension   . Hypothyroidism   . Iron deficiency anemia   . Obesity   . PAF (paroxysmal atrial fibrillation) ([place])    a. diagnosed in 01/2018. Started on Eliquis for anticoagulation.   . Vitamin D deficiency    Past Surgical History:  Procedure Laterality Date  . CHOLECYSTECTOMY    . I&D EXTREMITY Right 07/12/2019   Procedure: Irrigation And Debridement Extremity;  Surgeon: Wylene Simmer, MD;  Location: [facility];  Service: Orthopedics;  Laterality: Right;  . ORIF ANKLE FRACTURE Right 07/12/2019   Procedure: OPEN  AND REDUCTION INTERNAL FIXATION (ORIF) ANKLE FRACTURE;  Surgeon: Wylene Simmer, MD;  Location: [place];  Service: Orthopedics;  Laterality: Right;  . TUBAL LIGATION  1960     Current Meds  Medication Sig  . acetaminophen (TYLENOL) 325 MG tablet Take 1-2 tablets (325-650 mg total) by mouth every 4 (four) hours as needed for mild pain.  Marland Kitchen apixaban (ELIQUIS) 5 MG TABS tablet Take 1 tablet (5 mg total) by mouth 2 (two) times daily.  . Cholecalciferol (D3-1000) 1000 UNITS capsule Take 1,000 Units by mouth daily.   . citalopram (CELEXA) 20 MG tablet Take 1 tablet by  mouth once daily (Patient taking differently: Take 20 mg by mouth daily. )  . diclofenac sodium (VOLTAREN) 1 % GEL Apply 2 g topically 3 (three) times daily.  Marland Kitchen. diltiazem (CARDIZEM CD) 180 MG 24 hr capsule Take 1 capsule (180 mg total) by mouth 2 (two) times daily.  Marland Kitchen. docusate sodium (COLACE) 100 MG capsule Take 1 capsule (100 mg total) by mouth 2 (two) times daily.  . Ferrous Sulfate Dried (SLOW RELEASE IRON) 45 MG TBCR Take 45 mg by mouth 2 (two) times daily.  . furosemide (LASIX) 20 MG tablet Take  1 tablet (20 mg total) by mouth every other day.  . levothyroxine (SYNTHROID, LEVOTHROID) 125 MCG tablet Take 125 mcg by mouth daily before breakfast.  . losartan (COZAAR) 100 MG tablet Take 1 tablet (100 mg total) by mouth daily.  . magnesium oxide (MAG-OX) 400 (241.3 MG) MG tablet Take 1 tablet (400 mg total) by mouth daily.  . metoprolol succinate (TOPROL XL) 25 MG 24 hr tablet Take 1 tablet (25 mg total) by mouth daily.  Marland Kitchen. nystatin (MYCOSTATIN/NYSTOP) powder Apply topically 4 (four) times daily.  Marland Kitchen. oxyCODONE (OXY IR/ROXICODONE) 5 MG immediate release tablet Take 1 tablet (5 mg total) by mouth every 12 (twelve) hours as needed for severe pain.  . polyethylene glycol (MIRALAX / GLYCOLAX) 17 g packet Take 17 g by mouth daily.  . potassium chloride SA (K-DUR) 20 MEQ tablet Take 1 tablet (20 mEq total) by mouth daily.  Marland Kitchen. senna-docusate (SENOKOT-S) 8.6-50 MG tablet Take 2 tablets by mouth 2 (two) times daily. Adjust it up and down to make sure that you have a good BM daily.  Marland Kitchen. thiamine (VITAMIN B-1) 100 MG tablet Take 100 mg by mouth daily.     Allergies:   Terazosin hcl and Keflex [cephalexin]   Social History   Tobacco Use  . Smoking status: Never Smoker  . Smokeless tobacco: Former NeurosurgeonUser    Types: Snuff  Substance Use Topics  . Alcohol use: No  . Drug use: No     Family Hx: The patient's family history includes Diabetes in her daughter; Heart disease in her brother, brother, brother, mother, sister, sister, and sister; Stroke in her father.  ROS:   Please see the history of present illness.    Hearing loss. All other systems reviewed and are negative.   Prior CV studies:   The following studies were reviewed today:  Echocardiogram 01/15/2018: Study Conclusions  - Left ventricle: The cavity size was normal. Wall thickness was   increased in a pattern of mild LVH. Systolic function was normal.   The estimated ejection fraction was in the range of 60% to 65%.   Wall motion was  normal; there were no regional wall motion   abnormalities. Features are consistent with a pseudonormal left   ventricular filling pattern, with concomitant abnormal relaxation   and increased filling pressure (grade 2 diastolic dysfunction).   Doppler parameters are consistent with high ventricular filling   pressure. - Mitral valve: Calcified annulus. There was moderate   regurgitation. - Left atrium: The atrium was mildly dilated. - Tricuspid valve: There was moderate regurgitation. - Pulmonary arteries: PA peak pressure: 52 mm Hg (S).  Labs/Other Tests and Data Reviewed:    EKG:  An ECG dated 07/12/2019 was personally reviewed today and demonstrated:  Sinus rhythm with LVH and incomplete right bundle branch block.  Recent Labs: 07/14/2019: Magnesium 2.3 07/30/2019: ALT 16 08/03/2019: Hemoglobin 8.3; Platelets 194 08/05/2019: BUN 14;  Creatinine, Ser 1.11; Potassium 3.8; Sodium 138   Recent Lipid Panel Lab Results  Component Value Date/Time   CHOL 154 04/05/2014 10:51 AM   CHOL 168 04/16/2013 11:55 AM   TRIG 93 04/05/2014 10:51 AM   TRIG 85 04/16/2013 11:55 AM   HDL 59 04/05/2014 10:51 AM   HDL 58 04/16/2013 11:55 AM   LDLCALC 76 04/05/2014 10:51 AM   LDLCALC 93 04/16/2013 11:55 AM    Wt Readings from Last 3 Encounters:  08/18/19 244 lb (110.7 kg)  08/05/19 237 lb 3.4 oz (107.6 kg)  07/12/19 243 lb (110.2 kg)     Objective:    Vital Signs:  BP (!) 162/67   Pulse (!) 58   Ht 5\' 5"  (1.651 m)   Wt 244 lb (110.7 kg)   BMI 40.60 kg/m    Spoke in full sentences, not short of breath. No audible wheezing or coughing. Speech pattern normal.  ASSESSMENT & PLAN:    1.  Paroxysmal atrial fibrillation, no recent palpitations and in sinus rhythm by ECG in August.  Plan to continue current regimen including Toprol-XL, Cardizem CD, and Eliquis.  Recent lab work reviewed.  She denies any spontaneous bleeding problems.  2.  History of diastolic heart failure.  No change in present  Lasix dose.  She has intermittent leg swelling, sodium restriction discussed.  3.  Essential hypertension, blood pressure is elevated today.  Would keep follow-up with PCP.  COVID-19 Education: The signs and symptoms of COVID-19 were discussed with the patient and how to seek care for testing (follow up with PCP or arrange E-visit).  The importance of social distancing was discussed today.  Time:   Today, I have spent 5 minutes with the patient with telehealth technology discussing the above problems.     Medication Adjustments/Labs and Tests Ordered: Current medicines are reviewed at length with the patient today.  Concerns regarding medicines are outlined above.   Tests Ordered: Orders Placed This Encounter  Procedures  . CBC  . Basic Metabolic Panel (BMET)    Medication Changes: No orders of the defined types were placed in this encounter.   Follow Up:  In Person 6 months in the [place] office.  Signed, Nona Dell, MD  08/18/2019 10:53 AM    [facility]

## 2019-08-19 ENCOUNTER — Encounter: Payer: Medicare Other | Admitting: Physical Medicine and Rehabilitation

## 2019-08-19 ENCOUNTER — Ambulatory Visit (INDEPENDENT_AMBULATORY_CARE_PROVIDER_SITE_OTHER): Payer: Medicare Other

## 2019-08-19 ENCOUNTER — Other Ambulatory Visit: Payer: Self-pay

## 2019-08-19 DIAGNOSIS — K56609 Unspecified intestinal obstruction, unspecified as to partial versus complete obstruction: Secondary | ICD-10-CM

## 2019-08-19 DIAGNOSIS — E669 Obesity, unspecified: Secondary | ICD-10-CM

## 2019-08-19 DIAGNOSIS — S82851D Displaced trimalleolar fracture of right lower leg, subsequent encounter for closed fracture with routine healing: Secondary | ICD-10-CM | POA: Diagnosis not present

## 2019-08-19 DIAGNOSIS — D509 Iron deficiency anemia, unspecified: Secondary | ICD-10-CM

## 2019-08-19 DIAGNOSIS — D62 Acute posthemorrhagic anemia: Secondary | ICD-10-CM

## 2019-08-19 DIAGNOSIS — I5032 Chronic diastolic (congestive) heart failure: Secondary | ICD-10-CM

## 2019-08-19 DIAGNOSIS — E039 Hypothyroidism, unspecified: Secondary | ICD-10-CM

## 2019-08-19 DIAGNOSIS — N179 Acute kidney failure, unspecified: Secondary | ICD-10-CM

## 2019-08-19 DIAGNOSIS — I130 Hypertensive heart and chronic kidney disease with heart failure and stage 1 through stage 4 chronic kidney disease, or unspecified chronic kidney disease: Secondary | ICD-10-CM

## 2019-08-19 DIAGNOSIS — K567 Ileus, unspecified: Secondary | ICD-10-CM | POA: Diagnosis not present

## 2019-08-19 DIAGNOSIS — I480 Paroxysmal atrial fibrillation: Secondary | ICD-10-CM | POA: Diagnosis not present

## 2019-08-19 DIAGNOSIS — N189 Chronic kidney disease, unspecified: Secondary | ICD-10-CM | POA: Diagnosis not present

## 2019-08-19 DIAGNOSIS — E559 Vitamin D deficiency, unspecified: Secondary | ICD-10-CM

## 2019-08-19 DIAGNOSIS — K449 Diaphragmatic hernia without obstruction or gangrene: Secondary | ICD-10-CM | POA: Diagnosis not present

## 2019-08-19 DIAGNOSIS — M1990 Unspecified osteoarthritis, unspecified site: Secondary | ICD-10-CM

## 2019-08-19 DIAGNOSIS — K219 Gastro-esophageal reflux disease without esophagitis: Secondary | ICD-10-CM | POA: Diagnosis not present

## 2019-08-19 DIAGNOSIS — E785 Hyperlipidemia, unspecified: Secondary | ICD-10-CM

## 2019-08-19 DIAGNOSIS — Z79891 Long term (current) use of opiate analgesic: Secondary | ICD-10-CM

## 2019-08-19 DIAGNOSIS — E875 Hyperkalemia: Secondary | ICD-10-CM

## 2019-08-19 DIAGNOSIS — F419 Anxiety disorder, unspecified: Secondary | ICD-10-CM

## 2019-08-19 DIAGNOSIS — Z7901 Long term (current) use of anticoagulants: Secondary | ICD-10-CM

## 2019-08-20 DIAGNOSIS — N189 Chronic kidney disease, unspecified: Secondary | ICD-10-CM | POA: Diagnosis not present

## 2019-08-20 DIAGNOSIS — K56609 Unspecified intestinal obstruction, unspecified as to partial versus complete obstruction: Secondary | ICD-10-CM | POA: Diagnosis not present

## 2019-08-20 DIAGNOSIS — I5032 Chronic diastolic (congestive) heart failure: Secondary | ICD-10-CM | POA: Diagnosis not present

## 2019-08-20 DIAGNOSIS — K567 Ileus, unspecified: Secondary | ICD-10-CM | POA: Diagnosis not present

## 2019-08-20 DIAGNOSIS — I130 Hypertensive heart and chronic kidney disease with heart failure and stage 1 through stage 4 chronic kidney disease, or unspecified chronic kidney disease: Secondary | ICD-10-CM | POA: Diagnosis not present

## 2019-08-20 DIAGNOSIS — S82851D Displaced trimalleolar fracture of right lower leg, subsequent encounter for closed fracture with routine healing: Secondary | ICD-10-CM | POA: Diagnosis not present

## 2019-08-21 DIAGNOSIS — K567 Ileus, unspecified: Secondary | ICD-10-CM | POA: Diagnosis not present

## 2019-08-21 DIAGNOSIS — K56609 Unspecified intestinal obstruction, unspecified as to partial versus complete obstruction: Secondary | ICD-10-CM | POA: Diagnosis not present

## 2019-08-21 DIAGNOSIS — S82851D Displaced trimalleolar fracture of right lower leg, subsequent encounter for closed fracture with routine healing: Secondary | ICD-10-CM | POA: Diagnosis not present

## 2019-08-21 DIAGNOSIS — I130 Hypertensive heart and chronic kidney disease with heart failure and stage 1 through stage 4 chronic kidney disease, or unspecified chronic kidney disease: Secondary | ICD-10-CM | POA: Diagnosis not present

## 2019-08-21 DIAGNOSIS — I5032 Chronic diastolic (congestive) heart failure: Secondary | ICD-10-CM | POA: Diagnosis not present

## 2019-08-21 DIAGNOSIS — N189 Chronic kidney disease, unspecified: Secondary | ICD-10-CM | POA: Diagnosis not present

## 2019-08-24 DIAGNOSIS — K567 Ileus, unspecified: Secondary | ICD-10-CM | POA: Diagnosis not present

## 2019-08-24 DIAGNOSIS — K56609 Unspecified intestinal obstruction, unspecified as to partial versus complete obstruction: Secondary | ICD-10-CM | POA: Diagnosis not present

## 2019-08-24 DIAGNOSIS — S82851D Displaced trimalleolar fracture of right lower leg, subsequent encounter for closed fracture with routine healing: Secondary | ICD-10-CM | POA: Diagnosis not present

## 2019-08-24 DIAGNOSIS — N189 Chronic kidney disease, unspecified: Secondary | ICD-10-CM | POA: Diagnosis not present

## 2019-08-24 DIAGNOSIS — I130 Hypertensive heart and chronic kidney disease with heart failure and stage 1 through stage 4 chronic kidney disease, or unspecified chronic kidney disease: Secondary | ICD-10-CM | POA: Diagnosis not present

## 2019-08-24 DIAGNOSIS — I5032 Chronic diastolic (congestive) heart failure: Secondary | ICD-10-CM | POA: Diagnosis not present

## 2019-08-26 DIAGNOSIS — Z5189 Encounter for other specified aftercare: Secondary | ICD-10-CM | POA: Diagnosis not present

## 2019-08-26 DIAGNOSIS — S82851F Displaced trimalleolar fracture of right lower leg, subsequent encounter for open fracture type IIIA, IIIB, or IIIC with routine healing: Secondary | ICD-10-CM | POA: Diagnosis not present

## 2019-08-30 ENCOUNTER — Other Ambulatory Visit: Payer: Self-pay | Admitting: Physical Medicine and Rehabilitation

## 2019-08-31 DIAGNOSIS — S82851D Displaced trimalleolar fracture of right lower leg, subsequent encounter for closed fracture with routine healing: Secondary | ICD-10-CM | POA: Diagnosis not present

## 2019-08-31 DIAGNOSIS — K567 Ileus, unspecified: Secondary | ICD-10-CM | POA: Diagnosis not present

## 2019-08-31 DIAGNOSIS — K56609 Unspecified intestinal obstruction, unspecified as to partial versus complete obstruction: Secondary | ICD-10-CM | POA: Diagnosis not present

## 2019-08-31 DIAGNOSIS — I130 Hypertensive heart and chronic kidney disease with heart failure and stage 1 through stage 4 chronic kidney disease, or unspecified chronic kidney disease: Secondary | ICD-10-CM | POA: Diagnosis not present

## 2019-08-31 DIAGNOSIS — I5032 Chronic diastolic (congestive) heart failure: Secondary | ICD-10-CM | POA: Diagnosis not present

## 2019-08-31 DIAGNOSIS — N189 Chronic kidney disease, unspecified: Secondary | ICD-10-CM | POA: Diagnosis not present

## 2019-08-31 NOTE — Telephone Encounter (Signed)
Not clear why this was sent to me.  Will forward to nursing.

## 2019-09-05 DIAGNOSIS — F419 Anxiety disorder, unspecified: Secondary | ICD-10-CM | POA: Diagnosis not present

## 2019-09-05 DIAGNOSIS — D62 Acute posthemorrhagic anemia: Secondary | ICD-10-CM | POA: Diagnosis not present

## 2019-09-05 DIAGNOSIS — E669 Obesity, unspecified: Secondary | ICD-10-CM | POA: Diagnosis not present

## 2019-09-05 DIAGNOSIS — I480 Paroxysmal atrial fibrillation: Secondary | ICD-10-CM | POA: Diagnosis not present

## 2019-09-05 DIAGNOSIS — D509 Iron deficiency anemia, unspecified: Secondary | ICD-10-CM | POA: Diagnosis not present

## 2019-09-05 DIAGNOSIS — E785 Hyperlipidemia, unspecified: Secondary | ICD-10-CM | POA: Diagnosis not present

## 2019-09-05 DIAGNOSIS — I130 Hypertensive heart and chronic kidney disease with heart failure and stage 1 through stage 4 chronic kidney disease, or unspecified chronic kidney disease: Secondary | ICD-10-CM | POA: Diagnosis not present

## 2019-09-05 DIAGNOSIS — Z79891 Long term (current) use of opiate analgesic: Secondary | ICD-10-CM | POA: Diagnosis not present

## 2019-09-05 DIAGNOSIS — N179 Acute kidney failure, unspecified: Secondary | ICD-10-CM | POA: Diagnosis not present

## 2019-09-05 DIAGNOSIS — M1990 Unspecified osteoarthritis, unspecified site: Secondary | ICD-10-CM | POA: Diagnosis not present

## 2019-09-05 DIAGNOSIS — K56609 Unspecified intestinal obstruction, unspecified as to partial versus complete obstruction: Secondary | ICD-10-CM | POA: Diagnosis not present

## 2019-09-05 DIAGNOSIS — E559 Vitamin D deficiency, unspecified: Secondary | ICD-10-CM | POA: Diagnosis not present

## 2019-09-05 DIAGNOSIS — N189 Chronic kidney disease, unspecified: Secondary | ICD-10-CM | POA: Diagnosis not present

## 2019-09-05 DIAGNOSIS — S82851D Displaced trimalleolar fracture of right lower leg, subsequent encounter for closed fracture with routine healing: Secondary | ICD-10-CM | POA: Diagnosis not present

## 2019-09-05 DIAGNOSIS — E039 Hypothyroidism, unspecified: Secondary | ICD-10-CM | POA: Diagnosis not present

## 2019-09-05 DIAGNOSIS — K449 Diaphragmatic hernia without obstruction or gangrene: Secondary | ICD-10-CM | POA: Diagnosis not present

## 2019-09-05 DIAGNOSIS — E875 Hyperkalemia: Secondary | ICD-10-CM | POA: Diagnosis not present

## 2019-09-05 DIAGNOSIS — I5032 Chronic diastolic (congestive) heart failure: Secondary | ICD-10-CM | POA: Diagnosis not present

## 2019-09-05 DIAGNOSIS — K567 Ileus, unspecified: Secondary | ICD-10-CM | POA: Diagnosis not present

## 2019-09-05 DIAGNOSIS — K219 Gastro-esophageal reflux disease without esophagitis: Secondary | ICD-10-CM | POA: Diagnosis not present

## 2019-09-05 DIAGNOSIS — Z7901 Long term (current) use of anticoagulants: Secondary | ICD-10-CM | POA: Diagnosis not present

## 2019-09-15 DIAGNOSIS — I5032 Chronic diastolic (congestive) heart failure: Secondary | ICD-10-CM | POA: Diagnosis not present

## 2019-09-15 DIAGNOSIS — N189 Chronic kidney disease, unspecified: Secondary | ICD-10-CM | POA: Diagnosis not present

## 2019-09-15 DIAGNOSIS — S82851D Displaced trimalleolar fracture of right lower leg, subsequent encounter for closed fracture with routine healing: Secondary | ICD-10-CM | POA: Diagnosis not present

## 2019-09-15 DIAGNOSIS — K567 Ileus, unspecified: Secondary | ICD-10-CM | POA: Diagnosis not present

## 2019-09-15 DIAGNOSIS — K56609 Unspecified intestinal obstruction, unspecified as to partial versus complete obstruction: Secondary | ICD-10-CM | POA: Diagnosis not present

## 2019-09-15 DIAGNOSIS — I130 Hypertensive heart and chronic kidney disease with heart failure and stage 1 through stage 4 chronic kidney disease, or unspecified chronic kidney disease: Secondary | ICD-10-CM | POA: Diagnosis not present

## 2019-09-24 DIAGNOSIS — Z5189 Encounter for other specified aftercare: Secondary | ICD-10-CM | POA: Diagnosis not present

## 2019-09-24 DIAGNOSIS — S82851F Displaced trimalleolar fracture of right lower leg, subsequent encounter for open fracture type IIIA, IIIB, or IIIC with routine healing: Secondary | ICD-10-CM | POA: Diagnosis not present

## 2019-09-24 DIAGNOSIS — M25571 Pain in right ankle and joints of right foot: Secondary | ICD-10-CM | POA: Diagnosis not present

## 2019-10-17 ENCOUNTER — Other Ambulatory Visit: Payer: Self-pay | Admitting: Family

## 2019-10-17 DIAGNOSIS — F411 Generalized anxiety disorder: Secondary | ICD-10-CM

## 2019-11-02 ENCOUNTER — Other Ambulatory Visit: Payer: Self-pay | Admitting: *Deleted

## 2019-11-02 MED ORDER — DILTIAZEM HCL ER COATED BEADS 180 MG PO CP24: 180.0000 mg | ORAL_CAPSULE | Freq: Two times a day (BID) | ORAL | 4 refills | Status: DC

## 2019-11-29 ENCOUNTER — Other Ambulatory Visit: Payer: Self-pay | Admitting: Cardiology

## 2019-11-29 DIAGNOSIS — I10 Essential (primary) hypertension: Secondary | ICD-10-CM

## 2020-01-20 ENCOUNTER — Emergency Department (HOSPITAL_COMMUNITY): Payer: Medicare Other

## 2020-01-20 ENCOUNTER — Encounter (HOSPITAL_COMMUNITY): Payer: Self-pay

## 2020-01-20 ENCOUNTER — Inpatient Hospital Stay (HOSPITAL_COMMUNITY)
Admission: EM | Admit: 2020-01-20 | Discharge: 2020-01-23 | DRG: 640 | Disposition: A | Discharge status: Home Health | Payer: Medicare Other | Attending: Family Medicine | Admitting: Family Medicine

## 2020-01-20 DIAGNOSIS — E860 Dehydration: Secondary | ICD-10-CM | POA: Diagnosis present

## 2020-01-20 DIAGNOSIS — E875 Hyperkalemia: Principal | ICD-10-CM | POA: Diagnosis present

## 2020-01-20 DIAGNOSIS — Z87891 Personal history of nicotine dependence: Secondary | ICD-10-CM

## 2020-01-20 DIAGNOSIS — R0602 Shortness of breath: Secondary | ICD-10-CM | POA: Diagnosis not present

## 2020-01-20 DIAGNOSIS — E871 Hypo-osmolality and hyponatremia: Secondary | ICD-10-CM | POA: Diagnosis present

## 2020-01-20 DIAGNOSIS — Z881 Allergy status to other antibiotic agents status: Secondary | ICD-10-CM

## 2020-01-20 DIAGNOSIS — I10 Essential (primary) hypertension: Secondary | ICD-10-CM

## 2020-01-20 DIAGNOSIS — R069 Unspecified abnormalities of breathing: Secondary | ICD-10-CM | POA: Diagnosis not present

## 2020-01-20 DIAGNOSIS — Z20822 Contact with and (suspected) exposure to covid-19: Secondary | ICD-10-CM | POA: Diagnosis present

## 2020-01-20 DIAGNOSIS — N189 Chronic kidney disease, unspecified: Secondary | ICD-10-CM | POA: Diagnosis present

## 2020-01-20 DIAGNOSIS — Z23 Encounter for immunization: Secondary | ICD-10-CM

## 2020-01-20 DIAGNOSIS — E559 Vitamin D deficiency, unspecified: Secondary | ICD-10-CM | POA: Diagnosis present

## 2020-01-20 DIAGNOSIS — I4891 Unspecified atrial fibrillation: Secondary | ICD-10-CM | POA: Diagnosis present

## 2020-01-20 DIAGNOSIS — N179 Acute kidney failure, unspecified: Secondary | ICD-10-CM | POA: Diagnosis present

## 2020-01-20 DIAGNOSIS — I272 Pulmonary hypertension, unspecified: Secondary | ICD-10-CM | POA: Diagnosis present

## 2020-01-20 DIAGNOSIS — R0902 Hypoxemia: Secondary | ICD-10-CM | POA: Diagnosis not present

## 2020-01-20 DIAGNOSIS — I130 Hypertensive heart and chronic kidney disease with heart failure and stage 1 through stage 4 chronic kidney disease, or unspecified chronic kidney disease: Secondary | ICD-10-CM | POA: Diagnosis present

## 2020-01-20 DIAGNOSIS — I480 Paroxysmal atrial fibrillation: Secondary | ICD-10-CM | POA: Diagnosis present

## 2020-01-20 DIAGNOSIS — E039 Hypothyroidism, unspecified: Secondary | ICD-10-CM | POA: Diagnosis present

## 2020-01-20 DIAGNOSIS — M1990 Unspecified osteoarthritis, unspecified site: Secondary | ICD-10-CM | POA: Diagnosis present

## 2020-01-20 DIAGNOSIS — R001 Bradycardia, unspecified: Secondary | ICD-10-CM | POA: Diagnosis present

## 2020-01-20 DIAGNOSIS — J9601 Acute respiratory failure with hypoxia: Secondary | ICD-10-CM | POA: Diagnosis not present

## 2020-01-20 DIAGNOSIS — N1832 Chronic kidney disease, stage 3b: Secondary | ICD-10-CM | POA: Diagnosis present

## 2020-01-20 DIAGNOSIS — J209 Acute bronchitis, unspecified: Secondary | ICD-10-CM | POA: Diagnosis present

## 2020-01-20 DIAGNOSIS — Z888 Allergy status to other drugs, medicaments and biological substances status: Secondary | ICD-10-CM

## 2020-01-20 DIAGNOSIS — D631 Anemia in chronic kidney disease: Secondary | ICD-10-CM | POA: Diagnosis present

## 2020-01-20 DIAGNOSIS — R062 Wheezing: Secondary | ICD-10-CM | POA: Diagnosis not present

## 2020-01-20 DIAGNOSIS — Z8249 Family history of ischemic heart disease and other diseases of the circulatory system: Secondary | ICD-10-CM

## 2020-01-20 DIAGNOSIS — Z79899 Other long term (current) drug therapy: Secondary | ICD-10-CM

## 2020-01-20 DIAGNOSIS — R0689 Other abnormalities of breathing: Secondary | ICD-10-CM | POA: Diagnosis not present

## 2020-01-20 DIAGNOSIS — E878 Other disorders of electrolyte and fluid balance, not elsewhere classified: Secondary | ICD-10-CM | POA: Diagnosis present

## 2020-01-20 DIAGNOSIS — I5032 Chronic diastolic (congestive) heart failure: Secondary | ICD-10-CM | POA: Diagnosis present

## 2020-01-20 DIAGNOSIS — Z9049 Acquired absence of other specified parts of digestive tract: Secondary | ICD-10-CM

## 2020-01-20 DIAGNOSIS — F419 Anxiety disorder, unspecified: Secondary | ICD-10-CM | POA: Diagnosis present

## 2020-01-20 DIAGNOSIS — E669 Obesity, unspecified: Secondary | ICD-10-CM | POA: Diagnosis present

## 2020-01-20 DIAGNOSIS — D509 Iron deficiency anemia, unspecified: Secondary | ICD-10-CM | POA: Diagnosis present

## 2020-01-20 DIAGNOSIS — I081 Rheumatic disorders of both mitral and tricuspid valves: Secondary | ICD-10-CM | POA: Diagnosis present

## 2020-01-20 DIAGNOSIS — Z6835 Body mass index (BMI) 35.0-35.9, adult: Secondary | ICD-10-CM

## 2020-01-20 DIAGNOSIS — K219 Gastro-esophageal reflux disease without esophagitis: Secondary | ICD-10-CM | POA: Diagnosis present

## 2020-01-20 DIAGNOSIS — E785 Hyperlipidemia, unspecified: Secondary | ICD-10-CM | POA: Diagnosis present

## 2020-01-20 DIAGNOSIS — Z7901 Long term (current) use of anticoagulants: Secondary | ICD-10-CM

## 2020-01-20 LAB — RENAL FUNCTION PANEL
Albumin: 4.1 g/dL (ref 3.5–5.0)
Anion gap: 9 (ref 5–15)
BUN: 42 mg/dL — ABNORMAL HIGH (ref 8–23)
CO2: 22 mmol/L (ref 22–32)
Calcium: 9.6 mg/dL (ref 8.9–10.3)
Chloride: 90 mmol/L — ABNORMAL LOW (ref 98–111)
Creatinine, Ser: 1.93 mg/dL — ABNORMAL HIGH (ref 0.44–1.00)
GFR calc Af Amer: 26 mL/min — ABNORMAL LOW (ref >60)
GFR calc non Af Amer: 23 mL/min — ABNORMAL LOW (ref >60)
Glucose, Bld: 119 mg/dL — ABNORMAL HIGH (ref 70–99)
Phosphorus: 3.4 mg/dL (ref 2.5–4.6)
Potassium: 6.6 mmol/L (ref 3.5–5.1)
Sodium: 121 mmol/L — ABNORMAL LOW (ref 135–145)

## 2020-01-20 LAB — CBC WITH DIFFERENTIAL/PLATELET
Abs Immature Granulocytes: 0.04 10*3/uL (ref 0.00–0.07)
Basophils Absolute: 0 10*3/uL (ref 0.0–0.1)
Basophils Relative: 0 %
Eosinophils Absolute: 0 10*3/uL (ref 0.0–0.5)
Eosinophils Relative: 0 %
HCT: 29.8 % — ABNORMAL LOW (ref 36.0–46.0)
Hemoglobin: 9.3 g/dL — ABNORMAL LOW (ref 12.0–15.0)
Immature Granulocytes: 0 %
Lymphocytes Relative: 3 %
Lymphs Abs: 0.3 10*3/uL — ABNORMAL LOW (ref 0.7–4.0)
MCH: 32.5 pg (ref 26.0–34.0)
MCHC: 31.2 g/dL (ref 30.0–36.0)
MCV: 104.2 fL — ABNORMAL HIGH (ref 80.0–100.0)
Monocytes Absolute: 0.8 10*3/uL (ref 0.1–1.0)
Monocytes Relative: 8 %
Neutro Abs: 9 10*3/uL — ABNORMAL HIGH (ref 1.7–7.7)
Neutrophils Relative %: 89 %
Platelets: 125 10*3/uL — ABNORMAL LOW (ref 150–400)
RBC: 2.86 MIL/uL — ABNORMAL LOW (ref 3.87–5.11)
RDW: 13.9 % (ref 11.5–15.5)
WBC: 10.2 10*3/uL (ref 4.0–10.5)
nRBC: 0 % (ref 0.0–0.2)

## 2020-01-20 LAB — URINALYSIS, ROUTINE W REFLEX MICROSCOPIC
Bilirubin Urine: NEGATIVE
Glucose, UA: NEGATIVE mg/dL
Hgb urine dipstick: NEGATIVE
Ketones, ur: NEGATIVE mg/dL
Nitrite: NEGATIVE
Protein, ur: 30 mg/dL — AB
Specific Gravity, Urine: 1.016 (ref 1.005–1.030)
pH: 5 (ref 5.0–8.0)

## 2020-01-20 LAB — BASIC METABOLIC PANEL
Anion gap: 8 (ref 5–15)
Anion gap: 11 (ref 5–15)
BUN: 43 mg/dL — ABNORMAL HIGH (ref 8–23)
BUN: 43 mg/dL — ABNORMAL HIGH (ref 8–23)
CO2: 21 mmol/L — ABNORMAL LOW (ref 22–32)
CO2: 21 mmol/L — ABNORMAL LOW (ref 22–32)
Calcium: 9.5 mg/dL (ref 8.9–10.3)
Calcium: 9.7 mg/dL (ref 8.9–10.3)
Chloride: 91 mmol/L — ABNORMAL LOW (ref 98–111)
Chloride: 91 mmol/L — ABNORMAL LOW (ref 98–111)
Creatinine, Ser: 1.92 mg/dL — ABNORMAL HIGH (ref 0.44–1.00)
Creatinine, Ser: 1.83 mg/dL — ABNORMAL HIGH (ref 0.44–1.00)
GFR calc Af Amer: 26 mL/min — ABNORMAL LOW (ref >60)
GFR calc Af Amer: 28 mL/min — ABNORMAL LOW (ref >60)
GFR calc non Af Amer: 23 mL/min — ABNORMAL LOW (ref >60)
GFR calc non Af Amer: 24 mL/min — ABNORMAL LOW (ref >60)
Glucose, Bld: 118 mg/dL — ABNORMAL HIGH (ref 70–99)
Glucose, Bld: 147 mg/dL — ABNORMAL HIGH (ref 70–99)
Potassium: 6.6 mmol/L (ref 3.5–5.1)
Potassium: 5.3 mmol/L — ABNORMAL HIGH (ref 3.5–5.1)
Sodium: 120 mmol/L — ABNORMAL LOW (ref 135–145)
Sodium: 123 mmol/L — ABNORMAL LOW (ref 135–145)

## 2020-01-20 LAB — RESPIRATORY PANEL BY RT PCR (FLU A&B, COVID)
Influenza A by PCR: NEGATIVE
Influenza B by PCR: NEGATIVE
SARS Coronavirus 2 by RT PCR: NEGATIVE

## 2020-01-20 LAB — MAGNESIUM: Magnesium: 2.5 mg/dL — ABNORMAL HIGH (ref 1.7–2.4)

## 2020-01-20 LAB — BRAIN NATRIURETIC PEPTIDE: B Natriuretic Peptide: 1681 pg/mL — ABNORMAL HIGH (ref 0.0–100.0)

## 2020-01-20 LAB — TSH: TSH: 32.62 u[IU]/mL — ABNORMAL HIGH (ref 0.350–4.500)

## 2020-01-20 MED ORDER — SODIUM CHLORIDE 0.9% FLUSH: 3.0000 mL | INTRAVENOUS | Status: DC | PRN

## 2020-01-20 MED ORDER — METHYLPREDNISOLONE SODIUM SUCC 40 MG IJ SOLR
40.0000 mg | Freq: Three times a day (TID) | INTRAMUSCULAR | Status: DC
  Administered 2020-01-20 – 2020-01-22 (×5): 40 mg via INTRAVENOUS
  Filled 2020-01-20 (×5): qty 1

## 2020-01-20 MED ORDER — SODIUM ZIRCONIUM CYCLOSILICATE 5 G PO PACK
5.0000 g | PACK | Freq: Once | ORAL | Status: AC
  Administered 2020-01-20: 5 g via ORAL
  Filled 2020-01-20: qty 1

## 2020-01-20 MED ORDER — SODIUM BICARBONATE 8.4 % IV SOLN
INTRAVENOUS | Status: AC
  Filled 2020-01-20: qty 50

## 2020-01-20 MED ORDER — SODIUM CHLORIDE 0.9 % IV SOLN: INTRAVENOUS | Status: DC

## 2020-01-20 MED ORDER — CHLORHEXIDINE GLUCONATE 0.12 % MT SOLN
15.0000 mL | Freq: Two times a day (BID) | OROMUCOSAL | Status: DC
  Administered 2020-01-21 – 2020-01-23 (×5): 15 mL via OROMUCOSAL
  Filled 2020-01-20 (×5): qty 15

## 2020-01-20 MED ORDER — SODIUM ZIRCONIUM CYCLOSILICATE 5 G PO PACK: 10.0000 g | PACK | Freq: Once | ORAL | Status: DC

## 2020-01-20 MED ORDER — SODIUM CHLORIDE 0.9 % IV SOLN: 1.0000 g | Freq: Once | INTRAVENOUS | Status: DC

## 2020-01-20 MED ORDER — INFLUENZA VAC A&B SA ADJ QUAD 0.5 ML IM PRSY
0.5000 mL | PREFILLED_SYRINGE | INTRAMUSCULAR | Status: AC
  Administered 2020-01-21: 0.5 mL via INTRAMUSCULAR
  Filled 2020-01-20: qty 0.5

## 2020-01-20 MED ORDER — SODIUM POLYSTYRENE SULFONATE 15 GM/60ML PO SUSP
30.0000 g | Freq: Once | ORAL | Status: AC
  Administered 2020-01-20: 30 g via ORAL
  Filled 2020-01-20: qty 120

## 2020-01-20 MED ORDER — VITAMIN D 25 MCG (1000 UNIT) PO TABS
1000.0000 [IU] | ORAL_TABLET | Freq: Two times a day (BID) | ORAL | Status: DC
  Administered 2020-01-20 – 2020-01-23 (×6): 1000 [IU] via ORAL
  Filled 2020-01-20 (×6): qty 1

## 2020-01-20 MED ORDER — SODIUM CHLORIDE 0.9 % IV SOLN: 250.0000 mL | INTRAVENOUS | Status: DC | PRN

## 2020-01-20 MED ORDER — INSULIN ASPART 100 UNIT/ML ~~LOC~~ SOLN: 10.0000 [IU] | Freq: Once | SUBCUTANEOUS | Status: DC

## 2020-01-20 MED ORDER — DEXTROSE 50 % IV SOLN: 50.0000 mL | Freq: Once | INTRAVENOUS | Status: DC

## 2020-01-20 MED ORDER — ONDANSETRON HCL 4 MG/2ML IJ SOLN: 4.0000 mg | Freq: Four times a day (QID) | INTRAMUSCULAR | Status: DC | PRN

## 2020-01-20 MED ORDER — HYDRALAZINE HCL 20 MG/ML IJ SOLN
10.0000 mg | Freq: Four times a day (QID) | INTRAMUSCULAR | Status: DC | PRN
  Administered 2020-01-23: 10 mg via INTRAVENOUS
  Filled 2020-01-20 (×2): qty 1

## 2020-01-20 MED ORDER — SODIUM ZIRCONIUM CYCLOSILICATE 5 G PO PACK
10.0000 g | PACK | Freq: Once | ORAL | Status: AC
  Administered 2020-01-20: 10 g via ORAL
  Filled 2020-01-20: qty 2

## 2020-01-20 MED ORDER — APIXABAN 5 MG PO TABS
5.0000 mg | ORAL_TABLET | Freq: Two times a day (BID) | ORAL | Status: DC
  Administered 2020-01-20 – 2020-01-21 (×2): 5 mg via ORAL
  Filled 2020-01-20 (×2): qty 1

## 2020-01-20 MED ORDER — CHOLECALCIFEROL 25 MCG (1000 UT) PO CAPS: 1000.0000 [IU] | ORAL_CAPSULE | Freq: Two times a day (BID) | ORAL | Status: DC

## 2020-01-20 MED ORDER — SODIUM BICARBONATE 8.4 % IV SOLN
INTRAVENOUS | Status: DC
  Filled 2020-01-20 (×4): qty 50

## 2020-01-20 MED ORDER — SODIUM CHLORIDE 0.9% FLUSH
3.0000 mL | Freq: Two times a day (BID) | INTRAVENOUS | Status: DC
  Administered 2020-01-20: 23:00:00 3 mL via INTRAVENOUS

## 2020-01-20 MED ORDER — SODIUM POLYSTYRENE SULFONATE 15 GM/60ML PO SUSP: 30.0000 g | Freq: Once | ORAL | Status: DC

## 2020-01-20 MED ORDER — ACETAMINOPHEN 325 MG PO TABS: 650.0000 mg | ORAL_TABLET | Freq: Four times a day (QID) | ORAL | Status: DC | PRN

## 2020-01-20 MED ORDER — POLYETHYLENE GLYCOL 3350 17 G PO PACK: 17.0000 g | PACK | Freq: Every day | ORAL | Status: DC

## 2020-01-20 MED ORDER — ACETAMINOPHEN 650 MG RE SUPP: 650.0000 mg | Freq: Four times a day (QID) | RECTAL | Status: DC | PRN

## 2020-01-20 MED ORDER — ALBUTEROL (5 MG/ML) CONTINUOUS INHALATION SOLN
5.0000 mg/h | INHALATION_SOLUTION | RESPIRATORY_TRACT | Status: DC
  Administered 2020-01-20: 5 mg/h via RESPIRATORY_TRACT
  Filled 2020-01-20: qty 20

## 2020-01-20 MED ORDER — FUROSEMIDE 10 MG/ML IJ SOLN: 40.0000 mg | Freq: Once | INTRAMUSCULAR | Status: DC

## 2020-01-20 MED ORDER — TRAZODONE HCL 50 MG PO TABS
50.0000 mg | ORAL_TABLET | Freq: Every evening | ORAL | Status: DC | PRN
  Administered 2020-01-21 – 2020-01-22 (×2): 50 mg via ORAL
  Filled 2020-01-20 (×2): qty 1

## 2020-01-20 MED ORDER — SODIUM BICARBONATE 8.4 % IV SOLN
50.0000 meq | Freq: Once | INTRAVENOUS | Status: AC
  Administered 2020-01-20: 18:00:00 50 meq via INTRAVENOUS

## 2020-01-20 MED ORDER — FERROUS SULFATE DRIED ER 45 MG PO TBCR: 45.0000 mg | EXTENDED_RELEASE_TABLET | Freq: Two times a day (BID) | ORAL | Status: DC

## 2020-01-20 MED ORDER — CALCIUM GLUCONATE-NACL 1-0.675 GM/50ML-% IV SOLN
1.0000 g | Freq: Once | INTRAVENOUS | Status: AC
  Administered 2020-01-20: 1000 mg via INTRAVENOUS
  Filled 2020-01-20: qty 50

## 2020-01-20 MED ORDER — ALBUTEROL SULFATE (2.5 MG/3ML) 0.083% IN NEBU
2.5000 mg | INHALATION_SOLUTION | RESPIRATORY_TRACT | Status: AC
  Administered 2020-01-21 (×5): 2.5 mg via RESPIRATORY_TRACT
  Filled 2020-01-20 (×5): qty 3

## 2020-01-20 MED ORDER — SODIUM BICARBONATE 8.4 % IV SOLN
INTRAVENOUS | Status: DC
  Filled 2020-01-20 (×2): qty 50

## 2020-01-20 MED ORDER — THIAMINE HCL 100 MG PO TABS
100.0000 mg | ORAL_TABLET | Freq: Every morning | ORAL | Status: DC
  Administered 2020-01-21 – 2020-01-23 (×3): 100 mg via ORAL
  Filled 2020-01-20 (×3): qty 1

## 2020-01-20 MED ORDER — ONDANSETRON HCL 4 MG PO TABS: 4.0000 mg | ORAL_TABLET | Freq: Four times a day (QID) | ORAL | Status: DC | PRN

## 2020-01-20 MED ORDER — CITALOPRAM HYDROBROMIDE 20 MG PO TABS
20.0000 mg | ORAL_TABLET | Freq: Every day | ORAL | Status: DC
  Administered 2020-01-21 – 2020-01-23 (×3): 20 mg via ORAL
  Filled 2020-01-20 (×3): qty 1

## 2020-01-20 MED ORDER — ALBUTEROL SULFATE HFA 108 (90 BASE) MCG/ACT IN AERS: 2.0000 | INHALATION_SPRAY | RESPIRATORY_TRACT | Status: DC

## 2020-01-20 MED ORDER — POLYETHYLENE GLYCOL 3350 17 G PO PACK: 17.0000 g | PACK | Freq: Every day | ORAL | Status: DC | PRN

## 2020-01-20 MED ORDER — INSULIN ASPART 100 UNIT/ML ~~LOC~~ SOLN
6.0000 [IU] | Freq: Once | SUBCUTANEOUS | Status: AC
  Administered 2020-01-20: 17:00:00 6 [IU] via INTRAVENOUS
  Filled 2020-01-20: qty 1

## 2020-01-20 MED ORDER — DEXTROSE 50 % IV SOLN
1.0000 | Freq: Once | INTRAVENOUS | Status: AC
  Administered 2020-01-20: 17:00:00 50 mL via INTRAVENOUS
  Filled 2020-01-20: qty 50

## 2020-01-20 MED ORDER — LEVOTHYROXINE SODIUM 75 MCG PO TABS
175.0000 ug | ORAL_TABLET | Freq: Every day | ORAL | Status: DC
  Administered 2020-01-21 – 2020-01-23 (×3): 175 ug via ORAL
  Filled 2020-01-20 (×3): qty 1

## 2020-01-20 MED ORDER — METHYLPREDNISOLONE SODIUM SUCC 125 MG IJ SOLR
125.0000 mg | Freq: Once | INTRAMUSCULAR | Status: AC
  Administered 2020-01-20: 125 mg via INTRAVENOUS
  Filled 2020-01-20: qty 2

## 2020-01-20 MED ORDER — FERROUS SULFATE 325 (65 FE) MG PO TABS
325.0000 mg | ORAL_TABLET | Freq: Two times a day (BID) | ORAL | Status: DC
  Administered 2020-01-21 – 2020-01-23 (×5): 325 mg via ORAL
  Filled 2020-01-20 (×5): qty 1

## 2020-01-20 MED ORDER — SENNOSIDES-DOCUSATE SODIUM 8.6-50 MG PO TABS
2.0000 | ORAL_TABLET | Freq: Two times a day (BID) | ORAL | Status: DC
  Administered 2020-01-21 – 2020-01-23 (×5): 2 via ORAL
  Filled 2020-01-20 (×5): qty 2

## 2020-01-20 MED ORDER — ORAL CARE MOUTH RINSE
15.0000 mL | Freq: Two times a day (BID) | OROMUCOSAL | Status: DC
  Administered 2020-01-21: 12:00:00 15 mL via OROMUCOSAL

## 2020-01-20 MED ORDER — CALCIUM GLUCONATE-NACL 1-0.675 GM/50ML-% IV SOLN: 1.0000 g | Freq: Once | INTRAVENOUS | Status: DC

## 2020-01-20 MED ORDER — LEVOTHYROXINE SODIUM 25 MCG PO TABS: 125.0000 ug | ORAL_TABLET | Freq: Every day | ORAL | Status: DC

## 2020-01-20 NOTE — ED Provider Notes (Signed)
[facility] EMERGENCY DEPARTMENT Provider Note   CSN: [id number] Arrival date & time: 01/20/20  1316     History Chief Complaint  Patient presents with  . Shortness of Breath    Katherine Tyler is a 86 y.o. female.  HPI Patient presents to the emergency department with increasing shortness of breath over the last 2 days.  The patient states that increasing shortness of breath over the last 2 days.  The patient states that she feels like she gets more short of breath when she exerts herself.  The patient states that she has had no new medications recently.  Patient states that she has not had any pain associated with this.  Patient states that the EMS placed her on the oxygen because her oxygen levels were low.  The patient denies chest pain,headache,blurred vision, neck pain, fever, cough, weakness, numbness, dizziness, anorexia, edema, abdominal pain, nausea, vomiting, diarrhea, rash, back pain, dysuria, hematemesis, bloody stool, near syncope, or syncope.  Past Medical History:  Diagnosis Date  . Anxiety   . Arthritis   . Chronic diastolic CHF (congestive heart failure) (HCC)   . GERD (gastroesophageal reflux disease)   . Hiatal hernia   . Hyperkalemia   . Hyperlipidemia   . Hypertension   . Hypothyroidism   . Iron deficiency anemia   . Obesity   . PAF (paroxysmal atrial fibrillation) (HCC)    a. diagnosed in 01/2018. Started on Eliquis for anticoagulation.   . Vitamin D deficiency     Patient Active Problem List   Diagnosis Date Noted  . Labile blood pressure   . Closed fracture of right fibula and tibia, sequela 07/15/2019  . Acute blood loss anemia   . AKI (acute kidney injury) (HCC)   . Fracture of tibial shaft, open 07/12/2019  . PAF (paroxysmal atrial fibrillation) (HCC)   . Hyperlipidemia   . GERD (gastroesophageal reflux disease)   . Hiatal hernia   . Arthritis   . Anxiety   . Chronic diastolic CHF (congestive heart failure) (HCC)   . Current use of long term  anticoagulation 01/31/2018  . Chronic diastolic heart failure (HCC) 01/31/2018  . A-fib (HCC) 01/16/2018  . CHF (congestive heart failure) (HCC) 01/14/2018  . Hyperkalemia 01/14/2018  . Iron deficiency anemia 10/01/2017  . Osteoarthritis 09/24/2016  . Essential hypertension   . Hyponatremia 06/18/2016  . Hepatic cirrhosis (HCC) 06/18/2016  . Hypokalemia 06/18/2016  . Metabolic syndrome 09/12/2015  . Obesity (BMI 30-39.9) 09/12/2015  . GAD (generalized anxiety disorder) 05/03/2015  . Varicose veins 06/24/2013  . Seasonal allergic rhinitis 04/30/2013  . Vitamin D deficiency 04/16/2013  . Hypothyroidism 04/16/2013  . Genu valgum (acquired) 04/16/2013    Past Surgical History:  Procedure Laterality Date  . CHOLECYSTECTOMY    . I & D EXTREMITY Right 07/12/2019   Procedure: Irrigation And Debridement Extremity;  Surgeon: Toni Arthurs, MD;  Location: [facility] OR;  Service: Orthopedics;  Laterality: Right;  . ORIF ANKLE FRACTURE Right 07/12/2019   Procedure: OPEN  AND REDUCTION INTERNAL FIXATION (ORIF) ANKLE FRACTURE;  Surgeon: Toni Arthurs, MD;  Location: [facility] OR;  Service: Orthopedics;  Laterality: Right;  . TUBAL LIGATION  1960     OB History   No obstetric history on file.     Family History  Problem Relation Age of Onset  . Heart disease Mother   . Stroke Father   . Heart disease Sister   . Heart disease Brother   . Heart disease Sister   .  Heart disease Sister   . Heart disease Brother   . Heart disease Brother   . Diabetes Daughter     Social History   Tobacco Use  . Smoking status: Never Smoker  . Smokeless tobacco: Former Systems developer    Types: Snuff  Substance Use Topics  . Alcohol use: No  . Drug use: No    Home Medications Prior to Admission medications   Medication Sig Start Date End Date Taking? Authorizing Provider  acetaminophen (TYLENOL) 325 MG tablet Take 1-2 tablets (325-650 mg total) by mouth every 4 (four) hours as needed for mild pain. 07/24/19   Love, Ivan Anchors, PA-C  apixaban (ELIQUIS) 5 MG TABS tablet Take 1 tablet (5 mg total) by mouth 2 (two) times daily. 02/09/19   Satira Sark, MD  Cholecalciferol (D3-1000) 1000 UNITS capsule Take 1,000 Units by mouth daily.     [provider]  citalopram (CELEXA) 20 MG tablet Take 1 tablet (20 mg total) by mouth daily. 10/19/19   Evelina Dun A, FNP  diclofenac sodium (VOLTAREN) 1 % GEL Apply 2 g topically 3 (three) times daily. 08/05/19   Love, Ivan Anchors, PA-C  diltiazem (CARDIZEM CD) 180 MG 24 hr capsule Take 1 capsule (180 mg total) by mouth 2 (two) times daily. 11/02/19   Evelina Dun A, FNP  docusate sodium (COLACE) 100 MG capsule Take 1 capsule (100 mg total) by mouth 2 (two) times daily. 08/05/19 08/04/20  Love, Ivan Anchors, PA-C  Ferrous Sulfate Dried (SLOW RELEASE IRON) 45 MG TBCR Take 45 mg by mouth 2 (two) times daily. 08/18/18   Sharion Balloon, FNP  furosemide (LASIX) 20 MG tablet Take 1 tablet (20 mg total) by mouth every other day. 02/09/19 08/18/19  Satira Sark, MD  levothyroxine (SYNTHROID, LEVOTHROID) 125 MCG tablet Take 125 mcg by mouth daily before breakfast.    [provider]  losartan (COZAAR) 100 MG tablet Take 1 tablet by mouth once daily 11/30/19   Satira Sark, MD  magnesium oxide (MAG-OX) 400 (241.3 MG) MG tablet Take 1 tablet (400 mg total) by mouth daily. 02/14/15   Evelina Dun A, FNP  metoprolol succinate (TOPROL XL) 25 MG 24 hr tablet Take 1 tablet (25 mg total) by mouth daily. 02/09/19   Satira Sark, MD  nystatin (MYCOSTATIN/NYSTOP) powder Apply topically 4 (four) times daily. 10/01/17   Evelina Dun A, FNP  oxyCODONE (OXY IR/ROXICODONE) 5 MG immediate release tablet Take 1 tablet (5 mg total) by mouth every 12 (twelve) hours as needed for severe pain. 08/05/19   Love, Ivan Anchors, PA-C  polyethylene glycol (MIRALAX / GLYCOLAX) 17 g packet Take 17 g by mouth daily. 08/05/19   Love, Ivan Anchors, PA-C  potassium chloride SA (K-DUR) 20 MEQ tablet Take 1  tablet by mouth once daily 08/31/19   Satira Sark, MD  senna-docusate (SENOKOT-S) 8.6-50 MG tablet Take 2 tablets by mouth 2 (two) times daily. Adjust it up and down to make sure that you have a good BM daily. 08/05/19   Love, Ivan Anchors, PA-C  thiamine (VITAMIN B-1) 100 MG tablet Take 100 mg by mouth daily.    [provider]    Allergies    Terazosin hcl and Keflex [cephalexin]  Review of Systems   Review of Systems All other systems negative except as documented in the HPI. All pertinent positives and negatives as reviewed in the HPI. Physical Exam Updated Vital Signs BP (!) 150/80   Pulse Marland Kitchen)  139   Temp 98.7 F (37.1 C) (Oral)   Resp (!) 27   Ht 5\' 7"  (1.702 m)   Wt 118.4 kg   SpO2 98%   BMI 40.88 kg/m   Physical Exam Vitals and nursing note reviewed.  Constitutional:      General: She is not in acute distress.    Appearance: She is well-developed.  HENT:     Head: Normocephalic and atraumatic.  Eyes:     Pupils: Pupils are equal, round, and reactive to light.  Cardiovascular:     Rate and Rhythm: Normal rate and regular rhythm.     Heart sounds: Normal heart sounds. No murmur. No friction rub. No gallop.   Pulmonary:     Effort: Pulmonary effort is normal. No respiratory distress.     Breath sounds: Wheezing and rhonchi present. No decreased breath sounds.  Abdominal:     General: Bowel sounds are normal. There is no distension.     Palpations: Abdomen is soft.     Tenderness: There is no abdominal tenderness.  Musculoskeletal:     Cervical back: Normal range of motion and neck supple.  Skin:    General: Skin is warm and dry.     Capillary Refill: Capillary refill takes less than 2 seconds.     Findings: No erythema or rash.  Neurological:     Mental Status: She is alert and oriented to person, place, and time.     Motor: No abnormal muscle tone.     Coordination: Coordination normal.  Psychiatric:        Behavior: Behavior normal.     ED  Results / Procedures / Treatments   Labs (all labs ordered are listed, but only abnormal results are displayed) Labs Reviewed  CBC WITH DIFFERENTIAL/PLATELET - Abnormal; Notable for the following components:      Result Value   RBC 2.86 (*)    Hemoglobin 9.3 (*)    HCT 29.8 (*)    MCV 104.2 (*)    Platelets 125 (*)    Neutro Abs 9.0 (*)    Lymphs Abs 0.3 (*)    All other components within normal limits  BASIC METABOLIC PANEL - Abnormal; Notable for the following components:   Sodium 120 (*)    Potassium 6.6 (*)    Chloride 91 (*)    CO2 21 (*)    Glucose, Bld 118 (*)    BUN 43 (*)    Creatinine, Ser 1.92 (*)    GFR calc non Af Amer 23 (*)    GFR calc Af Amer 26 (*)    All other components within normal limits  BRAIN NATRIURETIC PEPTIDE - Abnormal; Notable for the following components:   B Natriuretic Peptide 1,681.0 (*)    All other components within normal limits  RESPIRATORY PANEL BY RT PCR (FLU A&B, COVID)  URINALYSIS, ROUTINE W REFLEX MICROSCOPIC    EKG EKG Interpretation  Date/Time:  Wednesday January 20 2020 13:43:47 EST Ventricular Rate:  33 PR Interval:    QRS Duration: 142 QT Interval:  522 QTC Calculation: 387 R Axis:   -32 Text Interpretation: Atrial fibrillation with slow ventricular response Right bundle branch block Left ventricular hypertrophy Abnormal T, consider ischemia, lateral leads Confirmed by [email or web address] [phone]) on 01/20/2020 1:46:54 PM   Radiology DG Chest Portable 1 View  Result Date: 01/20/2020 CLINICAL DATA:  Shortness of breath. EXAM: PORTABLE CHEST 1 VIEW COMPARISON:  July 12, 2019. FINDINGS: Stable cardiomediastinal silhouette. Atherosclerosis of  thoracic aorta is noted. No pneumothorax or pleural effusion is noted. Hypoinflation of the lungs is noted with mild bibasilar subsegmental atelectasis. Bony thorax is unremarkable. IMPRESSION: Hypoinflation of the lungs with mild bibasilar subsegmental atelectasis. Electronically Signed   By:  Lupita Raider M.D.   On: 01/20/2020 14:30    Procedures Procedures (including critical care time)  Medications Ordered in ED Medications  calcium gluconate 1 g/ 50 mL sodium chloride IVPB (1,000 mg Intravenous New Bag/Given 01/20/20 1531)  dextrose 50 % solution 50 mL (has no administration in time range)  insulin aspart (novoLOG) injection 6 Units (has no administration in time range)  furosemide (LASIX) injection 40 mg (has no administration in time range)  sodium zirconium cyclosilicate (LOKELMA) packet 10 g (10 g Oral Given 01/20/20 1537)    ED Course  I have reviewed the triage vital signs and the nursing notes.  Pertinent labs & imaging results that were available during my care of the patient were reviewed by me and considered in my medical decision making (see chart for details).    MDM Rules/Calculators/A&P                      The patient will be admitted to the [facility] hospitalist service.  Patient has been otherwise stable except for her bradycardia.  Patient does have wheezing noted on examination and some rhonchi.  Waiting for her respiratory panel to return before starting any nebulized treatments here in the emergency department.  CRITICAL CARE Performed by: Jamesetta Orleans Jocelynne Duquette Total critical care time: 30 minutes Critical care time was exclusive of separately billable procedures and treating other patients. Critical care was necessary to treat or prevent imminent or life-threatening deterioration. Critical care was time spent personally by me on the following activities: development of treatment plan with patient and/or surrogate as well as nursing, discussions with consultants, evaluation of patient's response to treatment, examination of patient, obtaining history from patient or surrogate, ordering and performing treatments and interventions, ordering and review of laboratory studies, ordering and review of radiographic studies, pulse oximetry and re-evaluation of  patient's condition.   Patient was placed on IV calcium IV D50 and IV insulin.  She is also given Lokelma as well. Final Clinical Impression(s) / ED Diagnoses Final diagnoses:  None    Rx / DC Orders ED Discharge Orders    None       Charlestine Night, PA-C 01/20/20 1621    Sabas Sous, MD 01/21/20 [phone]

## 2020-01-20 NOTE — ED Triage Notes (Signed)
EMS reports pt c/o sob x 6 days.  Reports sob worse with exertion.  [name] o2 sat on room air was 92% when they arrived but decreased to 88% after moving around.  EMS put pt on o2 at 2liters and administered albuterol neb.  O2 sat 97% on 2liters.

## 2020-01-20 NOTE — ED Notes (Signed)
Date and time results received: 01/20/20 1509   Test: Potassium Critical Value: 6.6  Name of Provider Notified: Charlestine Night  Orders Received? Or Actions Taken?: No new orders given.

## 2020-01-20 NOTE — H&P (Signed)
Patient Demographics:    Katherine Tyler, is a 86 y.o. female  MRN: [id number]   DOB - 1929/10/31  Admit Date - 01/20/2020  Outpatient Primary MD for the patient is Sharion Balloon, FNP   Assessment & Plan:    Principal Problem:   Hyperkalemia Active Problems:   Acute wheezy bronchitis with Hypoxia   Acute respiratory failure with hypoxia (HCC)   Bradycardia / Afib with Slow VR in the setting of Hyperkalemia   Acute kidney injury superimposed on CKD III   A-fib ([place])   Current use of long term anticoagulation   Hypothyroidism   Hyponatremia   Essential hypertension   Chronic diastolic CHF (congestive heart failure) (HCC)   1)Hyperkalemia--- in the setting of AKI on CKD -potassium is 6.6, bradycardia noted with heart rate in the 30s -Patient was getting losartan and potassium supplements along with magnesium supplements PTA -Received hyperkalemia cocktail in the ED including calcium gluconate, glucose and insulin, holding off on albuterol nebs pending Covid test -Also received Lokelma and Kayexalate for severe and symptomatic hyperkalemia -Hold Losartan, hold potassium supplementation, hold magnesium supplementation  2)AKI----acute kidney injury on CKD stage - III, worsening renal function in the setting of dehydration with Lasix and losartan use creatinine on admission=1.92  , baseline creatinine = 1.1 (08/05/2019) , renally adjust medications, avoid nephrotoxic agents / dehydration  / hypotension -Hold losartan, hold Lasix -Hydrate gently  3)Social/Ethics-discussed with daughter Chrys Racer [phone], patient is a full code  4)HypoNatremia/Hypochloremia- Na is 120, chloride is 91,  -Sodium was 138 on 08/05/2019 and chloride was 105,  --iv NS solution as ordered,  -Hold Lasix  5)HFpEF--- patient with history  of chronic diastolic CHF (D3OIZ), echo from 01/15/18 with EF of 55 to 60% and grade 2 diastolic dysfunction -Elevated BNP noted, chest x-ray without CHF type findings, clinically does not appear volume overloaded, actually patient is currently clinically dehydrated -Hold Lasix, hold losartan and hydrate gently  6)Paroxysmal Atrial Fibrillation with slow ventricular rate--- bradycardia with heart rates in the 30s most likely secondary to hyperkalemia as well as concomitant use of Cardizem CD and Toprol-XL --Hold Cardizem CD, hold Toprol-XL due to bradycardia, echo pending, TSH pending, serum magnesium is 2.5  7)Hypothyroidism--- continue levothyroxine, repeat TSH pending  8)HTN--- BP is not at goal, hold Cardizem CD and Toprol-XL due to bradycardia -- may use IV Hydralazine 10 mg  Every 4 hours Prn for systolic blood pressure over 160 mmhg  9)Acute Hypoxic Respiratory Failure----?? secondary to Acute wheezy bronchitis --No tobacco use -Bronchodilators as ordered, mucolytics and steroids as ordered -Supplemental oxygen as ordered  10) chronic anemia of chronic disease--- prior anemia work-up was not consistent with iron deficiency, hemoglobin currently 9.3 which is slightly higher than patient's prior baseline -Patient has received transfusion in the past -Monitor closely and transfuse as clinically indicated  With History of - Reviewed by me  Past Medical History:  Diagnosis Date  . Anxiety   .  Arthritis   . Chronic diastolic CHF (congestive heart failure) (HCC)   . GERD (gastroesophageal reflux disease)   . Hiatal hernia   . Hyperkalemia   . Hyperlipidemia   . Hypertension   . Hypothyroidism   . Iron deficiency anemia   . Obesity   . PAF (paroxysmal atrial fibrillation) (HCC)    a. diagnosed in 01/2018. Started on Eliquis for anticoagulation.   . Vitamin D deficiency       Past Surgical History:  Procedure Laterality Date  . CHOLECYSTECTOMY    . I & D EXTREMITY Right  07/12/2019   Procedure: Irrigation And Debridement Extremity;  Surgeon: Toni Arthurs, MD;  Location: [facility] OR;  Service: Orthopedics;  Laterality: Right;  . ORIF ANKLE FRACTURE Right 07/12/2019   Procedure: OPEN  AND REDUCTION INTERNAL FIXATION (ORIF) ANKLE FRACTURE;  Surgeon: Toni Arthurs, MD;  Location: [facility] OR;  Service: Orthopedics;  Laterality: Right;  . TUBAL LIGATION  1960    Chief Complaint  Patient presents with  . Shortness of Breath      HPI:    Katherine Tyler  is a 86 y.o. female  With PAFib (dxed 01/2018) on apixaban for stroke prophylaxis, on Cardizem CD and Toprol Xl for rate control, as well h/o HTN, HLD, hypothyroidism, GERD and obesity who presents to the ED with complaint of worsening shortness of breath and dyspnea on exertion with significant wheezing over the last 4+ days but worse over the last couple days -By EMS family reports worsening respiratory symptoms over the last couple days --Additional history obtained from patient's daughter Rayfield Citizen [phone] -In the ED O2 sats was down to 87% on room air, came back up to 97% on 2 L  -No chest pains, no palpitations, no pleuritic symptoms, no increased leg swelling -Patient has been compliant with home meds including apixaban  -Denies fevers, denies productive cough, no vomiting or diarrhea  -In the ED patient is found to be borderline hypoxic, bradycardic with heart rate in the 30s and very wheezy  In ED-- -Patient was 6.6 with a bicarb of 21, sodium was 120, creatinine was up to 1.92, from a baseline of 1.1 chloride was 91 -CBC with a hemoglobin of 9.3 which is slightly higher than patient's prior baseline -Platelets down to 125 from a baseline around 200--no bleeding concerns -BNP elevated at 1681 however chest x-ray without acute findings, specifically no CHF type findings   Review of systems:    In addition to the HPI above,   A full Review of  Systems was done, all other systems reviewed are negative except as noted  above in HPI , .    Social History:  Reviewed by me    Social History   Tobacco Use  . Smoking status: Never Smoker  . Smokeless tobacco: Former Neurosurgeon    Types: Snuff  Substance Use Topics  . Alcohol use: No     Family History :  Reviewed by me    Family History  Problem Relation Age of Onset  . Heart disease Mother   . Stroke Father   . Heart disease Sister   . Heart disease Brother   . Heart disease Sister   . Heart disease Sister   . Heart disease Brother   . Heart disease Brother   . Diabetes Daughter      Home Medications:   Prior to Admission medications   Medication Sig Start Date End Date Taking? Authorizing Provider  acetaminophen (TYLENOL) 325 MG tablet  Take 1-2 tablets (325-650 mg total) by mouth every 4 (four) hours as needed for mild pain. 07/24/19  Yes Love, Evlyn Kanner, PA-C  apixaban (ELIQUIS) 5 MG TABS tablet Take 1 tablet (5 mg total) by mouth 2 (two) times daily. 02/09/19  Yes Jonelle Sidle, MD  Cholecalciferol (D3-1000) 1000 UNITS capsule Take 1,000 Units by mouth 2 (two) times daily.    Yes [provider]  citalopram (CELEXA) 20 MG tablet Take 1 tablet (20 mg total) by mouth daily. 10/19/19  Yes Hawks, Christy A, FNP  diltiazem (CARDIZEM CD) 180 MG 24 hr capsule Take 1 capsule (180 mg total) by mouth 2 (two) times daily. 11/02/19  Yes Hawks, Christy A, FNP  Ferrous Sulfate Dried (SLOW RELEASE IRON) 45 MG TBCR Take 45 mg by mouth 2 (two) times daily. 08/18/18  Yes Hawks, Christy A, FNP  furosemide (LASIX) 20 MG tablet Take 1 tablet (20 mg total) by mouth every other day. 02/09/19 01/20/20 Yes Jonelle Sidle, MD  levothyroxine (SYNTHROID, LEVOTHROID) 125 MCG tablet Take 125 mcg by mouth daily before breakfast.   Yes [provider]  losartan (COZAAR) 100 MG tablet Take 1 tablet by mouth once daily Patient taking differently: Take 100 mg by mouth every morning.  11/30/19  Yes Jonelle Sidle, MD  magnesium oxide (MAG-OX) 400 (241.3  MG) MG tablet Take 1 tablet (400 mg total) by mouth daily. Patient taking differently: Take 400 mg by mouth every morning.  02/14/15  Yes Hawks, Christy A, FNP  metoprolol succinate (TOPROL XL) 25 MG 24 hr tablet Take 1 tablet (25 mg total) by mouth daily. Patient taking differently: Take 25 mg by mouth every morning.  02/09/19  Yes Jonelle Sidle, MD  nystatin (MYCOSTATIN/NYSTOP) powder Apply topically 4 (four) times daily. Patient taking differently: Apply 1 application topically 4 (four) times daily as needed (for rash/skin irritation).  10/01/17  Yes Hawks, Christy A, FNP  polyethylene glycol (MIRALAX / GLYCOLAX) 17 g packet Take 17 g by mouth daily. Patient taking differently: Take 17 g by mouth daily as needed for mild constipation or moderate constipation.  08/05/19  Yes Love, Evlyn Kanner, PA-C  potassium chloride SA (K-DUR) 20 MEQ tablet Take 1 tablet by mouth once daily Patient taking differently: Take 20 mEq by mouth every morning.  08/31/19  Yes Jonelle Sidle, MD  senna-docusate (SENOKOT-S) 8.6-50 MG tablet Take 2 tablets by mouth 2 (two) times daily. Adjust it up and down to make sure that you have a good BM daily. Patient taking differently: Take 2 tablets by mouth daily as needed for mild constipation or moderate constipation.  08/05/19  Yes Love, Evlyn Kanner, PA-C  thiamine (VITAMIN B-1) 100 MG tablet Take 100 mg by mouth every morning.    Yes [provider]     Allergies:     Allergies  Allergen Reactions  . Terazosin Hcl Other (See Comments)    Muscle aches  . Keflex [Cephalexin] Other (See Comments)    Unknown reaction     Physical Exam:   Vitals  Blood pressure (!) 164/54, pulse (!) 36, temperature 98.7 F (37.1 C), temperature source Oral, resp. rate (!) 25, height 5\' 7"  (1.702 m), weight 118.4 kg, SpO2 99 %.  Physical Examination: General appearance - alert, well appearing, and in no distress Nose- [place] 2L/min Mental status - alert, oriented to person,  place, and time,  Eyes - sclera anicteric Neck - supple, no JVD elevation , Chest -significant wheezing  earlier, no rales, fair air movement  heart - S1 and S2 normal, irregularly irregular heart rate in the 30s Abdomen - soft, nontender, nondistended, increased truncal adiposity  neurological - screening mental status exam normal, neck supple without rigidity, cranial nerves II through XII intact, DTR's normal and symmetric,  generalized weakness without new focal deficits Extremities -chronic venous stasis type changes of the lower extremities,, intact peripheral pulses  Skin - warm, dry   Data Review:    CBC Recent Labs  Lab 01/20/20 1437  WBC 10.2  HGB 9.3*  HCT 29.8*  PLT 125*  MCV 104.2*  MCH 32.5  MCHC 31.2  RDW 13.9  LYMPHSABS 0.3*  MONOABS 0.8  EOSABS 0.0  BASOSABS 0.0   ------------------------------------------------------------------------------------------------------------------  Chemistries  Recent Labs  Lab 01/20/20 1437  NA 120*  K 6.6*  CL 91*  CO2 21*  GLUCOSE 118*  BUN 43*  CREATININE 1.92*  CALCIUM 9.5   ------------------------------------------------------------------------------------------------------------------ estimated creatinine clearance is 27 mL/min (A) (by C-G formula based on SCr of 1.92 mg/dL (H)). ------------------------------------------------------------------------------------------------------------------ No results for input(s): TSH, T4TOTAL, T3FREE, THYROIDAB in the last 72 hours.  Invalid input(s): FREET3   Coagulation profile No results for input(s): INR, PROTIME in the last 168 hours. ------------------------------------------------------------------------------------------------------------------- No results for input(s): DDIMER in the last 72 hours. -------------------------------------------------------------------------------------------------------------------  Cardiac Enzymes No results for input(s): CKMB,  TROPONINI, MYOGLOBIN in the last 168 hours.  Invalid input(s): CK ------------------------------------------------------------------------------------------------------------------    Component Value Date/Time   BNP 1,681.0 (H) 01/20/2020 1437   --------------------------------------------------------------------------------------------------------------  Urinalysis    Component Value Date/Time   COLORURINE YELLOW 01/14/2018 2040   APPEARANCEUR HAZY (A) 01/14/2018 2040   LABSPEC 1.006 01/14/2018 2040   PHURINE 5.0 01/14/2018 2040   GLUCOSEU NEGATIVE 01/14/2018 2040   HGBUR NEGATIVE 01/14/2018 2040   BILIRUBINUR NEGATIVE 01/14/2018 2040   KETONESUR NEGATIVE 01/14/2018 2040   PROTEINUR 100 (A) 01/14/2018 2040   UROBILINOGEN 1.0 03/08/2008 1015   NITRITE NEGATIVE 01/14/2018 2040   LEUKOCYTESUR TRACE (A) 01/14/2018 2040    ----------------------------------------------------------------------------------------------------------------   Imaging Results:    DG Chest Portable 1 View  Result Date: 01/20/2020 CLINICAL DATA:  Shortness of breath. EXAM: PORTABLE CHEST 1 VIEW COMPARISON:  July 12, 2019. FINDINGS: Stable cardiomediastinal silhouette. Atherosclerosis of thoracic aorta is noted. No pneumothorax or pleural effusion is noted. Hypoinflation of the lungs is noted with mild bibasilar subsegmental atelectasis. Bony thorax is unremarkable. IMPRESSION: Hypoinflation of the lungs with mild bibasilar subsegmental atelectasis. Electronically Signed   By: Lupita Raider M.D.   On: 01/20/2020 14:30    Radiological Exams on Admission: DG Chest Portable 1 View  Result Date: 01/20/2020 CLINICAL DATA:  Shortness of breath. EXAM: PORTABLE CHEST 1 VIEW COMPARISON:  July 12, 2019. FINDINGS: Stable cardiomediastinal silhouette. Atherosclerosis of thoracic aorta is noted. No pneumothorax or pleural effusion is noted. Hypoinflation of the lungs is noted with mild bibasilar subsegmental  atelectasis. Bony thorax is unremarkable. IMPRESSION: Hypoinflation of the lungs with mild bibasilar subsegmental atelectasis. Electronically Signed   By: Lupita Raider M.D.   On: 01/20/2020 14:30    DVT Prophylaxis - TEDs/Apixaban AM Labs Ordered, also please review Full Orders  Family Communication: Admission, patients condition and plan of care including tests being ordered have been discussed with the patient and daughter Rayfield Citizen [phone] who indicate understanding and agree with the plan   Code Status - Full Code  Likely DC to home possibly with home health when renal function and hyperkalemia improves  Condition  Seleta Rhymes M.D on 01/20/2020 at 5:07 PM Go to [email or web address] -  for contact info  [facility] - Office  [phone]

## 2020-01-21 ENCOUNTER — Other Ambulatory Visit: Payer: Self-pay

## 2020-01-21 ENCOUNTER — Observation Stay (HOSPITAL_BASED_OUTPATIENT_CLINIC_OR_DEPARTMENT_OTHER): Payer: Medicare Other

## 2020-01-21 DIAGNOSIS — D631 Anemia in chronic kidney disease: Secondary | ICD-10-CM | POA: Diagnosis present

## 2020-01-21 DIAGNOSIS — E559 Vitamin D deficiency, unspecified: Secondary | ICD-10-CM | POA: Diagnosis present

## 2020-01-21 DIAGNOSIS — I130 Hypertensive heart and chronic kidney disease with heart failure and stage 1 through stage 4 chronic kidney disease, or unspecified chronic kidney disease: Secondary | ICD-10-CM | POA: Diagnosis present

## 2020-01-21 DIAGNOSIS — K219 Gastro-esophageal reflux disease without esophagitis: Secondary | ICD-10-CM | POA: Diagnosis present

## 2020-01-21 DIAGNOSIS — E785 Hyperlipidemia, unspecified: Secondary | ICD-10-CM | POA: Diagnosis present

## 2020-01-21 DIAGNOSIS — I480 Paroxysmal atrial fibrillation: Secondary | ICD-10-CM | POA: Diagnosis present

## 2020-01-21 DIAGNOSIS — N179 Acute kidney failure, unspecified: Secondary | ICD-10-CM | POA: Diagnosis present

## 2020-01-21 DIAGNOSIS — I5032 Chronic diastolic (congestive) heart failure: Secondary | ICD-10-CM | POA: Diagnosis present

## 2020-01-21 DIAGNOSIS — F419 Anxiety disorder, unspecified: Secondary | ICD-10-CM | POA: Diagnosis present

## 2020-01-21 DIAGNOSIS — I340 Nonrheumatic mitral (valve) insufficiency: Secondary | ICD-10-CM | POA: Diagnosis not present

## 2020-01-21 DIAGNOSIS — Z7401 Bed confinement status: Secondary | ICD-10-CM | POA: Diagnosis not present

## 2020-01-21 DIAGNOSIS — I10 Essential (primary) hypertension: Secondary | ICD-10-CM | POA: Diagnosis not present

## 2020-01-21 DIAGNOSIS — R001 Bradycardia, unspecified: Secondary | ICD-10-CM | POA: Diagnosis present

## 2020-01-21 DIAGNOSIS — I361 Nonrheumatic tricuspid (valve) insufficiency: Secondary | ICD-10-CM

## 2020-01-21 DIAGNOSIS — Z6835 Body mass index (BMI) 35.0-35.9, adult: Secondary | ICD-10-CM | POA: Diagnosis not present

## 2020-01-21 DIAGNOSIS — E039 Hypothyroidism, unspecified: Secondary | ICD-10-CM | POA: Diagnosis present

## 2020-01-21 DIAGNOSIS — D509 Iron deficiency anemia, unspecified: Secondary | ICD-10-CM | POA: Diagnosis present

## 2020-01-21 DIAGNOSIS — R0902 Hypoxemia: Secondary | ICD-10-CM | POA: Diagnosis not present

## 2020-01-21 DIAGNOSIS — E669 Obesity, unspecified: Secondary | ICD-10-CM | POA: Diagnosis present

## 2020-01-21 DIAGNOSIS — J9601 Acute respiratory failure with hypoxia: Secondary | ICD-10-CM | POA: Diagnosis present

## 2020-01-21 DIAGNOSIS — Z20822 Contact with and (suspected) exposure to covid-19: Secondary | ICD-10-CM | POA: Diagnosis present

## 2020-01-21 DIAGNOSIS — M1990 Unspecified osteoarthritis, unspecified site: Secondary | ICD-10-CM | POA: Diagnosis present

## 2020-01-21 DIAGNOSIS — E860 Dehydration: Secondary | ICD-10-CM | POA: Diagnosis present

## 2020-01-21 DIAGNOSIS — E871 Hypo-osmolality and hyponatremia: Secondary | ICD-10-CM | POA: Diagnosis present

## 2020-01-21 DIAGNOSIS — R0602 Shortness of breath: Secondary | ICD-10-CM | POA: Diagnosis not present

## 2020-01-21 DIAGNOSIS — N1832 Chronic kidney disease, stage 3b: Secondary | ICD-10-CM | POA: Diagnosis present

## 2020-01-21 DIAGNOSIS — J209 Acute bronchitis, unspecified: Secondary | ICD-10-CM | POA: Diagnosis present

## 2020-01-21 DIAGNOSIS — E875 Hyperkalemia: Secondary | ICD-10-CM | POA: Diagnosis present

## 2020-01-21 DIAGNOSIS — E878 Other disorders of electrolyte and fluid balance, not elsewhere classified: Secondary | ICD-10-CM | POA: Diagnosis present

## 2020-01-21 DIAGNOSIS — Z23 Encounter for immunization: Secondary | ICD-10-CM | POA: Diagnosis not present

## 2020-01-21 LAB — GLUCOSE, CAPILLARY
Glucose-Capillary: 148 mg/dL — ABNORMAL HIGH (ref 70–99)
Glucose-Capillary: 135 mg/dL — ABNORMAL HIGH (ref 70–99)
Glucose-Capillary: 141 mg/dL — ABNORMAL HIGH (ref 70–99)
Glucose-Capillary: 155 mg/dL — ABNORMAL HIGH (ref 70–99)

## 2020-01-21 LAB — BASIC METABOLIC PANEL
Anion gap: 8 (ref 5–15)
BUN: 37 mg/dL — ABNORMAL HIGH (ref 8–23)
CO2: 24 mmol/L (ref 22–32)
Calcium: 9.3 mg/dL (ref 8.9–10.3)
Chloride: 94 mmol/L — ABNORMAL LOW (ref 98–111)
Creatinine, Ser: 1.5 mg/dL — ABNORMAL HIGH (ref 0.44–1.00)
GFR calc Af Amer: 36 mL/min — ABNORMAL LOW (ref >60)
GFR calc non Af Amer: 31 mL/min — ABNORMAL LOW (ref >60)
Glucose, Bld: 157 mg/dL — ABNORMAL HIGH (ref 70–99)
Potassium: 4.7 mmol/L (ref 3.5–5.1)
Sodium: 126 mmol/L — ABNORMAL LOW (ref 135–145)

## 2020-01-21 LAB — ECHOCARDIOGRAM COMPLETE
Height: 67 in
Weight: 3597.91 oz

## 2020-01-21 LAB — CBC
HCT: 26.5 % — ABNORMAL LOW (ref 36.0–46.0)
Hemoglobin: 8.4 g/dL — ABNORMAL LOW (ref 12.0–15.0)
MCH: 32.7 pg (ref 26.0–34.0)
MCHC: 31.7 g/dL (ref 30.0–36.0)
MCV: 103.1 fL — ABNORMAL HIGH (ref 80.0–100.0)
Platelets: 125 10*3/uL — ABNORMAL LOW (ref 150–400)
RBC: 2.57 MIL/uL — ABNORMAL LOW (ref 3.87–5.11)
RDW: 13.8 % (ref 11.5–15.5)
WBC: 4.8 10*3/uL (ref 4.0–10.5)
nRBC: 0 % (ref 0.0–0.2)

## 2020-01-21 MED ORDER — ALBUTEROL SULFATE (2.5 MG/3ML) 0.083% IN NEBU: 2.5000 mg | INHALATION_SOLUTION | RESPIRATORY_TRACT | Status: DC | PRN

## 2020-01-21 MED ORDER — APIXABAN 2.5 MG PO TABS
2.5000 mg | ORAL_TABLET | Freq: Two times a day (BID) | ORAL | Status: DC
  Administered 2020-01-21: 2.5 mg via ORAL
  Filled 2020-01-21: qty 1

## 2020-01-21 NOTE — Progress Notes (Signed)
*  PRELIMINARY RESULTS* Echocardiogram 2D Echocardiogram has been performed.  Katherine Tyler 01/21/2020, 10:57 AM

## 2020-01-21 NOTE — Progress Notes (Signed)
Patient Demographics:    Katherine IslamMary Tyler, is Tyler 86 y.o. female, DOB - 04/02/1929, XBJ:[id number]  Admit date - 01/20/2020   Admitting Physician Katherine Tyler Katherine ClontsEmokpae, MD  Outpatient Primary MD for the patient is Katherine Tyler, Katherine A, FNP  LOS - 0   Chief Complaint  Patient presents with  . Shortness of Breath        Subjective:    [facility] has no fevers, no emesis,  No chest pain,   -Dyspnea and wheezing improved significantly  Assessment  & Plan :    Principal Problem:   Hyperkalemia Active Problems:   Acute wheezy bronchitis with Hypoxia   Acute respiratory failure with hypoxia (HCC)   Bradycardia / Afib with Slow VR in the setting of Hyperkalemia   Acute kidney injury superimposed on CKD III   Tyler-fib (HCC)   Current use of long term anticoagulation   Hypothyroidism   Hyponatremia   Essential hypertension   Chronic diastolic CHF (congestive heart failure) (HCC)  Brief Summary - 86 y.o. female  With PAFib (dxed 01/2018) on apixaban for stroke prophylaxis, on Cardizem CD and Toprol Xl for rate control, as well h/o HTN, HLD, hypothyroidism, GERD and obesity admitted with dyspnea and symptomatic bradycardia in the setting of significant electrolyte abnormalities  Tyler/P 1)Hyperkalemia--- in the setting of AKI on CKD -potassium is 7 from 6.6, bradycardia has resolved -Patient was getting losartan and potassium supplements along with magnesium supplements PTA -Received hyperkalemia cocktail in the ED including calcium gluconate, glucose and insulin,  as well as albuterol nebs  -Also received Lokelma and Kayexalate for severe and symptomatic hyperkalemia -Continue to hold Losartan, hold potassium supplementation, hold magnesium supplementation  2)AKI----acute kidney injury on CKD stage - III, worsening renal function in the setting of dehydration with Lasix and losartan use - creatinine on admission=1.92  ,  baseline creatinine = 1.1 (08/05/2019) , creatinine is down to 1.50  --renally adjust medications, avoid nephrotoxic agents / dehydration  / hypotension -Hold losartan, hold Lasix -Hydrate gently  3)Social/Ethics-discussed with daughter Katherine Tyler [phone], patient is Tyler full code  4)HypoNatremia/Hypochloremia- Na is up to 126 from 120, chloride is up to 94 from 91,  -Sodium was 138 on 08/05/2019 and chloride was 105,  --iv NS solution as ordered,  -Hold Lasix  5)HFpEF--- patient with history of chronic diastolic CHF (d2CHF),  repeat echo with EF of 70 to 75% with  diastolic dysfunction -Elevated BNP noted, chest x-ray without CHF type findings, clinically does not appear volume overloaded, actually patient is currently clinically dehydrated -Hold Lasix, hold losartan -continue gentle IV hydration   6)Paroxysmal Atrial Fibrillation with slow ventricular rate--- bradycardia with heart rates in the 30s most likely secondary to hyperkalemia as well as concomitant use of Cardizem CD and Toprol-XL -Bradycardia resolved, patient appears to be in sinus rhythm at this time --Hold Cardizem CD, hold Toprol-XL due to bradycardia on presentation, echo pending,  TSH over 30 noted serum magnesium is 2.5  7)Hypothyroidism--- TSH is 32, increase levothyroxine to 175 mcg , repeat TSH in 4 weeks  8)HTN--- BP is not at goal, hold Cardizem CD and Toprol-XL due to bradycardia -- may use IV Hydralazine 10 mg  Every 4 hours Prn for systolic blood pressure over 160  mmhg  9)Acute Hypoxic Respiratory Failure----?? secondary to Acute wheezy bronchitis --No tobacco use -Overall much improved, continue Bronchodilators, mucolytics and steroids as ordered -Supplemental oxygen as ordered  10) chronic anemia of chronic disease--- prior anemia work-up was not consistent with iron deficiency, hemoglobin down to 8.4 from 9.3 with hydration, patient baseline usually around 9  -Patient has received transfusion in  the past -Monitor closely and transfuse as clinically indicated  Disposition/Need for in-Tyler Stay- patient unable to be discharged at this time due to --acute kidney injury with symptomatic electrolyte abnormalities requiring  IV fluids -Plan to discharge home when renal function electrolytes abnormalities improved  Code Status :   Family Communication:   (patient is alert, awake and coherent) Discussed with patient's daughter Katherine Tyler  Consults  :  na  DVT Prophylaxis  : Apixaban  Lab Results  Component Value Date   PLT 125 (L) 01/21/2020    Inpatient Medications  Scheduled Meds: . albuterol  2.5 mg Nebulization Q4H  . apixaban  2.5 mg Oral BID  . chlorhexidine  15 mL Mouth Rinse BID  . cholecalciferol  1,000 Units Oral BID  . citalopram  20 mg Oral Daily  . ferrous sulfate  325 mg Oral BID WC  . levothyroxine  175 mcg Oral Q0600  . mouth rinse  15 mL Mouth Rinse q12n4p  . methylPREDNISolone (SOLU-MEDROL) injection  40 mg Intravenous Q8H  . senna-docusate  2 tablet Oral BID  . sodium chloride flush  3 mL Intravenous Q12H  . thiamine  100 mg Oral q morning - 10a   Continuous Infusions: . sodium chloride    . sodium chloride 50 mL/hr at 01/21/20 1521  . albuterol 5 mg/hr (01/20/20 1823)  .  sodium bicarbonate  infusion 1000 mL 25 mL/hr at 01/20/20 1755   PRN Meds:.sodium chloride, acetaminophen **OR** acetaminophen, hydrALAZINE, ondansetron **OR** ondansetron (ZOFRAN) IV, polyethylene glycol, sodium chloride flush, traZODone    Anti-infectives (From admission, onward)   None        Objective:   Vitals:   01/21/20 0851 01/21/20 1254 01/21/20 1352 01/21/20 1656  BP:   (!) 151/53   Pulse:   81   Resp:   20   Temp:   98.3 F (36.8 C)   TempSrc:   Oral   SpO2: 98% 98% 100% 99%  Weight:      Height:        Wt Readings from Last 3 Encounters:  01/21/20 102 kg  08/18/19 110.7 kg  08/05/19 107.6 kg     Intake/Output Summary (Last 24 hours) at  01/21/2020 1825 Last data filed at 01/21/2020 1700 Gross per 24 hour  Intake 2086.61 ml  Output 1350 ml  Net 736.61 ml     Physical Exam  Gen:- Awake Alert, able to speak in sentences HEENT:- [place].AT, No sclera icterus Neck-Supple Neck,No JVD,.  Lungs-improving air movement bilaterally, no significant wheezing CV- S1, S2 normal, irregular but not irregularly irregular Abd-  +ve B.Sounds, Abd Soft, No tenderness,    Extremity/Skin:- No  edema, pedal pulses present  Psych-affect is appropriate, oriented x3 Neuro-generalized weakness, no new focal deficits, no tremors   Data Review:   Micro Results Recent Results (from the past 240 hour(s))  Respiratory Panel by RT PCR (Flu Tyler&B, Covid) - Nasopharyngeal Swab     Status: None   Collection Time: 01/20/20  3:04 PM   Specimen: Nasopharyngeal Swab  Result Value Ref Range Status   SARS Coronavirus 2 by RT PCR  NEGATIVE NEGATIVE Final    Comment: (NOTE) SARS-CoV-2 target nucleic acids are NOT DETECTED. The SARS-CoV-2 RNA is generally detectable in upper respiratoy specimens during the acute phase of infection. The lowest concentration of SARS-CoV-2 viral copies this assay can detect is 131 copies/mL. Tyler negative result does not preclude SARS-Cov-2 infection and should not be used as the sole basis for treatment or other patient management decisions. Tyler negative result may occur with  improper specimen collection/handling, submission of specimen other than nasopharyngeal swab, presence of viral mutation(s) within the areas targeted by this assay, and inadequate number of viral copies (<131 copies/mL). Tyler negative result must be combined with clinical observations, patient history, and epidemiological information. The expected result is Negative. Fact Sheet for Patients:  https://www.moore.com/ Fact Sheet for Healthcare Providers:  https://www.young.biz/ This test is not yet ap proved or cleared by the  [place] FDA and  has been authorized for detection and/or diagnosis of SARS-CoV-2 by FDA under an Emergency Use Authorization (EUA). This EUA will remain  in effect (meaning this test can be used) for the duration of the COVID-19 declaration under Section 564(b)(1) of the Act, 21 U.S.C. section 360bbb-3(b)(1), unless the authorization is terminated or revoked sooner.    Influenza Tyler by PCR NEGATIVE NEGATIVE Final   Influenza B by PCR NEGATIVE NEGATIVE Final    Comment: (NOTE) The Xpert Xpress SARS-CoV-2/FLU/RSV assay is intended as an aid in  the diagnosis of influenza from Nasopharyngeal swab specimens and  should not be used as Tyler sole basis for treatment. Nasal washings and  aspirates are unacceptable for Xpert Xpress SARS-CoV-2/FLU/RSV  testing. Fact Sheet for Patients: https://www.moore.com/ Fact Sheet for Healthcare Providers: https://www.young.biz/ This test is not yet approved or cleared by the [place] FDA and  has been authorized for detection and/or diagnosis of SARS-CoV-2 by  FDA under an Emergency Use Authorization (EUA). This EUA will remain  in effect (meaning this test can be used) for the duration of the  Covid-19 declaration under Section 564(b)(1) of the Act, 21  U.S.C. section 360bbb-3(b)(1), unless the authorization is  terminated or revoked. Performed at [facility], [address] [place], [place] [address]     Radiology Reports DG Chest Portable 1 View  Result Date: 01/20/2020 CLINICAL DATA:  Shortness of breath. EXAM: PORTABLE CHEST 1 VIEW COMPARISON:  July 12, 2019. FINDINGS: Stable cardiomediastinal silhouette. Atherosclerosis of thoracic aorta is noted. No pneumothorax or pleural effusion is noted. Hypoinflation of the lungs is noted with mild bibasilar subsegmental atelectasis. Bony thorax is unremarkable. IMPRESSION: Hypoinflation of the lungs with mild bibasilar subsegmental atelectasis. Electronically  Signed   By: Lupita Raider M.D.   On: 01/20/2020 14:30   ECHOCARDIOGRAM COMPLETE  Result Date: 01/21/2020    ECHOCARDIOGRAM REPORT   Patient Name:   AARVI STOTTS Date of Exam: 01/21/2020 Medical Rec #:  [id number]   Height:       67.0 in Accession #:    [id number]  Weight:       224.9 lb Date of Birth:  1929/03/15    BSA:          2.13 m Patient Age:    86 years    BP:           151/68 mmHg Patient Gender: F           HR:           84 bpm. [facility] Procedure: 2D Echo Indications:  Abnormal ECG 794.31 / R94.31  History:        Patient has prior history of Echocardiogram examinations, most                 recent 01/15/2018. CHF, Arrythmias:Atrial Fibrillation; Risk                 Factors:Non-Smoker, Dyslipidemia and Hypertension. GERD,                 Bradycardia / Afib with Slow VR.  Sonographer:    Jeryl Columbia RDCS (AE) Referring Phys: [id number] Madalena Kesecker IMPRESSIONS  1. Left ventricular ejection fraction, by estimation, is 70 to 75%. The left ventricle has normal function. The left ventrical has no regional wall motion abnormalities. There is mildly increased left ventricular hypertrophy. Left ventricular diastolic parameters are consistent with Grade I diastolic dysfunction (impaired relaxation).  2. Right ventricular systolic function is normal. The right ventricular size is normal. There is moderately elevated pulmonary artery systolic pressure.  3. Left atrial size was severely dilated.  4. Right atrial size was mildly dilated.  5. The mitral valve is normal in structure and function. Mild mitral valve regurgitation. No evidence of mitral stenosis.  6. The aortic valve is tricuspid. Aortic valve regurgitation is not visualized. No aortic stenosis is present.  7. There is moderate pulmonary hypertension, PASP is 53 mmHg.  8. The inferior vena cava is normal in size with greater than 50% respiratory variability, suggesting right atrial pressure of 3 mmHg. FINDINGS  Left Ventricle: Left  ventricular ejection fraction, by estimation, is 70 to 75%. The left ventricle has normal function. The left ventricle has no regional wall motion abnormalities. There is mildly increased left ventricular hypertrophy. Left ventricular diastolic parameters are consistent with Grade I diastolic dysfunction (impaired relaxation). Right Ventricle: The right ventricular size is normal. No increase in right ventricular wall thickness. Right ventricular systolic function is normal. There is moderately elevated pulmonary artery systolic pressure. The tricuspid regurgitant velocity is 3.52 m/s, and with an assumed right atrial pressure of 10 mmHg, the estimated right ventricular systolic pressure is 59.6 mmHg. Left Atrium: Left atrial size was severely dilated. Right Atrium: Right atrial size was mildly dilated. Pericardium: There is no evidence of pericardial effusion. Mitral Valve: The mitral valve is normal in structure and function. Mild mitral valve regurgitation. No evidence of mitral valve stenosis. Tricuspid Valve: The tricuspid valve is normal in structure. Tricuspid valve regurgitation is mild . No evidence of tricuspid stenosis. Aortic Valve: The aortic valve is tricuspid. Aortic valve regurgitation is not visualized. No aortic stenosis is present. Aortic valve mean gradient measures 9.2 mmHg. Aortic valve peak gradient measures 17.9 mmHg. Aortic valve area, by VTI measures 1.26  cm. Pulmonic Valve: The pulmonic valve was not well visualized. Pulmonic valve regurgitation is not visualized. No evidence of pulmonic stenosis. Aorta: The aortic root is normal in size and structure. Pulmonary Artery: There is moderate pulmonary hypertension, PASP is 53 mmHg. Venous: The inferior vena cava is normal in size with greater than 50% respiratory variability, suggesting right atrial pressure of 3 mmHg. IAS/Shunts: No atrial level shunt detected by color flow Doppler.  LEFT VENTRICLE PLAX 2D LVIDd:         5.30 cm  Diastology  LVIDs:         2.49 cm  LV e' lateral:   9.25 cm/s LV PW:         1.15 cm  LV E/e' lateral: 10.2 LV IVS:  1.03 cm  LV e' medial:    5.44 cm/s LVOT diam:     1.60 cm  LV E/e' medial:  17.4 LV SV:         55.25 ml LV SV Index:   50.64 LVOT Area:     2.01 cm  RIGHT VENTRICLE RV S prime:     21.30 cm/s TAPSE (M-mode): 2.4 cm LEFT ATRIUM              Index       RIGHT ATRIUM           Index LA diam:        4.40 cm  2.07 cm/m  RA Area:     17.70 cm LA Vol (A2C):   111.0 ml 52.22 ml/m RA Volume:   45.40 ml  21.36 ml/m LA Vol (A4C):   99.8 ml  46.95 ml/m LA Biplane Vol: 109.0 ml 51.28 ml/m  AORTIC VALVE AV Area (Vmax):    1.17 cm AV Area (Vmean):   1.18 cm AV Area (VTI):     1.26 cm AV Vmax:           211.75 cm/s AV Vmean:          144.560 cm/s AV VTI:            0.438 m AV Peak Grad:      17.9 mmHg AV Mean Grad:      9.2 mmHg LVOT Vmax:         123.69 cm/s LVOT Vmean:        84.834 cm/s LVOT VTI:          0.275 m LVOT/AV VTI ratio: 0.63  AORTA Ao Root diam: 3.20 cm MITRAL VALVE                         TRICUSPID VALVE MV Area (PHT): 2.76 cm              TR Peak grad:   49.6 mmHg MV Decel Time: 275 msec              TR Vmax:        352.00 cm/s MV E velocity: 94.60 cm/s  103 cm/s MV Tyler velocity: 116.00 cm/s 70.3 cm/s SHUNTS MV E/Tyler ratio:  0.82        1.5       Systemic VTI:  0.27 m                                      Systemic Diam: 1.60 cm Dina Rich MD Electronically signed by Dina Rich MD Signature Date/Time: 01/21/2020/11:41:25 AM    Final      CBC Recent Labs  Lab 01/20/20 1437 01/21/20 0540  WBC 10.2 4.8  HGB 9.3* 8.4*  HCT 29.8* 26.5*  PLT 125* 125*  MCV 104.2* 103.1*  MCH 32.5 32.7  MCHC 31.2 31.7  RDW 13.9 13.8  LYMPHSABS 0.3*  --   MONOABS 0.8  --   EOSABS 0.0  --   BASOSABS 0.0  --     Chemistries  Recent Labs  Lab 01/20/20 1437 01/20/20 2008 01/21/20 0540  NA 121*  120* 123* 126*  K 6.6*  6.6* 5.3* 4.7  CL 90*  91* 91* 94*  CO2 22  21* 21* 24  GLUCOSE  119*  118* 147* 157*  BUN 42*  43* 43* 37*  CREATININE 1.93*  1.92* 1.83* 1.50*  CALCIUM 9.6  9.5 9.7 9.3  MG 2.5*  --   --    ------------------------------------------------------------------------------------------------------------------ No results for input(s): CHOL, HDL, LDLCALC, TRIG, CHOLHDL, LDLDIRECT in the last 72 hours.  Lab Results  Component Value Date   HGBA1C 5.3 09/12/2015   ------------------------------------------------------------------------------------------------------------------ Recent Labs    01/20/20 2008  TSH 32.620*   ------------------------------------------------------------------------------------------------------------------ No results for input(s): VITAMINB12, FOLATE, FERRITIN, TIBC, IRON, RETICCTPCT in the last 72 hours.  Coagulation profile No results for input(s): INR, PROTIME in the last 168 hours.  No results for input(s): DDIMER in the last 72 hours.  Cardiac Enzymes No results for input(s): CKMB, TROPONINI, MYOGLOBIN in the last 168 hours.  Invalid input(s): CK ------------------------------------------------------------------------------------------------------------------    Component Value Date/Time   BNP 1,681.0 (H) 01/20/2020 1437     Shon Hale M.D on 01/21/2020 at 6:25 PM  Go to [email or web address] - for contact info  [facility] - Office  [phone]

## 2020-01-22 LAB — GLUCOSE, CAPILLARY
Glucose-Capillary: 132 mg/dL — ABNORMAL HIGH (ref 70–99)
Glucose-Capillary: 126 mg/dL — ABNORMAL HIGH (ref 70–99)
Glucose-Capillary: 109 mg/dL — ABNORMAL HIGH (ref 70–99)
Glucose-Capillary: 122 mg/dL — ABNORMAL HIGH (ref 70–99)

## 2020-01-22 LAB — RENAL FUNCTION PANEL
Albumin: 3.2 g/dL — ABNORMAL LOW (ref 3.5–5.0)
Anion gap: 7 (ref 5–15)
BUN: 35 mg/dL — ABNORMAL HIGH (ref 8–23)
CO2: 24 mmol/L (ref 22–32)
Calcium: 8.8 mg/dL — ABNORMAL LOW (ref 8.9–10.3)
Chloride: 97 mmol/L — ABNORMAL LOW (ref 98–111)
Creatinine, Ser: 1.27 mg/dL — ABNORMAL HIGH (ref 0.44–1.00)
GFR calc Af Amer: 44 mL/min — ABNORMAL LOW (ref >60)
GFR calc non Af Amer: 38 mL/min — ABNORMAL LOW (ref >60)
Glucose, Bld: 144 mg/dL — ABNORMAL HIGH (ref 70–99)
Phosphorus: 3.8 mg/dL (ref 2.5–4.6)
Potassium: 4.6 mmol/L (ref 3.5–5.1)
Sodium: 128 mmol/L — ABNORMAL LOW (ref 135–145)

## 2020-01-22 MED ORDER — METHYLPREDNISOLONE SODIUM SUCC 40 MG IJ SOLR
40.0000 mg | Freq: Two times a day (BID) | INTRAMUSCULAR | Status: DC
  Administered 2020-01-22 – 2020-01-23 (×2): 40 mg via INTRAVENOUS
  Filled 2020-01-22 (×2): qty 1

## 2020-01-22 MED ORDER — LOSARTAN POTASSIUM 50 MG PO TABS
25.0000 mg | ORAL_TABLET | Freq: Every day | ORAL | Status: DC
  Administered 2020-01-22 – 2020-01-23 (×2): 25 mg via ORAL
  Filled 2020-01-22 (×2): qty 1

## 2020-01-22 MED ORDER — APIXABAN 5 MG PO TABS
5.0000 mg | ORAL_TABLET | Freq: Two times a day (BID) | ORAL | Status: DC
  Administered 2020-01-22 – 2020-01-23 (×3): 5 mg via ORAL
  Filled 2020-01-22 (×3): qty 1

## 2020-01-22 NOTE — TOC Initial Note (Signed)
Transition of Care [facility]) - Initial/Assessment Note   Patient Details  Name: Katherine Tyler MRN: [id number] Date of Birth: [birth year]  Transition of [facility]) CM/SW Contact:    Ewing Schlein, LCSW Phone Number: 01/22/2020, 3:13 PM  Clinical Narrative: TOC completed an initial assessment. Discussed needing PT through [facility] with patient. Spoke with Bonita Quin with at [facility] for referral as patient will need PT 3x's/weekly. Patient currently on O2. TOC to follow for home oxygen.             Expected Discharge Plan: Home w Home Health Services Barriers to Discharge: Continued Medical Work up  Patient Goals and CMS Choice Patient states their goals for this hospitalization and ongoing recovery are:: To discharge home CMS Medicare.gov Compare Post Acute Care list provided to:: Patient Choice offered to / list presented to : Patient  Expected Discharge Plan and Services Expected Discharge Plan: Home w Home Health Services   Post Acute Care Choice: Home Health Living arrangements for the past 2 months: Single Family Home                 HH Arranged: PT [facility] Date HH Agency Contacted: 01/22/20 Time HH Agency Contacted: 1503 Representative spoke with at [facility] Agency: Alroy Bailiff  Prior Living Arrangements/Services Living arrangements for the past 2 months: Single Family Home Lives with:: Adult Children Patient language and need for interpreter reviewed:: Yes Do you feel safe going back to the place where you live?: Yes      Need for Family Participation in Patient Care: Yes (Comment) Care giver support system in place?: Yes (comment)   Criminal Activity/Legal Involvement Pertinent to Current Situation/Hospitalization: No - Comment as needed  Activities of Daily Living Home Assistive Devices/Equipment: Eyeglasses, CBG Meter, Dentures (specify type), Walker (specify type), Oxygen, Nebulizer(front wheel walker) ADL Screening (condition at time of  admission) Patient's cognitive ability adequate to safely complete daily activities?: Yes Is the patient deaf or have difficulty hearing?: Yes Does the patient have difficulty seeing, even when wearing glasses/contacts?: No Does the patient have difficulty concentrating, remembering, or making decisions?: Yes Patient able to express need for assistance with ADLs?: Yes Does the patient have difficulty dressing or bathing?: Yes Independently performs ADLs?: No Communication: Independent Dressing (OT): Needs assistance Is this a change from baseline?: Pre-admission baseline Grooming: Needs assistance Is this a change from baseline?: Pre-admission baseline Feeding: Independent Bathing: Needs assistance Is this a change from baseline?: Pre-admission baseline Toileting: Needs assistance Is this a change from baseline?: Pre-admission baseline In/Out Bed: Needs assistance Is this a change from baseline?: Pre-admission baseline Walks in Home: Needs assistance Is this a change from baseline?: Pre-admission baseline Does the patient have difficulty walking or climbing stairs?: Yes Weakness of Legs: Right Weakness of Arms/Hands: None  Permission Sought/Granted    Emotional Assessment   Attitude/Demeanor/Rapport: Engaged Affect (typically observed): Accepting Orientation: : Oriented to Self, Oriented to Place, Oriented to  Time, Oriented to Situation   Psych Involvement: No (comment)  Admission diagnosis:  Hyperkalemia [E87.5] Patient Active Problem List   Diagnosis Date Noted  . Acute wheezy bronchitis with Hypoxia 01/20/2020  . Acute respiratory failure with hypoxia (HCC) 01/20/2020  . Bradycardia / Afib with Slow VR in the setting of Hyperkalemia 01/20/2020  . Acute kidney injury superimposed on CKD III 01/20/2020  . Labile blood pressure   . Closed fracture of right fibula and tibia, sequela 07/15/2019  . Acute blood loss anemia   .  AKI (acute kidney injury) ([place])   . Fracture  of tibial shaft, open 07/12/2019  . PAF (paroxysmal atrial fibrillation) ([place])   . Hyperlipidemia   . GERD (gastroesophageal reflux disease)   . Hiatal hernia   . Arthritis   . Anxiety   . Chronic diastolic CHF (congestive heart failure) ([place])   . Current use of long term anticoagulation 01/31/2018  . Chronic diastolic heart failure ([place]) 01/31/2018  . A-fib ([place]) 01/16/2018  . CHF (congestive heart failure) ([place]) 01/14/2018  . Hyperkalemia 01/14/2018  . Iron deficiency anemia 10/01/2017  . Osteoarthritis 09/24/2016  . Essential hypertension   . Hyponatremia 06/18/2016  . Hepatic cirrhosis ([place]) 06/18/2016  . Hypokalemia 06/18/2016  . Metabolic syndrome 97/35/3299  . Obesity (BMI 30-39.9) 09/12/2015  . GAD (generalized anxiety disorder) 05/03/2015  . Varicose veins 06/24/2013  . Seasonal allergic rhinitis 04/30/2013  . Vitamin D deficiency 04/16/2013  . Hypothyroidism 04/16/2013  . Genu valgum (acquired) 04/16/2013   PCP:  Sharion Balloon, FNP Pharmacy:   [facility] [address], [place] [address] [place] [address] Phone: [phone] Fax: [phone]  [id number] - [place], [place] [address] Phone: [phone] Fax: [phone]  Readmission Risk Interventions No flowsheet data found.

## 2020-01-22 NOTE — Progress Notes (Signed)
Patient Demographics:    Katherine Tyler, is a 86 y.o. female, DOB - 1929-10-31, XQJ:[id number]  Admit date - 01/20/2020   Admitting Physician Benford Asch Mariea Clonts, MD  Outpatient Primary MD for the patient is Junie Spencer, FNP  LOS - 1  Chief Complaint  Patient presents with  . Shortness of Breath       Subjective:    [facility] today has no fevers, no emesis,  No chest pain,   -Dyspnea and wheezing improved significantly  Assessment  & Plan :    Principal Problem:   Hyperkalemia Active Problems:   Acute wheezy bronchitis with Hypoxia   Acute respiratory failure with hypoxia (HCC)   Bradycardia / Afib with Slow VR in the setting of Hyperkalemia   Acute kidney injury superimposed on CKD III   A-fib (HCC)   Current use of long term anticoagulation   Hypothyroidism   Hyponatremia   Essential hypertension   Chronic diastolic CHF (congestive heart failure) (HCC)  Brief Summary - 86 y.o. female  With PAFib (dxed 01/2018) on apixaban for stroke prophylaxis, on Cardizem CD and Toprol Xl for rate control, as well h/o HTN, HLD, hypothyroidism, GERD and obesity admitted with dyspnea and symptomatic bradycardia in the setting of significant electrolyte abnormalities  A/P 1)Hyperkalemia--- in the setting of AKI on CKD -potassium is 7 from 6.6, bradycardia has resolved -Patient was getting losartan 100mg  and potassium supplements along with magnesium supplements PTA -Received hyperkalemia cocktail in the ED including calcium gluconate, glucose and insulin,  as well as albuterol nebs  -Also received Lokelma and Kayexalate for severe and symptomatic hyperkalemia -c/n  hold potassium supplementation, hold magnesium supplementation   2)AKI----acute kidney injury on CKD stage - III, worsening renal function in the setting of dehydration with Lasix and losartan use - creatinine on admission=1.92  , baseline creatinine  = 1.1 (08/05/2019) , creatinine is down to 1. 270  --renally adjust medications, avoid nephrotoxic agents / dehydration  / hypotension -hold Lasix -Hydrate gently  3)Social/Ethics-discussed with daughter 08/07/2019 [phone], patient is a full code  4)HypoNatremia/Hypochloremia- Na is up to 128 from 120, chloride is up to 94 from 91,  -Sodium was 138 on 08/05/2019 and chloride was 105,  --iv NS solution as ordered,  -Hold Lasix  5)HFpEF--- patient with history of chronic diastolic CHF (d2CHF),  repeat echo with EF of 70 to 75% with  diastolic dysfunction -Elevated BNP noted, chest x-ray without CHF type findings, clinically does not appear volume overloaded, actually patient is currently clinically dehydrated -Hold Lasix, hold losartan -continue gentle IV hydration   6)Paroxysmal Atrial Fibrillation with slow ventricular rate--- bradycardia with heart rates in the 30s most likely secondary to hyperkalemia as well as concomitant use of Cardizem CD and Toprol-XL -Bradycardia resolved, patient appears to be in sinus rhythm at this time --Hold Cardizem CD, hold Toprol-XL due to bradycardia on presentation, echo pending,  TSH over 30 noted serum magnesium is 2.5  7)Hypothyroidism--- TSH is 32, increase levothyroxine to 175 mcg , repeat TSH in 4 weeks  8)HTN--- BP is not at goal, on admission we held Cardizem CD and Toprol-XL due to bradycardia -BP trending up, so restarted Toprol-XL at 25 mg daily, --ok to Restart Losartan 25 mg instead of  100mg , -- may use IV Hydralazine 10 mg  Every 4 hours Prn for systolic blood pressure over 160 mmhg  9)Acute Hypoxic Respiratory Failure----?? secondary to Acute wheezy bronchitis --No tobacco use -Overall much improved, continue Bronchodilators, mucolytics and steroids as ordered -Supplemental oxygen as ordered  10) chronic anemia of chronic disease--- prior anemia work-up was not consistent with iron deficiency, hemoglobin down to 8.4 from 9.3  with hydration, patient baseline usually around 9  -Patient has received transfusion in the past -Monitor closely and transfuse as clinically indicated  Disposition/Need for in-Hospital Stay- patient unable to be discharged at this time due to --acute kidney injury with symptomatic electrolyte abnormalities requiring  IV fluids --Possible discharge home with home health on 01/23/2020 if continues to improve  Code Status :   Family Communication:   (patient is alert, awake and coherent) Discussed with patient's daughter 01/25/2020, also called and discussed with daughter Eber Jones  Consults  :  na  DVT Prophylaxis  : Apixaban  Lab Results  Component Value Date   PLT 125 (L) 01/21/2020    Inpatient Medications  Scheduled Meds: . apixaban  5 mg Oral BID  . chlorhexidine  15 mL Mouth Rinse BID  . cholecalciferol  1,000 Units Oral BID  . citalopram  20 mg Oral Daily  . ferrous sulfate  325 mg Oral BID WC  . levothyroxine  175 mcg Oral Q0600  . losartan  25 mg Oral Daily  . mouth rinse  15 mL Mouth Rinse q12n4p  . methylPREDNISolone (SOLU-MEDROL) injection  40 mg Intravenous Q12H  . senna-docusate  2 tablet Oral BID  . sodium chloride flush  3 mL Intravenous Q12H  . thiamine  100 mg Oral q morning - 10a   Continuous Infusions: . sodium chloride    . sodium chloride 50 mL/hr at 01/21/20 1521  .  sodium bicarbonate  infusion 1000 mL 25 mL/hr at 01/20/20 1755   PRN Meds:.sodium chloride, acetaminophen **OR** acetaminophen, albuterol, hydrALAZINE, ondansetron **OR** ondansetron (ZOFRAN) IV, polyethylene glycol, sodium chloride flush, traZODone    Anti-infectives (From admission, onward)   None        Objective:   Vitals:   01/21/20 2050 01/21/20 2109 01/22/20 0431 01/22/20 1710  BP:  (!) 158/80 (!) 121/93 (!) 155/68  Pulse:  97 100 90  Resp:   20 20  Temp:  97.7 F (36.5 C) 97.6 F (36.4 C) 98.5 F (36.9 C)  TempSrc:  Oral Oral   SpO2: 98% 100% 100% 100%  Weight:    102.7 kg   Height:        Wt Readings from Last 3 Encounters:  01/22/20 102.7 kg  08/18/19 110.7 kg  08/05/19 107.6 kg     Intake/Output Summary (Last 24 hours) at 01/22/2020 1845 Last data filed at 01/22/2020 1700 Gross per 24 hour  Intake 920 ml  Output 1650 ml  Net -730 ml     Physical Exam  Gen:- Awake Alert, able to speak in sentences HEENT:- [place].AT, No sclera icterus Neck-Supple Neck,No JVD,.  Lungs-improving air movement bilaterally, no significant wheezing CV- S1, S2 normal, irregular but not irregularly irregular Abd-  +ve B.Sounds, Abd Soft, No tenderness,    Extremity/Skin:- No  edema, pedal pulses present  Psych-affect is appropriate, oriented x3 Neuro-generalized weakness, no new focal deficits, no tremors   Data Review:   Micro Results Recent Results (from the past 240 hour(s))  Respiratory Panel by RT PCR (Flu A&B, Covid) - Nasopharyngeal Swab  Status: None   Collection Time: 01/20/20  3:04 PM   Specimen: Nasopharyngeal Swab  Result Value Ref Range Status   SARS Coronavirus 2 by RT PCR NEGATIVE NEGATIVE Final    Comment: (NOTE) SARS-CoV-2 target nucleic acids are NOT DETECTED. The SARS-CoV-2 RNA is generally detectable in upper respiratoy specimens during the acute phase of infection. The lowest concentration of SARS-CoV-2 viral copies this assay can detect is 131 copies/mL. A negative result does not preclude SARS-Cov-2 infection and should not be used as the sole basis for treatment or other patient management decisions. A negative result may occur with  improper specimen collection/handling, submission of specimen other than nasopharyngeal swab, presence of viral mutation(s) within the areas targeted by this assay, and inadequate number of viral copies (<131 copies/mL). A negative result must be combined with clinical observations, patient history, and epidemiological information. The expected result is Negative. Fact Sheet for Patients:    https://www.moore.com/ Fact Sheet for Healthcare Providers:  https://www.young.biz/ This test is not yet ap proved or cleared by the [place] FDA and  has been authorized for detection and/or diagnosis of SARS-CoV-2 by FDA under an Emergency Use Authorization (EUA). This EUA will remain  in effect (meaning this test can be used) for the duration of the COVID-19 declaration under Section 564(b)(1) of the Act, 21 U.S.C. section 360bbb-3(b)(1), unless the authorization is terminated or revoked sooner.    Influenza A by PCR NEGATIVE NEGATIVE Final   Influenza B by PCR NEGATIVE NEGATIVE Final    Comment: (NOTE) The Xpert Xpress SARS-CoV-2/FLU/RSV assay is intended as an aid in  the diagnosis of influenza from Nasopharyngeal swab specimens and  should not be used as a sole basis for treatment. Nasal washings and  aspirates are unacceptable for Xpert Xpress SARS-CoV-2/FLU/RSV  testing. Fact Sheet for Patients: https://www.moore.com/ Fact Sheet for Healthcare Providers: https://www.young.biz/ This test is not yet approved or cleared by the [place] FDA and  has been authorized for detection and/or diagnosis of SARS-CoV-2 by  FDA under an Emergency Use Authorization (EUA). This EUA will remain  in effect (meaning this test can be used) for the duration of the  Covid-19 declaration under Section 564(b)(1) of the Act, 21  U.S.C. section 360bbb-3(b)(1), unless the authorization is  terminated or revoked. Performed at [facility], [address] [place], [place] [address]     Radiology Reports DG Chest Portable 1 View  Result Date: 01/20/2020 CLINICAL DATA:  Shortness of breath. EXAM: PORTABLE CHEST 1 VIEW COMPARISON:  July 12, 2019. FINDINGS: Stable cardiomediastinal silhouette. Atherosclerosis of thoracic aorta is noted. No pneumothorax or pleural effusion is noted. Hypoinflation of the lungs is noted  with mild bibasilar subsegmental atelectasis. Bony thorax is unremarkable. IMPRESSION: Hypoinflation of the lungs with mild bibasilar subsegmental atelectasis. Electronically Signed   By: Lupita Raider M.D.   On: 01/20/2020 14:30   ECHOCARDIOGRAM COMPLETE  Result Date: 01/21/2020    ECHOCARDIOGRAM REPORT   Patient Name:   Katherine Tyler Date of Exam: 01/21/2020 Medical Rec #:  [id number]   Height:       67.0 in Accession #:    [id number]  Weight:       224.9 lb Date of Birth:  [birth year]    BSA:          2.13 m Patient Age:    89 years    BP:           151/68 mmHg Patient Gender: F  HR:           84 bpm. [facility] Procedure: 2D Echo Indications:    Abnormal ECG 794.31 / R94.31  History:        Patient has prior history of Echocardiogram examinations, most                 recent 01/15/2018. CHF, Arrythmias:Atrial Fibrillation; Risk                 Factors:Non-Smoker, Dyslipidemia and Hypertension. GERD,                 Bradycardia / Afib with Slow VR.  Sonographer:    Jeryl ColumbiaJohanna Elliott RDCS (AE) Referring Phys: [id number] Aleathia Purdy IMPRESSIONS  1. Left ventricular ejection fraction, by estimation, is 70 to 75%. The left ventricle has normal function. The left ventrical has no regional wall motion abnormalities. There is mildly increased left ventricular hypertrophy. Left ventricular diastolic parameters are consistent with Grade I diastolic dysfunction (impaired relaxation).  2. Right ventricular systolic function is normal. The right ventricular size is normal. There is moderately elevated pulmonary artery systolic pressure.  3. Left atrial size was severely dilated.  4. Right atrial size was mildly dilated.  5. The mitral valve is normal in structure and function. Mild mitral valve regurgitation. No evidence of mitral stenosis.  6. The aortic valve is tricuspid. Aortic valve regurgitation is not visualized. No aortic stenosis is present.  7. There is moderate pulmonary hypertension, PASP is  53 mmHg.  8. The inferior vena cava is normal in size with greater than 50% respiratory variability, suggesting right atrial pressure of 3 mmHg. FINDINGS  Left Ventricle: Left ventricular ejection fraction, by estimation, is 70 to 75%. The left ventricle has normal function. The left ventricle has no regional wall motion abnormalities. There is mildly increased left ventricular hypertrophy. Left ventricular diastolic parameters are consistent with Grade I diastolic dysfunction (impaired relaxation). Right Ventricle: The right ventricular size is normal. No increase in right ventricular wall thickness. Right ventricular systolic function is normal. There is moderately elevated pulmonary artery systolic pressure. The tricuspid regurgitant velocity is 3.52 m/s, and with an assumed right atrial pressure of 10 mmHg, the estimated right ventricular systolic pressure is 59.6 mmHg. Left Atrium: Left atrial size was severely dilated. Right Atrium: Right atrial size was mildly dilated. Pericardium: There is no evidence of pericardial effusion. Mitral Valve: The mitral valve is normal in structure and function. Mild mitral valve regurgitation. No evidence of mitral valve stenosis. Tricuspid Valve: The tricuspid valve is normal in structure. Tricuspid valve regurgitation is mild . No evidence of tricuspid stenosis. Aortic Valve: The aortic valve is tricuspid. Aortic valve regurgitation is not visualized. No aortic stenosis is present. Aortic valve mean gradient measures 9.2 mmHg. Aortic valve peak gradient measures 17.9 mmHg. Aortic valve area, by VTI measures 1.26  cm. Pulmonic Valve: The pulmonic valve was not well visualized. Pulmonic valve regurgitation is not visualized. No evidence of pulmonic stenosis. Aorta: The aortic root is normal in size and structure. Pulmonary Artery: There is moderate pulmonary hypertension, PASP is 53 mmHg. Venous: The inferior vena cava is normal in size with greater than 50% respiratory  variability, suggesting right atrial pressure of 3 mmHg. IAS/Shunts: No atrial level shunt detected by color flow Doppler.  LEFT VENTRICLE PLAX 2D LVIDd:         5.30 cm  Diastology LVIDs:         2.49 cm  LV  e' lateral:   9.25 cm/s LV PW:         1.15 cm  LV E/e' lateral: 10.2 LV IVS:        1.03 cm  LV e' medial:    5.44 cm/s LVOT diam:     1.60 cm  LV E/e' medial:  17.4 LV SV:         55.25 ml LV SV Index:   50.64 LVOT Area:     2.01 cm  RIGHT VENTRICLE RV S prime:     21.30 cm/s TAPSE (M-mode): 2.4 cm LEFT ATRIUM              Index       RIGHT ATRIUM           Index LA diam:        4.40 cm  2.07 cm/m  RA Area:     17.70 cm LA Vol (A2C):   111.0 ml 52.22 ml/m RA Volume:   45.40 ml  21.36 ml/m LA Vol (A4C):   99.8 ml  46.95 ml/m LA Biplane Vol: 109.0 ml 51.28 ml/m  AORTIC VALVE AV Area (Vmax):    1.17 cm AV Area (Vmean):   1.18 cm AV Area (VTI):     1.26 cm AV Vmax:           211.75 cm/s AV Vmean:          144.560 cm/s AV VTI:            0.438 m AV Peak Grad:      17.9 mmHg AV Mean Grad:      9.2 mmHg LVOT Vmax:         123.69 cm/s LVOT Vmean:        84.834 cm/s LVOT VTI:          0.275 m LVOT/AV VTI ratio: 0.63  AORTA Ao Root diam: 3.20 cm MITRAL VALVE                         TRICUSPID VALVE MV Area (PHT): 2.76 cm              TR Peak grad:   49.6 mmHg MV Decel Time: 275 msec              TR Vmax:        352.00 cm/s MV E velocity: 94.60 cm/s  103 cm/s MV A velocity: 116.00 cm/s 70.3 cm/s SHUNTS MV E/A ratio:  0.82        1.5       Systemic VTI:  0.27 m                                      Systemic Diam: 1.60 cm Dina Rich MD Electronically signed by Dina Rich MD Signature Date/Time: 01/21/2020/11:41:25 AM    Final      CBC Recent Labs  Lab 01/20/20 1437 01/21/20 0540  WBC 10.2 4.8  HGB 9.3* 8.4*  HCT 29.8* 26.5*  PLT 125* 125*  MCV 104.2* 103.1*  MCH 32.5 32.7  MCHC 31.2 31.7  RDW 13.9 13.8  LYMPHSABS 0.3*  --   MONOABS 0.8  --   EOSABS 0.0  --   BASOSABS 0.0  --      Chemistries  Recent Labs  Lab 01/20/20 1437 01/20/20 2008 01/21/20 0540 01/22/20 0523  NA 121*  120* 123* 126* 128*  K 6.6*  6.6* 5.3* 4.7 4.6  CL 90*  91* 91* 94* 97*  CO2 22  21* 21* 24 24  GLUCOSE 119*  118* 147* 157* 144*  BUN 42*  43* 43* 37* 35*  CREATININE 1.93*  1.92* 1.83* 1.50* 1.27*  CALCIUM 9.6  9.5 9.7 9.3 8.8*  MG 2.5*  --   --   --    ------------------------------------------------------------------------------------------------------------------ No results for input(s): CHOL, HDL, LDLCALC, TRIG, CHOLHDL, LDLDIRECT in the last 72 hours.  Lab Results  Component Value Date   HGBA1C 5.3 09/12/2015   ------------------------------------------------------------------------------------------------------------------ Recent Labs    01/20/20 2008  TSH 32.620*   ------------------------------------------------------------------------------------------------------------------ No results for input(s): VITAMINB12, FOLATE, FERRITIN, TIBC, IRON, RETICCTPCT in the last 72 hours.  Coagulation profile No results for input(s): INR, PROTIME in the last 168 hours.  No results for input(s): DDIMER in the last 72 hours.  Cardiac Enzymes No results for input(s): CKMB, TROPONINI, MYOGLOBIN in the last 168 hours.  Invalid input(s): CK ------------------------------------------------------------------------------------------------------------------    Component Value Date/Time   BNP 1,681.0 (H) 01/20/2020 1437     Roxan Hockey M.D on 01/22/2020 at 6:45 PM  Go to [email or web address] - for contact info  [facility] - Office  [phone]

## 2020-01-22 NOTE — Care Management Important Message (Signed)
Important Message  Patient Details  Name: Katherine Tyler MRN: [id number] Date of Birth: 15-Sep-1929   Medicare Important Message Given:  Yes     Corey Harold 01/22/2020, 2:01 PM

## 2020-01-22 NOTE — Plan of Care (Signed)
  Problem: Acute Rehab PT Goals(only PT should resolve) Goal: Pt Will Go Supine/Side To Sit Outcome: Progressing Flowsheets (Taken 01/22/2020 1331) Pt will go Supine/Side to Sit: with supervision Goal: Pt Will Go Sit To Supine/Side Outcome: Progressing Flowsheets (Taken 01/22/2020 1331) Pt will go Sit to Supine/Side: with supervision Goal: Patient Will Transfer Sit To/From Stand Outcome: Progressing Flowsheets (Taken 01/22/2020 1331) Patient will transfer sit to/from stand: with min guard assist Goal: Pt Will Transfer Bed To Chair/Chair To Bed Outcome: Progressing Flowsheets (Taken 01/22/2020 1331) Pt will Transfer Bed to Chair/Chair to Bed: min guard assist Goal: Pt Will Ambulate Outcome: Progressing Flowsheets (Taken 01/22/2020 1331) Pt will Ambulate:  50 feet  with min guard assist Goal: Pt/caregiver will Perform Home Exercise Program Outcome: Progressing Flowsheets (Taken 01/22/2020 1331) Pt/caregiver will Perform Home Exercise Program:  For increased strengthening  For improved balance  Independently  1:32 PM, 01/22/20 Wyman Songster PT, DPT Physical Therapist at [facility]

## 2020-01-22 NOTE — Evaluation (Signed)
Physical Therapy Evaluation Patient Details Name: CAMARI QUINTANILLA MRN: [id number] DOB: 01-Apr-1929 Today's Date: 01/22/2020   History of Present Illness  Gabrielly Mccrystal  is a 86 y.o. female  With PAFib (dxed 01/2018) on apixaban for stroke prophylaxis, on Cardizem CD and Toprol Xl for rate control, as well h/o HTN, HLD, hypothyroidism, GERD and obesity who presents to the ED with complaint of worsening shortness of breath and dyspnea on exertion with significant wheezing over the last 4+ days but worse over the last couple days    Clinical Impression  Patient functioning near baseline for functional mobility and gait but is limited for mobility as stated below secondary to BLE weakness, fatigue and poor standing balance. Patient on 2L O2 with an SpO2 at 97% which decreases to 93% in several minutes while resting on room air, which drops to 83% with seated movement and improves quickly to 95% with supplemental O2. Patient begins ambulating on 2L O2 with sat in low to mid 90s for first half of distance, her O2 sat drops to 86% while ambulating remaining distance on room air which increases back to 98% on 2L O2 within 2 minutes. Patient demonstrates good seated balance throughout session and requires most assist with transfers to standing. She ambulates with shuffled gait and use of RW.  Patient will benefit from continued physical therapy in hospital and recommended venue below to increase strength, balance, endurance for safe ADLs and gait.      Follow Up Recommendations Home health PT;Supervision for mobility/OOB;Supervision/Assistance - 24 hour    Equipment Recommendations  None recommended by PT    Recommendations for Other Services       Precautions / Restrictions Precautions Precautions: Fall Restrictions Weight Bearing Restrictions: No      Mobility  Bed Mobility Overal bed mobility: Needs Assistance Bed Mobility: Supine to Sit     Supine to sit: HOB elevated;Min guard     General bed  mobility comments: to transition to seated EOB; patient on 2L O2 with an SpO2 at 97% which decreases to 93% in several minutes while resting on room air, Drops to 83% with seated movement and improves quickly to 95% with supplemental O2  Transfers Overall transfer level: Needs assistance Equipment used: Rolling walker (2 wheeled) Transfers: Sit to/from [facility] Sit to Stand: Min assist Stand pivot transfers: Min assist       General transfer comment: uses RW and manual assist as well as verbal and tactile cueing for sequencing  Ambulation/Gait Ambulation/Gait assistance: Min guard Gait Distance (Feet): 25 Feet Assistive device: Rolling walker (2 wheeled) Gait Pattern/deviations: Shuffle;Decreased step length - right;Decreased step length - left;Decreased stride length;Step-through pattern Gait velocity: decreased   General Gait Details: slow, labored, shuffled steps; Patient begins ambulating on 2L O2 with sat in low to mid 90s for first half of distance, Patient O2 sat drops to 86 on room air for remaning distance, increases back to 98% on 2L O2 within 2 minutes  Stairs            Wheelchair Mobility    Modified Rankin (Stroke Patients Only)       Balance Overall balance assessment: Needs assistance Sitting-balance support: No upper extremity supported;Feet supported Sitting balance-Leahy Scale: Good Sitting balance - Comments: seated EOB   Standing balance support: Bilateral upper extremity supported;During functional activity Standing balance-Leahy Scale: Fair Standing balance comment: using RW  Pertinent Vitals/Pain Pain Assessment: No/denies pain    Home Living Family/patient expects to be discharged to:: Private residence Living Arrangements: Children Available Help at Discharge: Family Type of Home: House Home Access: Stairs to enter Entrance Stairs-Rails: None Entrance Stairs-Number of Steps:  1+1 Home Layout: Two level;Able to live on main level with bedroom/bathroom Home Equipment: Dan Humphreys - 2 wheels;Bedside commode;Shower seat;Cane - single point      Prior Function Level of Independence: Needs assistance   Gait / Transfers Assistance Needed: household ambulator with RW  ADL's / Homemaking Assistance Needed: Daughter assists        Hand Dominance   Dominant Hand: Right    Extremity/Trunk Assessment   Upper Extremity Assessment Upper Extremity Assessment: Generalized weakness    Lower Extremity Assessment Lower Extremity Assessment: Generalized weakness    Cervical / Trunk Assessment Cervical / Trunk Assessment: Kyphotic  Communication   Communication: HOH  Cognition Arousal/Alertness: Awake/alert Behavior During Therapy: WFL for tasks assessed/performed Overall Cognitive Status: Within Functional Limits for tasks assessed                                        General Comments      Exercises     Assessment/Plan    PT Assessment Patient needs continued PT services  PT Problem List Decreased strength;Decreased mobility;Decreased activity tolerance;Cardiopulmonary status limiting activity;Decreased balance       PT Treatment Interventions DME instruction;Therapeutic exercise;Gait training;Balance training;Stair training;Neuromuscular re-education;Functional mobility training;Therapeutic activities;Patient/family education    PT Goals (Current goals can be found in the Care Plan section)  Acute Rehab PT Goals Patient Stated Goal: return home with daughter PT Goal Formulation: With patient Time For Goal Achievement: 02/05/20 Potential to Achieve Goals: Good    Frequency Min 3X/week   Barriers to discharge        Co-evaluation               AM-PAC PT "6 Clicks" Mobility  Outcome Measure Help needed turning from your back to your side while in a flat bed without using bedrails?: None Help needed moving from lying on  your back to sitting on the side of a flat bed without using bedrails?: A Little Help needed moving to and from a bed to a chair (including a wheelchair)?: A Little Help needed standing up from a chair using your arms (e.g., wheelchair or bedside chair)?: A Little Help needed to walk in hospital room?: A Little Help needed climbing 3-5 steps with a railing? : A Lot 6 Click Score: 18    End of Session Equipment Utilized During Treatment: Gait belt;Oxygen Activity Tolerance: Patient limited by fatigue;Patient tolerated treatment well Patient left: in chair;with chair alarm set;with call bell/phone within reach Nurse Communication: Mobility status PT Visit Diagnosis: Unsteadiness on feet (R26.81);Other abnormalities of gait and mobility (R26.89);Muscle weakness (generalized) (M62.81)    Time: 4696-2952 PT Time Calculation (min) (ACUTE ONLY): 57 min   Charges:   PT Evaluation $PT Eval Moderate Complexity: 1 Mod PT Treatments $Therapeutic Activity: 38-52 mins       1:28 PM, 01/22/20 Wyman Songster PT, DPT Physical Therapist at [facility]

## 2020-01-23 LAB — CBC
HCT: 30.6 % — ABNORMAL LOW (ref 36.0–46.0)
Hemoglobin: 9.4 g/dL — ABNORMAL LOW (ref 12.0–15.0)
MCH: 33.1 pg (ref 26.0–34.0)
MCHC: 30.7 g/dL (ref 30.0–36.0)
MCV: 107.7 fL — ABNORMAL HIGH (ref 80.0–100.0)
Platelets: 151 10*3/uL (ref 150–400)
RBC: 2.84 MIL/uL — ABNORMAL LOW (ref 3.87–5.11)
RDW: 13.4 % (ref 11.5–15.5)
WBC: 6.2 10*3/uL (ref 4.0–10.5)
nRBC: 0 % (ref 0.0–0.2)

## 2020-01-23 LAB — GLUCOSE, CAPILLARY
Glucose-Capillary: 100 mg/dL — ABNORMAL HIGH (ref 70–99)
Glucose-Capillary: 115 mg/dL — ABNORMAL HIGH (ref 70–99)
Glucose-Capillary: 101 mg/dL — ABNORMAL HIGH (ref 70–99)

## 2020-01-23 LAB — RENAL FUNCTION PANEL
Albumin: 3.2 g/dL — ABNORMAL LOW (ref 3.5–5.0)
Anion gap: 8 (ref 5–15)
BUN: 41 mg/dL — ABNORMAL HIGH (ref 8–23)
CO2: 27 mmol/L (ref 22–32)
Calcium: 9.1 mg/dL (ref 8.9–10.3)
Chloride: 97 mmol/L — ABNORMAL LOW (ref 98–111)
Creatinine, Ser: 1.26 mg/dL — ABNORMAL HIGH (ref 0.44–1.00)
GFR calc Af Amer: 44 mL/min — ABNORMAL LOW (ref >60)
GFR calc non Af Amer: 38 mL/min — ABNORMAL LOW (ref >60)
Glucose, Bld: 113 mg/dL — ABNORMAL HIGH (ref 70–99)
Phosphorus: 3.3 mg/dL (ref 2.5–4.6)
Potassium: 4.2 mmol/L (ref 3.5–5.1)
Sodium: 132 mmol/L — ABNORMAL LOW (ref 135–145)

## 2020-01-23 MED ORDER — PREDNISONE 20 MG PO TABS: 40.0000 mg | ORAL_TABLET | Freq: Every day | ORAL | 0 refills | Status: DC

## 2020-01-23 MED ORDER — METOPROLOL SUCCINATE ER 25 MG PO TB24: 25.0000 mg | ORAL_TABLET | Freq: Every day | ORAL | 3 refills | Status: AC

## 2020-01-23 MED ORDER — FUROSEMIDE 20 MG PO TABS: 20.0000 mg | ORAL_TABLET | ORAL | 3 refills | Status: AC

## 2020-01-23 MED ORDER — LOSARTAN POTASSIUM 25 MG PO TABS: 25.0000 mg | ORAL_TABLET | Freq: Every day | ORAL | 2 refills | Status: AC

## 2020-01-23 MED ORDER — LEVOTHYROXINE SODIUM 175 MCG PO TABS: 175.0000 ug | ORAL_TABLET | Freq: Every day | ORAL | 2 refills | Status: AC

## 2020-01-23 NOTE — Progress Notes (Signed)
Notified by telemetry on 01/23/20 at 0133 that pt had 12 beats where heart rate reached 129 on 01/22/20 at 2355. On 01/23/20 at 0133, pt had 18 beats where heart rate reached 127. Vitals obtained. MD notified.

## 2020-01-23 NOTE — Discharge Instructions (Signed)
1)Very low-salt diet advised 2)Weigh yourself daily, call if you gain more than 3 pounds in 1 day or more than 5 pounds in 1 week as your diuretic medications may need to be adjusted 3)Limit your Fluid  intake to no more than 60 ounces (1.8 Liters) per day 4) you are taking Eliquis/apixaban which is your blood thinner so Avoid ibuprofen/Advil/Aleve/Motrin/Goody Powders/Naproxen/BC powders/Meloxicam/Diclofenac/Indomethacin and other Nonsteroidal anti-inflammatory medications as these will make you more likely to bleed and can cause stomach ulcers, can also cause Kidney problems.  5)Please Stop Cardizem CD 6)Please reduce losartan to 25 mg daily 7)Resume metoprolol/Toprol-XL at 25 mg daily 8) follow-up with the primary care physician in 3 to 4 days for repeat BMP blood test 9) your thyroid test is abnormal, increase levothyroxine to 175 mcg daily, recheck TSH blood test in about 4 weeks

## 2020-01-23 NOTE — TOC Transition Note (Signed)
Transition of Care [facility]) - CM/SW Discharge Note   Patient Details  Name: Katherine Tyler MRN: [id number] Date of Birth: Feb 14, 1929  [facility]) CM/SW Contact:  Mays Paino, [name], RN Phone Number: 01/23/2020, 1:05 PM   Clinical Narrative:   Patient discharging home. Barbara Cower of [facility], notified.     Final next level of care: Home w Home Health Services Barriers to Discharge: Continued Medical Work up   Patient Goals and CMS Choice Patient states their goals for this hospitalization and ongoing recovery are:: To discharge home CMS Medicare.gov Compare Post Acute Care list provided to:: Patient Choice offered to / list presented to : Patient  Discharge Placement                       Discharge Plan and Services     Post Acute Care Choice: Home Health                    HH Arranged: PT [facility] Agency: [facility] Date [facility] Agency Contacted: 01/22/20 Time HH Agency Contacted: [phone] Representative spoke with at [facility] Agency: Alroy Bailiff  Social Determinants of Health (SDOH) Interventions     Readmission Risk Interventions No flowsheet data found.

## 2020-01-23 NOTE — Discharge Summary (Signed)
Katherine Tyler, is a 86 y.o. female  DOB 05/01/1929  MRN [id number].  Admission date:  01/20/2020  Admitting Physician  Roxan Hockey, MD  Discharge Date:  01/23/2020   Primary MD  Sharion Balloon, FNP  Recommendations for primary care physician for things to follow:   1)Very low-salt diet advised 2)Weigh yourself daily, call if you gain more than 3 pounds in 1 day or more than 5 pounds in 1 week as your diuretic medications may need to be adjusted 3)Limit your Fluid  intake to no more than 60 ounces (1.8 Liters) per day 4) you are taking Eliquis/apixaban which is your blood thinner so Avoid ibuprofen/Advil/Aleve/Motrin/Goody Powders/Naproxen/BC powders/Meloxicam/Diclofenac/Indomethacin and other Nonsteroidal anti-inflammatory medications as these will make you more likely to bleed and can cause stomach ulcers, can also cause Kidney problems.  5)Please Stop Cardizem CD 6)Please reduce losartan to 25 mg daily 7)Resume metoprolol/Toprol-XL at 25 mg daily 8) follow-up with the primary care physician in 3 to 4 days for repeat BMP blood test 9) your thyroid test is abnormal, increase levothyroxine to 175 mcg daily, recheck TSH blood test in about 4 weeks  Admission Diagnosis  Hyperkalemia [E87.5]   Discharge Diagnosis  Hyperkalemia [E87.5]    Principal Problem:   Hyperkalemia Active Problems:   Acute wheezy bronchitis with Hypoxia   Acute respiratory failure with hypoxia (HCC)   Bradycardia / Afib with Slow VR in the setting of Hyperkalemia   Acute kidney injury superimposed on CKD III   A-fib ([place])   Current use of long term anticoagulation   Hypothyroidism   Hyponatremia   Essential hypertension   Chronic diastolic CHF (congestive heart failure) ([place])      Past Medical History:  Diagnosis Date  . Anxiety   . Arthritis   . Chronic diastolic CHF (congestive heart failure) ([place])   . GERD  (gastroesophageal reflux disease)   . Hiatal hernia   . Hyperkalemia   . Hyperlipidemia   . Hypertension   . Hypothyroidism   . Iron deficiency anemia   . Obesity   . PAF (paroxysmal atrial fibrillation) ([place])    a. diagnosed in 01/2018. Started on Eliquis for anticoagulation.   . Vitamin D deficiency     Past Surgical History:  Procedure Laterality Date  . CHOLECYSTECTOMY    . I & D EXTREMITY Right 07/12/2019   Procedure: Irrigation And Debridement Extremity;  Surgeon: Wylene Simmer, MD;  Location: [facility];  Service: Orthopedics;  Laterality: Right;  . ORIF ANKLE FRACTURE Right 07/12/2019   Procedure: OPEN  AND REDUCTION INTERNAL FIXATION (ORIF) ANKLE FRACTURE;  Surgeon: Wylene Simmer, MD;  Location: [facility];  Service: Orthopedics;  Laterality: Right;  . TUBAL LIGATION  1960       HPI  from the history and physical done on the day of admission:    Katherine Tyler  is a 86 y.o. female  With PAFib (dxed 01/2018) on apixaban for stroke prophylaxis, on Cardizem CD and Toprol Xl for rate control, as well h/o  HTN, HLD, hypothyroidism, GERD and obesity who presents to the ED with complaint of worsening shortness of breath and dyspnea on exertion with significant wheezing over the last 4+ days but worse over the last couple days -By EMS family reports worsening respiratory symptoms over the last couple days --Additional history obtained from patient's daughter Rayfield Citizen [phone] -In the ED O2 sats was down to 87% on room air, came back up to 97% on 2 L  -No chest pains, no palpitations, no pleuritic symptoms, no increased leg swelling -Patient has been compliant with home meds including apixaban  -Denies fevers, denies productive cough, no vomiting or diarrhea  -In the ED patient is found to be borderline hypoxic, bradycardic with heart rate in the 30s and very wheezy  In ED-- -Patient was 6.6 with a bicarb of 21, sodium was 120, creatinine was up to 1.92, from a baseline of 1.1 chloride was  91 -CBC with a hemoglobin of 9.3 which is slightly higher than patient's prior baseline -Platelets down to 125 from a baseline around 200--no bleeding concerns -BNP elevated at 1681 however chest x-ray without acute findings, specifically no CHF type findings   Hospital Course:     Brief Summary - 86 y.o.femaleWith PAFib (dxed 01/2018) on apixaban for stroke prophylaxis, on Cardizem CD and Toprol Xl for rate control, as well h/o HTN, HLD,hypothyroidism, GERD and obesity admitted with dyspnea and symptomatic bradycardia in the setting of significant electrolyte abnormalities  A/P 1)Hyperkalemia---in the setting of AKI on CKD-potassium is 4.2 from 6.6, bradycardia has resolved -PTA Patient was getting losartan 100mg  and potassium supplements along with magnesium supplements  -Received hyperkalemia cocktail in the ED including calcium gluconate, glucose and insulin, as well as albuterol nebs  -Also received Lokelma and Kayexalate for severe and symptomatic hyperkalemia -c/n  hold potassium supplementation, hold magnesium supplementation   2)AKI----acute kidney injury on CKD stage -III,presented with worsening renal function in the setting of dehydration with Lasix and losartan use -creatinine on admission=1.92, baseline creatinine = 1.1 (08/05/2019), creatinine has improved and is now  down to 1. 26  --renally adjust medications, avoid nephrotoxic agents / dehydration / hypotension -hold Lasix -Decrease losartan to 25 mg daily from 100 mg  3)Social/Ethics-discussed withdaughter 08/07/2019 [phone], patient is a full code  4)HypoNatremia/Hypochloremia- Na is up to 132 from 120,chloride is up to 97 from 91, -Sodium was 138 on 08/05/2019 and chloride was 105, -No need for further IV fluids -Hold Lasix  5)HFpEF---patient with history of chronic diastolic CHF (d2CHF), repeat echo with EF of 70 to 75% with  diastolic dysfunction -Elevated BNP noted, chest x-ray without  CHF type findings, clinically does not appear volume overloaded, actually patient iscurrentlyclinically dehydrated -Responded well to gentle IV hydration, and discontinuation of Lasix -Okay to decrease losartan to 25 mg from 100 mg  6)ParoxysmalAtrialFibrillation with slow ventricular rate---bradycardia with heart rates in the 30s most likely secondary to hyperkalemia as well as concomitant use of Cardizem CD and Toprol-XL -Bradycardia resolved, patient appears to be in sinus rhythm at this time --Discontinued Cardizem CD due to bradycardia, may restart Toprol-XL 25 mg daily, echo noted,serummagnesiumis 2.5  7)Hypothyroidism--- TSH is 32, increase levothyroxine to 175 mcg , repeat TSH in 4 weeks  8)HTN---BP improved with medication adjustments  restarted Toprol-XL at 25 mg daily, --ok to RestartLosartan 25 mg instead of 100mg ,   9)AcuteHypoxicRespiratoryFailure----??secondary toAcutewheezy bronchitis --No tobacco use -Overall much improved, continue Bronchodilators, mucolytics and steroids as ordered  10)chronic anemia of chronic disease---prior anemia work-up was  not consistent with iron deficiency,hemoglobin is 9.4 , patient baseline usually around 9  -Patient has received transfusion in the past   Disposition--- discharge home with home health on 01/23/2020  Code Status :  Full code  Family Communication:   (patient is alert, awake and coherent) Discussed with patient's daughter Eber JonesCarolyn, also called and discussed with daughter [name]  Consults  :  na   Discharge Condition: Stable  Follow UP  Follow-up Information    Junie SpencerHawks, Christy A, FNP. Schedule an appointment as soon as possible for a visit in 3 day(s).   Specialty: Family Medicine Why: Repeat BMP and CBC within 3 days Contact information: [address] [place] [address] [phone]        Jonelle SidleMcDowell, Samuel G, MD .   Specialty: Cardiology Contact information: [address] [place] [address] [phone]          Diet and Activity recommendation:  As advised  Discharge Instructions    Discharge Instructions    Call MD for:  difficulty breathing, headache or visual disturbances   Complete by: As directed    Call MD for:  extreme fatigue   Complete by: As directed    Call MD for:  persistant dizziness or light-headedness   Complete by: As directed    Call MD for:  persistant nausea and vomiting   Complete by: As directed    Call MD for:  severe uncontrolled pain   Complete by: As directed    Call MD for:  temperature >100.4   Complete by: As directed    Diet - low sodium heart healthy   Complete by: As directed    Discharge instructions   Complete by: As directed    1)Very low-salt diet advised 2)Weigh yourself daily, call if you gain more than 3 pounds in 1 day or more than 5 pounds in 1 week as your diuretic medications may need to be adjusted 3)Limit your Fluid  intake to no more than 60 ounces (1.8 Liters) per day 4) you are taking Eliquis/apixaban which is your blood thinner so Avoid ibuprofen/Advil/Aleve/Motrin/Goody Powders/Naproxen/BC powders/Meloxicam/Diclofenac/Indomethacin and other Nonsteroidal anti-inflammatory medications as these will make you more likely to bleed and can cause stomach ulcers, can also cause Kidney problems.  5)Please Stop Cardizem CD 6)Please reduce losartan to 25 mg daily 7)Resume metoprolol/Toprol-XL at 25 mg daily 8) follow-up with the primary care physician in 3 to 4 days for repeat BMP blood test 9) your thyroid test is abnormal, increase levothyroxine to 175 mcg daily, recheck TSH blood test in about 4 weeks   Increase activity slowly   Complete by: As directed        Discharge Medications     Allergies as of 01/23/2020      Reactions   Terazosin Hcl Other (See Comments)   Muscle aches   Keflex [cephalexin] Other (See Comments)   Unknown reaction      Medication List    STOP taking these  medications   diltiazem 180 MG 24 hr capsule Commonly known as: CARDIZEM CD     TAKE these medications   acetaminophen 325 MG tablet Commonly known as: TYLENOL Take 1-2 tablets (325-650 mg total) by mouth every 4 (four) hours as needed for mild pain.   apixaban 5 MG Tabs tablet Commonly known as: ELIQUIS Take 1 tablet (5 mg total) by mouth 2 (two) times daily.   citalopram 20 MG tablet Commonly known as: CELEXA Take 1 tablet (20 mg  total) by mouth daily.   D3-1000 25 MCG (1000 UT) capsule Generic drug: Cholecalciferol Take 1,000 Units by mouth 2 (two) times daily.   Ferrous Sulfate Dried 45 MG Tbcr Commonly known as: Slow Release Iron Take 45 mg by mouth 2 (two) times daily.   furosemide 20 MG tablet Commonly known as: LASIX Take 1 tablet (20 mg total) by mouth every Monday, Wednesday, and Friday for 15 days. Start taking on: January 25, 2020 What changed: when to take this   levothyroxine 175 MCG tablet Commonly known as: SYNTHROID Take 1 tablet (175 mcg total) by mouth daily at 6 (six) AM. Start taking on: January 24, 2020 What changed:   medication strength  how much to take  when to take this   losartan 25 MG tablet Commonly known as: COZAAR Take 1 tablet (25 mg total) by mouth daily. Start taking on: January 24, 2020 What changed:   medication strength  how much to take   magnesium oxide 400 (241.3 Mg) MG tablet Commonly known as: MAG-OX Take 1 tablet (400 mg total) by mouth daily. What changed: when to take this   metoprolol succinate 25 MG 24 hr tablet Commonly known as: Toprol XL Take 1 tablet (25 mg total) by mouth daily. What changed: when to take this   nystatin powder Commonly known as: MYCOSTATIN/NYSTOP Apply topically 4 (four) times daily. What changed:   how much to take  when to take this  reasons to take this   polyethylene glycol 17 g packet Commonly known as: MIRALAX / GLYCOLAX Take 17 g by mouth daily. What changed:     when to take this  reasons to take this   potassium chloride SA 20 MEQ tablet Commonly known as: KLOR-CON Take 1 tablet by mouth once daily What changed: when to take this   predniSONE 20 MG tablet Commonly known as: Deltasone Take 2 tablets (40 mg total) by mouth daily with breakfast.   senna-docusate 8.6-50 MG tablet Commonly known as: Senokot-S Take 2 tablets by mouth 2 (two) times daily. Adjust it up and down to make sure that you have a good BM daily. What changed:   when to take this  reasons to take this  additional instructions   thiamine 100 MG tablet Commonly known as: VITAMIN B-1 Take 100 mg by mouth every morning.       Major procedures and Radiology Reports - PLEASE review detailed and final reports for all details, in brief -    DG Chest Portable 1 View  Result Date: 01/20/2020 CLINICAL DATA:  Shortness of breath. EXAM: PORTABLE CHEST 1 VIEW COMPARISON:  July 12, 2019. FINDINGS: Stable cardiomediastinal silhouette. Atherosclerosis of thoracic aorta is noted. No pneumothorax or pleural effusion is noted. Hypoinflation of the lungs is noted with mild bibasilar subsegmental atelectasis. Bony thorax is unremarkable. IMPRESSION: Hypoinflation of the lungs with mild bibasilar subsegmental atelectasis. Electronically Signed   By: Lupita Raider M.D.   On: 01/20/2020 14:30   ECHOCARDIOGRAM COMPLETE  Result Date: 01/21/2020    ECHOCARDIOGRAM REPORT   Patient Name:   Katherine Tyler Date of Exam: 01/21/2020 Medical Rec #:  [id number]   Height:       67.0 in Accession #:    [id number]  Weight:       224.9 lb Date of Birth:  15-Jun-1929    BSA:          2.13 m Patient Age:    37 years  BP:           151/68 mmHg Patient Gender: F           HR:           84 bpm. [facility] Procedure: 2D Echo Indications:    Abnormal ECG 794.31 / R94.31  History:        Patient has prior history of Echocardiogram examinations, most                 recent 01/15/2018. CHF,  Arrythmias:Atrial Fibrillation; Risk                 Factors:Non-Smoker, Dyslipidemia and Hypertension. GERD,                 Bradycardia / Afib with Slow VR.  Sonographer:    Jeryl ColumbiaJohanna Elliott RDCS (AE) Referring Phys: [id number] Karilyn Wind IMPRESSIONS  1. Left ventricular ejection fraction, by estimation, is 70 to 75%. The left ventricle has normal function. The left ventrical has no regional wall motion abnormalities. There is mildly increased left ventricular hypertrophy. Left ventricular diastolic parameters are consistent with Grade I diastolic dysfunction (impaired relaxation).  2. Right ventricular systolic function is normal. The right ventricular size is normal. There is moderately elevated pulmonary artery systolic pressure.  3. Left atrial size was severely dilated.  4. Right atrial size was mildly dilated.  5. The mitral valve is normal in structure and function. Mild mitral valve regurgitation. No evidence of mitral stenosis.  6. The aortic valve is tricuspid. Aortic valve regurgitation is not visualized. No aortic stenosis is present.  7. There is moderate pulmonary hypertension, PASP is 53 mmHg.  8. The inferior vena cava is normal in size with greater than 50% respiratory variability, suggesting right atrial pressure of 3 mmHg. FINDINGS  Left Ventricle: Left ventricular ejection fraction, by estimation, is 70 to 75%. The left ventricle has normal function. The left ventricle has no regional wall motion abnormalities. There is mildly increased left ventricular hypertrophy. Left ventricular diastolic parameters are consistent with Grade I diastolic dysfunction (impaired relaxation). Right Ventricle: The right ventricular size is normal. No increase in right ventricular wall thickness. Right ventricular systolic function is normal. There is moderately elevated pulmonary artery systolic pressure. The tricuspid regurgitant velocity is 3.52 m/s, and with an assumed right atrial pressure of 10 mmHg, the  estimated right ventricular systolic pressure is 59.6 mmHg. Left Atrium: Left atrial size was severely dilated. Right Atrium: Right atrial size was mildly dilated. Pericardium: There is no evidence of pericardial effusion. Mitral Valve: The mitral valve is normal in structure and function. Mild mitral valve regurgitation. No evidence of mitral valve stenosis. Tricuspid Valve: The tricuspid valve is normal in structure. Tricuspid valve regurgitation is mild . No evidence of tricuspid stenosis. Aortic Valve: The aortic valve is tricuspid. Aortic valve regurgitation is not visualized. No aortic stenosis is present. Aortic valve mean gradient measures 9.2 mmHg. Aortic valve peak gradient measures 17.9 mmHg. Aortic valve area, by VTI measures 1.26  cm. Pulmonic Valve: The pulmonic valve was not well visualized. Pulmonic valve regurgitation is not visualized. No evidence of pulmonic stenosis. Aorta: The aortic root is normal in size and structure. Pulmonary Artery: There is moderate pulmonary hypertension, PASP is 53 mmHg. Venous: The inferior vena cava is normal in size with greater than 50% respiratory variability, suggesting right atrial pressure of 3 mmHg. IAS/Shunts: No atrial level shunt detected by color flow Doppler.  LEFT VENTRICLE PLAX 2D  LVIDd:         5.30 cm  Diastology LVIDs:         2.49 cm  LV e' lateral:   9.25 cm/s LV PW:         1.15 cm  LV E/e' lateral: 10.2 LV IVS:        1.03 cm  LV e' medial:    5.44 cm/s LVOT diam:     1.60 cm  LV E/e' medial:  17.4 LV SV:         55.25 ml LV SV Index:   50.64 LVOT Area:     2.01 cm  RIGHT VENTRICLE RV S prime:     21.30 cm/s TAPSE (M-mode): 2.4 cm LEFT ATRIUM              Index       RIGHT ATRIUM           Index LA diam:        4.40 cm  2.07 cm/m  RA Area:     17.70 cm LA Vol (A2C):   111.0 ml 52.22 ml/m RA Volume:   45.40 ml  21.36 ml/m LA Vol (A4C):   99.8 ml  46.95 ml/m LA Biplane Vol: 109.0 ml 51.28 ml/m  AORTIC VALVE AV Area (Vmax):    1.17 cm AV  Area (Vmean):   1.18 cm AV Area (VTI):     1.26 cm AV Vmax:           211.75 cm/s AV Vmean:          144.560 cm/s AV VTI:            0.438 m AV Peak Grad:      17.9 mmHg AV Mean Grad:      9.2 mmHg LVOT Vmax:         123.69 cm/s LVOT Vmean:        84.834 cm/s LVOT VTI:          0.275 m LVOT/AV VTI ratio: 0.63  AORTA Ao Root diam: 3.20 cm MITRAL VALVE                         TRICUSPID VALVE MV Area (PHT): 2.76 cm              TR Peak grad:   49.6 mmHg MV Decel Time: 275 msec              TR Vmax:        352.00 cm/s MV E velocity: 94.60 cm/s  103 cm/s MV A velocity: 116.00 cm/s 70.3 cm/s SHUNTS MV E/A ratio:  0.82        1.5       Systemic VTI:  0.27 m                                      Systemic Diam: 1.60 cm Dina Rich MD Electronically signed by Dina Rich MD Signature Date/Time: 01/21/2020/11:41:25 AM    Final     Micro Results    Recent Results (from the past 240 hour(s))  Respiratory Panel by RT PCR (Flu A&B, Covid) - Nasopharyngeal Swab     Status: None   Collection Time: 01/20/20  3:04 PM   Specimen: Nasopharyngeal Swab  Result Value Ref Range Status   SARS Coronavirus 2 by RT PCR NEGATIVE NEGATIVE Final  Comment: (NOTE) SARS-CoV-2 target nucleic acids are NOT DETECTED. The SARS-CoV-2 RNA is generally detectable in upper respiratoy specimens during the acute phase of infection. The lowest concentration of SARS-CoV-2 viral copies this assay can detect is 131 copies/mL. A negative result does not preclude SARS-Cov-2 infection and should not be used as the sole basis for treatment or other patient management decisions. A negative result may occur with  improper specimen collection/handling, submission of specimen other than nasopharyngeal swab, presence of viral mutation(s) within the areas targeted by this assay, and inadequate number of viral copies (<131 copies/mL). A negative result must be combined with clinical observations, patient history, and epidemiological  information. The expected result is Negative. Fact Sheet for Patients:  https://www.moore.com/ Fact Sheet for Healthcare Providers:  https://www.young.biz/ This test is not yet ap proved or cleared by the [place] FDA and  has been authorized for detection and/or diagnosis of SARS-CoV-2 by FDA under an Emergency Use Authorization (EUA). This EUA will remain  in effect (meaning this test can be used) for the duration of the COVID-19 declaration under Section 564(b)(1) of the Act, 21 U.S.C. section 360bbb-3(b)(1), unless the authorization is terminated or revoked sooner.    Influenza A by PCR NEGATIVE NEGATIVE Final   Influenza B by PCR NEGATIVE NEGATIVE Final    Comment: (NOTE) The Xpert Xpress SARS-CoV-2/FLU/RSV assay is intended as an aid in  the diagnosis of influenza from Nasopharyngeal swab specimens and  should not be used as a sole basis for treatment. Nasal washings and  aspirates are unacceptable for Xpert Xpress SARS-CoV-2/FLU/RSV  testing. Fact Sheet for Patients: https://www.moore.com/ Fact Sheet for Healthcare Providers: https://www.young.biz/ This test is not yet approved or cleared by the [place] FDA and  has been authorized for detection and/or diagnosis of SARS-CoV-2 by  FDA under an Emergency Use Authorization (EUA). This EUA will remain  in effect (meaning this test can be used) for the duration of the  Covid-19 declaration under Section 564(b)(1) of the Act, 21  U.S.C. section 360bbb-3(b)(1), unless the authorization is  terminated or revoked. Performed at [facility], [address], [place], [place] [address]        Today   Subjective    Katherine Tyler today has no new complaints -Eating and drinking well, no chest pains no shortness of breath no palpitations no dizziness       Patient has been seen and examined prior to discharge   Objective   Blood pressure (!)  144/53, pulse 97, temperature 98 F (36.7 C), temperature source Oral, resp. rate 18, height 5\' 7"  (1.702 m), weight 103.4 kg, SpO2 100 %.   Intake/Output Summary (Last 24 hours) at 01/23/2020 1206 Last data filed at 01/23/2020 0900 Gross per 24 hour  Intake 800 ml  Output 1450 ml  Net -650 ml    Exam Gen:- Awake Alert, able to speak in sentences HEENT:- [place].AT, No sclera icterus Neck-Supple Neck,No JVD,.  Lungs-fair air movement bilaterally, no significant wheezing CV- S1, S2 normal, irregular but not irregularly irregular Abd-  +ve B.Sounds, Abd Soft, No tenderness,    Extremity/Skin:- No  edema, pedal pulses present  Psych-affect is appropriate, oriented x3 Neuro-generalized weakness, no new focal deficits, no tremors   Data Review   CBC w Diff:  Lab Results  Component Value Date   WBC 6.2 01/23/2020   HGB 9.4 (L) 01/23/2020   HGB 11.3 01/29/2019   HCT 30.6 (L) 01/23/2020   HCT 32.5 (L) 01/29/2019   PLT  151 01/23/2020   PLT 139 (L) 01/29/2019   LYMPHOPCT 3 01/20/2020   MONOPCT 8 01/20/2020   EOSPCT 0 01/20/2020   BASOPCT 0 01/20/2020    CMP:  Lab Results  Component Value Date   NA 132 (L) 01/23/2020   NA 133 (L) 01/29/2019   K 4.2 01/23/2020   CL 97 (L) 01/23/2020   CO2 27 01/23/2020   BUN 41 (H) 01/23/2020   BUN 37 (H) 01/29/2019   CREATININE 1.26 (H) 01/23/2020   CREATININE 1.11 (H) 04/16/2013   PROT 5.2 (L) 07/30/2019   PROT 6.5 11/20/2018   ALBUMIN 3.2 (L) 01/23/2020   ALBUMIN 4.0 11/20/2018   BILITOT 0.7 07/30/2019   BILITOT 0.3 11/20/2018   ALKPHOS 52 07/30/2019   AST 24 07/30/2019   ALT 16 07/30/2019  .   Total Discharge time is about 33 minutes  Shon Hale M.D on 01/23/2020 at 12:06 PM  Go to [email or web address] -  for contact info  [facility] - Office  [phone]

## 2020-01-25 ENCOUNTER — Encounter: Payer: Self-pay | Admitting: *Deleted

## 2020-01-25 DIAGNOSIS — N183 Chronic kidney disease, stage 3 unspecified: Secondary | ICD-10-CM | POA: Diagnosis not present

## 2020-01-25 DIAGNOSIS — K449 Diaphragmatic hernia without obstruction or gangrene: Secondary | ICD-10-CM | POA: Diagnosis not present

## 2020-01-25 DIAGNOSIS — E871 Hypo-osmolality and hyponatremia: Secondary | ICD-10-CM | POA: Diagnosis not present

## 2020-01-25 DIAGNOSIS — N179 Acute kidney failure, unspecified: Secondary | ICD-10-CM | POA: Diagnosis not present

## 2020-01-25 DIAGNOSIS — I480 Paroxysmal atrial fibrillation: Secondary | ICD-10-CM | POA: Diagnosis not present

## 2020-01-25 DIAGNOSIS — E875 Hyperkalemia: Secondary | ICD-10-CM | POA: Diagnosis not present

## 2020-01-25 DIAGNOSIS — K219 Gastro-esophageal reflux disease without esophagitis: Secondary | ICD-10-CM | POA: Diagnosis not present

## 2020-01-25 DIAGNOSIS — Z6835 Body mass index (BMI) 35.0-35.9, adult: Secondary | ICD-10-CM | POA: Diagnosis not present

## 2020-01-25 DIAGNOSIS — E039 Hypothyroidism, unspecified: Secondary | ICD-10-CM | POA: Diagnosis not present

## 2020-01-25 DIAGNOSIS — I5032 Chronic diastolic (congestive) heart failure: Secondary | ICD-10-CM | POA: Diagnosis not present

## 2020-01-25 DIAGNOSIS — R001 Bradycardia, unspecified: Secondary | ICD-10-CM | POA: Diagnosis not present

## 2020-01-25 DIAGNOSIS — J209 Acute bronchitis, unspecified: Secondary | ICD-10-CM | POA: Diagnosis not present

## 2020-01-25 DIAGNOSIS — J9601 Acute respiratory failure with hypoxia: Secondary | ICD-10-CM | POA: Diagnosis not present

## 2020-01-25 DIAGNOSIS — E669 Obesity, unspecified: Secondary | ICD-10-CM | POA: Diagnosis not present

## 2020-01-25 DIAGNOSIS — E559 Vitamin D deficiency, unspecified: Secondary | ICD-10-CM | POA: Diagnosis not present

## 2020-01-25 DIAGNOSIS — I130 Hypertensive heart and chronic kidney disease with heart failure and stage 1 through stage 4 chronic kidney disease, or unspecified chronic kidney disease: Secondary | ICD-10-CM | POA: Diagnosis not present

## 2020-01-25 DIAGNOSIS — Z7901 Long term (current) use of anticoagulants: Secondary | ICD-10-CM | POA: Diagnosis not present

## 2020-01-25 DIAGNOSIS — E785 Hyperlipidemia, unspecified: Secondary | ICD-10-CM | POA: Diagnosis not present

## 2020-01-25 DIAGNOSIS — Z87891 Personal history of nicotine dependence: Secondary | ICD-10-CM | POA: Diagnosis not present

## 2020-01-25 DIAGNOSIS — D509 Iron deficiency anemia, unspecified: Secondary | ICD-10-CM | POA: Diagnosis not present

## 2020-01-25 DIAGNOSIS — M1990 Unspecified osteoarthritis, unspecified site: Secondary | ICD-10-CM | POA: Diagnosis not present

## 2020-01-25 DIAGNOSIS — F419 Anxiety disorder, unspecified: Secondary | ICD-10-CM | POA: Diagnosis not present

## 2020-01-25 DIAGNOSIS — D631 Anemia in chronic kidney disease: Secondary | ICD-10-CM | POA: Diagnosis not present

## 2020-01-25 NOTE — Progress Notes (Signed)
TRANSITIONAL CARE MANAGEMENT TELEPHONE OUTREACH NOTE   Contact Date: 01/25/2020 Contacted By: Georgina Pillion, LPN    DISCHARGE INFORMATION Date of Discharge:01/23/20  Discharge Facility: [facility] Discharge Diagnosis:HTN    Outpatient Follow Up Recommendations (copied from discharge summary) Follow up with PCP in 3-4 days for follow up and BMP  Also sent home with Home health   Katherine Tyler is a female primary care patient of Sharion Balloon, FNP. An outgoing telephone call was made today and I spoke with her.  Katherine Tyler condition(s) and treatment(s) were discussed. An opportunity to ask questions was provided and all were answered or forwarded as appropriate.    ACTIVITIES OF DAILY LIVING  Katherine Tyler lives with their family and she can perform ADLs independently. her primary caregiver is her daughter. she is able to depend on her primary caregiver(s) for consistent help. Transportation to appointments, to pick up medications, and to run errands is a problem. Per one daughter, the other daughter helps her with transportation. (Consider referral to [facility] if transportation or a consistent caregiver is a problem)   Fall Risk Fall Risk  05/26/2019 11/20/2018  Falls in the past year? 0 0  Number falls in past yr: - -  Injury with Fall? - -  Comment - -  Risk Factor Category  - -  Comment - -  Risk for fall due to : - -    medium [place] Modifications/Assistive Devices Wheelchair: No Cane: No Ramp: No Bedside Toilet: No Hospital Bed:  No    [place] she is receiving home health unknown services. This was ordered per this hospital OV     MEDICATION RECONCILIATION  Ms. Tamer has been able to pick-up all prescribed discharge medications from the pharmacy.   A post discharge medication reconciliation was performed and the complete medication list was reviewed with the patient/caregiver and is current as of 01/25/2020. Changes  highlighted below.  Discontinued Medications 1)Very low-salt diet advised 2)Weigh yourself daily, call if you gain more than 3 pounds in 1 day or more than 5 pounds in 1 week as your diuretic medications may need to be adjusted 3)Limit your Fluid  intake to no more than 60 ounces (1.8 Liters) per day 4) you are taking Eliquis/apixaban which is your blood thinner so Avoid ibuprofen/Advil/Aleve/Motrin/Goody Powders/Naproxen/BC powders/Meloxicam/Diclofenac/Indomethacin and other Nonsteroidal anti-inflammatory medications as these will make you more likely to bleed and can cause stomach ulcers, can also cause Kidney problems.  5)Please Stop Cardizem CD 6)Please reduce losartan to 25 mg daily 7)Resume metoprolol/Toprol-XL at 25 mg daily 8) follow-up with the primary care physician in 3 to 4 days for repeat BMP blood test 9) your thyroid test is abnormal, increase levothyroxine to 175 mcg daily, recheck TSH blood test in about 4 weeks  Current Medication List Allergies as of 01/25/2020      Reactions   Terazosin Hcl Other (See Comments)   Muscle aches   Keflex [cephalexin] Other (See Comments)   Unknown reaction      Medication List       Accurate as of January 25, 2020  9:39 AM. If you have any questions, ask your nurse or doctor.        acetaminophen 325 MG tablet Commonly known as: TYLENOL Take 1-2 tablets (325-650 mg total) by mouth every 4 (four) hours as needed for mild pain.   apixaban 5 MG Tabs tablet Commonly known as: ELIQUIS  Take 1 tablet (5 mg total) by mouth 2 (two) times daily.   citalopram 20 MG tablet Commonly known as: CELEXA Take 1 tablet (20 mg total) by mouth daily.   D3-1000 25 MCG (1000 UT) capsule Generic drug: Cholecalciferol Take 1,000 Units by mouth 2 (two) times daily.   Ferrous Sulfate Dried 45 MG Tbcr Commonly known as: Slow Release Iron Take 45 mg by mouth 2 (two) times daily.   furosemide 20 MG tablet Commonly known as: LASIX Take 1 tablet  (20 mg total) by mouth every Monday, Wednesday, and Friday for 15 days.   levothyroxine 175 MCG tablet Commonly known as: SYNTHROID Take 1 tablet (175 mcg total) by mouth daily at 6 (six) AM.   losartan 25 MG tablet Commonly known as: COZAAR Take 1 tablet (25 mg total) by mouth daily.   magnesium oxide 400 (241.3 Mg) MG tablet Commonly known as: MAG-OX Take 1 tablet (400 mg total) by mouth daily. What changed: when to take this   metoprolol succinate 25 MG 24 hr tablet Commonly known as: Toprol XL Take 1 tablet (25 mg total) by mouth daily.   nystatin powder Commonly known as: MYCOSTATIN/NYSTOP Apply topically 4 (four) times daily. What changed:   how much to take  when to take this  reasons to take this   polyethylene glycol 17 g packet Commonly known as: MIRALAX / GLYCOLAX Take 17 g by mouth daily. What changed:   when to take this  reasons to take this   potassium chloride SA 20 MEQ tablet Commonly known as: KLOR-CON Take 1 tablet by mouth once daily What changed: when to take this   predniSONE 20 MG tablet Commonly known as: Deltasone Take 2 tablets (40 mg total) by mouth daily with breakfast.   senna-docusate 8.6-50 MG tablet Commonly known as: Senokot-S Take 2 tablets by mouth 2 (two) times daily. Adjust it up and down to make sure that you have a good BM daily. What changed:   when to take this  reasons to take this  additional instructions   thiamine 100 MG tablet Commonly known as: VITAMIN B-1 Take 100 mg by mouth every morning.        PATIENT EDUCATION & FOLLOW-UP PLAN  An appointment for Transitional Care Management is scheduled with Junie Spencer, FNP on 01/28/20 at 11:40am.  Take all medications as prescribed  Contact our office by calling [phone] if you have any questions or concerns

## 2020-01-26 ENCOUNTER — Telehealth: Payer: Self-pay | Admitting: Family

## 2020-01-26 NOTE — Telephone Encounter (Signed)
Patient is scheduled for TOC visit this week. Patient's daughter states that patient is not physically able to come to office for visit. They do not have access to do video visit, have home phones only.

## 2020-01-27 DIAGNOSIS — I5032 Chronic diastolic (congestive) heart failure: Secondary | ICD-10-CM | POA: Diagnosis not present

## 2020-01-27 DIAGNOSIS — E875 Hyperkalemia: Secondary | ICD-10-CM | POA: Diagnosis not present

## 2020-01-27 DIAGNOSIS — N179 Acute kidney failure, unspecified: Secondary | ICD-10-CM | POA: Diagnosis not present

## 2020-01-27 DIAGNOSIS — J9601 Acute respiratory failure with hypoxia: Secondary | ICD-10-CM | POA: Diagnosis not present

## 2020-01-27 DIAGNOSIS — J209 Acute bronchitis, unspecified: Secondary | ICD-10-CM | POA: Diagnosis not present

## 2020-01-27 DIAGNOSIS — I130 Hypertensive heart and chronic kidney disease with heart failure and stage 1 through stage 4 chronic kidney disease, or unspecified chronic kidney disease: Secondary | ICD-10-CM | POA: Diagnosis not present

## 2020-01-28 ENCOUNTER — Inpatient Hospital Stay: Payer: Medicare Other | Admitting: Family

## 2020-01-28 DIAGNOSIS — I130 Hypertensive heart and chronic kidney disease with heart failure and stage 1 through stage 4 chronic kidney disease, or unspecified chronic kidney disease: Secondary | ICD-10-CM | POA: Diagnosis not present

## 2020-01-28 DIAGNOSIS — E875 Hyperkalemia: Secondary | ICD-10-CM | POA: Diagnosis not present

## 2020-01-28 DIAGNOSIS — I5032 Chronic diastolic (congestive) heart failure: Secondary | ICD-10-CM | POA: Diagnosis not present

## 2020-01-28 DIAGNOSIS — J209 Acute bronchitis, unspecified: Secondary | ICD-10-CM | POA: Diagnosis not present

## 2020-01-28 DIAGNOSIS — N179 Acute kidney failure, unspecified: Secondary | ICD-10-CM | POA: Diagnosis not present

## 2020-01-28 DIAGNOSIS — J9601 Acute respiratory failure with hypoxia: Secondary | ICD-10-CM | POA: Diagnosis not present

## 2020-01-29 NOTE — Telephone Encounter (Signed)
We can only do a TOC face to face or video. They do not own a smart phone? If not please switch to hospital follow up.

## 2020-01-29 NOTE — Telephone Encounter (Signed)
No way to do video visit so will change to televisit daughter aware.

## 2020-01-30 ENCOUNTER — Encounter (HOSPITAL_COMMUNITY)
Admission: RE | Admit: 2020-01-30 | Discharge: 2020-01-30 | Disposition: A | Discharge status: Home / Self Care | Payer: Medicare Other | Source: Ambulatory Visit | Attending: Family Medicine | Admitting: Family Medicine

## 2020-01-30 DIAGNOSIS — N179 Acute kidney failure, unspecified: Secondary | ICD-10-CM | POA: Diagnosis not present

## 2020-01-30 DIAGNOSIS — E875 Hyperkalemia: Secondary | ICD-10-CM | POA: Diagnosis not present

## 2020-01-30 DIAGNOSIS — J209 Acute bronchitis, unspecified: Secondary | ICD-10-CM | POA: Diagnosis not present

## 2020-01-30 DIAGNOSIS — I130 Hypertensive heart and chronic kidney disease with heart failure and stage 1 through stage 4 chronic kidney disease, or unspecified chronic kidney disease: Secondary | ICD-10-CM | POA: Diagnosis not present

## 2020-01-30 DIAGNOSIS — J9601 Acute respiratory failure with hypoxia: Secondary | ICD-10-CM | POA: Diagnosis not present

## 2020-01-30 DIAGNOSIS — I5032 Chronic diastolic (congestive) heart failure: Secondary | ICD-10-CM | POA: Diagnosis not present

## 2020-01-30 LAB — CBC WITH DIFFERENTIAL/PLATELET
Abs Immature Granulocytes: 0.02 10*3/uL (ref 0.00–0.07)
Basophils Absolute: 0 10*3/uL (ref 0.0–0.1)
Basophils Relative: 0 %
Eosinophils Absolute: 0.2 10*3/uL (ref 0.0–0.5)
Eosinophils Relative: 3 %
HCT: 36.2 % (ref 36.0–46.0)
Hemoglobin: 11.4 g/dL — ABNORMAL LOW (ref 12.0–15.0)
Immature Granulocytes: 0 %
Lymphocytes Relative: 16 %
Lymphs Abs: 1.2 10*3/uL (ref 0.7–4.0)
MCH: 32.9 pg (ref 26.0–34.0)
MCHC: 31.5 g/dL (ref 30.0–36.0)
MCV: 104.3 fL — ABNORMAL HIGH (ref 80.0–100.0)
Monocytes Absolute: 0.6 10*3/uL (ref 0.1–1.0)
Monocytes Relative: 8 %
Neutro Abs: 5.1 10*3/uL (ref 1.7–7.7)
Neutrophils Relative %: 73 %
Platelets: 207 10*3/uL (ref 150–400)
RBC: 3.47 MIL/uL — ABNORMAL LOW (ref 3.87–5.11)
RDW: 14.1 % (ref 11.5–15.5)
WBC: 7.2 10*3/uL (ref 4.0–10.5)
nRBC: 0 % (ref 0.0–0.2)

## 2020-01-30 LAB — BASIC METABOLIC PANEL
Anion gap: 9 (ref 5–15)
BUN: 19 mg/dL (ref 8–23)
CO2: 32 mmol/L (ref 22–32)
Calcium: 8.5 mg/dL — ABNORMAL LOW (ref 8.9–10.3)
Chloride: 96 mmol/L — ABNORMAL LOW (ref 98–111)
Creatinine, Ser: 1.25 mg/dL — ABNORMAL HIGH (ref 0.44–1.00)
GFR calc Af Amer: 44 mL/min — ABNORMAL LOW (ref >60)
GFR calc non Af Amer: 38 mL/min — ABNORMAL LOW (ref >60)
Glucose, Bld: 95 mg/dL (ref 70–99)
Potassium: 3.3 mmol/L — ABNORMAL LOW (ref 3.5–5.1)
Sodium: 137 mmol/L (ref 135–145)

## 2020-02-01 ENCOUNTER — Encounter: Payer: Self-pay | Admitting: Family

## 2020-02-01 ENCOUNTER — Ambulatory Visit (INDEPENDENT_AMBULATORY_CARE_PROVIDER_SITE_OTHER): Payer: Medicare Other | Admitting: Family

## 2020-02-01 DIAGNOSIS — I5032 Chronic diastolic (congestive) heart failure: Secondary | ICD-10-CM

## 2020-02-01 DIAGNOSIS — E875 Hyperkalemia: Secondary | ICD-10-CM

## 2020-02-01 DIAGNOSIS — R001 Bradycardia, unspecified: Secondary | ICD-10-CM | POA: Diagnosis not present

## 2020-02-01 DIAGNOSIS — Z09 Encounter for follow-up examination after completed treatment for conditions other than malignant neoplasm: Secondary | ICD-10-CM

## 2020-02-01 DIAGNOSIS — I4891 Unspecified atrial fibrillation: Secondary | ICD-10-CM | POA: Diagnosis not present

## 2020-02-01 DIAGNOSIS — N179 Acute kidney failure, unspecified: Secondary | ICD-10-CM

## 2020-02-01 DIAGNOSIS — N189 Chronic kidney disease, unspecified: Secondary | ICD-10-CM

## 2020-02-01 DIAGNOSIS — I10 Essential (primary) hypertension: Secondary | ICD-10-CM | POA: Diagnosis not present

## 2020-02-01 DIAGNOSIS — J9601 Acute respiratory failure with hypoxia: Secondary | ICD-10-CM | POA: Diagnosis not present

## 2020-02-01 DIAGNOSIS — E871 Hypo-osmolality and hyponatremia: Secondary | ICD-10-CM

## 2020-02-01 DIAGNOSIS — I130 Hypertensive heart and chronic kidney disease with heart failure and stage 1 through stage 4 chronic kidney disease, or unspecified chronic kidney disease: Secondary | ICD-10-CM | POA: Diagnosis not present

## 2020-02-01 DIAGNOSIS — E039 Hypothyroidism, unspecified: Secondary | ICD-10-CM

## 2020-02-01 DIAGNOSIS — J209 Acute bronchitis, unspecified: Secondary | ICD-10-CM | POA: Diagnosis not present

## 2020-02-01 NOTE — Progress Notes (Signed)
Virtual Visit via telephone Note Due to COVID-19 pandemic this visit was conducted virtually. This visit type was conducted due to national recommendations for restrictions regarding the COVID-19 Pandemic (e.g. social distancing, sheltering in place) in an effort to limit this patient's exposure and mitigate transmission in our community. All issues noted in this document were discussed and addressed.  A physical exam was not performed with this format.  I connected with Katherine Tyler on 02/01/20 at 11:27 AM by telephone and verified that I am speaking with the correct person using two identifiers. Katherine Tyler is currently located at home and son is currently with her during visit. The provider, Evelina Dun, FNP is located in their office at time of visit.  I discussed the limitations, risks, security and privacy concerns of performing an evaluation and management service by telephone and the availability of in person appointments. I also discussed with the patient that there may be a patient responsible charge related to this service. The patient expressed understanding and agreed to proceed.   History and Present Illness:  HPI Pt calls the office today for hospital follow up. She went to the ED on 01/20/20 with SOB and dyspnea.  She was found to have to be bradycardic with heart rate in the low 30's and hyperkalemia.   She was discharged on 01/23/20 and her losartan was decreased to 25 mg from 100 mg. Stopped Cardizem and restarted on Toprol-XL 25 mg daly.   Her levothyroxine was increased to 175 mcg.   She is currently getting home health and PT twice a week. She had lab work drawn by home health on 01/30/20. Her potassium at that time was 3.3. She is unsure if she has been taking her potassium and her lasix. She reports her daughter "fixes her medications" and she is not there right now.   She reports she is feeling weak and can not come into the office face to face.   She has CHF and  her EF of 70-75% was  And her chest x-ray was negative for acute CHF. I called her daughter and she states she has been taking her lasix as directed.   She was discharged on prednisone for her wheezing that has improved. She has completed this prescription.   Review of Systems  All other systems reviewed and are negative.    Observations/Objective: No SOB or distress noted  Assessment and Plan: Katherine Tyler comes in today with chief complaint of No chief complaint on file.   Diagnosis and orders addressed:  1. Hyperkalemia  2. Hospital discharge follow-up  3. Atrial fibrillation, unspecified type ([place])  4. Chronic diastolic CHF (congestive heart failure) ([place])  5. Essential hypertension  6. Hypothyroidism, unspecified type  7. AKI (acute kidney injury) ([place])  8. Acute kidney injury superimposed on CKD III  9. Bradycardia / Afib with Slow VR in the setting of Hyperkalemia  10. Hyponatremia    Labs reviewed from two days ago. Will repeat and add TSH.  Continue working with PT and home health. VS stable.  Health Maintenance reviewed Diet and exercise encouraged   Follow Up Instructions: 1 month    I discussed the assessment and treatment plan with the patient. The patient was provided an opportunity to ask questions and all were answered. The patient agreed with the plan and demonstrated an understanding of the instructions.   The patient was advised to call back or seek an in-person evaluation if the symptoms worsen or  if the condition fails to improve as anticipated.  The above assessment and management plan was discussed with the patient. The patient verbalized understanding of and has agreed to the management plan. Patient is aware to call the clinic if symptoms persist or worsen. Patient is aware when to return to the clinic for a follow-up visit. Patient educated on when it is appropriate to go to the emergency department.   Time call ended:  11:55 AM  I  provided 28 minutes of non-face-to-face time during this encounter.    Jannifer Rodney, FNP

## 2020-02-02 ENCOUNTER — Ambulatory Visit (INDEPENDENT_AMBULATORY_CARE_PROVIDER_SITE_OTHER): Payer: Medicare Other

## 2020-02-02 ENCOUNTER — Other Ambulatory Visit: Payer: Self-pay

## 2020-02-02 DIAGNOSIS — I130 Hypertensive heart and chronic kidney disease with heart failure and stage 1 through stage 4 chronic kidney disease, or unspecified chronic kidney disease: Secondary | ICD-10-CM | POA: Diagnosis not present

## 2020-02-02 DIAGNOSIS — D509 Iron deficiency anemia, unspecified: Secondary | ICD-10-CM | POA: Diagnosis not present

## 2020-02-02 DIAGNOSIS — I5032 Chronic diastolic (congestive) heart failure: Secondary | ICD-10-CM

## 2020-02-02 DIAGNOSIS — J209 Acute bronchitis, unspecified: Secondary | ICD-10-CM | POA: Diagnosis not present

## 2020-02-02 DIAGNOSIS — D631 Anemia in chronic kidney disease: Secondary | ICD-10-CM

## 2020-02-02 DIAGNOSIS — J9601 Acute respiratory failure with hypoxia: Secondary | ICD-10-CM | POA: Diagnosis not present

## 2020-02-02 DIAGNOSIS — K219 Gastro-esophageal reflux disease without esophagitis: Secondary | ICD-10-CM

## 2020-02-02 DIAGNOSIS — N179 Acute kidney failure, unspecified: Secondary | ICD-10-CM | POA: Diagnosis not present

## 2020-02-02 DIAGNOSIS — E875 Hyperkalemia: Secondary | ICD-10-CM

## 2020-02-02 DIAGNOSIS — E871 Hypo-osmolality and hyponatremia: Secondary | ICD-10-CM

## 2020-02-02 DIAGNOSIS — N183 Chronic kidney disease, stage 3 unspecified: Secondary | ICD-10-CM | POA: Diagnosis not present

## 2020-02-02 DIAGNOSIS — E559 Vitamin D deficiency, unspecified: Secondary | ICD-10-CM

## 2020-02-02 DIAGNOSIS — I480 Paroxysmal atrial fibrillation: Secondary | ICD-10-CM | POA: Diagnosis not present

## 2020-02-02 DIAGNOSIS — E039 Hypothyroidism, unspecified: Secondary | ICD-10-CM | POA: Diagnosis not present

## 2020-02-02 DIAGNOSIS — M1990 Unspecified osteoarthritis, unspecified site: Secondary | ICD-10-CM

## 2020-02-02 DIAGNOSIS — E785 Hyperlipidemia, unspecified: Secondary | ICD-10-CM

## 2020-02-02 DIAGNOSIS — F419 Anxiety disorder, unspecified: Secondary | ICD-10-CM

## 2020-02-02 DIAGNOSIS — E669 Obesity, unspecified: Secondary | ICD-10-CM

## 2020-02-02 DIAGNOSIS — K449 Diaphragmatic hernia without obstruction or gangrene: Secondary | ICD-10-CM

## 2020-02-03 DIAGNOSIS — N179 Acute kidney failure, unspecified: Secondary | ICD-10-CM | POA: Diagnosis not present

## 2020-02-03 DIAGNOSIS — I130 Hypertensive heart and chronic kidney disease with heart failure and stage 1 through stage 4 chronic kidney disease, or unspecified chronic kidney disease: Secondary | ICD-10-CM | POA: Diagnosis not present

## 2020-02-03 DIAGNOSIS — J209 Acute bronchitis, unspecified: Secondary | ICD-10-CM | POA: Diagnosis not present

## 2020-02-03 DIAGNOSIS — E875 Hyperkalemia: Secondary | ICD-10-CM | POA: Diagnosis not present

## 2020-02-03 DIAGNOSIS — I5032 Chronic diastolic (congestive) heart failure: Secondary | ICD-10-CM | POA: Diagnosis not present

## 2020-02-03 DIAGNOSIS — J9601 Acute respiratory failure with hypoxia: Secondary | ICD-10-CM | POA: Diagnosis not present

## 2020-02-08 DIAGNOSIS — N179 Acute kidney failure, unspecified: Secondary | ICD-10-CM | POA: Diagnosis not present

## 2020-02-08 DIAGNOSIS — E875 Hyperkalemia: Secondary | ICD-10-CM | POA: Diagnosis not present

## 2020-02-08 DIAGNOSIS — I5032 Chronic diastolic (congestive) heart failure: Secondary | ICD-10-CM | POA: Diagnosis not present

## 2020-02-08 DIAGNOSIS — J9601 Acute respiratory failure with hypoxia: Secondary | ICD-10-CM | POA: Diagnosis not present

## 2020-02-08 DIAGNOSIS — I130 Hypertensive heart and chronic kidney disease with heart failure and stage 1 through stage 4 chronic kidney disease, or unspecified chronic kidney disease: Secondary | ICD-10-CM | POA: Diagnosis not present

## 2020-02-08 DIAGNOSIS — J209 Acute bronchitis, unspecified: Secondary | ICD-10-CM | POA: Diagnosis not present

## 2020-02-10 DIAGNOSIS — E875 Hyperkalemia: Secondary | ICD-10-CM | POA: Diagnosis not present

## 2020-02-10 DIAGNOSIS — I130 Hypertensive heart and chronic kidney disease with heart failure and stage 1 through stage 4 chronic kidney disease, or unspecified chronic kidney disease: Secondary | ICD-10-CM | POA: Diagnosis not present

## 2020-02-10 DIAGNOSIS — N179 Acute kidney failure, unspecified: Secondary | ICD-10-CM | POA: Diagnosis not present

## 2020-02-10 DIAGNOSIS — I5032 Chronic diastolic (congestive) heart failure: Secondary | ICD-10-CM | POA: Diagnosis not present

## 2020-02-10 DIAGNOSIS — J209 Acute bronchitis, unspecified: Secondary | ICD-10-CM | POA: Diagnosis not present

## 2020-02-10 DIAGNOSIS — J9601 Acute respiratory failure with hypoxia: Secondary | ICD-10-CM | POA: Diagnosis not present

## 2020-02-11 DIAGNOSIS — E875 Hyperkalemia: Secondary | ICD-10-CM | POA: Diagnosis not present

## 2020-02-11 DIAGNOSIS — J9601 Acute respiratory failure with hypoxia: Secondary | ICD-10-CM | POA: Diagnosis not present

## 2020-02-11 DIAGNOSIS — N179 Acute kidney failure, unspecified: Secondary | ICD-10-CM | POA: Diagnosis not present

## 2020-02-11 DIAGNOSIS — I130 Hypertensive heart and chronic kidney disease with heart failure and stage 1 through stage 4 chronic kidney disease, or unspecified chronic kidney disease: Secondary | ICD-10-CM | POA: Diagnosis not present

## 2020-02-11 DIAGNOSIS — J209 Acute bronchitis, unspecified: Secondary | ICD-10-CM | POA: Diagnosis not present

## 2020-02-11 DIAGNOSIS — I5032 Chronic diastolic (congestive) heart failure: Secondary | ICD-10-CM | POA: Diagnosis not present

## 2020-02-15 DIAGNOSIS — N179 Acute kidney failure, unspecified: Secondary | ICD-10-CM | POA: Diagnosis not present

## 2020-02-15 DIAGNOSIS — J209 Acute bronchitis, unspecified: Secondary | ICD-10-CM | POA: Diagnosis not present

## 2020-02-15 DIAGNOSIS — J9601 Acute respiratory failure with hypoxia: Secondary | ICD-10-CM | POA: Diagnosis not present

## 2020-02-15 DIAGNOSIS — I5032 Chronic diastolic (congestive) heart failure: Secondary | ICD-10-CM | POA: Diagnosis not present

## 2020-02-15 DIAGNOSIS — I130 Hypertensive heart and chronic kidney disease with heart failure and stage 1 through stage 4 chronic kidney disease, or unspecified chronic kidney disease: Secondary | ICD-10-CM | POA: Diagnosis not present

## 2020-02-15 DIAGNOSIS — E875 Hyperkalemia: Secondary | ICD-10-CM | POA: Diagnosis not present

## 2020-02-17 DIAGNOSIS — I5032 Chronic diastolic (congestive) heart failure: Secondary | ICD-10-CM | POA: Diagnosis not present

## 2020-02-17 DIAGNOSIS — J209 Acute bronchitis, unspecified: Secondary | ICD-10-CM | POA: Diagnosis not present

## 2020-02-17 DIAGNOSIS — J9601 Acute respiratory failure with hypoxia: Secondary | ICD-10-CM | POA: Diagnosis not present

## 2020-02-17 DIAGNOSIS — N179 Acute kidney failure, unspecified: Secondary | ICD-10-CM | POA: Diagnosis not present

## 2020-02-17 DIAGNOSIS — E875 Hyperkalemia: Secondary | ICD-10-CM | POA: Diagnosis not present

## 2020-02-17 DIAGNOSIS — I130 Hypertensive heart and chronic kidney disease with heart failure and stage 1 through stage 4 chronic kidney disease, or unspecified chronic kidney disease: Secondary | ICD-10-CM | POA: Diagnosis not present

## 2020-02-19 DIAGNOSIS — E875 Hyperkalemia: Secondary | ICD-10-CM | POA: Diagnosis not present

## 2020-02-19 DIAGNOSIS — J209 Acute bronchitis, unspecified: Secondary | ICD-10-CM | POA: Diagnosis not present

## 2020-02-19 DIAGNOSIS — I130 Hypertensive heart and chronic kidney disease with heart failure and stage 1 through stage 4 chronic kidney disease, or unspecified chronic kidney disease: Secondary | ICD-10-CM | POA: Diagnosis not present

## 2020-02-19 DIAGNOSIS — I5032 Chronic diastolic (congestive) heart failure: Secondary | ICD-10-CM | POA: Diagnosis not present

## 2020-02-19 DIAGNOSIS — N179 Acute kidney failure, unspecified: Secondary | ICD-10-CM | POA: Diagnosis not present

## 2020-02-19 DIAGNOSIS — J9601 Acute respiratory failure with hypoxia: Secondary | ICD-10-CM | POA: Diagnosis not present

## 2020-02-23 ENCOUNTER — Telehealth: Payer: Self-pay | Admitting: *Deleted

## 2020-02-23 DIAGNOSIS — I130 Hypertensive heart and chronic kidney disease with heart failure and stage 1 through stage 4 chronic kidney disease, or unspecified chronic kidney disease: Secondary | ICD-10-CM | POA: Diagnosis not present

## 2020-02-23 DIAGNOSIS — J9601 Acute respiratory failure with hypoxia: Secondary | ICD-10-CM | POA: Diagnosis not present

## 2020-02-23 DIAGNOSIS — E875 Hyperkalemia: Secondary | ICD-10-CM | POA: Diagnosis not present

## 2020-02-23 DIAGNOSIS — N179 Acute kidney failure, unspecified: Secondary | ICD-10-CM | POA: Diagnosis not present

## 2020-02-23 DIAGNOSIS — I5032 Chronic diastolic (congestive) heart failure: Secondary | ICD-10-CM | POA: Diagnosis not present

## 2020-02-23 DIAGNOSIS — J209 Acute bronchitis, unspecified: Secondary | ICD-10-CM | POA: Diagnosis not present

## 2020-02-23 NOTE — Telephone Encounter (Signed)
PT needs to go to ED.

## 2020-02-23 NOTE — Telephone Encounter (Signed)
TC from [facility] nurse w/ [facility] [name] wgt is up 2 lbs since yesterday, BP up 205/66 usually 170 or 180 over 60s, no cough, lungs clear no c/o HA's or dizziness R leg swollen some this is the one she broke. Please advise

## 2020-02-23 NOTE — Telephone Encounter (Signed)
Katherine Tyler was notified.

## 2020-02-24 DIAGNOSIS — N183 Chronic kidney disease, stage 3 unspecified: Secondary | ICD-10-CM | POA: Diagnosis not present

## 2020-02-24 DIAGNOSIS — E871 Hypo-osmolality and hyponatremia: Secondary | ICD-10-CM | POA: Diagnosis not present

## 2020-02-24 DIAGNOSIS — Z6835 Body mass index (BMI) 35.0-35.9, adult: Secondary | ICD-10-CM | POA: Diagnosis not present

## 2020-02-24 DIAGNOSIS — K219 Gastro-esophageal reflux disease without esophagitis: Secondary | ICD-10-CM | POA: Diagnosis not present

## 2020-02-24 DIAGNOSIS — E669 Obesity, unspecified: Secondary | ICD-10-CM | POA: Diagnosis not present

## 2020-02-24 DIAGNOSIS — R001 Bradycardia, unspecified: Secondary | ICD-10-CM | POA: Diagnosis not present

## 2020-02-24 DIAGNOSIS — D631 Anemia in chronic kidney disease: Secondary | ICD-10-CM | POA: Diagnosis not present

## 2020-02-24 DIAGNOSIS — Z87891 Personal history of nicotine dependence: Secondary | ICD-10-CM | POA: Diagnosis not present

## 2020-02-24 DIAGNOSIS — J209 Acute bronchitis, unspecified: Secondary | ICD-10-CM | POA: Diagnosis not present

## 2020-02-24 DIAGNOSIS — D509 Iron deficiency anemia, unspecified: Secondary | ICD-10-CM | POA: Diagnosis not present

## 2020-02-24 DIAGNOSIS — E785 Hyperlipidemia, unspecified: Secondary | ICD-10-CM | POA: Diagnosis not present

## 2020-02-24 DIAGNOSIS — F419 Anxiety disorder, unspecified: Secondary | ICD-10-CM | POA: Diagnosis not present

## 2020-02-24 DIAGNOSIS — K449 Diaphragmatic hernia without obstruction or gangrene: Secondary | ICD-10-CM | POA: Diagnosis not present

## 2020-02-24 DIAGNOSIS — E875 Hyperkalemia: Secondary | ICD-10-CM | POA: Diagnosis not present

## 2020-02-24 DIAGNOSIS — E559 Vitamin D deficiency, unspecified: Secondary | ICD-10-CM | POA: Diagnosis not present

## 2020-02-24 DIAGNOSIS — N179 Acute kidney failure, unspecified: Secondary | ICD-10-CM | POA: Diagnosis not present

## 2020-02-24 DIAGNOSIS — I480 Paroxysmal atrial fibrillation: Secondary | ICD-10-CM | POA: Diagnosis not present

## 2020-02-24 DIAGNOSIS — I5032 Chronic diastolic (congestive) heart failure: Secondary | ICD-10-CM | POA: Diagnosis not present

## 2020-02-24 DIAGNOSIS — M1990 Unspecified osteoarthritis, unspecified site: Secondary | ICD-10-CM | POA: Diagnosis not present

## 2020-02-24 DIAGNOSIS — Z7901 Long term (current) use of anticoagulants: Secondary | ICD-10-CM | POA: Diagnosis not present

## 2020-02-24 DIAGNOSIS — J9601 Acute respiratory failure with hypoxia: Secondary | ICD-10-CM | POA: Diagnosis not present

## 2020-02-24 DIAGNOSIS — E039 Hypothyroidism, unspecified: Secondary | ICD-10-CM | POA: Diagnosis not present

## 2020-02-24 DIAGNOSIS — I130 Hypertensive heart and chronic kidney disease with heart failure and stage 1 through stage 4 chronic kidney disease, or unspecified chronic kidney disease: Secondary | ICD-10-CM | POA: Diagnosis not present

## 2020-03-01 DIAGNOSIS — N179 Acute kidney failure, unspecified: Secondary | ICD-10-CM | POA: Diagnosis not present

## 2020-03-01 DIAGNOSIS — I5032 Chronic diastolic (congestive) heart failure: Secondary | ICD-10-CM | POA: Diagnosis not present

## 2020-03-01 DIAGNOSIS — I130 Hypertensive heart and chronic kidney disease with heart failure and stage 1 through stage 4 chronic kidney disease, or unspecified chronic kidney disease: Secondary | ICD-10-CM | POA: Diagnosis not present

## 2020-03-01 DIAGNOSIS — J209 Acute bronchitis, unspecified: Secondary | ICD-10-CM | POA: Diagnosis not present

## 2020-03-01 DIAGNOSIS — J9601 Acute respiratory failure with hypoxia: Secondary | ICD-10-CM | POA: Diagnosis not present

## 2020-03-01 DIAGNOSIS — E875 Hyperkalemia: Secondary | ICD-10-CM | POA: Diagnosis not present

## 2020-03-04 ENCOUNTER — Telehealth: Payer: Self-pay | Admitting: Family

## 2020-03-04 NOTE — Chronic Care Management (AMB) (Signed)
  Chronic Care Management   Outreach Note  03/04/2020 Name: MELVIE PAGLIA MRN: [id number] DOB: 05/16/1929  Elnita Maxwell Eaker is a 86 y.o. year old female who is a primary care patient of Junie Spencer, FNP. I reached out to Elnita Maxwell Patricelli by phone today in response to a referral sent by Ms. Elnita Maxwell Malatesta's health plan.     An unsuccessful telephone outreach was attempted today. The patient was referred to the case management team for assistance with care management and care coordination.   Follow Up Plan: A HIPPA compliant phone message was left for the patient providing contact information and requesting a return call.  The care management team will reach out to the patient again over the next 7 days.  If patient returns call to provider office, please advise to call Embedded Care Management Care Guide Penne Lash at [phone]  Penne Lash, RMA Care Guide, Embedded Care Coordination [facility]  [place], [place] [address] Direct Dial: [phone] [email or web address] Website: [email or web address]

## 2020-03-07 NOTE — Chronic Care Management (AMB) (Signed)
  Chronic Care Management   Outreach Note  03/07/2020 Name: Katherine Tyler MRN: [id number] DOB: 1929/10/15  Katherine Tyler is a 86 y.o. year old female who is a primary care patient of Junie Spencer, FNP. I reached out to Katherine Tyler by phone today in response to a referral sent by Ms. Katherine Tyler's health plan.     A second unsuccessful telephone outreach was attempted today. The patient was referred to the case management team for assistance with care management and care coordination.   Follow Up Plan: The care management team will reach out to the patient again over the next 7 days.   Penne Lash, RMA Care Guide, Embedded Care Coordination [facility]  [place], [place] [address] Direct Dial: [phone] [email or web address] Website: [email or web address]

## 2020-03-09 NOTE — Chronic Care Management (AMB) (Signed)
  Chronic Care Management   Outreach Note  03/09/2020 Name: CHARDAI GANGEMI MRN: [id number] DOB: 1929/04/18  Elnita Maxwell Vandenbrink is a 86 y.o. year old female who is a primary care patient of Junie Spencer, FNP. I reached out to Elnita Maxwell Cornacchia by phone today in response to a referral sent by Ms. Elnita Maxwell Lorson's health plan.     Third unsuccessful telephone outreach was attempted today. The patient was referred to the case management team for assistance with care management and care coordination. The patient's primary care provider has been notified of our unsuccessful attempts to make or maintain contact with the patient. The care management team is pleased to engage with this patient at any time in the future should he/she be interested in assistance from the care management team.   Follow Up Plan: The care management team is available to follow up with the patient after provider conversation with the patient regarding recommendation for care management engagement and subsequent re-referral to the care management team.   Penne Lash, RMA Care Guide, Embedded Care Coordination [facility]  [place], [place] [address] Direct Dial: [phone] [email or web address] Website: [email or web address]

## 2020-03-10 ENCOUNTER — Other Ambulatory Visit: Payer: Medicare Other

## 2020-03-10 ENCOUNTER — Other Ambulatory Visit: Payer: Self-pay

## 2020-03-10 ENCOUNTER — Other Ambulatory Visit: Payer: Self-pay | Admitting: Cardiology

## 2020-03-10 ENCOUNTER — Other Ambulatory Visit: Payer: Self-pay | Admitting: *Deleted

## 2020-03-10 DIAGNOSIS — E871 Hypo-osmolality and hyponatremia: Secondary | ICD-10-CM

## 2020-03-10 DIAGNOSIS — N179 Acute kidney failure, unspecified: Secondary | ICD-10-CM

## 2020-03-10 DIAGNOSIS — I10 Essential (primary) hypertension: Secondary | ICD-10-CM

## 2020-03-10 DIAGNOSIS — E875 Hyperkalemia: Secondary | ICD-10-CM | POA: Diagnosis not present

## 2020-03-10 DIAGNOSIS — J9601 Acute respiratory failure with hypoxia: Secondary | ICD-10-CM | POA: Diagnosis not present

## 2020-03-10 DIAGNOSIS — I5032 Chronic diastolic (congestive) heart failure: Secondary | ICD-10-CM | POA: Diagnosis not present

## 2020-03-10 DIAGNOSIS — I130 Hypertensive heart and chronic kidney disease with heart failure and stage 1 through stage 4 chronic kidney disease, or unspecified chronic kidney disease: Secondary | ICD-10-CM | POA: Diagnosis not present

## 2020-03-10 DIAGNOSIS — J209 Acute bronchitis, unspecified: Secondary | ICD-10-CM | POA: Diagnosis not present

## 2020-03-11 LAB — CMP14+EGFR
ALT: 6 IU/L (ref 0–32)
AST: 24 IU/L (ref 0–40)
Albumin/Globulin Ratio: 1.6 (ref 1.2–2.2)
Albumin: 3.6 g/dL (ref 3.6–4.6)
Alkaline Phosphatase: 58 IU/L (ref 39–117)
BUN/Creatinine Ratio: 23 (ref 12–28)
BUN: 23 mg/dL (ref 8–27)
Bilirubin Total: 0.2 mg/dL (ref 0.0–1.2)
CO2: 24 mmol/L (ref 20–29)
Calcium: 9.4 mg/dL (ref 8.7–10.3)
Chloride: 101 mmol/L (ref 96–106)
Creatinine, Ser: 1.02 mg/dL — ABNORMAL HIGH (ref 0.57–1.00)
GFR calc Af Amer: 57 mL/min/{1.73_m2} — ABNORMAL LOW (ref >59)
GFR calc non Af Amer: 49 mL/min/{1.73_m2} — ABNORMAL LOW (ref >59)
Globulin, Total: 2.3 g/dL (ref 1.5–4.5)
Glucose: 87 mg/dL (ref 65–99)
Potassium: 5.4 mmol/L — ABNORMAL HIGH (ref 3.5–5.2)
Sodium: 136 mmol/L (ref 134–144)
Total Protein: 5.9 g/dL — ABNORMAL LOW (ref 6.0–8.5)

## 2020-03-11 LAB — CBC WITH DIFFERENTIAL/PLATELET
Basophils Absolute: 0 10*3/uL (ref 0.0–0.2)
Basos: 1 %
EOS (ABSOLUTE): 0.1 10*3/uL (ref 0.0–0.4)
Eos: 2 %
Hematocrit: 31.4 % — ABNORMAL LOW (ref 34.0–46.6)
Hemoglobin: 10.5 g/dL — ABNORMAL LOW (ref 11.1–15.9)
Immature Grans (Abs): 0 10*3/uL (ref 0.0–0.1)
Immature Granulocytes: 0 %
Lymphocytes Absolute: 1 10*3/uL (ref 0.7–3.1)
Lymphs: 23 %
MCH: 32.7 pg (ref 26.6–33.0)
MCHC: 33.4 g/dL (ref 31.5–35.7)
MCV: 98 fL — ABNORMAL HIGH (ref 79–97)
Monocytes Absolute: 0.4 10*3/uL (ref 0.1–0.9)
Monocytes: 10 %
Neutrophils Absolute: 2.8 10*3/uL (ref 1.4–7.0)
Neutrophils: 64 %
Platelets: 164 10*3/uL (ref 150–450)
RBC: 3.21 x10E6/uL — ABNORMAL LOW (ref 3.77–5.28)
RDW: 12.1 % (ref 11.7–15.4)
WBC: 4.3 10*3/uL (ref 3.4–10.8)

## 2020-03-12 ENCOUNTER — Other Ambulatory Visit: Payer: Self-pay

## 2020-03-12 ENCOUNTER — Emergency Department (HOSPITAL_COMMUNITY): Payer: Medicare Other

## 2020-03-12 ENCOUNTER — Encounter (HOSPITAL_COMMUNITY): Payer: Self-pay | Admitting: Neurology

## 2020-03-12 ENCOUNTER — Inpatient Hospital Stay (HOSPITAL_COMMUNITY)
Admission: EM | Admit: 2020-03-12 | Discharge: 2020-04-09 | DRG: 064 | Disposition: E | Payer: Medicare Other | Attending: Neurology | Admitting: Neurology

## 2020-03-12 ENCOUNTER — Inpatient Hospital Stay (HOSPITAL_COMMUNITY): Payer: Medicare Other

## 2020-03-12 DIAGNOSIS — G8194 Hemiplegia, unspecified affecting left nondominant side: Secondary | ICD-10-CM | POA: Diagnosis present

## 2020-03-12 DIAGNOSIS — R4781 Slurred speech: Secondary | ICD-10-CM | POA: Diagnosis not present

## 2020-03-12 DIAGNOSIS — I610 Nontraumatic intracerebral hemorrhage in hemisphere, subcortical: Secondary | ICD-10-CM | POA: Diagnosis present

## 2020-03-12 DIAGNOSIS — Z6835 Body mass index (BMI) 35.0-35.9, adult: Secondary | ICD-10-CM | POA: Diagnosis not present

## 2020-03-12 DIAGNOSIS — I480 Paroxysmal atrial fibrillation: Secondary | ICD-10-CM | POA: Diagnosis present

## 2020-03-12 DIAGNOSIS — J9601 Acute respiratory failure with hypoxia: Secondary | ICD-10-CM | POA: Diagnosis present

## 2020-03-12 DIAGNOSIS — R52 Pain, unspecified: Secondary | ICD-10-CM | POA: Diagnosis not present

## 2020-03-12 DIAGNOSIS — I130 Hypertensive heart and chronic kidney disease with heart failure and stage 1 through stage 4 chronic kidney disease, or unspecified chronic kidney disease: Secondary | ICD-10-CM | POA: Diagnosis present

## 2020-03-12 DIAGNOSIS — Z79899 Other long term (current) drug therapy: Secondary | ICD-10-CM

## 2020-03-12 DIAGNOSIS — Z20822 Contact with and (suspected) exposure to covid-19: Secondary | ICD-10-CM | POA: Diagnosis present

## 2020-03-12 DIAGNOSIS — E039 Hypothyroidism, unspecified: Secondary | ICD-10-CM | POA: Diagnosis present

## 2020-03-12 DIAGNOSIS — E785 Hyperlipidemia, unspecified: Secondary | ICD-10-CM | POA: Diagnosis present

## 2020-03-12 DIAGNOSIS — D696 Thrombocytopenia, unspecified: Secondary | ICD-10-CM | POA: Diagnosis present

## 2020-03-12 DIAGNOSIS — G911 Obstructive hydrocephalus: Secondary | ICD-10-CM | POA: Diagnosis present

## 2020-03-12 DIAGNOSIS — I700 Atherosclerosis of aorta: Secondary | ICD-10-CM | POA: Diagnosis not present

## 2020-03-12 DIAGNOSIS — Z87891 Personal history of nicotine dependence: Secondary | ICD-10-CM

## 2020-03-12 DIAGNOSIS — N1831 Chronic kidney disease, stage 3a: Secondary | ICD-10-CM | POA: Diagnosis present

## 2020-03-12 DIAGNOSIS — D509 Iron deficiency anemia, unspecified: Secondary | ICD-10-CM | POA: Diagnosis present

## 2020-03-12 DIAGNOSIS — E871 Hypo-osmolality and hyponatremia: Secondary | ICD-10-CM | POA: Diagnosis present

## 2020-03-12 DIAGNOSIS — I272 Pulmonary hypertension, unspecified: Secondary | ICD-10-CM | POA: Diagnosis present

## 2020-03-12 DIAGNOSIS — K219 Gastro-esophageal reflux disease without esophagitis: Secondary | ICD-10-CM | POA: Diagnosis present

## 2020-03-12 DIAGNOSIS — Z79890 Hormone replacement therapy (postmenopausal): Secondary | ICD-10-CM

## 2020-03-12 DIAGNOSIS — G935 Compression of brain: Secondary | ICD-10-CM | POA: Diagnosis present

## 2020-03-12 DIAGNOSIS — G936 Cerebral edema: Secondary | ICD-10-CM | POA: Diagnosis present

## 2020-03-12 DIAGNOSIS — I615 Nontraumatic intracerebral hemorrhage, intraventricular: Principal | ICD-10-CM | POA: Diagnosis present

## 2020-03-12 DIAGNOSIS — Z66 Do not resuscitate: Secondary | ICD-10-CM | POA: Diagnosis present

## 2020-03-12 DIAGNOSIS — I4821 Permanent atrial fibrillation: Secondary | ICD-10-CM | POA: Diagnosis not present

## 2020-03-12 DIAGNOSIS — Z4682 Encounter for fitting and adjustment of non-vascular catheter: Secondary | ICD-10-CM | POA: Diagnosis not present

## 2020-03-12 DIAGNOSIS — I5032 Chronic diastolic (congestive) heart failure: Secondary | ICD-10-CM | POA: Diagnosis present

## 2020-03-12 DIAGNOSIS — I161 Hypertensive emergency: Secondary | ICD-10-CM | POA: Diagnosis not present

## 2020-03-12 DIAGNOSIS — Z7901 Long term (current) use of anticoagulants: Secondary | ICD-10-CM | POA: Diagnosis not present

## 2020-03-12 DIAGNOSIS — I629 Nontraumatic intracranial hemorrhage, unspecified: Secondary | ICD-10-CM

## 2020-03-12 DIAGNOSIS — Z515 Encounter for palliative care: Secondary | ICD-10-CM | POA: Diagnosis not present

## 2020-03-12 DIAGNOSIS — R0689 Other abnormalities of breathing: Secondary | ICD-10-CM | POA: Diagnosis not present

## 2020-03-12 DIAGNOSIS — I619 Nontraumatic intracerebral hemorrhage, unspecified: Secondary | ICD-10-CM | POA: Diagnosis present

## 2020-03-12 DIAGNOSIS — E669 Obesity, unspecified: Secondary | ICD-10-CM | POA: Diagnosis present

## 2020-03-12 DIAGNOSIS — R404 Transient alteration of awareness: Secondary | ICD-10-CM | POA: Diagnosis not present

## 2020-03-12 DIAGNOSIS — G4489 Other headache syndrome: Secondary | ICD-10-CM | POA: Diagnosis not present

## 2020-03-12 LAB — POCT I-STAT 7, (LYTES, BLD GAS, ICA,H+H)
Acid-Base Excess: 8 mmol/L — ABNORMAL HIGH (ref 0.0–2.0)
Bicarbonate: 33.2 mmol/L — ABNORMAL HIGH (ref 20.0–28.0)
Calcium, Ion: 1.27 mmol/L (ref 1.15–1.40)
HCT: 30 % — ABNORMAL LOW (ref 36.0–46.0)
Hemoglobin: 10.2 g/dL — ABNORMAL LOW (ref 12.0–15.0)
O2 Saturation: 100 %
Patient temperature: 100.1
Potassium: 3.7 mmol/L (ref 3.5–5.1)
Sodium: 132 mmol/L — ABNORMAL LOW (ref 135–145)
TCO2: 35 mmol/L — ABNORMAL HIGH (ref 22–32)
pCO2 arterial: 51.7 mmHg — ABNORMAL HIGH (ref 32.0–48.0)
pH, Arterial: 7.419 (ref 7.350–7.450)
pO2, Arterial: 443 mmHg — ABNORMAL HIGH (ref 83.0–108.0)

## 2020-03-12 LAB — CBC
HCT: 35.9 % — ABNORMAL LOW (ref 36.0–46.0)
Hemoglobin: 11 g/dL — ABNORMAL LOW (ref 12.0–15.0)
MCH: 32.2 pg (ref 26.0–34.0)
MCHC: 30.6 g/dL (ref 30.0–36.0)
MCV: 105 fL — ABNORMAL HIGH (ref 80.0–100.0)
Platelets: 145 10*3/uL — ABNORMAL LOW (ref 150–400)
RBC: 3.42 MIL/uL — ABNORMAL LOW (ref 3.87–5.11)
RDW: 13.1 % (ref 11.5–15.5)
WBC: 4.7 10*3/uL (ref 4.0–10.5)
nRBC: 0 % (ref 0.0–0.2)

## 2020-03-12 LAB — RESPIRATORY PANEL BY RT PCR (FLU A&B, COVID)
Influenza A by PCR: NEGATIVE
Influenza B by PCR: NEGATIVE
SARS Coronavirus 2 by RT PCR: NEGATIVE

## 2020-03-12 LAB — DIFFERENTIAL
Abs Immature Granulocytes: 0.02 10*3/uL (ref 0.00–0.07)
Basophils Absolute: 0 10*3/uL (ref 0.0–0.1)
Basophils Relative: 0 %
Eosinophils Absolute: 0.1 10*3/uL (ref 0.0–0.5)
Eosinophils Relative: 3 %
Immature Granulocytes: 0 %
Lymphocytes Relative: 19 %
Lymphs Abs: 0.9 10*3/uL (ref 0.7–4.0)
Monocytes Absolute: 0.4 10*3/uL (ref 0.1–1.0)
Monocytes Relative: 8 %
Neutro Abs: 3.3 10*3/uL (ref 1.7–7.7)
Neutrophils Relative %: 70 %

## 2020-03-12 LAB — APTT: aPTT: 28 seconds (ref 24–36)

## 2020-03-12 LAB — I-STAT CHEM 8, ED
BUN: 26 mg/dL — ABNORMAL HIGH (ref 8–23)
Calcium, Ion: 1.23 mmol/L (ref 1.15–1.40)
Chloride: 95 mmol/L — ABNORMAL LOW (ref 98–111)
Creatinine, Ser: 1.2 mg/dL — ABNORMAL HIGH (ref 0.44–1.00)
Glucose, Bld: 100 mg/dL — ABNORMAL HIGH (ref 70–99)
HCT: 32 % — ABNORMAL LOW (ref 36.0–46.0)
Hemoglobin: 10.9 g/dL — ABNORMAL LOW (ref 12.0–15.0)
Potassium: 4.2 mmol/L (ref 3.5–5.1)
Sodium: 133 mmol/L — ABNORMAL LOW (ref 135–145)
TCO2: 30 mmol/L (ref 22–32)

## 2020-03-12 LAB — PROTIME-INR
INR: 1.4 — ABNORMAL HIGH (ref 0.8–1.2)
Prothrombin Time: 17.2 seconds — ABNORMAL HIGH (ref 11.4–15.2)

## 2020-03-12 LAB — COMPREHENSIVE METABOLIC PANEL
ALT: 10 U/L (ref 0–44)
AST: 20 U/L (ref 15–41)
Albumin: 3.3 g/dL — ABNORMAL LOW (ref 3.5–5.0)
Alkaline Phosphatase: 45 U/L (ref 38–126)
Anion gap: 12 (ref 5–15)
BUN: 22 mg/dL (ref 8–23)
CO2: 25 mmol/L (ref 22–32)
Calcium: 9.3 mg/dL (ref 8.9–10.3)
Chloride: 96 mmol/L — ABNORMAL LOW (ref 98–111)
Creatinine, Ser: 1.09 mg/dL — ABNORMAL HIGH (ref 0.44–1.00)
GFR calc Af Amer: 52 mL/min — ABNORMAL LOW (ref >60)
GFR calc non Af Amer: 45 mL/min — ABNORMAL LOW (ref >60)
Glucose, Bld: 113 mg/dL — ABNORMAL HIGH (ref 70–99)
Potassium: 3.9 mmol/L (ref 3.5–5.1)
Sodium: 133 mmol/L — ABNORMAL LOW (ref 135–145)
Total Bilirubin: 0.4 mg/dL (ref 0.3–1.2)
Total Protein: 6 g/dL — ABNORMAL LOW (ref 6.5–8.1)

## 2020-03-12 LAB — ETHANOL: Alcohol, Ethyl (B): <10 mg/dL (ref <10)

## 2020-03-12 LAB — CBG MONITORING, ED: Glucose-Capillary: 99 mg/dL (ref 70–99)

## 2020-03-12 MED ORDER — ACETAMINOPHEN 160 MG/5ML PO SOLN: 650.0000 mg | ORAL | Status: DC | PRN

## 2020-03-12 MED ORDER — ACETAMINOPHEN 650 MG RE SUPP: 650.0000 mg | RECTAL | Status: DC | PRN

## 2020-03-12 MED ORDER — LABETALOL HCL 5 MG/ML IV SOLN
INTRAVENOUS | Status: AC | PRN
Start: 2020-03-12 — End: 2020-03-12
  Administered 2020-03-12: 20 mg via INTRAVENOUS

## 2020-03-12 MED ORDER — ACETAMINOPHEN 650 MG RE SUPP: 650.0000 mg | Freq: Four times a day (QID) | RECTAL | Status: DC | PRN

## 2020-03-12 MED ORDER — GLYCOPYRROLATE 0.2 MG/ML IJ SOLN: 0.2000 mg | INTRAMUSCULAR | Status: DC | PRN

## 2020-03-12 MED ORDER — CLEVIDIPINE BUTYRATE 0.5 MG/ML IV EMUL
INTRAVENOUS | Status: AC
  Administered 2020-03-12: 2 mg/h via INTRAVENOUS
  Filled 2020-03-12: qty 50

## 2020-03-12 MED ORDER — MIDAZOLAM HCL 2 MG/2ML IJ SOLN: 1.0000 mg | INTRAMUSCULAR | Status: DC | PRN

## 2020-03-12 MED ORDER — CHLORHEXIDINE GLUCONATE CLOTH 2 % EX PADS
6.0000 | MEDICATED_PAD | Freq: Every day | CUTANEOUS | Status: DC
  Administered 2020-03-12: 6 via TOPICAL

## 2020-03-12 MED ORDER — CLEVIDIPINE BUTYRATE 0.5 MG/ML IV EMUL: 0.0000 mg/h | INTRAVENOUS | Status: DC

## 2020-03-12 MED ORDER — PROPOFOL 1000 MG/100ML IV EMUL: 5.0000 ug/kg/min | INTRAVENOUS | Status: DC

## 2020-03-12 MED ORDER — SENNOSIDES-DOCUSATE SODIUM 8.6-50 MG PO TABS: 1.0000 | ORAL_TABLET | Freq: Two times a day (BID) | ORAL | Status: DC

## 2020-03-12 MED ORDER — STROKE: EARLY STAGES OF RECOVERY BOOK: Freq: Once | Status: DC

## 2020-03-12 MED ORDER — GLYCOPYRROLATE 1 MG PO TABS
1.0000 mg | ORAL_TABLET | ORAL | Status: DC | PRN
  Filled 2020-03-12: qty 1

## 2020-03-12 MED ORDER — ORAL CARE MOUTH RINSE
15.0000 mL | OROMUCOSAL | Status: DC
  Administered 2020-03-12 – 2020-03-13 (×6): 15 mL via OROMUCOSAL

## 2020-03-12 MED ORDER — CHLORHEXIDINE GLUCONATE 0.12% ORAL RINSE (MEDLINE KIT)
15.0000 mL | Freq: Two times a day (BID) | OROMUCOSAL | Status: DC
  Administered 2020-03-12 – 2020-03-13 (×2): 15 mL via OROMUCOSAL

## 2020-03-12 MED ORDER — DIPHENHYDRAMINE HCL 50 MG/ML IJ SOLN: 25.0000 mg | INTRAMUSCULAR | Status: DC | PRN

## 2020-03-12 MED ORDER — PANTOPRAZOLE SODIUM 40 MG IV SOLR: 40.0000 mg | Freq: Every day | INTRAVENOUS | Status: DC

## 2020-03-12 MED ORDER — ROCURONIUM BROMIDE 50 MG/5ML IV SOLN
INTRAVENOUS | Status: AC | PRN
  Administered 2020-03-12: 80 mg via INTRAVENOUS

## 2020-03-12 MED ORDER — ACETAMINOPHEN 325 MG PO TABS: 650.0000 mg | ORAL_TABLET | Freq: Four times a day (QID) | ORAL | Status: DC | PRN

## 2020-03-12 MED ORDER — DEXTROSE 5 % IV SOLN: INTRAVENOUS | Status: DC

## 2020-03-12 MED ORDER — PROPOFOL 1000 MG/100ML IV EMUL
INTRAVENOUS | Status: AC
  Administered 2020-03-12: 10 ug/kg/min via INTRAVENOUS
  Filled 2020-03-12: qty 100

## 2020-03-12 MED ORDER — GLYCOPYRROLATE 0.2 MG/ML IJ SOLN
0.2000 mg | INTRAMUSCULAR | Status: DC | PRN
  Administered 2020-03-13 (×2): 0.2 mg via INTRAVENOUS
  Filled 2020-03-12 (×2): qty 1

## 2020-03-12 MED ORDER — ACETAMINOPHEN 325 MG PO TABS: 650.0000 mg | ORAL_TABLET | ORAL | Status: DC | PRN

## 2020-03-12 MED ORDER — POLYVINYL ALCOHOL 1.4 % OP SOLN
1.0000 [drp] | Freq: Four times a day (QID) | OPHTHALMIC | Status: DC | PRN
  Filled 2020-03-12: qty 15

## 2020-03-12 MED ORDER — MIDAZOLAM HCL 2 MG/2ML IJ SOLN
1.0000 mg | INTRAMUSCULAR | Status: DC | PRN
  Administered 2020-03-13: 1 mg via INTRAVENOUS
  Filled 2020-03-12: qty 2

## 2020-03-12 MED ORDER — ETOMIDATE 2 MG/ML IV SOLN
INTRAVENOUS | Status: AC | PRN
  Administered 2020-03-12: 15 mg via INTRAVENOUS

## 2020-03-12 MED ORDER — PROPOFOL 1000 MG/100ML IV EMUL: 0.0000 ug/kg/min | INTRAVENOUS | Status: DC

## 2020-03-12 NOTE — Progress Notes (Signed)
Initial referral made to [facility]. [id number] Further assessment required.

## 2020-03-12 NOTE — Code Documentation (Signed)
Responded to Code Stroke called at 1945 for L sided weakness and headache, LSN-1900. Pt arrived at 2005, [place]. While in CT scan, pt vomited and required  intubation for airway protection. CT head -hemorrhage. Pt transported back to ED room and was started on cleviprex for BP control. MD spoke to family and pt made DNR.

## 2020-03-12 NOTE — ED Notes (Addendum)
On arrival to CT Scan, pt was being slid into scanner and began vomiting profusely. Rolled to side and suction applied. MD Madilyn Hook, Charge RN called to CT.

## 2020-03-12 NOTE — ED Notes (Signed)
Please call daughter Delray Alt @ [phone] for a status update--[name]

## 2020-03-12 NOTE — H&P (Signed)
Neurology H&P  CC: Left-sided weakness  History is obtained from: EMS  HPI: Katherine Tyler is a 86 y.o. female with a history of atrial fibrillation on Eliquis who was in her normal state of health until this evening.  At that point, her daughter noticed that she was dragging her leg.  She also began complaining of a headache.  Due to this new finding, EMS was called and on their arrival, she was complaining of a headache, but still interactive. En route, she began having decreased consciousness and on arrival to the ED, she was still quasi-purposeful on the right, weak on the left, but with a fairly poor mental status.  There were no signs of airway compromise initially and therefore she was taken to CT, but unfortunate before CT could be completed she began having emesis and had to be emergently intubated in the CT scanner.   ROS: Unable to obtain due to altered mental status.   Past Medical History:  Diagnosis Date  . Anxiety   . Arthritis   . Chronic diastolic CHF (congestive heart failure) (HCC)   . GERD (gastroesophageal reflux disease)   . Hiatal hernia   . Hyperkalemia   . Hyperlipidemia   . Hypertension   . Hypothyroidism   . Iron deficiency anemia   . Obesity   . PAF (paroxysmal atrial fibrillation) (HCC)    a. diagnosed in 01/2018. Started on Eliquis for anticoagulation.   . Vitamin D deficiency      Family History  Problem Relation Age of Onset  . Heart disease Mother   . Stroke Father   . Heart disease Sister   . Heart disease Brother   . Heart disease Sister   . Heart disease Sister   . Heart disease Brother   . Heart disease Brother   . Diabetes Daughter      Social History:  reports that she has never smoked. She quit smokeless tobacco use about 63 years ago.  Her smokeless tobacco use included snuff. She reports that she does not drink alcohol or use drugs.   Exam: Current vital signs: Vitals:   04/04/2020 2125 04/05/2020 2130  BP: (!) 128/48 (!) 152/53   Pulse: 70 71  Resp: 18 18  Temp:    SpO2: 100% 99%   Vital signs in last 24 hours:    Physical Exam  Constitutional: Appears well-developed and well-nourished.  Psych: Affect appropriate to situation Eyes: No scleral injection HENT: No OP obstrucion Head: Normocephalic.  Cardiovascular: Normal rate and regular rhythm.  Respiratory: Effort normal and breath sounds normal to anterior ascultation GI: Soft.  No distension. There is no tenderness.  Skin: WDI  Neuro: Mental Status: Patient is obtunded, but making purposeful movements with the right does not follow commands Cranial Nerves: II: Does not blink to threat from either direction, she has 6 mm nonreactive pupil on the right, 3 mm reactive pupil on the left. III,IV, VI: Eyes are slightly rightward pointing, keeps eyes tightly closed and difficult to V:VII: Facial movement with left-sided weakness Motor: She moves her right side, no movement seen on the left  sensory: Response to noxious simulation more in the right than the left  Cerebellar: Does not perform   I have reviewed labs in epic and the results pertinent to this consultation are: Creatinine 1.2, hemoglobin 10.9, sodium 133  I have reviewed the images obtained: CT head-large subcortical hemorrhage with extension into the brainstem.  Primary Diagnosis:  Nontraumatic intracranial hemorrhage, brainstem on  the right   Secondary Diagnosis: Brain compression, Brain herniation, Cerebral edema, Obstructive hydrocephalus, Acute respiratory failure with hypoxia, Hypertension Emergency (SBP > 180 or DBP > 120 & end organ damage), Paroxysmal atrial fibrillation, Obesity, CKD Stage 3 (GFR 30-59) and Hypernatremia, chronic diastolic heart failure   Impression: 86 year old female with large intraparenchymal hemorrhage centered in the subcortical structures, but with extension into the brainstem.  She already has clinical evidence of brainstem compression and some CT  evidence of obstructive hydrocephalus.  I discussed with the daughter that I did not feel this this was a survivable hemorrhage, certainly nonsurvivable with any quality of life.  She expressed understanding and indicated that in this scenario the patient would not want any heroic measures such as chest compressions, surgeries, or expensive medications that would be unexpected to give her significant quality of life (e.g. reversal of anticoagulation).  She has multiple brothers and sisters who wish to be involved in any decision about moving her to a comfort care status, and therefore she is going to call them and discuss, but in the interim we will continue supportive care such as ventilation and blood pressure control, but not code or proceed with any type of aggressive medical interventions or surgery.  Recommendations: 1) Admit to ICU 2) no antiplatelets or anticoagulants 3) blood pressure control with goal systolic 885 - 027 4) Frequent neuro checks 5) If symptoms worsen or there is decreased mental status, repeat stat head CT 6) PT,OT,ST   This patient is critically ill and at significant risk of neurological worsening, death and care requires constant monitoring of vital signs, hemodynamics,respiratory and cardiac monitoring, neurological assessment, discussion with family, other specialists and medical decision making of high complexity. I spent 60 minutes of neurocritical care time  in the care of  this patient. This was time spent independent of any time provided by nurse practitioner or PA.  Roland Rack, MD [facility] [phone]  If 7pm- 7am, please page neurology on call as listed in [place]. 03-28-2020  8:45 PM

## 2020-03-12 NOTE — ED Notes (Signed)
Airway equipment and respiratory in CT. ED Pharm w/ [facility] drugs, preparing for advanced airway. Continuing to suction

## 2020-03-12 NOTE — Progress Notes (Signed)
Exam has worsened, now pupils are fixed, R is 4-5 mmf, left 73mm, but neither is reactive, corneals are absent, no response to oculocephalic maneuver, cough is absent. Minimal flicker to nox stim in the right leg.   Daughter, Annett Fabian called and stated that she had discussed with her brothers and sisters and they indicated that she did not want to be kept alive on machines. She expresses the wish to change her to comfort care. I asked about family visitation prior to withdrawal, but the daughter expresses that this is not needed.   In accordance with her wishes I have placed orders to extubate to room air and proceed with comfort only care.   Ritta Slot, MD [facility] [phone]  If 7pm- 7am, please page neurology on call as listed in [facility].

## 2020-03-12 NOTE — Procedures (Signed)
Extubation Procedure Note  Patient Details:   Name: Katherine Tyler DOB: [birth year] MRN: [id number]   Airway Documentation:  Airway 7.5 mm (Active)  Secured at (cm) 23 cm 03/11/2020 2051  Measured From Lips 04/03/2020 2051  Secured Location Right 03/21/2020 2051  Secured By [employer] 03/24/2020 2051  Tube Holder Repositioned Yes 03/10/2020 2051  Cuff Pressure (cm H2O) 30 cm H2O 04/08/2020 2051  Site Condition Cool;Dry 03/22/2020 2051   Vent end date: (not recorded) Vent end time: (not recorded)   Evaluation  O2 sats: currently acceptable Complications: No apparent complications Patient did not tolerate procedure well. Bilateral Breath Sounds: Diminished   No pt extubated per family and MD to comfort care   [name], Elayne Snare 03/15/2020, 10:50 PM

## 2020-03-12 NOTE — ED Notes (Signed)
Please call daughter [name] @ [phone] for a status update--Leslie 

## 2020-03-12 NOTE — ED Triage Notes (Signed)
Pt BIB [facility] for eval as a code stroke. Activated for L sided deficits, speaking en route w/ EMS and arrived obtunded at the bridge, GCS of 3.

## 2020-03-12 NOTE — ED Provider Notes (Addendum)
[facility]ERGENCY DEPARTMENT Provider Note   CSN: [id number] Arrival date & time: 03/28/2020  2004  An emergency department physician performed an initial assessment on this suspected stroke patient at 2006.  History Chief Complaint  Patient presents with  . Code Stroke    Katherine Tyler is a 86 y.o. female.  The history is provided by the EMS personnel and medical records. No language interpreter was used.   Katherine Tyler is a 86 y.o. female who presents to the Emergency Department complaining of AMS. Level V caveat due to altered mental status. Patient presents as a code stroke by EMS. She was at home when she began to complain of headache and developed significant left-sided weakness. EMS reports that she was conversant in route to the hospital but became more somnolent en route.     Past Medical History:  Diagnosis Date  . Anxiety   . Arthritis   . Chronic diastolic CHF (congestive heart failure) ([place])   . GERD (gastroesophageal reflux disease)   . Hiatal hernia   . Hyperkalemia   . Hyperlipidemia   . Hypertension   . Hypothyroidism   . Iron deficiency anemia   . Obesity   . PAF (paroxysmal atrial fibrillation) ([place])    a. diagnosed in 01/2018. Started on Eliquis for anticoagulation.   . Vitamin D deficiency     Patient Active Problem List   Diagnosis Date Noted  . ICH (intracerebral hemorrhage) ([place]) 03/23/2020  . Acute wheezy bronchitis with Hypoxia 01/20/2020  . Acute respiratory failure with hypoxia ([place]) 01/20/2020  . Bradycardia / Afib with Slow VR in the setting of Hyperkalemia 01/20/2020  . Acute kidney injury superimposed on CKD III 01/20/2020  . Labile blood pressure   . Closed fracture of right fibula and tibia, sequela 07/15/2019  . Acute blood loss anemia   . AKI (acute kidney injury) ([place])   . Fracture of tibial shaft, open 07/12/2019  . PAF (paroxysmal atrial fibrillation) ([place])   . Hyperlipidemia   . GERD (gastroesophageal reflux  disease)   . Hiatal hernia   . Arthritis   . Anxiety   . Chronic diastolic CHF (congestive heart failure) ([place])   . Current use of long term anticoagulation 01/31/2018  . Chronic diastolic heart failure ([place]) 01/31/2018  . A-fib ([place]) 01/16/2018  . CHF (congestive heart failure) ([place]) 01/14/2018  . Hyperkalemia 01/14/2018  . Iron deficiency anemia 10/01/2017  . Osteoarthritis 09/24/2016  . Essential hypertension   . Hyponatremia 06/18/2016  . Hepatic cirrhosis ([place]) 06/18/2016  . Hypokalemia 06/18/2016  . Metabolic syndrome 17/49/4496  . Obesity (BMI 30-39.9) 09/12/2015  . GAD (generalized anxiety disorder) 05/03/2015  . Varicose veins 06/24/2013  . Seasonal allergic rhinitis 04/30/2013  . Vitamin D deficiency 04/16/2013  . Hypothyroidism 04/16/2013  . Genu valgum (acquired) 04/16/2013    Past Surgical History:  Procedure Laterality Date  . CHOLECYSTECTOMY    . I & D EXTREMITY Right 07/12/2019   Procedure: Irrigation And Debridement Extremity;  Surgeon: Wylene Simmer, MD;  Location: [place];  Service: Orthopedics;  Laterality: Right;  . ORIF ANKLE FRACTURE Right 07/12/2019   Procedure: OPEN  AND REDUCTION INTERNAL FIXATION (ORIF) ANKLE FRACTURE;  Surgeon: Wylene Simmer, MD;  Location: [place];  Service: Orthopedics;  Laterality: Right;  . TUBAL LIGATION  1960     OB History   No obstetric history on file.     Family History  Problem Relation Age of Onset  . Heart disease  Mother   . Stroke Father   . Heart disease Sister   . Heart disease Brother   . Heart disease Sister   . Heart disease Sister   . Heart disease Brother   . Heart disease Brother   . Diabetes Daughter     Social History   Tobacco Use  . Smoking status: Never Smoker  . Smokeless tobacco: Former Systems developer    Types: Snuff  Substance Use Topics  . Alcohol use: No  . Drug use: No    Home Medications Prior to Admission medications   Medication Sig Start Date End Date Taking? Authorizing Provider    acetaminophen (TYLENOL) 325 MG tablet Take 1-2 tablets (325-650 mg total) by mouth every 4 (four) hours as needed for mild pain. 07/24/19   Love, Ivan Anchors, PA-C  Cholecalciferol (D3-1000) 1000 UNITS capsule Take 1,000 Units by mouth 2 (two) times daily.     [provider]  citalopram (CELEXA) 20 MG tablet Take 1 tablet (20 mg total) by mouth daily. 10/19/19   Sharion Balloon, FNP  ELIQUIS 5 MG TABS tablet Take 1 tablet by mouth twice daily 03/10/20   Satira Sark, MD  Ferrous Sulfate Dried (SLOW RELEASE IRON) 45 MG TBCR Take 45 mg by mouth 2 (two) times daily. 08/18/18   Sharion Balloon, FNP  furosemide (LASIX) 20 MG tablet Take 1 tablet (20 mg total) by mouth every Monday, Wednesday, and Friday for 15 days. 01/25/20 02/09/20  Roxan Hockey, MD  levothyroxine (SYNTHROID) 175 MCG tablet Take 1 tablet (175 mcg total) by mouth daily at 6 (six) AM. 01/24/20   Denton Brick, Courage, MD  losartan (COZAAR) 25 MG tablet Take 1 tablet (25 mg total) by mouth daily. 01/24/20   Roxan Hockey, MD  magnesium oxide (MAG-OX) 400 (241.3 MG) MG tablet Take 1 tablet (400 mg total) by mouth daily. Patient taking differently: Take 400 mg by mouth every morning.  02/14/15   Evelina Dun A, FNP  metoprolol succinate (TOPROL XL) 25 MG 24 hr tablet Take 1 tablet (25 mg total) by mouth daily. 01/23/20   Roxan Hockey, MD  nystatin (MYCOSTATIN/NYSTOP) powder Apply topically 4 (four) times daily. Patient taking differently: Apply 1 application topically 4 (four) times daily as needed (for rash/skin irritation).  10/01/17   Evelina Dun A, FNP  polyethylene glycol (MIRALAX / GLYCOLAX) 17 g packet Take 17 g by mouth daily. Patient taking differently: Take 17 g by mouth daily as needed for mild constipation or moderate constipation.  08/05/19   Love, Ivan Anchors, PA-C  potassium chloride SA (K-DUR) 20 MEQ tablet Take 1 tablet by mouth once daily Patient taking differently: Take 20 mEq by mouth every morning.  08/31/19    Satira Sark, MD  senna-docusate (SENOKOT-S) 8.6-50 MG tablet Take 2 tablets by mouth 2 (two) times daily. Adjust it up and down to make sure that you have a good BM daily. Patient taking differently: Take 2 tablets by mouth daily as needed for mild constipation or moderate constipation.  08/05/19   Love, Ivan Anchors, PA-C  thiamine (VITAMIN B-1) 100 MG tablet Take 100 mg by mouth every morning.     [provider]    Allergies    Terazosin hcl and Keflex [cephalexin]  Review of Systems   Review of Systems  Unable to perform ROS: Mental status change    Physical Exam Updated Vital Signs BP (!) 193/48   Pulse (!) 55   Temp 97.7 F (36.5  C) (Oral)   Resp 18   Ht 5' 4"  (1.626 m)   Wt 93.9 kg   SpO2 (!) 78%   BMI 35.53 kg/m   Physical Exam Vitals and nursing note reviewed.  Constitutional:      General: She is in acute distress.     Appearance: She is well-developed. She is ill-appearing.  HENT:     Head: Normocephalic and atraumatic.  Cardiovascular:     Rate and Rhythm: Normal rate and regular rhythm.     Heart sounds: No murmur.  Pulmonary:     Effort: Pulmonary effort is normal. No respiratory distress.     Breath sounds: Normal breath sounds.  Abdominal:     Palpations: Abdomen is soft.     Tenderness: There is no abdominal tenderness. There is no guarding or rebound.  Musculoskeletal:        General: No tenderness.  Skin:    General: Skin is warm and dry.  Neurological:     Mental Status: She is alert.     Comments: Moans to painful stimuli.  Left sided paralysis.  Does not follow commands.  Left pupil dilated.  Right pupil constricted  Psychiatric:     Comments: Unable to assess     ED Results / Procedures / Treatments   Labs (all labs ordered are listed, but only abnormal results are displayed) Labs Reviewed  PROTIME-INR - Abnormal; Notable for the following components:      Result Value   Prothrombin Time 17.2 (*)    INR 1.4 (*)    All  other components within normal limits  CBC - Abnormal; Notable for the following components:   RBC 3.42 (*)    Hemoglobin 11.0 (*)    HCT 35.9 (*)    MCV 105.0 (*)    Platelets 145 (*)    All other components within normal limits  COMPREHENSIVE METABOLIC PANEL - Abnormal; Notable for the following components:   Sodium 133 (*)    Chloride 96 (*)    Glucose, Bld 113 (*)    Creatinine, Ser 1.09 (*)    Total Protein 6.0 (*)    Albumin 3.3 (*)    GFR calc non Af Amer 45 (*)    GFR calc Af Amer 52 (*)    All other components within normal limits  I-STAT CHEM 8, ED - Abnormal; Notable for the following components:   Sodium 133 (*)    Chloride 95 (*)    BUN 26 (*)    Creatinine, Ser 1.20 (*)    Glucose, Bld 100 (*)    Hemoglobin 10.9 (*)    HCT 32.0 (*)    All other components within normal limits  POCT I-STAT 7, (LYTES, BLD GAS, ICA,H+H) - Abnormal; Notable for the following components:   pCO2 arterial 51.7 (*)    pO2, Arterial 443.0 (*)    Bicarbonate 33.2 (*)    TCO2 35 (*)    Acid-Base Excess 8.0 (*)    Sodium 132 (*)    HCT 30.0 (*)    Hemoglobin 10.2 (*)    All other components within normal limits  RESPIRATORY PANEL BY RT PCR (FLU A&B, COVID)  MRSA PCR SCREENING  ETHANOL  APTT  DIFFERENTIAL  RAPID URINE DRUG SCREEN, HOSP PERFORMED  URINALYSIS, ROUTINE W REFLEX MICROSCOPIC  TRIGLYCERIDES  CBG MONITORING, ED    EKG None ED ECG REPORT   Date: 03/22/2020  Rate: 67  Rhythm: normal sinus rhythm  QRS Axis: normal  Intervals: normal  ST/T Wave abnormalities: normal  Conduction Disutrbances:right bundle branch block  Narrative Interpretation:   Old EKG Reviewed: none available LVH I have personally reviewed the EKG tracing and agree with the computerized printout as noted.  Radiology DG Chest Portable 1 View  Result Date: 04/07/2020 CLINICAL DATA:  Orogastric tube placement. Intubation. EXAM: PORTABLE CHEST 1 VIEW COMPARISON:  Radiograph 01/20/2020 FINDINGS:  Endotracheal tube tip 2.1 cm from the carina. Enteric tube tip and side-port below the diaphragm in the stomach. Upper normal heart size. Unchanged mediastinal contours. Atherosclerosis of the thoracic aorta. No focal airspace disease, pulmonary edema, large pleural effusion or pneumothorax. Chronic changes of both shoulders. IMPRESSION: 1. Endotracheal tube tip 2.1 cm from the carina. Enteric tube tip and side-port in the stomach. 2. No acute chest findings.  Aortic Atherosclerosis (ICD10-I70.0). Electronically Signed   By: Keith Rake M.D.   On: 03/30/2020 21:40   CT HEAD CODE STROKE WO CONTRAST  Result Date: 03/19/2020 CLINICAL DATA:  Code stroke.  Left-sided weakness EXAM: CT HEAD WITHOUT CONTRAST TECHNIQUE: Contiguous axial images were obtained from the base of the skull through the vertex without intravenous contrast. COMPARISON:  None. FINDINGS: Brain: Large area of parenchymal hemorrhage (approximately 4 x 5.4 x 5.4 cm) centered within the right thalamus and adjacent white matter extending into the midbrain and superior pons. There is associated surrounding edema. Intraventricular extension is present. There is mass effect on the right frontal horn, third ventricle, cerebral aqueduct, and superior fourth ventricle. There is leftward midline shift at the level of the third ventricle measuring 11 mm. Hydrocephalus is suspected. Gray-white differentiation is preserved. Additional patchy hypoattenuation in the supratentorial white matter is nonspecific but may reflect chronic microvascular ischemic changes. Vascular: There is intracranial atherosclerotic calcification at the skull base. Skull: Unremarkable. Sinuses/Orbits: Mild mucosal thickening. No significant orbital finding. Other: Mastoid air cells are clear. IMPRESSION: Large parenchymal hemorrhage centered within the right thalamus extending into the adjacent white matter and brainstem with surrounding edema. Intraventricular extension is present.  Hydrocephalus is likely. These results were communicated to Dr. Leonel Ramsay at 8:39 pmon 04/28/2021by text page via the [facility] messaging system. Electronically Signed   By: Macy Mis M.D.   On: 04/07/2020 20:58    Procedures Procedure Name: Intubation Date/Time: 03/30/2020 9:00 PM Performed by: Quintella Reichert, MD Pre-anesthesia Checklist: Patient identified, Patient being monitored, Emergency Drugs available, Timeout performed and Suction available Oxygen Delivery Method: Non-rebreather mask Preoxygenation: Pre-oxygenation with 100% oxygen Induction Type: Rapid sequence Ventilation: Mask ventilation without difficulty Laryngoscope Size: Glidescope Tube size: 7.5 mm Number of attempts: 1 Airway Equipment and Method: Video-laryngoscopy Placement Confirmation: ETT inserted through vocal cords under direct vision,  CO2 detector and Breath sounds checked- equal and bilateral Secured at: 23 cm Tube secured with: ETT holder      (including critical care time) CRITICAL CARE Performed by: Quintella Reichert   Total critical care time: 30 minutes  Critical care time was exclusive of separately billable procedures and treating other patients.  Critical care was necessary to treat or prevent imminent or life-threatening deterioration.  Critical care was time spent personally by me on the following activities: development of treatment plan with patient and/or surrogate as well as nursing, discussions with consultants, evaluation of patient's response to treatment, examination of patient, obtaining history from patient or surrogate, ordering and performing treatments and interventions, ordering and review of laboratory studies, ordering and review of radiographic studies, pulse oximetry and re-evaluation of patient's condition.  Medications Ordered in  ED Medications   stroke: mapping our early stages of recovery book ( Does not apply Not Given 03/28/2020 2308)  acetaminophen (TYLENOL) tablet 650 mg  (has no administration in time range)    Or  acetaminophen (TYLENOL) 160 MG/5ML solution 650 mg (has no administration in time range)    Or  acetaminophen (TYLENOL) suppository 650 mg (has no administration in time range)  senna-docusate (Senokot-S) tablet 1 tablet (1 tablet Oral Not Given 03/29/2020 2310)  pantoprazole (PROTONIX) injection 40 mg (40 mg Intravenous Not Given 04/04/2020 2310)  clevidipine (CLEVIPREX) infusion 0.5 mg/mL (0 mg/hr Intravenous Stopped 03/10/2020 2130)  Chlorhexidine Gluconate Cloth 2 % PADS 6 each (6 each Topical Given 03/29/2020 2308)  chlorhexidine gluconate (MEDLINE KIT) (PERIDEX) 0.12 % solution 15 mL (15 mLs Mouth Rinse Given 03/16/2020 2202)  MEDLINE mouth rinse (15 mLs Mouth Rinse Given 03/15/2020 2309)  propofol (DIPRIVAN) 1000 MG/100ML infusion (has no administration in time range)  midazolam (VERSED) injection 1 mg (has no administration in time range)  midazolam (VERSED) injection 1 mg (has no administration in time range)  dextrose 5 % solution ( Intravenous Other (enter comment in med admin window) 03/27/2020 2309)  acetaminophen (TYLENOL) tablet 650 mg (has no administration in time range)    Or  acetaminophen (TYLENOL) suppository 650 mg (has no administration in time range)  diphenhydrAMINE (BENADRYL) injection 25 mg (has no administration in time range)  glycopyrrolate (ROBINUL) tablet 1 mg (has no administration in time range)    Or  glycopyrrolate (ROBINUL) injection 0.2 mg (has no administration in time range)    Or  glycopyrrolate (ROBINUL) injection 0.2 mg (has no administration in time range)  polyvinyl alcohol (LIQUIFILM TEARS) 1.4 % ophthalmic solution 1 drop (has no administration in time range)  labetalol (NORMODYNE) injection (20 mg Intravenous Given 04/05/2020 2035)  etomidate (AMIDATE) injection (15 mg Intravenous Given 03/10/2020 2021)  rocuronium (ZEMURON) injection (80 mg Intravenous Given 03/11/2020 2021)    ED Course  I have reviewed the triage vital signs and  the nursing notes.  Pertinent labs & imaging results that were available during my care of the patient were reviewed by me and considered in my medical decision making (see chart for details).    MDM Rules/Calculators/A&P                     Patient presents the emergency department as a code stroke for left sided weakness and altered mental status. Patient developed vomiting and required intubation for airway protection. CT head demonstrates large intracranial hemorrhage.  She was evaluated by Neurology on ED arrival.  Neurology discussed the patient's status and imaging findings with her family as well as prognosis. She was started on cleviprex for blood pressure control. Plan to admit to the neuro- ICU for further treatment.  Final Clinical Impression(s) / ED Diagnoses Final diagnoses:  None    Rx / DC Orders ED Discharge Orders    None       Quintella Reichert, MD 03-28-2020 Lupita Shutter    Quintella Reichert, MD 03/22/20 2116

## 2020-03-13 DIAGNOSIS — I4821 Permanent atrial fibrillation: Secondary | ICD-10-CM

## 2020-03-13 DIAGNOSIS — G911 Obstructive hydrocephalus: Secondary | ICD-10-CM

## 2020-03-13 DIAGNOSIS — I161 Hypertensive emergency: Secondary | ICD-10-CM

## 2020-03-13 DIAGNOSIS — I615 Nontraumatic intracerebral hemorrhage, intraventricular: Principal | ICD-10-CM

## 2020-03-13 DIAGNOSIS — I610 Nontraumatic intracerebral hemorrhage in hemisphere, subcortical: Secondary | ICD-10-CM

## 2020-03-13 DIAGNOSIS — Z7901 Long term (current) use of anticoagulants: Secondary | ICD-10-CM

## 2020-03-13 LAB — MRSA PCR SCREENING: MRSA by PCR: NEGATIVE

## 2020-03-13 MED ORDER — ONDANSETRON 4 MG PO TBDP: 4.0000 mg | ORAL_TABLET | Freq: Four times a day (QID) | ORAL | Status: DC | PRN

## 2020-03-13 MED ORDER — HALOPERIDOL LACTATE 5 MG/ML IJ SOLN
0.5000 mg | INTRAMUSCULAR | Status: DC | PRN
  Administered 2020-03-13: 0.5 mg via INTRAVENOUS
  Filled 2020-03-13: qty 1

## 2020-03-13 MED ORDER — SCOPOLAMINE 1 MG/3DAYS TD PT72
1.0000 | MEDICATED_PATCH | TRANSDERMAL | Status: DC
  Administered 2020-03-13: 1.5 mg via TRANSDERMAL
  Filled 2020-03-13: qty 1

## 2020-03-13 MED ORDER — MORPHINE SULFATE (PF) 2 MG/ML IV SOLN
1.0000 mg | INTRAVENOUS | Status: DC | PRN
  Administered 2020-03-13: 1 mg via INTRAVENOUS
  Filled 2020-03-13: qty 1

## 2020-03-13 MED ORDER — MORPHINE 100MG IN NS 100ML (1MG/ML) PREMIX INFUSION
1.0000 mg/h | INTRAVENOUS | Status: DC
  Administered 2020-03-13: 1 mg/h via INTRAVENOUS
  Filled 2020-03-13: qty 100

## 2020-03-13 MED ORDER — HALOPERIDOL LACTATE 2 MG/ML PO CONC
0.5000 mg | ORAL | Status: DC | PRN
  Filled 2020-03-13: qty 0.3

## 2020-03-13 MED ORDER — ONDANSETRON HCL 4 MG/2ML IJ SOLN: 4.0000 mg | Freq: Four times a day (QID) | INTRAMUSCULAR | Status: DC | PRN

## 2020-03-13 MED ORDER — HALOPERIDOL 0.5 MG PO TABS
0.5000 mg | ORAL_TABLET | ORAL | Status: DC | PRN
  Filled 2020-03-13: qty 1

## 2020-03-13 MED ORDER — BIOTENE DRY MOUTH MT LIQD: 15.0000 mL | OROMUCOSAL | Status: DC | PRN

## 2020-04-09 NOTE — Progress Notes (Signed)
OT Cancellation Note  Patient Details Name: RAGHAD LORENZ MRN: [id number] DOB: 06-25-1929   Cancelled Treatment:    Reason Eval/Treat Not Completed: Medical issues which prohibited therapy.  Pt has transitioned to full comfort.  OT has signed off.  Eber Jones., OTR/L Acute Rehabilitation Services Pager [phone] Office [phone]   Jeani Hawking M 03/20/2020, 5:43 AM

## 2020-04-09 NOTE — Consult Note (Signed)
CC consult placed earlier this morning. Noted that patient has now been transitioned to comfort measures. Will follow peripherally but please don't hesitate to contact PCCM if we can be of further assistance.  Durel Salts, MD Pulmonary and Critical Care Medicine [facility] Pager: [phone] Office:[phone]

## 2020-04-09 NOTE — Progress Notes (Signed)
PT transferred to floor with morphine drip and subsequently passed away. Morphine measured 75 mL remaining and wasted with 2 nurse verification, Brynnleigh Mcelwee L. And charge nurse Danford Bad, RN.

## 2020-04-09 NOTE — Death Summary Note (Signed)
Death Summary    Patient ID: Katherine Tyler   MRN: [id number]        DOB: 09/16/1929  Date of Admission: 04/09/20 Date of Death: Apr 10, 2020  Attending Physician:  Rosalin Hawking, MD, Stroke MD Consultant(s):   Treatment Team:  Stroke, Md, MD [place] Spero Geralds, MD Patient's PCP:  Sharion Balloon, FNP  DIAGNOSIS:   ICH - Large right thalamic ICH and IVH with likely hydrocephalus - etiology likely due to HTN in the setting of Eliquis use. CT Head - Large parenchymal hemorrhage centered within the right thalamus extending into the adjacent white matter and brainstem with surrounding edema. Intraventricular extension is present. Hydrocephalus is likely.   Active Problems:  ICH (intracerebral hemorrhage) (HCC)  Mild Thrombocytopenia - 145  Hyponatremia - 132  CKD stage IIIa creatinine - 1.20->1.09  Iron Deficiency Anemia  Hypertension  Paroxysmal atrial fibrillation  Hyperlipidemia  Anticoagulation  Grade I diastolic dysfunction.   Moderate pulmonary hypertension  Intubated to protect airway   Past Medical History:  Diagnosis Date  . Anxiety   . Arthritis   . Chronic diastolic CHF (congestive heart failure) ([place])   . GERD (gastroesophageal reflux disease)   . Hiatal hernia   . Hyperkalemia   . Hyperlipidemia   . Hypertension   . Hypothyroidism   . Iron deficiency anemia   . Obesity   . PAF (paroxysmal atrial fibrillation) ([place])    a. diagnosed in 01/2018. Started on Eliquis for anticoagulation.   . Vitamin D deficiency    Past Surgical History:  Procedure Laterality Date  . CHOLECYSTECTOMY    . I & D EXTREMITY Right 07/12/2019   Procedure: Irrigation And Debridement Extremity;  Surgeon: Wylene Simmer, MD;  Location: [facility];  Service: Orthopedics;  Laterality: Right;  . ORIF ANKLE FRACTURE Right 07/12/2019   Procedure: OPEN  AND REDUCTION INTERNAL FIXATION (ORIF) ANKLE FRACTURE;  Surgeon: Wylene Simmer, MD;  Location: [place] [facility];  Service: Orthopedics;   Laterality: Right;  . TUBAL LIGATION  1960    Family History Family History  Problem Relation Age of Onset  . Heart disease Mother   . Stroke Father   . Heart disease Sister   . Heart disease Brother   . Heart disease Sister   . Heart disease Sister   . Heart disease Brother   . Heart disease Brother   . Diabetes Daughter     Social History  reports that she has never smoked. She quit smokeless tobacco use about 63 years ago.  Her smokeless tobacco use included snuff. She reports that she does not drink alcohol or use drugs.    HOME MEDICATIONS PRIOR TO ADMISSION Medications Prior to Admission  Medication Sig Dispense Refill  . acetaminophen (TYLENOL) 325 MG tablet Take 1-2 tablets (325-650 mg total) by mouth every 4 (four) hours as needed for mild pain.    . Cholecalciferol (D3-1000) 1000 UNITS capsule Take 1,000 Units by mouth 2 (two) times daily.     . citalopram (CELEXA) 20 MG tablet Take 1 tablet (20 mg total) by mouth daily. 90 tablet 1  . ELIQUIS 5 MG TABS tablet Take 1 tablet by mouth twice daily 180 tablet 1  . Ferrous Sulfate Dried (SLOW RELEASE IRON) 45 MG TBCR Take 45 mg by mouth 2 (two) times daily. 60 tablet 2  . furosemide (LASIX) 20 MG tablet Take 1 tablet (20 mg total) by mouth every Monday, Wednesday, and Friday for 15  days. 45 tablet 3  . levothyroxine (SYNTHROID) 175 MCG tablet Take 1 tablet (175 mcg total) by mouth daily at 6 (six) AM. 30 tablet 2  . losartan (COZAAR) 25 MG tablet Take 1 tablet (25 mg total) by mouth daily. 30 tablet 2  . magnesium oxide (MAG-OX) 400 (241.3 MG) MG tablet Take 1 tablet (400 mg total) by mouth daily. (Patient taking differently: Take 400 mg by mouth every morning. ) 30 tablet 0  . metoprolol succinate (TOPROL XL) 25 MG 24 hr tablet Take 1 tablet (25 mg total) by mouth daily. 90 tablet 3  . nystatin (MYCOSTATIN/NYSTOP) powder Apply topically 4 (four) times daily. (Patient taking differently: Apply 1 application topically 4 (four)  times daily as needed (for rash/skin irritation). ) 15 g 0  . polyethylene glycol (MIRALAX / GLYCOLAX) 17 g packet Take 17 g by mouth daily. 30 each 0  . potassium chloride SA (K-DUR) 20 MEQ tablet Take 1 tablet by mouth once daily (Patient taking differently: Take 20 mEq by mouth every morning. ) 30 tablet 6  . senna-docusate (SENOKOT-S) 8.6-50 MG tablet Take 2 tablets by mouth 2 (two) times daily. Adjust it up and down to make sure that you have a good BM daily. (Patient taking differently: Take 2 tablets by mouth daily. ) 120 tablet 0  . thiamine (VITAMIN B-1) 100 MG tablet Take 100 mg by mouth every morning.        HOSPITAL MEDICATIONS . chlorhexidine gluconate (MEDLINE KIT)  15 mL Mouth Rinse BID  . Chlorhexidine Gluconate Cloth  6 each Topical Daily  . mouth rinse  15 mL Mouth Rinse 10 times per day  . scopolamine  1 patch Transdermal Q72H    LABORATORY STUDIES CBC    Component Value Date/Time   WBC 4.7 03/22/2020 2058   RBC 3.42 (L) 03/30/2020 2058   HGB 10.2 (L) 03/15/2020 2102   HGB 10.5 (L) 03/10/2020 1611   HCT 30.0 (L) 03/23/2020 2102   HCT 31.4 (L) 03/10/2020 1611   PLT 145 (L) 03/10/2020 2058   PLT 164 03/10/2020 1611   MCV 105.0 (H) 03/21/2020 2058   MCV 98 (H) 03/10/2020 1611   MCH 32.2 03/18/2020 2058   MCHC 30.6 03/16/2020 2058   RDW 13.1 03/31/2020 2058   RDW 12.1 03/10/2020 1611   LYMPHSABS 0.9 03/29/2020 2058   LYMPHSABS 1.0 03/10/2020 1611   MONOABS 0.4 03/27/2020 2058   EOSABS 0.1 03/19/2020 2058   EOSABS 0.1 03/10/2020 1611   BASOSABS 0.0 03/27/2020 2058   BASOSABS 0.0 03/10/2020 1611   CMP    Component Value Date/Time   NA 132 (L) 03/15/2020 2102   NA 136 03/10/2020 1611   K 3.7 03/28/2020 2102   CL 96 (L) 03/16/2020 2058   CO2 25 03/27/2020 2058   GLUCOSE 113 (H) 03/16/2020 2058   BUN 22 03/19/2020 2058   BUN 23 03/10/2020 1611   CREATININE 1.09 (H) 03/15/2020 2058   CREATININE 1.11 (H) 04/16/2013 1155   CALCIUM 9.3 03/17/2020 2058    PROT 6.0 (L) 03/30/2020 2058   PROT 5.9 (L) 03/10/2020 1611   ALBUMIN 3.3 (L) 03/10/2020 2058   ALBUMIN 3.6 03/10/2020 1611   AST 20 03/11/2020 2058   ALT 10 03/22/2020 2058   ALKPHOS 45 03/11/2020 2058   BILITOT 0.4 03/11/2020 2058   BILITOT 0.2 03/10/2020 1611   GFRNONAA 45 (L) 03/21/2020 2058   GFRNONAA 47 (L) 04/16/2013 1155   GFRAA 52 (L) 03/21/2020 2058  GFRAA 54 (L) 04/16/2013 1155   COAGS Lab Results  Component Value Date   INR 1.4 (H) 03/27/2020   INR 1.10 06/18/2016   INR 2.5 (H) 07/11/2009   Lipid Panel    Component Value Date/Time   CHOL 154 04/05/2014 1051   CHOL 168 04/16/2013 1155   TRIG 93 04/05/2014 1051   TRIG 85 04/16/2013 1155   HDL 59 04/05/2014 1051   HDL 58 04/16/2013 1155   LDLCALC 76 04/05/2014 1051   LDLCALC 93 04/16/2013 1155   HgbA1C  Lab Results  Component Value Date   HGBA1C 5.3 09/12/2015   Urinalysis    Component Value Date/Time   COLORURINE YELLOW 01/20/2020 1355   APPEARANCEUR HAZY (A) 01/20/2020 1355   LABSPEC 1.016 01/20/2020 1355   PHURINE 5.0 01/20/2020 [place] 01/20/2020 1355   HGBUR NEGATIVE 01/20/2020 [place] 01/20/2020 1355   KETONESUR NEGATIVE 01/20/2020 1355   PROTEINUR 30 (A) 01/20/2020 1355   UROBILINOGEN 1.0 03/08/2008 1015   NITRITE NEGATIVE 01/20/2020 1355   LEUKOCYTESUR LARGE (A) 01/20/2020 1355   Urine Drug Screen No results found for: LABOPIA, COCAINSCRNUR, LABBENZ, AMPHETMU, THCU, LABBARB  Alcohol Level    Component Value Date/Time   [facility] <10 03/22/2020 2058     SIGNIFICANT DIAGNOSTIC STUDIES  DG Chest Portable 1 View 03/27/2020 IMPRESSION:  1. Endotracheal tube tip 2.1 cm from the carina. Enteric tube tip and side-port in the stomach.  2. No acute chest findings.   Aortic Atherosclerosis (ICD10-I70.0).   CT HEAD CODE STROKE WO CONTRAST 03/29/2020 IMPRESSION:  Large parenchymal hemorrhage centered within the right thalamus extending into the adjacent white  matter and brainstem with surrounding edema. Intraventricular extension is present. Hydrocephalus is likely.    Transthoracic Echocardiogram  01/21/2020 IMPRESSIONS  1. Left ventricular ejection fraction, by estimation, is 70 to 75%. The  left ventricle has normal function. The left ventrical has no regional  wall motion abnormalities. There is mildly increased left ventricular  hypertrophy. Left ventricular diastolic  parameters are consistent with Grade I diastolic dysfunction (impaired  relaxation).  2. Right ventricular systolic function is normal. The right ventricular  size is normal. There is moderately elevated pulmonary artery systolic  pressure.  3. Left atrial size was severely dilated.  4. Right atrial size was mildly dilated.  5. The mitral valve is normal in structure and function. Mild mitral  valve regurgitation. No evidence of mitral stenosis.  6. The aortic valve is tricuspid. Aortic valve regurgitation is not  visualized. No aortic stenosis is present.  7. There is moderate pulmonary hypertension, PASP is 53 mmHg.  8. The inferior vena cava is normal in size with greater than 50%  respiratory variability, suggesting right atrial pressure of 3 mmHg.    ECG - SR rate 67 BPM. (See cardiology reading for complete details)     HISTORY OF PRESENT ILLNESS (From Dr Cecil Cobbs H&P on 03/31/2020) Katherine Tyler is a 86 y.o. female with a history of atrial fibrillation on Eliquis who was in her normal state of health until this evening.  At that point, her daughter noticed that she was dragging her leg.  She also began complaining of a headache.  Due to this new finding, EMS was called and on their arrival, she was complaining of a headache, but still interactive. En route, she began having decreased consciousness and on arrival to the ED, she was still quasi-purposeful on the right, weak on the left, but with  a fairly poor mental status.  There were no signs of airway  compromise initially and therefore she was taken to CT, but unfortunate before CT could be completed she began having emesis and had to be emergently intubated in the CT scanner.   HOSPITAL COURSE Katherine Tyler is a 86 y.o. female with history of atrial fibrillation on Eliquis, CHF, HTN, HLD, hyperkalemia and hypothyroidism presenting with HA, L sided weakness and AMS. She did not receive IV t-PA due to [place].  ICH - Large right thalamic ICH and IVH with likely hydrocephalus - etiology likely due to HTN in the setting of Eliquis use.  CT Head - Large parenchymal hemorrhage centered within the right thalamus extending into the adjacent white matter and brainstem with surrounding edema. Intraventricular extension is present. Hydrocephalus is likely.   2D Echo - 01/21/20 - EF 70 - 75%. Grade I diastolic dysfunction. Moderate pulmonary hypertension.  [employer] Virus 2  - negative  VTE prophylaxis - none given comfort care measures  Eliquis (apixaban) daily prior to admission, now on No antithrombotic  Disposition:  Comfort care measures now requested by family given poor prognosis  Afib   On eliquis PTA  Now not on any antithrombotics due to [place] and comfort care measures.   Rate controlled  Hypertension  Home BP meds: Cozaar ; metoprolol ; lasix  Off Cleviprex for comfort care measures  Hyperlipidemia  Home Lipid lowering medication: none   Current lipid lowering medication: none (statin contraindicated with ICH)  Other Stroke Risk Factors  Advanced age  Obesity, Body mass index is 35.53 kg/m., recommend weight loss, diet and exercise as appropriate   Family hx stroke (stroke)  Congestive Heart Failure  Other Active Problems  Code status - DNR - Comfort Care Measures  Mild Thrombocytopenia - 145  Hyponatremia - 132  CKD stage IIIa creatinine - 1.20->1.09    SUMMARY  03/11/2020 - Roland Rack, MD Impression: 86 year old female with large  intraparenchymal hemorrhage centered in the subcortical structures, but with extension into the brainstem.  She already has clinical evidence of brainstem compression and some CT evidence of obstructive hydrocephalus.  I discussed with the daughter that I did not feel this this was a survivable hemorrhage, certainly nonsurvivable with any quality of life.  She expressed understanding and indicated that in this scenario the patient would not want any heroic measures such as chest compressions, surgeries, or expensive medications that would be unexpected to give her significant quality of life (e.g. reversal of anticoagulation). She has multiple brothers and sisters who wish to be involved in any decision about moving her to a comfort care status, and therefore she is going to call them and discuss, but in the interim we will continue supportive care such as ventilation and blood pressure control, but not code or proceed with any type of aggressive medical interventions or surgery. Exam has worsened, now pupils are fixed, R is 4-5 mmf, left 91m, but neither is reactive, corneals are absent, no response to oculocephalic maneuver, cough is absent. Minimal flicker to nox stim in the right leg.  Daughter, MShawnie Ponscalled and stated that she had discussed with her brothers and sisters and they indicated that she did not want to be kept alive on machines. She expresses the wish to change her to comfort care. I asked about family visitation prior to withdrawal, but the daughter expresses that this is not needed.  In accordance with her wishes I have placed orders to  extubate to room air and proceed with comfort only care.  Roland Rack, MD [facility]   04/07/2020 10:50 PM The patient was extubated by respiratory therapy.   03-30-2020 Lenice Llamas, MD - Critical Care Medicine CC consult placed earlier this morning. Noted that patient has now been transitioned to comfort measures. Will follow  peripherally but please don't hesitate to contact PCCM if we can be of further assistance. Lenice Llamas, MD   Mar 30, 2020 Time of death 38. Family informed. Death certificate signed, gave to unit nurse.  Rosalin Hawking, MD PhD Stroke Neurology Mar 30, 2020 6:25 PM  40 minutes were spent preparing discharge.  Mikey Bussing PA-C [facility] Pager [phone] 03/14/2020, 1:13 PM

## 2020-04-09 NOTE — Plan of Care (Signed)
Time of death 63. Family informed. Death certificate signed, gave to unit nurse.   Marvel Plan, MD PhD Stroke Neurology 04/01/2020 6:25 PM

## 2020-04-09 NOTE — Progress Notes (Signed)
STROKE TEAM PROGRESS NOTE   INTERVAL HISTORY Her RN is at the bedside.  Pt lying in bed, not in respiratory distress. Unresponsive, not open eyes. Noted that pt is under comfort care measures. Comfort care ordered edited.   OBJECTIVE Vitals:   03-15-20 0200 03/15/2020 0300 03/15/20 0400 03-15-20 0500  BP: (!) 161/44 (!) 166/55 (!) 173/50 (!) 160/63  Pulse: (!) 46 (!) 46 (!) 47 91  Resp: 16 14 15 16   Temp:      TempSrc:      SpO2: 93% 90% 94% (!) 89%  Weight:      Height:        CBC:  Recent Labs  Lab 03/10/20 1611 03/16/2020 2012 03/30/2020 2058 03/30/2020 2102  WBC 4.3  --  4.7  --   NEUTROABS 2.8  --  3.3  --   HGB 10.5*   < > 11.0* 10.2*  HCT 31.4*   < > 35.9* 30.0*  MCV 98*  --  105.0*  --   PLT 164  --  145*  --    < > = values in this interval not displayed.    Basic Metabolic Panel:  Recent Labs  Lab 03/10/20 1611 03/10/20 1611 04/02/2020 2012 03/21/2020 2012 03/19/2020 2058 04/02/2020 2102  NA 136  --  133*   < > 133* 132*  K 5.4*   < > 4.2   < > 3.9 3.7  CL 101   < > 95*  --  96*  --   CO2 24  --   --   --  25  --   GLUCOSE 87  --  100*  --  113*  --   BUN 23  --  26*  --  22  --   CREATININE 1.02*   < > 1.20*  --  1.09*  --   CALCIUM 9.4  --   --   --  9.3  --    < > = values in this interval not displayed.    Lipid Panel:     Component Value Date/Time   CHOL 154 04/05/2014 1051   CHOL 168 04/16/2013 1155   TRIG 93 04/05/2014 1051   TRIG 85 04/16/2013 1155   HDL 59 04/05/2014 1051   HDL 58 04/16/2013 1155   LDLCALC 76 04/05/2014 1051   LDLCALC 93 04/16/2013 1155   HgbA1c:  Lab Results  Component Value Date   HGBA1C 5.3 09/12/2015   Urine Drug Screen: No results found for: LABOPIA, COCAINSCRNUR, LABBENZ, AMPHETMU, THCU, LABBARB  Alcohol Level     Component Value Date/Time   ETH <10 04/06/2020 2058    IMAGING  DG Chest Portable 1 View 03/28/2020 IMPRESSION:  1. Endotracheal tube tip 2.1 cm from the carina. Enteric tube tip and side-port in the  stomach.  2. No acute chest findings.   Aortic Atherosclerosis (ICD10-I70.0).   CT HEAD CODE STROKE WO CONTRAST 03/22/2020 IMPRESSION:  Large parenchymal hemorrhage centered within the right thalamus extending into the adjacent white matter and brainstem with surrounding edema. Intraventricular extension is present. Hydrocephalus is likely.    Transthoracic Echocardiogram  01/21/2020 IMPRESSIONS  1. Left ventricular ejection fraction, by estimation, is 70 to 75%. The  left ventricle has normal function. The left ventrical has no regional  wall motion abnormalities. There is mildly increased left ventricular  hypertrophy. Left ventricular diastolic  parameters are consistent with Grade I diastolic dysfunction (impaired  relaxation).  2. Right ventricular systolic function is normal. The  right ventricular  size is normal. There is moderately elevated pulmonary artery systolic  pressure.  3. Left atrial size was severely dilated.  4. Right atrial size was mildly dilated.  5. The mitral valve is normal in structure and function. Mild mitral  valve regurgitation. No evidence of mitral stenosis.  6. The aortic valve is tricuspid. Aortic valve regurgitation is not  visualized. No aortic stenosis is present.  7. There is moderate pulmonary hypertension, PASP is 53 mmHg.  8. The inferior vena cava is normal in size with greater than 50%  respiratory variability, suggesting right atrial pressure of 3 mmHg.    ECG - SR rate 67 BPM. (See cardiology reading for complete details)   PHYSICAL EXAM Blood pressure (!) 160/63, pulse 91, temperature 97.7 F (36.5 C), temperature source Oral, resp. rate 16, height 5\' 4"  (1.626 m), weight 93.9 kg, SpO2 (!) 89 %.  Limited exam due to comfort care measures. Pt lying in bed, eyes closed, not open on voice. Not following commands. Eyes mid position. Left pupil 71mm and right 3.53mm, not reactive to light. No significant facial asymmetry, not moving  all extremities. Breathing not labored.    ASSESSMENT/PLAN Katherine Tyler is a 86 y.o. female with history of atrial fibrillation on Eliquis, CHF, HTN, HLD, hyperkalemia and hypothyroidism presenting with HA, L sided weakness and AMS. She did not receive IV t-PA due to [place].  ICH - Large right thalamic ICH and IVH with likely hydrocephalus - etiology likely due to HTN in the setting of eliquis use  CT Head - Large parenchymal hemorrhage centered within the right thalamus extending into the adjacent white matter and brainstem with surrounding edema. Intraventricular extension is present. Hydrocephalus is likely.   2D Echo - 01/21/20 - EF 70 - 75%. Grade I diastolic dysfunction. Moderate pulmonary hypertension.  [employer] Virus 2  - negative  VTE prophylaxis - none given comfort care measures  Eliquis (apixaban) daily prior to admission, now on No antithrombotic  Disposition:  Comfort care measures now requested by family given poor prognosis  Afib   On eliquis PTA  Now not on any antithrombotics due to [place] and comfort care measures.   Rate controlled  Hypertension  Home BP meds: Cozaar ; metoprolol ; lasix  Off Cleviprex for comfort care measures  Hyperlipidemia  Home Lipid lowering medication: none   Current lipid lowering medication: none (statin contraindicated with ICH)  Other Stroke Risk Factors  Advanced age  Obesity, Body mass index is 35.53 kg/m., recommend weight loss, diet and exercise as appropriate   Family hx stroke (stroke)  Congestive Heart Failure  Other Active Problems  Code status - DNR  Mild Thrombocytopenia - 145  Hyponatremia - 132  CKD stage IIIa creatinine - 1.20->1.09  Hospital day # 1  Rosalin Hawking, MD PhD Stroke Neurology 04-11-2020 9:52 AM   To contact Stroke Continuity provider, please refer to [email or web address] After hours, contact General Neurology

## 2020-04-09 DEATH — deceased

## 2020-06-08 IMAGING — DX ABDOMEN - 1 VIEW
1 series · 1 of 1 positions shown · non-contrast
Comparison: 07/20/2019 at [DATE] a.m.

CLINICAL DATA: NG tube placement.

EXAM:
ABDOMEN - 1 VIEW

[abdomen kub]
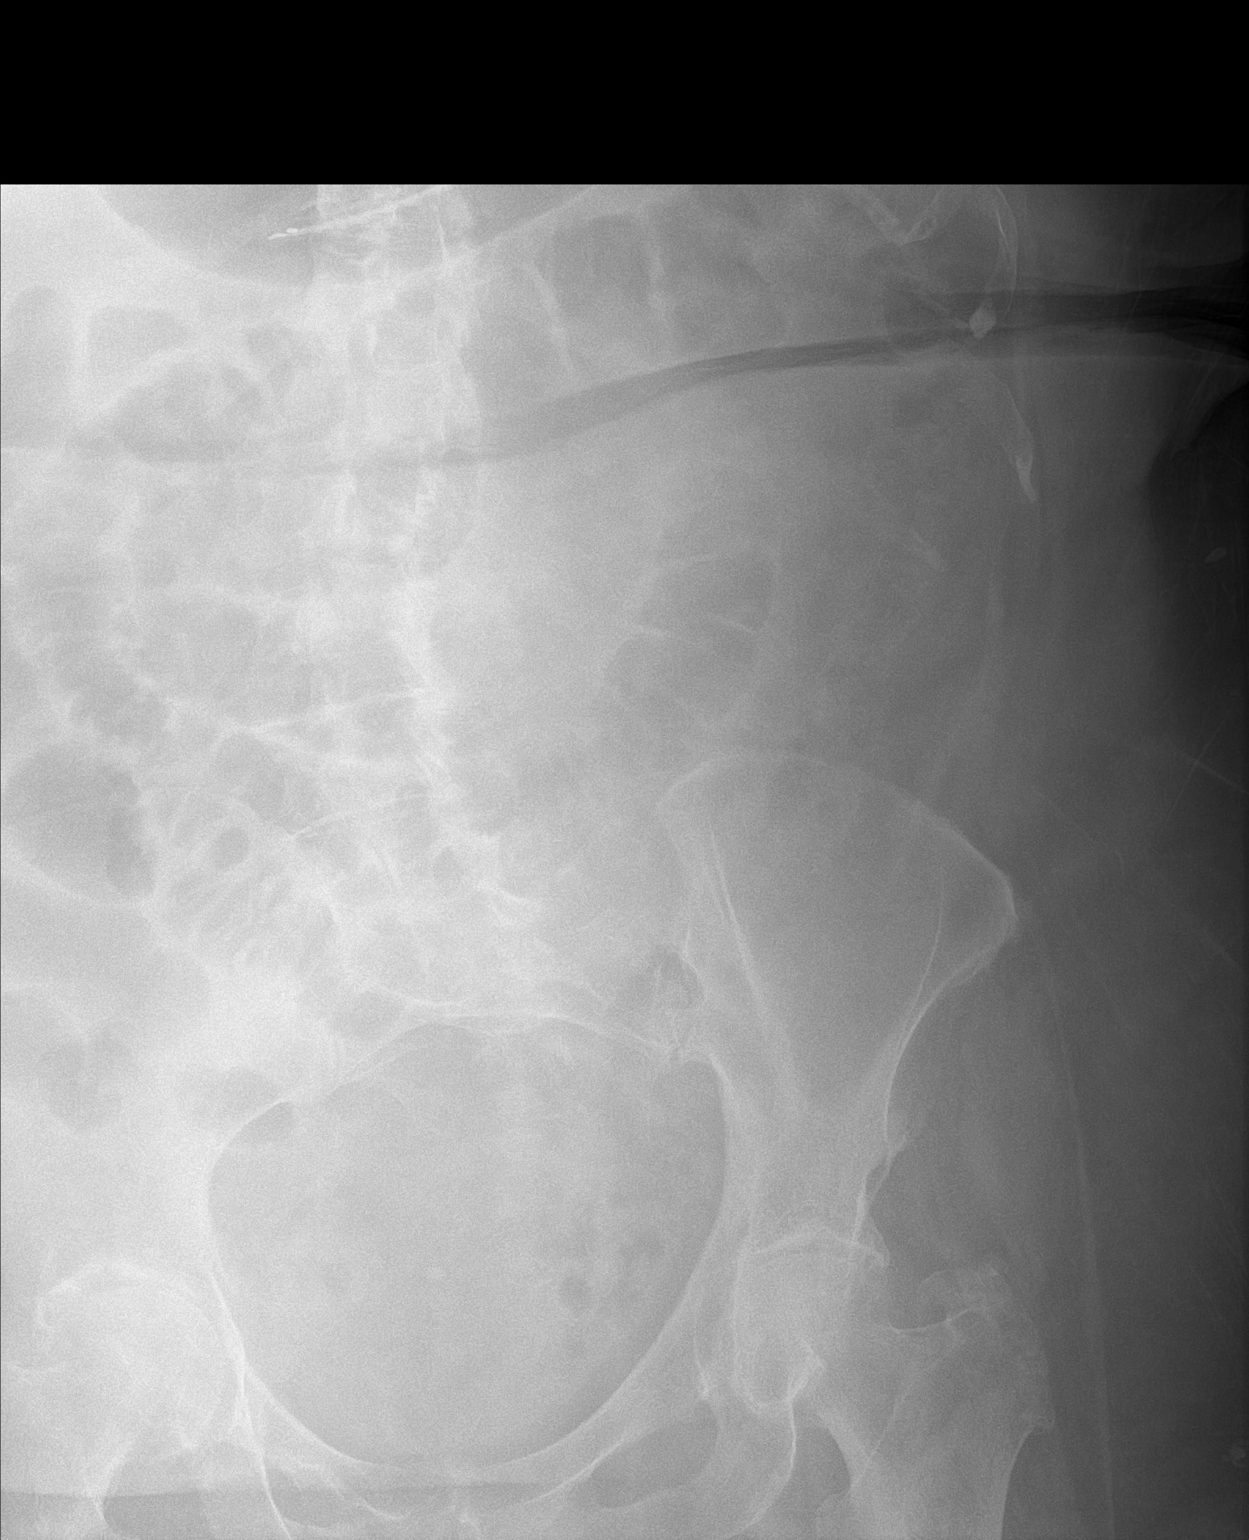

[1 of 1 positions shown; findings below may reference images not displayed]

FINDINGS: Nasogastric tube extends below the diaphragm, tip in the mid to
distal stomach.

Mildly prominent loops of small bowel are unchanged from the earlier
exam.
IMPRESSION: Well-positioned nasogastric tube.

## 2020-06-08 IMAGING — DX ABDOMEN - 1 VIEW
1 series · 1 of 1 positions shown · non-contrast
Comparison: None.

CLINICAL DATA: Nausea, vomiting.

EXAM:
ABDOMEN - 1 VIEW

[abdomen kub]
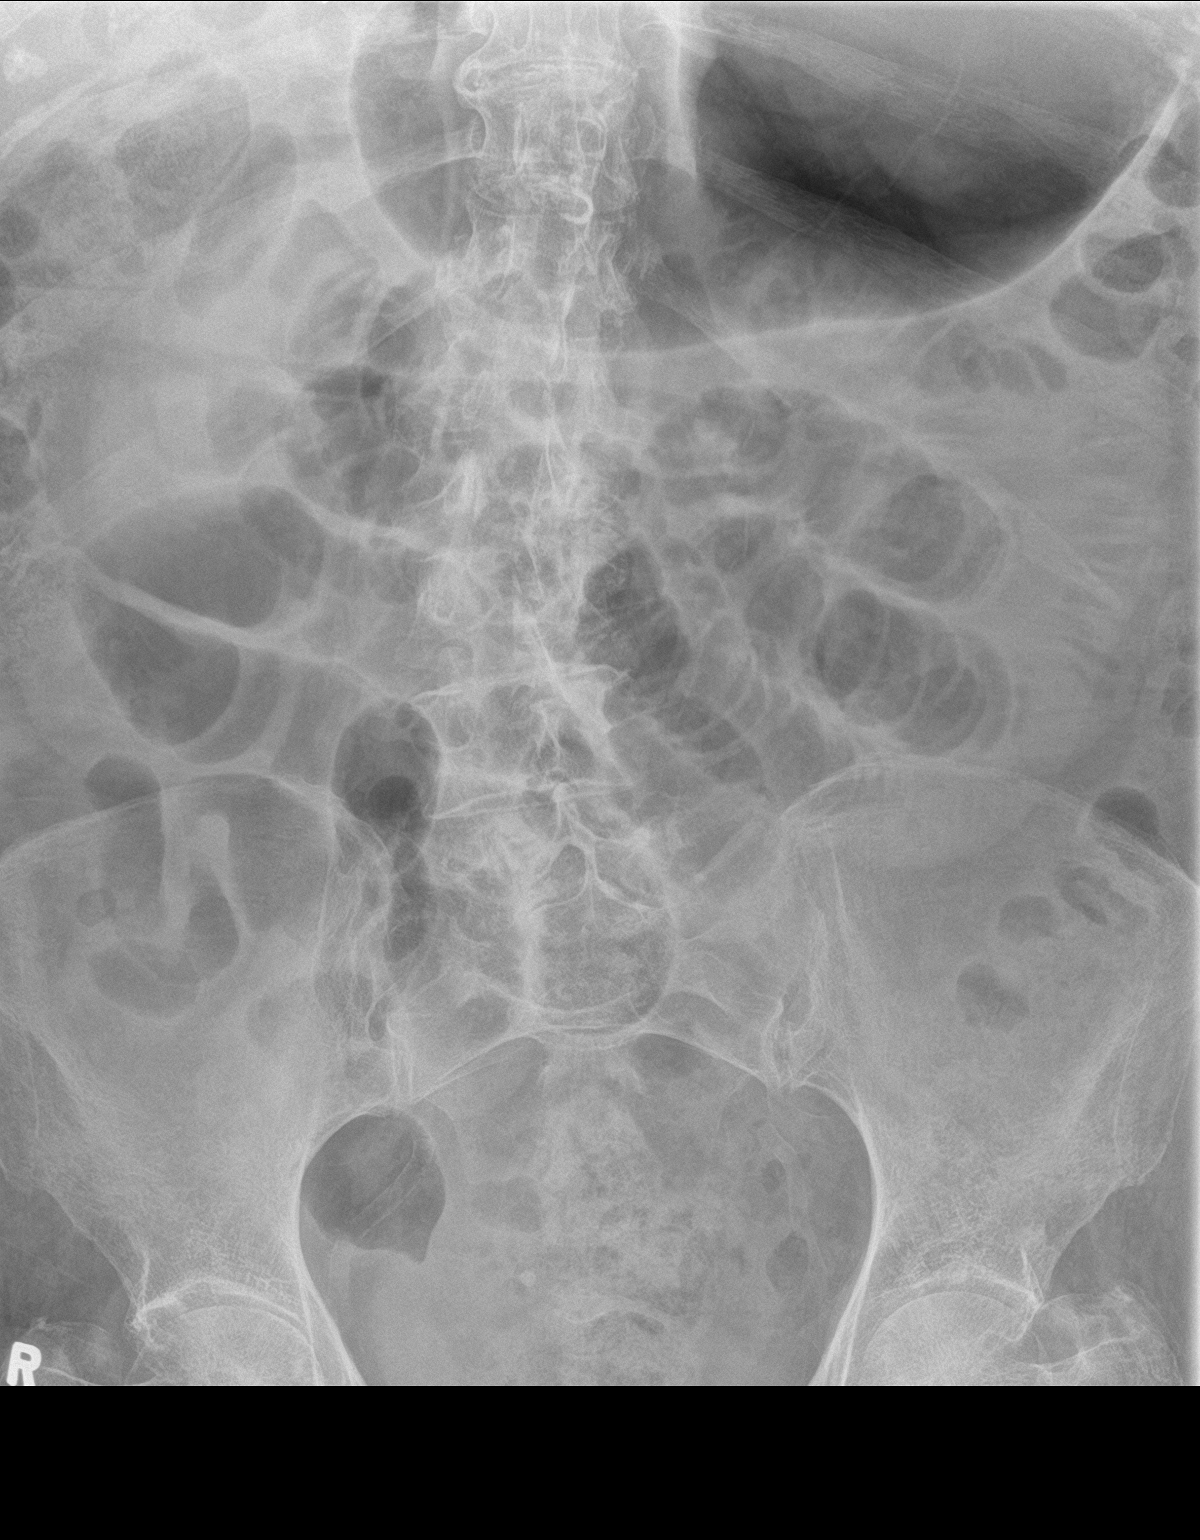

[1 of 1 positions shown; findings below may reference images not displayed]

FINDINGS: Mildly dilated small bowel loops are noted centrally which may
represent focal ileus or possibly distal small bowel obstruction. No
colonic dilatation is noted. Stool is noted in the rectum. No
radio-opaque calculi or other significant radiographic abnormality
are seen.
IMPRESSION: Mildly dilated small bowel loops are noted centrally which may
represent focal ileus or possibly distal small bowel obstruction.

## 2020-06-09 IMAGING — CR ABDOMEN - 1 VIEW
2 series · 2 of 2 positions shown · non-contrast
Comparison: 07/20/2019

CLINICAL DATA: Check NG tube

EXAM:
ABDOMEN - 1 VIEW

[abdomen kub (1 of 2)]
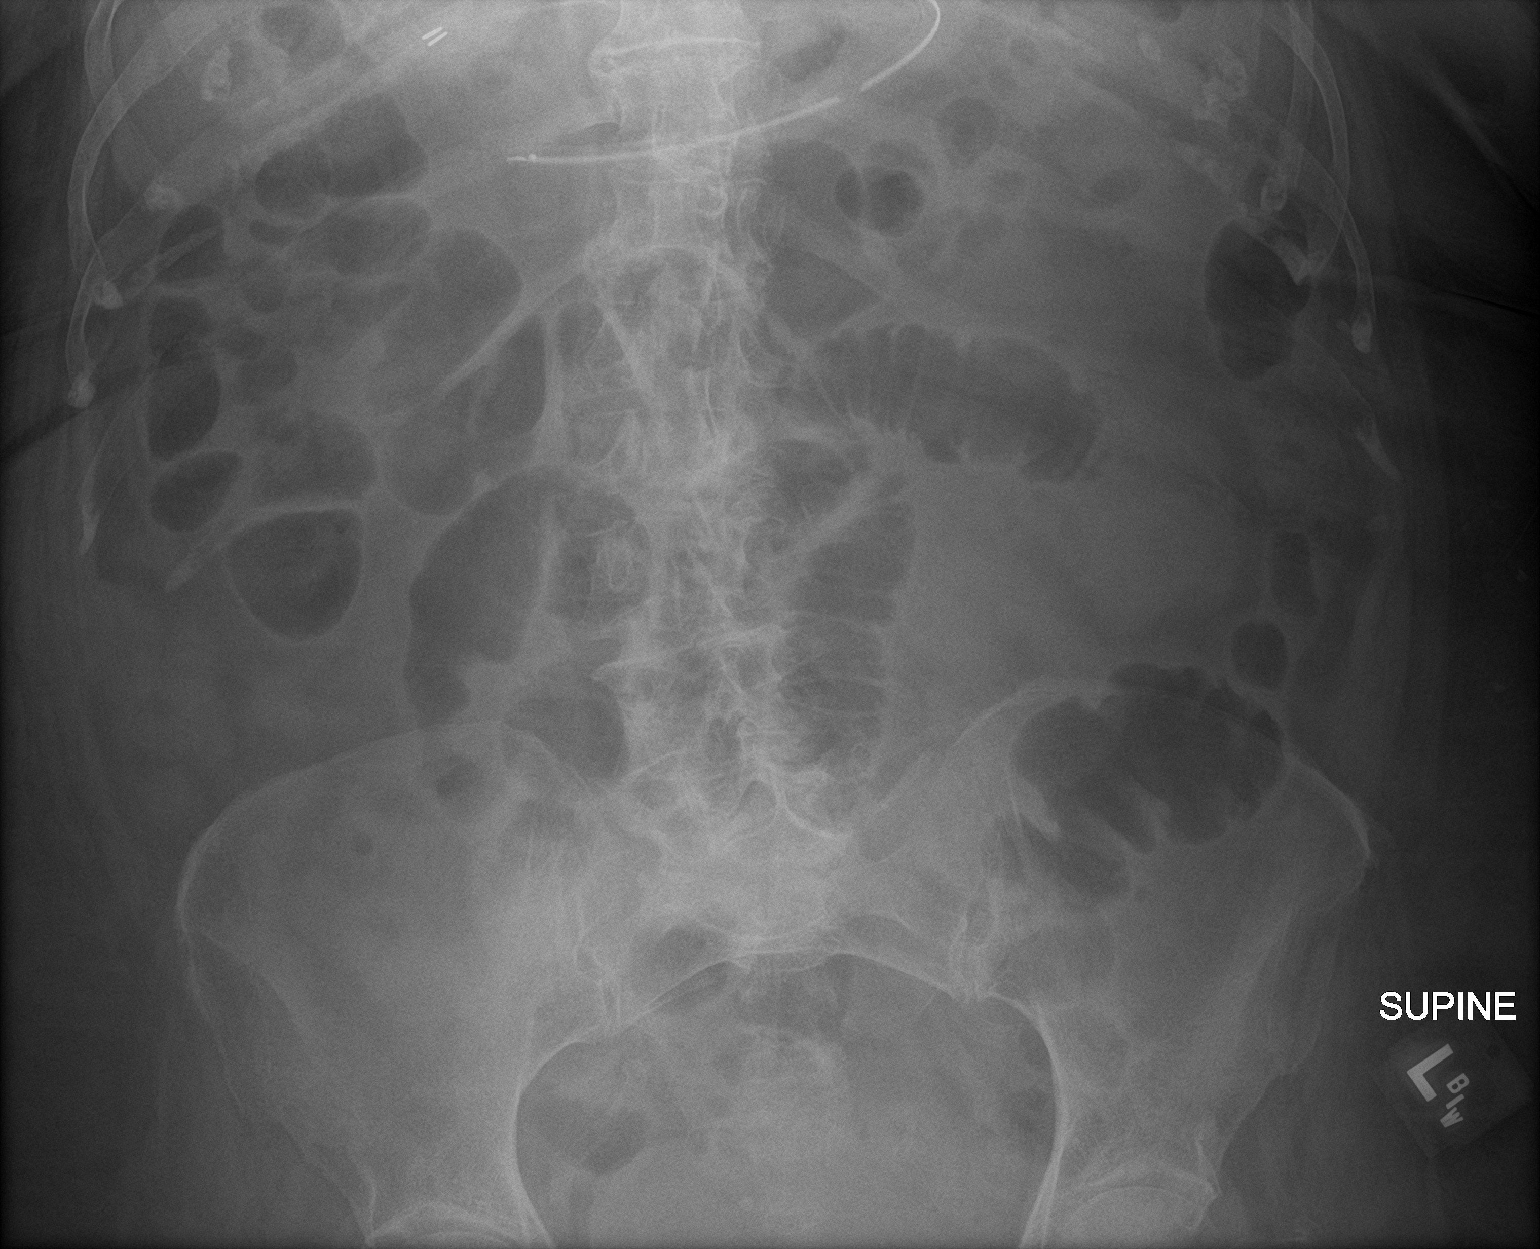

[abdomen kub (2 of 2)]
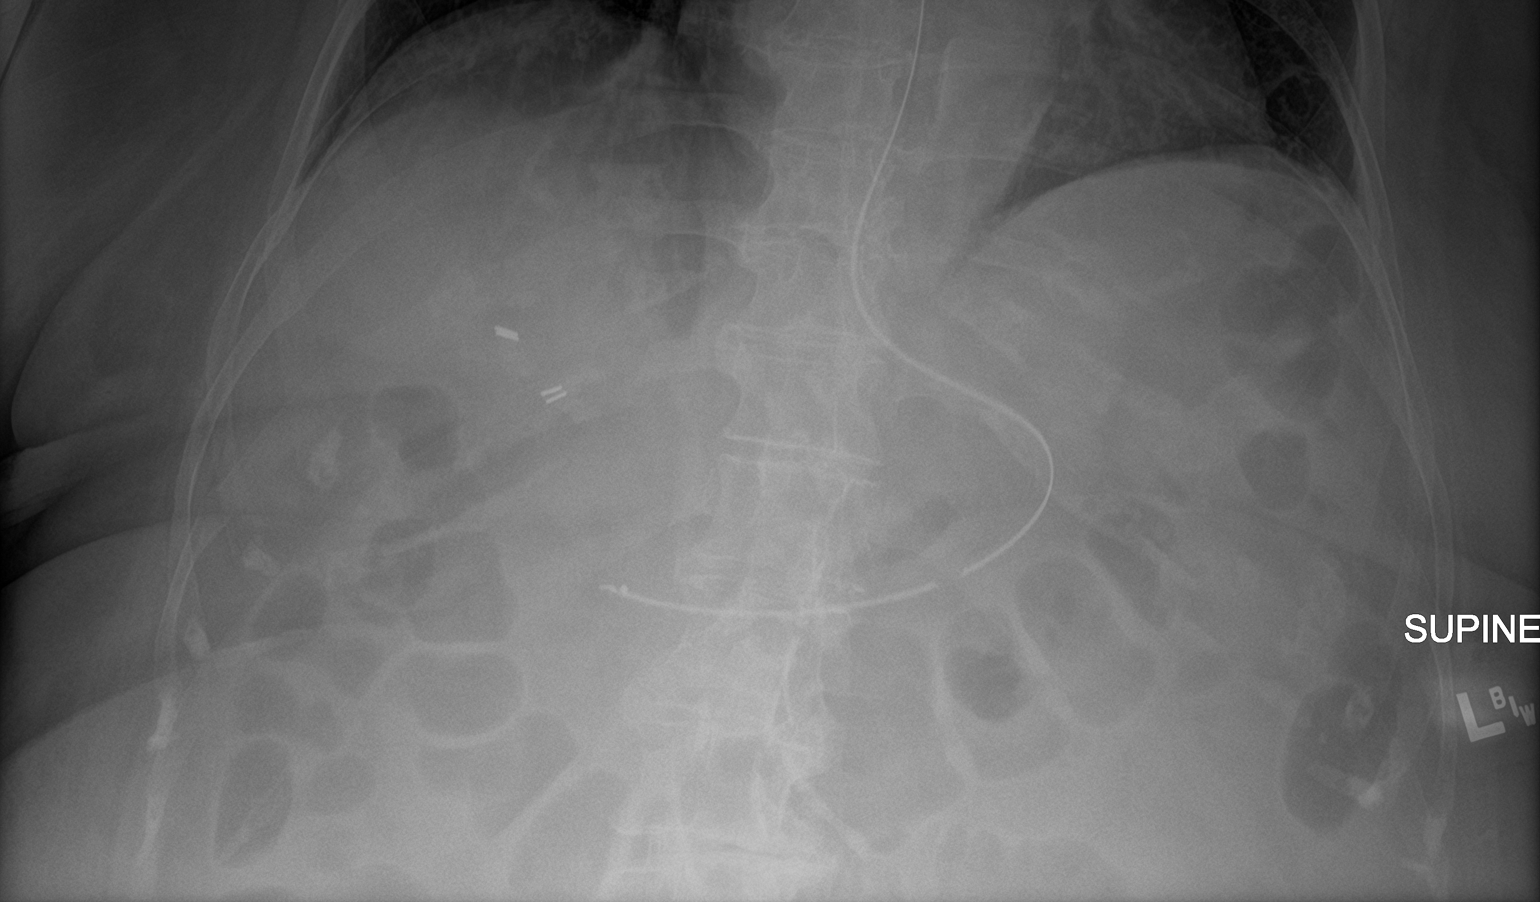

[2 of 2 positions shown; findings below may reference images not displayed]

FINDINGS: Nasogastric catheter is noted in satisfactory position. Scattered
large and small bowel air is noted. The overall appearance has
improved slightly from the prior exam although a few mildly
prominent loops of small bowel remain. No free air is noted.
IMPRESSION: Overall improvement in the bowel gas pattern although a few mildly
prominent small bowel loops remain.
